# Patient Record
Sex: Female | Born: 1945
Health system: Southern US, Community
[De-identification: ages and names within clinical notes are randomized; demographics above are authoritative.]

## PROBLEM LIST (undated history)

## (undated) DIAGNOSIS — M8580 Other specified disorders of bone density and structure, unspecified site: Secondary | ICD-10-CM

## (undated) DIAGNOSIS — E785 Hyperlipidemia, unspecified: Secondary | ICD-10-CM

## (undated) DIAGNOSIS — I10 Essential (primary) hypertension: Secondary | ICD-10-CM

## (undated) DIAGNOSIS — I639 Cerebral infarction, unspecified: Secondary | ICD-10-CM

## (undated) HISTORY — DX: Hyperlipidemia, unspecified: E78.5

## (undated) HISTORY — DX: Essential (primary) hypertension: I10

## (undated) HISTORY — DX: Other specified disorders of bone density and structure, unspecified site: M85.80

## (undated) HISTORY — DX: Cerebral infarction, unspecified: I63.9

---

## 2000-01-06 ENCOUNTER — Emergency Department (HOSPITAL_COMMUNITY): Admission: EM | Admit: 2000-01-06 | Discharge: 2000-01-06 | Payer: Self-pay | Admitting: Emergency Medicine

## 2000-01-06 ENCOUNTER — Encounter: Payer: Self-pay | Admitting: Emergency Medicine

## 2000-03-13 ENCOUNTER — Ambulatory Visit (HOSPITAL_COMMUNITY): Admission: RE | Admit: 2000-03-13 | Discharge: 2000-03-13 | Payer: Self-pay | Admitting: Orthopedic Surgery

## 2003-09-18 ENCOUNTER — Observation Stay (HOSPITAL_COMMUNITY): Admission: EM | Admit: 2003-09-18 | Discharge: 2003-09-18 | Payer: Self-pay

## 2005-10-09 ENCOUNTER — Emergency Department (HOSPITAL_COMMUNITY): Admission: EM | Admit: 2005-10-09 | Discharge: 2005-10-09 | Payer: Self-pay | Admitting: Family Medicine

## 2007-09-14 DIAGNOSIS — I639 Cerebral infarction, unspecified: Secondary | ICD-10-CM

## 2007-09-14 HISTORY — DX: Cerebral infarction, unspecified: I63.9

## 2007-10-02 ENCOUNTER — Inpatient Hospital Stay (HOSPITAL_COMMUNITY): Admission: EM | Admit: 2007-10-02 | Discharge: 2007-10-07 | Payer: Self-pay | Admitting: Emergency Medicine

## 2007-10-05 ENCOUNTER — Ambulatory Visit: Payer: Self-pay | Admitting: *Deleted

## 2007-10-05 ENCOUNTER — Ambulatory Visit: Payer: Self-pay | Admitting: Physical Medicine & Rehabilitation

## 2007-10-05 ENCOUNTER — Encounter (INDEPENDENT_AMBULATORY_CARE_PROVIDER_SITE_OTHER): Payer: Self-pay | Admitting: Internal Medicine

## 2007-10-07 ENCOUNTER — Inpatient Hospital Stay (HOSPITAL_COMMUNITY)
Admission: RE | Admit: 2007-10-07 | Discharge: 2007-10-20 | Payer: Self-pay | Admitting: Physical Medicine & Rehabilitation

## 2007-10-07 ENCOUNTER — Ambulatory Visit: Payer: Self-pay | Admitting: Physical Medicine & Rehabilitation

## 2007-10-26 ENCOUNTER — Encounter
Admission: RE | Admit: 2007-10-26 | Discharge: 2007-12-24 | Payer: Self-pay | Admitting: Physical Medicine & Rehabilitation

## 2007-11-24 ENCOUNTER — Ambulatory Visit: Payer: Self-pay | Admitting: Family Medicine

## 2007-11-24 DIAGNOSIS — Z8679 Personal history of other diseases of the circulatory system: Secondary | ICD-10-CM | POA: Insufficient documentation

## 2007-11-24 DIAGNOSIS — I10 Essential (primary) hypertension: Secondary | ICD-10-CM | POA: Insufficient documentation

## 2007-11-24 DIAGNOSIS — E785 Hyperlipidemia, unspecified: Secondary | ICD-10-CM | POA: Insufficient documentation

## 2007-11-30 ENCOUNTER — Encounter: Payer: Self-pay | Admitting: Family Medicine

## 2007-11-30 ENCOUNTER — Encounter
Admission: RE | Admit: 2007-11-30 | Discharge: 2007-11-30 | Payer: Self-pay | Admitting: Physical Medicine & Rehabilitation

## 2007-11-30 ENCOUNTER — Ambulatory Visit: Payer: Self-pay | Admitting: Physical Medicine & Rehabilitation

## 2008-01-26 ENCOUNTER — Telehealth: Payer: Self-pay | Admitting: Family Medicine

## 2008-02-01 ENCOUNTER — Ambulatory Visit: Payer: Self-pay | Admitting: Family Medicine

## 2008-02-05 ENCOUNTER — Telehealth: Payer: Self-pay | Admitting: Family Medicine

## 2008-03-22 ENCOUNTER — Ambulatory Visit: Payer: Self-pay | Admitting: Family Medicine

## 2008-03-24 ENCOUNTER — Telehealth: Payer: Self-pay | Admitting: Family Medicine

## 2008-03-24 LAB — CONVERTED CEMR LAB
ALT: 14 units/L (ref 0–35)
BUN: 9 mg/dL (ref 6–23)
Bilirubin, Direct: 0.1 mg/dL (ref 0.0–0.3)
Chloride: 98 meq/L (ref 96–112)
Creatinine, Ser: 0.8 mg/dL (ref 0.4–1.2)
GFR calc Af Amer: 94 mL/min
GFR calc non Af Amer: 78 mL/min
Glucose, Bld: 103 mg/dL — ABNORMAL HIGH (ref 70–99)
Potassium: 4 meq/L (ref 3.5–5.1)
Sodium: 136 meq/L (ref 135–145)
TSH: 3.22 microintl units/mL (ref 0.35–5.50)
Total Protein: 7.2 g/dL (ref 6.0–8.3)

## 2008-04-05 ENCOUNTER — Ambulatory Visit: Payer: Self-pay | Admitting: Family Medicine

## 2008-04-05 LAB — CONVERTED CEMR LAB
Cholesterol: 167 mg/dL (ref 0–200)
HDL: 32.4 mg/dL — ABNORMAL LOW (ref >39.0)

## 2008-04-07 ENCOUNTER — Ambulatory Visit: Payer: Self-pay | Admitting: Family Medicine

## 2008-04-15 ENCOUNTER — Ambulatory Visit: Payer: Self-pay | Admitting: Family Medicine

## 2008-04-20 ENCOUNTER — Encounter: Payer: Self-pay | Admitting: Family Medicine

## 2008-05-12 ENCOUNTER — Ambulatory Visit: Payer: Self-pay | Admitting: Family Medicine

## 2008-05-12 DIAGNOSIS — IMO0001 Reserved for inherently not codable concepts without codable children: Secondary | ICD-10-CM | POA: Insufficient documentation

## 2008-06-16 ENCOUNTER — Ambulatory Visit: Payer: Self-pay | Admitting: Family Medicine

## 2008-06-16 DIAGNOSIS — R5381 Other malaise: Secondary | ICD-10-CM | POA: Insufficient documentation

## 2008-06-16 DIAGNOSIS — R5383 Other fatigue: Secondary | ICD-10-CM | POA: Insufficient documentation

## 2008-06-17 LAB — CONVERTED CEMR LAB
Basophils Absolute: 0 10*3/uL (ref 0.0–0.1)
Basophils Relative: 0.4 % (ref 0.0–3.0)
Eosinophils Absolute: 0.2 10*3/uL (ref 0.0–0.7)
Hemoglobin: 13.3 g/dL (ref 12.0–15.0)
MCHC: 34.9 g/dL (ref 30.0–36.0)
Monocytes Relative: 6 % (ref 3.0–12.0)
Neutro Abs: 6.4 10*3/uL (ref 1.4–7.7)

## 2008-07-28 ENCOUNTER — Ambulatory Visit: Payer: Self-pay | Admitting: Family Medicine

## 2008-07-28 DIAGNOSIS — I491 Atrial premature depolarization: Secondary | ICD-10-CM | POA: Insufficient documentation

## 2008-12-19 ENCOUNTER — Ambulatory Visit: Payer: Self-pay | Admitting: Family Medicine

## 2009-01-11 ENCOUNTER — Telehealth (INDEPENDENT_AMBULATORY_CARE_PROVIDER_SITE_OTHER): Payer: Self-pay | Admitting: *Deleted

## 2009-07-05 ENCOUNTER — Ambulatory Visit: Payer: Self-pay | Admitting: Family Medicine

## 2009-07-10 LAB — CONVERTED CEMR LAB
AST: 22 units/L (ref 0–37)
Basophils Absolute: 0.1 10*3/uL (ref 0.0–0.1)
Basophils Relative: 0.8 % (ref 0.0–3.0)
CO2: 31 meq/L (ref 19–32)
Calcium: 9.3 mg/dL (ref 8.4–10.5)
Chloride: 103 meq/L (ref 96–112)
Eosinophils Relative: 3 % (ref 0.0–5.0)
Glucose, Bld: 94 mg/dL (ref 70–99)
HCT: 38.6 % (ref 36.0–46.0)
HDL: 35.7 mg/dL — ABNORMAL LOW (ref >39.00)
Hemoglobin: 13.3 g/dL (ref 12.0–15.0)
MCHC: 34.4 g/dL (ref 30.0–36.0)
MCV: 89.9 fL (ref 78.0–100.0)
Monocytes Absolute: 0.6 10*3/uL (ref 0.1–1.0)
Monocytes Relative: 6.8 % (ref 3.0–12.0)
Neutro Abs: 5.8 10*3/uL (ref 1.4–7.7)
Platelets: 371 10*3/uL (ref 150.0–400.0)
Potassium: 4 meq/L (ref 3.5–5.1)
RBC: 4.29 M/uL (ref 3.87–5.11)
Triglycerides: 77 mg/dL (ref 0.0–149.0)
VLDL: 15.4 mg/dL (ref 0.0–40.0)

## 2009-07-19 ENCOUNTER — Ambulatory Visit: Payer: Self-pay | Admitting: Internal Medicine

## 2009-07-19 ENCOUNTER — Encounter: Payer: Self-pay | Admitting: Family Medicine

## 2009-08-11 ENCOUNTER — Ambulatory Visit: Payer: Self-pay | Admitting: Family Medicine

## 2009-08-11 DIAGNOSIS — D179 Benign lipomatous neoplasm, unspecified: Secondary | ICD-10-CM | POA: Insufficient documentation

## 2009-08-11 DIAGNOSIS — M949 Disorder of cartilage, unspecified: Secondary | ICD-10-CM

## 2009-08-11 DIAGNOSIS — M899 Disorder of bone, unspecified: Secondary | ICD-10-CM | POA: Insufficient documentation

## 2009-09-18 ENCOUNTER — Telehealth: Payer: Self-pay | Admitting: Family Medicine

## 2009-11-25 ENCOUNTER — Emergency Department (HOSPITAL_COMMUNITY): Admission: EM | Admit: 2009-11-25 | Discharge: 2009-11-25 | Payer: Self-pay | Admitting: Family Medicine

## 2010-02-01 ENCOUNTER — Ambulatory Visit: Payer: Self-pay | Admitting: Internal Medicine

## 2010-02-01 DIAGNOSIS — R42 Dizziness and giddiness: Secondary | ICD-10-CM | POA: Insufficient documentation

## 2010-05-08 ENCOUNTER — Ambulatory Visit: Payer: Self-pay | Admitting: Family Medicine

## 2010-05-08 DIAGNOSIS — J309 Allergic rhinitis, unspecified: Secondary | ICD-10-CM | POA: Insufficient documentation

## 2010-09-13 ENCOUNTER — Telehealth: Payer: Self-pay | Admitting: Family Medicine

## 2010-09-14 ENCOUNTER — Ambulatory Visit: Payer: Self-pay | Admitting: Family Medicine

## 2010-11-13 NOTE — Assessment & Plan Note (Signed)
Summary: congestion//ccm   Vital Signs:  Patient profile:   65 year old female Weight:      172 pounds O2 Sat:      97 % Temp:     98 degrees F Pulse rate:   65 / minute BP sitting:   140 / 82  Vitals Entered By: Pura Spice, RN (September 14, 2010 9:45 AM) CC: cough congeston yellow sputum states notices wheezing at nite   History of Present Illness: Here for one week of PND, chest congestion, and coughing up yellow sputum. No fever.   Allergies: 1)  ! Iodine  Past History:  Past Medical History: Reviewed history from 08/11/2009 and no changes required. Hyperlipidemia Hypertension Cerebrovascular accident, hx of, 12-08 leaving her with some left leg weakness, saw Dr. Vickey Huger fx to left lower leg, no surgery required osteopenia per DEXA 07-19-09  Review of Systems  The patient denies anorexia, fever, weight loss, weight gain, vision loss, decreased hearing, hoarseness, chest pain, syncope, dyspnea on exertion, peripheral edema, headaches, hemoptysis, abdominal pain, melena, hematochezia, severe indigestion/heartburn, hematuria, incontinence, genital sores, muscle weakness, suspicious skin lesions, transient blindness, difficulty walking, depression, unusual weight change, abnormal bleeding, enlarged lymph nodes, angioedema, breast masses, and testicular masses.    Physical Exam  General:  Well-developed,well-nourished,in no acute distress; alert,appropriate and cooperative throughout examination Head:  Normocephalic and atraumatic without obvious abnormalities. No apparent alopecia or balding. Eyes:  No corneal or conjunctival inflammation noted. EOMI. Perrla. Funduscopic exam benign, without hemorrhages, exudates or papilledema. Vision grossly normal. Ears:  External ear exam shows no significant lesions or deformities.  Otoscopic examination reveals clear canals, tympanic membranes are intact bilaterally without bulging, retraction, inflammation or discharge. Hearing is  grossly normal bilaterally. Nose:  External nasal examination shows no deformity or inflammation. Nasal mucosa are pink and moist without lesions or exudates. Mouth:  Oral mucosa and oropharynx without lesions or exudates.  Teeth in good repair. Neck:  No deformities, masses, or tenderness noted. Lungs:  Normal respiratory effort, chest expands symmetrically. Lungs are clear to auscultation, no crackles or wheezes.   Impression & Recommendations:  Problem # 1:  ACUTE BRONCHITIS (ICD-466.0)  Her updated medication list for this problem includes:    Zithromax Z-pak 250 Mg Tabs (Azithromycin) .Marland Kitchen... As directed    Hydromet 5-1.5 Mg/20ml Syrp (Hydrocodone-homatropine) .Marland Kitchen... 1 tsp q 4 hours as needed cough  Complete Medication List: 1)  Norvasc 5 Mg Tabs (Amlodipine besylate) .Marland Kitchen.. 1 by mouth once daily 2)  Aspir-low 81 Mg Tbec (Aspirin) .Marland Kitchen.. 1 once daily after meal 3)  Hydrochlorothiazide 12.5 Mg Tabs (Hydrochlorothiazide) .... Take 1 tab  by mouth every morning 4)  Potassium Chloride 20 Meq Pack (Potassium chloride) .Marland Kitchen.. 1 by mouth once daily 5)  Tenormin 50 Mg Tabs (Atenolol) .... Two times a day 6)  Zithromax Z-pak 250 Mg Tabs (Azithromycin) .... As directed 7)  Hydromet 5-1.5 Mg/25ml Syrp (Hydrocodone-homatropine) .Marland Kitchen.. 1 tsp q 4 hours as needed cough  Patient Instructions: 1)  Please schedule a follow-up appointment as needed .  Prescriptions: HYDROMET 5-1.5 MG/5ML SYRP (HYDROCODONE-HOMATROPINE) 1 tsp q 4 hours as needed cough  #240 x 0   Entered and Authorized by:   Nelwyn Salisbury MD   Signed by:   Nelwyn Salisbury MD on 09/14/2010   Method used:   Print then Give to Patient   RxID:   [id number] ZITHROMAX Z-PAK 250 MG TABS (AZITHROMYCIN) as directed  #1 x 0   Entered and Authorized  by:   Nelwyn Salisbury MD   Signed by:   Nelwyn Salisbury MD on 09/14/2010   Method used:   Print then Give to Patient   RxID:   [id number]    Orders Added: 1)  Est. Patient Level IV [14782]

## 2010-11-13 NOTE — Assessment & Plan Note (Signed)
Summary: dizziness/dm   Vital Signs:  Patient profile:   65 year old female Weight:      169 pounds Temp:     98.3 degrees F oral BP sitting:   140 / 84  (right arm) Cuff size:   regular  Vitals Entered By: Duard Brady LPN (February 01, 2010 2:26 PM) CC: c/o dizziness x1 weeks - no fall no injury noted , c/o nausea but no vomiting Is Patient Diabetic? No   CC:  c/o dizziness x1 weeks - no fall no injury noted  and c/o nausea but no vomiting.  History of Present Illness: a 65 year old patient who presents with a one week history of dizziness.  Dizziness is aggravated by bending over and other movements, such as laying flat in bed.  She briefly has a sense of falling.  She denies any true vertigo.  Associated symptoms include nausea.  Also for the past week.  She has had some sinus congestion and rhinorrhea.  There is been no fever.  She denies any focal neurological symptoms such as dysarthria or diplopia  Preventive Screening-Counseling & Management  Alcohol-Tobacco     Smoking Status: never  Allergies: 1)  ! Iodine  Past History:  Past Medical History: Reviewed history from 08/11/2009 and no changes required. Hyperlipidemia Hypertension Cerebrovascular accident, hx of, 12-08 leaving her with some left leg weakness, saw Dr. Vickey Huger fx to left lower leg, no surgery required osteopenia per DEXA 07-19-09  Review of Systems  The patient denies anorexia, fever, weight loss, weight gain, vision loss, decreased hearing, hoarseness, chest pain, syncope, dyspnea on exertion, peripheral edema, prolonged cough, headaches, hemoptysis, abdominal pain, melena, hematochezia, severe indigestion/heartburn, hematuria, incontinence, genital sores, muscle weakness, suspicious skin lesions, transient blindness, difficulty walking, depression, unusual weight change, abnormal bleeding, enlarged lymph nodes, angioedema, and breast masses.    Physical Exam  General:   Well-developed,well-nourished,in no acute distress; alert,appropriate and cooperative throughout examination Head:  Normocephalic and atraumatic without obvious abnormalities. No apparent alopecia or balding. Eyes:  No corneal or conjunctival inflammation noted. EOMI. Perrla. Funduscopic exam benign, without hemorrhages, exudates or papilledema. Vision grossly normal. Ears:  External ear exam shows no significant lesions or deformities.  Otoscopic examination reveals clear canals, tympanic membranes are intact bilaterally without bulging, retraction, inflammation or discharge. Hearing is grossly normal bilaterally. Mouth:  Oral mucosa and oropharynx without lesions or exudates.  Teeth in good repair. Neck:  No deformities, masses, or tenderness noted. Heart:  Normal rate and regular rhythm. S1 and S2 normal without gallop, murmur, click, rub or other extra sounds. Neurologic:  alert & oriented X3, cranial nerves II-XII intact, gait normal, and finger-to-nose normal.     Impression & Recommendations:  Problem # 1:  DIZZINESS (ICD-780.4) sounds most consistent with some mild vertigo.  Will treat with Clarinex, and clinically observed  Problem # 2:  HYPERTENSION (ICD-401.9)  Her updated medication list for this problem includes:    Norvasc 5 Mg Tabs (Amlodipine besylate) .Marland Kitchen... 1 by mouth once daily    Hydrochlorothiazide 12.5 Mg Tabs (Hydrochlorothiazide) .Marland Kitchen... Take 1 tab  by mouth every morning    Tenormin 50 Mg Tabs (Atenolol) .Marland Kitchen..Marland Kitchen Two times a day  Complete Medication List: 1)  Norvasc 5 Mg Tabs (Amlodipine besylate) .Marland Kitchen.. 1 by mouth once daily 2)  Aspir-low 81 Mg Tbec (Aspirin) .Marland Kitchen.. 1 once daily after meal 3)  Hydrochlorothiazide 12.5 Mg Tabs (Hydrochlorothiazide) .... Take 1 tab  by mouth every morning 4)  Potassium Chloride 20 Meq Pack (  Potassium chloride) .Marland Kitchen.. 1 by mouth once daily 5)  Tenormin 50 Mg Tabs (Atenolol) .... Two times a day 6)  Flexeril 10 Mg Tabs (Cyclobenzaprine hcl)  .... Three times a day as needed spasm 7)  Diclofenac Sodium 50 Mg Tbec (Diclofenac sodium) .... Three times a day as needed pain  Patient Instructions: 1)  Please schedule a follow-up appointment as needed. 2)  Limit your Sodium (Salt) to less than 2 grams a day(slightly less than 1/2 a teaspoon) to prevent fluid retention, swelling, or worsening of symptoms. 3)  It is important that you exercise regularly at least 20 minutes 5 times a week. If you develop chest pain, have severe difficulty breathing, or feel very tired , stop exercising immediately and seek medical attention. 4)  Check your Blood Pressure regularly. If it is above: 150/90 you should make an appointment.

## 2010-11-13 NOTE — Assessment & Plan Note (Signed)
Summary: throat drainage especially at night/loss of appetite/some nau...   Vital Signs:  Patient profile:   65 year old female Weight:      170 pounds BMI:     29.29 Temp:     98.0 degrees F oral BP sitting:   130 / 74  (left arm) Cuff size:   regular  Vitals Entered By: Raechel Ache, RN (May 08, 2010 3:48 PM) CC: C/o post-nasal drip since May.   History of Present Illness: Here for 2 months of intermittent PND and a scratchy sensation in the back of the throat. No fever or HA or cough. This is similar to the symtpoms she saw [place] for in April of this year. At that time she responded well to samples for Clarinex. She took 10 days worth and then stopped.   Allergies: 1)  ! Iodine  Past History:  Past Medical History: Reviewed history from 08/11/2009 and no changes required. Hyperlipidemia Hypertension Cerebrovascular accident, hx of, 12-08 leaving her with some left leg weakness, saw Dr. Vickey Huger fx to left lower leg, no surgery required osteopenia per DEXA 07-19-09  Review of Systems  The patient denies anorexia, fever, weight loss, weight gain, vision loss, decreased hearing, hoarseness, chest pain, syncope, dyspnea on exertion, peripheral edema, prolonged cough, headaches, hemoptysis, abdominal pain, melena, hematochezia, severe indigestion/heartburn, hematuria, incontinence, genital sores, muscle weakness, suspicious skin lesions, transient blindness, difficulty walking, depression, unusual weight change, abnormal bleeding, enlarged lymph nodes, angioedema, breast masses, and testicular masses.    Physical Exam  General:  Well-developed,well-nourished,in no acute distress; alert,appropriate and cooperative throughout examination Head:  Normocephalic and atraumatic without obvious abnormalities. No apparent alopecia or balding. Eyes:  No corneal or conjunctival inflammation noted. EOMI. Perrla. Funduscopic exam benign, without hemorrhages, exudates or papilledema. Vision  grossly normal. Ears:  External ear exam shows no significant lesions or deformities.  Otoscopic examination reveals clear canals, tympanic membranes are intact bilaterally without bulging, retraction, inflammation or discharge. Hearing is grossly normal bilaterally. Nose:  External nasal examination shows no deformity or inflammation. Nasal mucosa are pink and moist without lesions or exudates. Mouth:  Oral mucosa and oropharynx without lesions or exudates.  Teeth in good repair. Neck:  No deformities, masses, or tenderness noted. Lungs:  Normal respiratory effort, chest expands symmetrically. Lungs are clear to auscultation, no crackles or wheezes.   Impression & Recommendations:  Problem # 1:  ALLERGIC RHINITIS (ICD-477.9)  Complete Medication List: 1)  Norvasc 5 Mg Tabs (Amlodipine besylate) .Marland Kitchen.. 1 by mouth once daily 2)  Aspir-low 81 Mg Tbec (Aspirin) .Marland Kitchen.. 1 once daily after meal 3)  Hydrochlorothiazide 12.5 Mg Tabs (Hydrochlorothiazide) .... Take 1 tab  by mouth every morning 4)  Potassium Chloride 20 Meq Pack (Potassium chloride) .Marland Kitchen.. 1 by mouth once daily 5)  Tenormin 50 Mg Tabs (Atenolol) .... Two times a day 6)  Flexeril 10 Mg Tabs (Cyclobenzaprine hcl) .... Three times a day as needed spasm 7)  Diclofenac Sodium 50 Mg Tbec (Diclofenac sodium) .... Three times a day as needed pain  Patient Instructions: 1)  Try Claritin or Zyrtec OTC.  2)  Please schedule a follow-up appointment as needed .

## 2010-11-13 NOTE — Progress Notes (Signed)
  Phone Note Call from Patient   Caller: Patient Call For: Nelwyn Salisbury MD Summary of Call: [phone] [facility] Pt is having URI symptoms and post nasal drainage. No fever or productive cough.  Wants RX. Initial call taken by: Lynann Beaver CMA AAMA,  September 13, 2010 9:23 AM  Follow-up for Phone Call        needs an OV  Follow-up by: Nelwyn Salisbury MD,  September 13, 2010 10:12 AM  Additional Follow-up for Phone Call Additional follow up Details #1::        Left message to make appt. Additional Follow-up by: Lynann Beaver CMA AAMA,  September 13, 2010 10:28 AM    Additional Follow-up for Phone Call Additional follow up Details #2::    Pt made appt tomorrow with Dr. Clent Ridges. Follow-up by: Lynann Beaver CMA AAMA,  September 13, 2010 12:57 PM

## 2010-12-21 ENCOUNTER — Other Ambulatory Visit: Payer: Self-pay | Admitting: Family Medicine

## 2011-01-17 ENCOUNTER — Telehealth: Payer: Self-pay | Admitting: *Deleted

## 2011-01-17 NOTE — Telephone Encounter (Signed)
agreed

## 2011-01-17 NOTE — Telephone Encounter (Signed)
Pt was taking Claritin and it was helping her post nasal drip.  Allergic to vanilla fragrance, and was exposed to vanilla with headache.  Advised to continue Claritin since she has no reason to believe there is any reaction to Claritin.  Will call back with any unusual symptoms.

## 2011-02-26 NOTE — Consult Note (Signed)
NAMEJHADA, RISK              ACCOUNT NO.:  [id number]   MEDICAL RECORD NO.:  [id number]          PATIENT TYPE:  EMS   LOCATION:  [facility]                         FACILITY:  [facility]   PHYSICIAN:  Melvyn Novas, M.D.  DATE OF BIRTH:  [birth year]   DATE OF CONSULTATION:  DATE OF DISCHARGE:                                 CONSULTATION   REASON FOR CONSULTATION:  Dizziness.   HISTORY OF PRESENT ILLNESS:  Ms. Spells is a 65 year old woman who is a  previous patient of Dr. Vickey Huger.  She has received very little primary  care over the past few years.  She currently has poorly-controlled  hypertension.  She presents to the emergency room today with a history  of nausea and vomiting for the past 2 days.  She also has had several  episodes of diarrhea.  She states that this morning after returning from  the toilet that she had an episode of dizziness and felt as if she were  going to fall.  She was staggering.  She did not have any visual  changes.  She did not have any loss of consciousness.  Her daughters are  currently at the bedside.  Of particular note, Ms. Wassel has been seen  on 2 other occasions, each in December over the past 5 years.  Usually  she comes in with vague neurological complaints.  She does not have a  formal diagnosis of depression.   PAST MEDICAL HISTORY:  1. Hypertension.  2. Medical noncompliance.   SOCIAL HISTORY:  Nondrinker, nonsmoker.   FAMILY HISTORY:  Positive for hypertension.   MEDICATIONS:  None.   REVIEW OF SYSTEMS:  Negative except for what is mentioned in the HPI.  Fever, chills for 3 days, diarrhea for 24 hours.  Did not take  antihypertensive and has not been taking them for several months to  years.  She now has systolic blood pressure of 196/110.  This lady is  not neurologically ill.   VITAL SIGNS:  Blood pressure 196/110, heart rate 96, normal  respirations.  O2 saturations 100% on room air.   LABORATORIES:  White blood cell count  elevated at 14.4, hemoglobin 14.4,  platelets 385.  Potassium decreased at 3.3, consistent with her  diarrheal illness.  UA was negative for infection, however did have  significant proteinuria secondary to her hypertension.   PHYSICAL EXAM:  The patient is alert but depressed-appearing.  Her  daughters are at the bedside.  She has no gross neurological  abnormalities.  No motor deficits.  Cranial nerves normal.  No sensory  deficits.  Deep tendon reflexes are hyperreflexic.  Downgoing toes.  Heel-to-shin normal.  Finger-to-nose bilaterally intact.   ASSESSMENT:  No neurological illness.  The patient has orthostatic  changes consistent with her fluid loss secondary to gastroenteritis with  a concurrent hypokalemia.  Her blood pressure is increased even in the  setting of decreased volume status.  She is a severely hypertensive  patient, which needs treatment at this time.  Please consult internal  medicine for further management.      Edsel Petrin, D.O.  Electronically Signed      Melvyn Novas, M.D.  Electronically Signed    ELG/MEDQ  D:  10/02/2007  T:  10/02/2007  Job:  [id number]

## 2011-02-26 NOTE — Discharge Summary (Signed)
NAMEJAMES, LAFALCE NO.:  [id number]   MEDICAL RECORD NO.:  [id number]          PATIENT TYPE:  INP   LOCATION:  3033                         FACILITY:  [facility]   PHYSICIAN:  Isidor Holts, M.D.  DATE OF BIRTH:  26-Jun-1946   DATE OF ADMISSION:  10/02/2007  DATE OF DISCHARGE:  10/07/2007                               DISCHARGE SUMMARY   ADDENDUM:  For Discharge Diagnoses, Procedures, Consultations,  Admission History, and Clinical Course, refer to Interim Summary  dictated October 06, 2007, by Dr. Elliot Cousin.   In addition, on October 07, 2007, there no new issues.  The patient was  comfortable, asymptomatic.  Physical examination showed no new physical  findings.  Blood pressure was well controlled at 107/79, and the patient  continues on a statin  for dyslipidemia.  The left ear inflammation is  improving steadily clinically, and the patient is due to complete  Augmentin on October 11, 2007.  The patient's hypokalemia is likely  secondary to hydrochlorothiazide,  and she continues on potassium  chloride supplements. Serum potassium was 3.6 on October 07, 2007.  The  patient has been seen by the rehab team and confirmed for transfer to  inpatient rehabilitation on October 07, 2007.  She has, therefore, been  discharged accordingly from the medical service.      Isidor Holts, M.D.  Electronically Signed     CO/MEDQ  D:  10/07/2007  T:  10/07/2007  Job:  [id number]

## 2011-02-26 NOTE — Discharge Summary (Signed)
Anne [name], BETSILL NO.:  [id number]   MEDICAL RECORD NO.:  [id number]          PATIENT TYPE:  INP   LOCATION:  3033                         FACILITY:  [facility]   PHYSICIAN:  Elliot Cousin, M.D.    DATE OF BIRTH:  26-Apr-1946   DATE OF ADMISSION:  10/01/2007  DATE OF DISCHARGE:                               DISCHARGE SUMMARY   Anticipated date of discharge October 07, 2007.   DISCHARGE DIAGNOSES:  1. Acute right brain stroke (acute infarct in the right lateral      thalamus and posterior limb of the internal capsule per MRI of the      brain on October 02, 2007).  2. Left-sided hemiparesis secondary to above.  3. Malignant hypertension secondary to noncompliance.  4. Chronic hypokalemia.  5. Transient hyperglycemia.  6. Mild left external ear canal cellulitis.  7. Abnormal EKG changes.  8. Hyperlipidemia.   DISCHARGE MEDICATIONS:  1. Potassium chloride 20 mEq b.i.d.  2. Hydrochlorothiazide 12.5 mg daily.  3. Toprol-XL 50 mg daily.  4. Norvasc 5 mg nightly.  5. Augmentin 500 mg b.i.d. for a total of 7 days.  6. Aspirin 81 mg daily.  7. Zocor 20 mEq nightly.   CONSULTATIONS:  1. Melvyn Novas, MD.  2. Dr. Thersa Salt.  3. Physical and occupational therapists.   PROCEDURES PERFORMED:  1. MRI of the brain on October 02, 2007.  The results are above.  2. MRA of the head, negative.  3. CT scan of the head without contrast on October 02, 2007.  The      results revealed no CT evidence of acute infarction.  Focal lacunar-      type infarctions in the right basal ganglia and white matter are      age-indeterminate, but new compared to September 18, 2003.  4. 2-D echocardiogram  performed on October 05, 2007.  The results      revealed overall left ventricular systolic function was normal.      Ejection fraction estimated to be 60% to 65%.  No evidence of left      ventricular regional wall motion abnormalities.  There was mild      focal basal septal  hypertrophy.  Mild aortic valvular      regurgitation.  No diagnostic echocardiographic evidence for a      cardiac source of embolism.  5. Carotid Doppler study performed on October 05, 2007.  The results      revealed mild noncalcific plaque at the proximal internal carotid      artery, vertebral artery flow antegrade bilaterally.  No      significant right internal carotid artery stenosis.  No significant      left internal carotid artery stenosis.   HISTORY OF PRESENT ILLNESS:  The patient is a 65 year old woman with a  past medical history significant for hypertension, who presented to the  emergency department on October 02, 2007 with a chief complaint of  nausea, dizziness, and weakness on the left side.  She was initially  evaluated in the emergency department by neurologist Dr. Vickey Huger.  Dr.  Dohmeier evaluated the patient and felt that her symptoms were secondary  to a gastroenteritis with concurrent hypokalemia.  She believed that  there was no neurological illness.  A CT scan of the head was performed  in the emergency department, and revealed no evidence for an acute  infarction, however, there was evidence of lacunar-type infarctions in  the right basal ganglia that were age indeterminate, but new compared to  the CT scan performed September 18, 2003.  During the evaluation in the  emergency department, the patient was noted to be very hypertensive with  a blood pressure of 215/127.  The patient was, therefore, admitted for  further evaluation and management.   For additional details, please see the dictated history and physical.   HOSPITAL COURSE:  1. ACUTE RIGHT BRAIN STROKE WITH LEFT-SIDED HEMIPARESIS.  The patient      was started on an aspirin daily.  Measures were taken to control      her blood pressure.  For further evaluation, an MRI of the brain,      MRA of the head, 2-D echocardiogram, and a carotid Doppler study      were ordered.  As indicated above, the  MRI of the brain revealed an      acute infarction in the right lateral thalamus and posterior limb      of the internal capsule.  The echocardiogram revealed an ejection      fraction ranging from 60% to 65%.  The carotid Doppler study      revealed no ICA stenosis.  The patient underwent a bedside swallow      evaluation by the nursing staff, and apparently she passed it.      During my exam, the patient did have an intact gag reflex, and no      obvious cranial nerve deficit.  She was, however, hemiparetic on      the left, per my assessment initially.  Over the course of the      hospitalization, she has become stronger, however, greater of the      left upper extremity more than the left lower extremity.  The      physical and occupational therapists were consulted.  Per their      assessment, the patient would be a good candidate for      rehabilitation at [facility].  Therefore, Dr. Thersa Salt was      consulted.  He agreed that the patient would benefit from short-      term rehabilitation.  Currently, the patient is stable.  Rehab      admission is pending because of the wait for insurance approval.      If the patient's insurance does not pay for the rehab admission at      [facility], the alternative would be for the patient to go      to a short-term nursing facility for rehab and/or for the patient      to be discharged to home with home health therapy and family      assistance.  2. MALIGNANT HYPERTENSION.  The patient was diagnosed with      hypertension several years ago, however, she admittedly has been      noncompliant.  During her stay in the emergency department, her      systolic blood pressure reached a high of 215, and her diastolic      blood pressure reached a high of 127.  The  emergency department      physician ordered 20 mg of labetalol, which did improve her blood      pressure a little.  Her EKG at the time of the initial hospital       assessment revealed a left anterior fascicular block, LVH, right      atrial enlargement, and a prolonged Q-T interval.  The patient was      started on treatment with hydrochlorothiazide, Toprol-XL and      Norvasc.  Clonidine was added p.r.n. as well as intravenous      hydralazine for systolic blood pressures greater than 190, and      diastolic blood pressures greater than 100.  Cardiac enzymes were      ordered and were within normal limits.  Her TSH was assessed and      was also within normal limits at 2.29.  A followup EKG after her      blood pressures became more controlled revealed normal sinus rhythm      with PACs, left axis deviation, and LVH.  For the previous      prolonged Q-T interval, a magnesium level was assessed and found to      be within normal limits at 2.1.  Over the past few days, the      patient's blood pressure has improved significantly.  She will be      maintained on the current regimen with the exception of  decreasing      the hydrochlorothiazide to 12.5 mg daily.  The patient was      encouraged to start becoming compliant with antihypertensive      medications.  3. HYPERLIPIDEMIA.  The patient's fasting lipid panel was assessed,      and the results were as follows:  total cholesterol 226,      triglycerides 132, HDL 38, and LDL 162.  The patient was started on      Zocor 20 mg nightly.  4. TRANSIENT HYPERGLYCEMIA.  At the time of the initial hospital      assessment, the patient's venous glucose was 117.  However, the      followup glucose increased to 157.  It is unclear whether or not      the venous glucose was fasting or after eating.  Nonetheless, a      hemoglobin A1c was ordered and found to be within normal limits at      6.  Over the course of the hospitalization, her venous glucose      normalized.  As of today, it is 113.  5. HYPOKALEMIA.  The patient's serum potassium was 3.3 on admission.      She was repleted with potassium chloride  orally.  Her magnesium      level was assessed and found to be within normal limits.  Over the      course of the hospitalization, the patient's serum potassium      remained at 3.3.  In part, the hypokalemia has been secondary to      hydrochlorothiazide therapy.  For this reason, the patient will be      started on b.i.d. dosing of potassium chloride.  6. MILD LEFT EXTERNAL EAR CANAL CELLULITIS.  The patient's white blood      cell count on admission was 14.4.  Her chest x-ray revealed no      acute disease, and her urinalysis revealed no white blood cells.      She did  complain of a left earache.  On exam, there were no obvious      tympanic membrane abnormalities, however, there were some      cellulitic changes of the external canal.  For this reason,      Augmentin at 500 mg b.i.d. was started yesterday.  As of today, her      white blood cell count has improved to 11.7.  She has been      completely afebrile during the hospital course.   DISCHARGE DISPOSITION:  The patient is currently stable and in improved  condition.  Discharge to the rehab unit at [facility] is  currently pending.  If her insurance does not approve the admission to  the rehab unit at [facility], the patient may require short-  term skilled nursing facility placement.      Elliot Cousin, M.D.  Electronically Signed     DF/MEDQ  D:  10/06/2007  T:  10/06/2007  Job:  [id number]

## 2011-02-26 NOTE — H&P (Signed)
NAMEAUBRIEL, Anne [name]              ACCOUNT NO.:  [id number]   MEDICAL RECORD NO.:  [id number]          PATIENT TYPE:  INP   LOCATION:  1830                         FACILITY:  [facility]   PHYSICIAN:  Elliot Cousin, M.D.    DATE OF BIRTH:  Mar 16, 1946   DATE OF ADMISSION:  10/02/2007  DATE OF DISCHARGE:                              HISTORY & PHYSICAL   PRIMARY CARE PHYSICIAN:  The patient is unassigned.   CHIEF COMPLAINT:  Nausea, dizziness, weakness on the left side.   HISTORY OF PRESENT ILLNESS:  The patient is a 65 year old woman with a  past medical history significant for hypertension, who presents to the  emergency department with a chief complaint of nausea, dizziness, and  weakness on the left side.  She woke up this morning at approximately 6  a.m. nauseated.  When she proceeded to get up to go to the bathroom, she  became very dizzy.  She acknowledges that she almost fell, however, she  did not.  The dizziness lasted for several hours.  She denies outright  vertigo; however, she feels off balance when she stands.  She has also  associated left-sided weakness particularly of her left leg.  She says  that her left leg is weaker than her left arm.  She is right-handed and  denies any right-sided symptoms.  She denies headache, visual changes,  difficulty speaking, difficulty swallowing, difficulty chewing, or  facial droop.  Her sisters are present and acknowledge that they have  not observed slurred speech, facial droop, or mental status changes.  The patient also states that she does have a numbness type feeling over  the left side of her body.  She has also had some transient diarrhea  twice.  She has had nausea but no vomiting.  She denies subjective fever  or chills.  She denies chest pain, shortness of breath or cough.  She  denies painful urination.  She denies pain in her legs or unusual  swelling in her legs.  She acknowledges that she has a history of  hypertension for  many years; however, she has not taken any  antihypertensive medications in at least 4 or 5 years.   During the evaluation in the emergency department, the patient is noted  to be hypertensive with a blood pressure of 215/127.  Neurologist, Dr.  Vickey Huger, evaluated the patient and, per her assessment, there is no  neurological illness.  Dr. Vickey Huger believes that the patient has had  orthostatic changes consistent with fluid loss secondary to a  gastroenteritis with concurrent hypokalemia.  The patient's CT scan of  the head reveals evidence of lacunar type infarctions at the right basal  ganglia, new from 2004; however, there are no acute intracranial  findings.  Her EKG reveals left anterior fascicular block, LVH, right  atrial enlargement, and prolonged QT interval of 408/513.  The patient  will be admitted for further evaluation and management.   PAST MEDICAL HISTORY:  1. Hypertension.  The patient has been noncompliant with medications.      She has not seen a primary care physician in several  years.  2. Status post myomectomy in 1985.  3. REPORTED ALLERGY OF LISINOPRIL PER DATA FROM 2004.  THE PATIENT      DOES NOT RECALL BEING ALLERGIC TO LISINOPRIL.  4. SHELLFISH/IODINE ALLERGY.   MEDICATIONS:  Centrum multivitamin once daily.   ALLERGIES:  THE PATIENT HAS ALLERGIES TO:  1. SHELLFISH.  2. IODINE PRODUCTS.  3. POSSIBLY LISINOPRIL.   SOCIAL HISTORY:  1. The patient is single.  2. [place]  3. She has no children.  4. She is employed as a Human resources officer at [employer].  5. She denies tobacco and illicit drug use.  6. She does rarely drink alcohol socially.   FAMILY HISTORY:  1. Her mother died of congestive heart failure at 64 years of age.  2. Her father died of bone cancer at 53 years of age.  3. She has several sisters, one of which has hypertension.   REVIEW OF SYSTEMS:  As above in History of Present Illness.    EXAM:  Temperature 97, blood pressure 215/127, repeated at 193/105,  pulse 99, respiratory rate 22, oxygen saturation 95% on room air.  GENERAL:  The patient is a pleasant 65 year old African-American woman  who is currently sitting up in bed in no acute distress.  HEENT:  Head is normocephalic, nontraumatic.  Pupils are equal, round,  reactive to light, extraocular movements are intact.  Conjunctivae are  clear, sclerae are white, tympanic membranes are clear bilaterally.  Nasal mucosa is mildly dry, no sinus tenderness.  Oropharynx reveals  fair dentition.  mucous membranes are mildly dry.  No posterior exudates  or erythema.  NECK:  Supple, no adenopathy, no thyromegaly, no bruit, no JVD.  LUNGS:  Clear to auscultation bilaterally.  HEART:  S1 S2 with a soft systolic murmur.  ABDOMEN:  Mildly obese, positive bowel sounds, soft, nontender,  nondistended.  No hepatosplenomegaly, no masses, palpated.  EXTREMITIES:  Pedal pulses palpable bilaterally.  No pretibial edema and  no pedal edema.  NEUROLOGIC:  The patient is alert and oriented x3, cranial nerves II-XII  are intact.  Strength of the right upper and right lower extremities is  5/5.  Strength of the left upper extremity is 5-/5 and strength of the  left lower extremity is 4+/5.  Cerebellar with finger-to-nose testing is  intact.  Plantar reflexes downgoing bilaterally.  Sensation is grossly  intact throughout.   ADMISSION LABORATORIES:  Chest x-ray reveals no acute disease.  EKG and  CT scan results are above.  Urinalysis reveals 15 ketones, nitrite  negative, leukocyte negative, creatinine 0.7.  BUN 9, sodium 136,  potassium 3.3, chloride 102, glucose 117, bicarbonate 28.  WBC 14.4,  hemoglobin 14.4, platelets 385,000.   ASSESSMENT:  1. Nausea, dizziness, and objective left-sided hemiparesis.  There is      evidence of old right basal ganglia infarctions on the CT scan of      the head.  She is certainly at risk for further  strokes given her      malignant hypertension.  The patient will be admitted to rule out      an acute stroke versus transient ischemic attack.  2. Possible transient gastroenteritis.  The patient's nausea,      vomiting, and loose stools have been short term.  She does not have      a fever; however, her white blood cell count is marginally      elevated.  3. Malignant hypertension.  The  patient has been noncompliant with      medical therapy.  4. Leukocytosis.  The patient's white blood cell count is 14.4.  She      has no evidence of pneumonia or an urinary tract infection.  The      leukocytosis may be reactive as a consequence of malignant      hypertension.  5. Hypokalemia.  The patient's serum potassium is 3.3.  6. Abnormal EKG.  The patient's EKG reveals a prolonged QT interval,      left anterior fascicular block, and left ventricular hypertrophy.      These findings are not surprising given the untreated malignant      hypertension.  The patient does not complain of chest pain at this      time.   PLAN:  1. The patient will be admitted for further evaluation and management.  2. For further evaluation, will check an MRI of the brain, MRA of the      head, 2D echocardiogram, carotid Dopplers, cardiac enzymes, and a      TSH.  Will assess the patient's magnesium level.  3. Will start Toprol-XL, Norvasc, and hydrochlorothiazide.  Will also      add hydralazine IV p.r.n.  4. Will replete potassium chloride.  5. Will consult the physical and occupational therapists.  6. Nutrition education on a heart healthy/low sodium diet.      Elliot Cousin, M.D.  Electronically Signed     DF/MEDQ  D:  10/02/2007  T:  10/02/2007  Job:  [id number]

## 2011-02-26 NOTE — Discharge Summary (Signed)
Anne [name], Anne [name]              ACCOUNT NO.:  [id number]   MEDICAL RECORD NO.:  [id number]          PATIENT TYPE:  IPS   LOCATION:  4032                         FACILITY:  [facility]   PHYSICIAN:  Ellwood Dense, M.D.   DATE OF BIRTH:  Mar 17, 1946   DATE OF ADMISSION:  10/07/2007  DATE OF DISCHARGE:  10/20/2007                               DISCHARGE SUMMARY   DISCHARGE DIAGNOSES:  1. Right lateral thalamus and posterior limb internal capsule      cerebrovascular accident.  2. Hypertension.  3. Hyperlipidemia.  4. History of poor medical compliance.  5. Left external ear cellulitis, resolved.   This is a 65 year old African American female with history of  hypertension, poor medical compliance admitted October 02, 2007, with  dizziness, nausea, vomiting.  MRI showed acute infarction right lateral  thalamus and posterior limb internal capsule.  MRA negative for  stenosis.  Echocardiogram with ejection fraction 60-65% without thrombi.  Carotid Dopplers negative for internal carotid artery stenosis.  Neurology services placed on aspirin therapy.  Blood pressure monitored  with Norvasc, hydrochlorothiazide and Toprol.  Placed on Augmentin  October 05, 2007, for mild left external ear canal cellulitis.   PAST MEDICAL HISTORY:  See discharge diagnoses.   ALLERGIES:  SHELL FISH, IODINE and questionable to LISINOPRIL.   No tobacco.  Occasional alcohol.   SOCIAL HISTORY:  Lives with nephew.  She is employed with [employer].  Nephew works. No children.  One level home, six steps  to entry.   MEDICATION PRIOR TO ADMISSION:  Multivitamin.   REHABILITATION HOSPITAL COURSE:  The patient was admitted to inpatient  rehab services with therapies initiated on a 3-hour daily basis  consisting of physical therapy, occupational therapy, speech therapy and  rehabilitation nursing.  The following issues were addressed during the  patient's rehabilitation stay.  Pertaining to Ms.  Lockamy' right lateral  thalamus posterior limb internal capsule cerebrovascular accident,  remained stable.  Maintained on aspirin therapy.  Overall supervision  for bathing, upper and lower body dressing, toileting and toilet  transfers, minimal assist to supervision for mobility, continent of  bowel and bladder.  No unsafe behavior noted.  She was advised no  driving.  Her blood pressure was monitored on multiple hypertensive  medications of Norvasc, hydrochlorothiazide and Toprol.  Diastolic  pressure 75-86.  Case management was to help assist in locating a  primary MD prior to the patient's departure from the hospital.  It was  noted she had a history of poor medical compliance in the past.  Zocor  was added during her hospital regimen for a cholesterol of 226 and again  she was instructed on low-fat diets.  She was discharged to home with  family.   Latest labs showed a hemoglobin 13, hematocrit 32.4, platelet 379,000.  Sodium 136, potassium 3.3, BUN 14, creatinine 0.2.   DISCHARGE MEDICATIONS:  At the time of dictation included  1. Norvasc 5 mg daily.  2. Aspirin 81 mg daily.  3. Hydrochlorothiazide 12.5 mg daily.  4. Toprol XL 50 mg daily.  5. Zocor 20 mg daily.  6. Potassium chloride 20 mEq daily.  7. She was on subcutaneous Lovenox during her rehab course.  This was      discontinued at time of discharge.   DIET:  Regular.   SPECIAL INSTRUCTIONS:  No drinking, no driving, no smoking.   FOLLOW-UP:  Dr. Ellwood Dense at the outpatient rehab center.  Case  management to help locate primary MD to follow the patient on discharge      Anne [name], P.A.    ______________________________  Ellwood Dense, M.D.    DA/MEDQ  D:  10/19/2007  T:  10/19/2007  Job:  [id number]   cc:   Bevelyn Buckles. Nash Shearer, M.D.

## 2011-02-26 NOTE — Assessment & Plan Note (Signed)
Anne [name] returns to clinic today for a followup evaluation.  She is a  65 year old African-American female with a history of hypertension and  poor medical compliance.  She was admitted October 02, 2007 with  dizziness, nausea, and vomiting.  MRI studies showed an acute infarction  in the right lateral thalamus and posterior limb of the internal  capsule.  MRA study was negative for stenosis.  Echocardiogram showed an  ejection fraction of 60% to 65% without thrombi.  Carotid Dopplers were  negative for internal carotid artery stenosis.  The patient was placed  on aspirin by the stroke service.  Her blood pressure is monitored on  Norvasc, hydrochlorothiazide, and Toprol.   The patient moved to the rehabilitation unit October 07, 2007, and  remained there through discharge October 20, 2007.   Since discharge, the patient has been living at her home.  She is  attending outpatient physical and occupational therapy twice a week at  the [facility] address and has a few more appointments.  She has had  a primary care physician previously in [place] by the name of Dr.  Hyacinth Meeker.  She is living in [place] now and was set up with a primary  care physician at the time of the discharge from the hospital.  She did  see that new physician, Dr. Abran Cantor, last week.  She is due to follow up  with him in approximately 3 weeks for monitoring of her cholesterol.  The patient needs to decide between the primary care physicians.  She is  presently living in [place], and it makes sense that she would go  into [facility] for acute care.  If she does not set up a  primary care physician locally, then, really, no one will have medical  records on her.  If she plans to keep Dr. Hyacinth Meeker as her primary care  physician, then it makes more sense for her to be living closer to Dr.  Hyacinth Meeker in [facility].  I have given her these options and she is in the  process of trying to make a decision.   In terms of her bilateral upper extremities, she still has a slight  weakness and dyscoordination of her left hand, but otherwise is  independent with ambulation using a single-point cane.  She is  independent with bathing and dressing.   MEDICATIONS:  1. Norvasc 5 mg daily.  2. Aspirin 81 mg daily.  3. Hydrochlorothiazide 12.5 mg daily.  4. Zocor 20 mg daily.  5. Atenolol 50 mg daily.  6. Potassium chloride 20 mEq daily.   REVIEW OF SYSTEMS:  Noncontributory.   PHYSICAL EXAMINATION:  A well-appearing, middle-aged adult female in no  acute discomfort.  Blood pressure is 135/82, with a pulse of 60, respiratory rate 18, and  O2 saturation 100% on room air.  The patient has 4+/5 strength throughout the bilateral upper and lower  extremities.  Bulk and tone were normal.  She ambulates with a single-  point cane.   IMPRESSION:  1. Status post right thalamic and posterior limb internal capsule      ischemic infarct.  2. Hypertension.  3. Dyslipidemia.  4. History of poor medical compliance.   In the office today, we did refill the patient's Norvasc along with her  Zocor and hydrochlorothiazide.  She has a sufficient supply of atenolol.  We will plan on seeing her in followup on an as-needed basis.  She is  referred back to whichever primary  care physician she wishes to see.  Will plan on seeing her in followup on an as-needed basis.           ______________________________  Ellwood Dense, M.D.     DC/MedQ  D:  11/30/2007 14:24:00  T:  12/01/2007 09:43:33  Job #:  [id number]

## 2011-02-26 NOTE — H&P (Signed)
Anne [name], POLLINO NO.:  [id number]   MEDICAL RECORD NO.:  [id number]          PATIENT TYPE:  IPS   LOCATION:  4032                         FACILITY:  [facility]   PHYSICIAN:  Ellwood Dense, M.D.   DATE OF BIRTH:  December 01, 1945   DATE OF ADMISSION:  10/07/2007  DATE OF DISCHARGE:                              HISTORY & PHYSICAL   PRIMARY CARE PHYSICIAN:  None.   NEUROLOGIST:  Dr. Nash Shearer.   HISTORY OF PRESENT ILLNESS:  Anne [name] is a 65 year old African  American female with history of hypertension and poor medical  compliance.  The patient was admitted 10/02/2007 with dizziness along with nausea and  vomiting.  MRI study showed an acute infarct in the right lateral  thalamus and posterior limb of the internal capsule.  MRA study was  negative for stenosis.  Echocardiogram showed an ejection fraction of  60%-65% without evidence of thrombi.  Carotid Doppler showed no internal  carotid artery stenosis.  Dr. Vickey Huger from neurology recommended  aspirin therapy daily.  Her blood pressures been monitored on Norvasc,  hydrochlorothiazide and Toprol.  Total cholesterol was elevated to 226  and Zocor was added.  She was placed on Augmentin as of 10/05/2007 for  mild left external ear canal cellulitis.   The patient was evaluated by the rehabilitation physicians and felt to  be an approach candidate for inpatient rehabilitation.   REVIEW OF SYSTEMS:  Noncontributory.   PAST MEDICAL HISTORY:  1. History of myomectomy in 1995.  2. Hypertension.   FAMILY HISTORY:  Positive for cancer and coronary artery disease.   SOCIAL HISTORY:  The patient presently has been living on her own with  her nephew only temporarily living with her.  She has no children and no  husband.  She lives in a one-level home with six steps to enter.  She  had been working at [employer] in the [employer] prior to this stroke.  [place] but her mother passed  away 2 years ago.   FUNCTIONAL HISTORY:  Prior Admission:  Independent.   ALLERGIES:  SHELLFISH, IODINE, QUESTIONABLE LISINOPRIL.   MEDICATIONS PRIOR TO ADMISSION:  Multivitamin daily.   LABORATORY:  Recent hemoglobin was 13.5 with hematocrit 38.6, platelet  count of 392,000 and white count of 11.  Recent HbA1c was 6.  Recent  sodium was 134, potassium 3.6, chloride 99, bicarbonate 30, BUN 15 and  creatinine 0.6 next.   PHYSICAL EXAM:  GENERAL: Reasonably well-appearing middle-aged adult  female lying in bed in no acute discomfort.  VITAL SIGNS:  Blood pressure 127/79 with pulse of 70, respiratory rate  18 and temperature 98.0.  HEENT: Normocephalic, nontraumatic.  CARDIOVASCULAR: Regular rate and rhythm, S1-S2 without murmurs.  ABDOMEN:  Soft, nontender with positive bowel sounds.  LUNGS:  Clear to auscultation bilaterally.  NEUROLOGIC: Alert and oriented x3 with minimal cues.  Cranial nerve exam  was unremarkable.  Bilateral upper extremity exam showed 4+/5 strength  in the right upper extremity and 4-/5  strength in the left upper  extremity.  Coordinated movements were slightly slow on the left  compared to the right.  Sensation was slightly decreased on left  compared to the right.  EXTREMITIES:  Lower extremity exam showed hip flexion, knee extension  and ankle dorsiflexion 4+/5 on the right and 4-/5 on the left.  Bulk and  tone were normal throughout the bilateral lower extremities.  Sensation  was intact to light touch with the bilateral lower extremities.   ASSESSMENT:  1. Right lateral thalamus and posterior limb internal capsule infarct,      probably secondary to hypertension.  2. Hypertension with poor medical compliance.  3. Dyslipidemia.  4. Left external ear cellulitis presently on Augmentin   Presently the patient has deficits in ADLs, transfers and ambulation  related to the above-noted  thalamic and posterior limb infarct.   PLAN:  1. Admit to the rehabilitation unit with daily therapies to include      physical therapy for range of motion, strengthening, bed mobility,      transfers, pre-gait training, gait training and equipment.  2. Occupational therapy for range of motion, strengthening, ADLs,      cognitive/perceptual training, splinting and equipment.  3. Rehab nursing for skin care, wound care and bowel and bladder      training as necessary.  4. Case management to assess home environment, assist with discharge      planning and arrange for appropriate follow-up care.  5. Social work to assess family and social support, consultation      regarding disability issues and assist in discharge planning.  6. Regular diet.  7. Check admission labs including CBC and CMET on Friday, October 09, 2007.  8. Case manager to help find a primary care physician locally.  9. Tylenol 325 mg one to two tablets p.o. q.4 h p.r.n.  10.Incentive spirometry q.2 h while awake  11.Thigh-high TEDS removed at bedtime.  12.Norvasc 5 mg p.o. at bedtime  13.Aspirin 81 mg p.o. daily.  14.Hydrochlorothiazide 12.5 mg p.o. daily.  15.Toprol XL 50 mg p.o. daily.  16.Zocor 20 mg p.o. daily.  17.Old EKG to chart.  18.Routine turning to prevent skin breakdown.  19.Dulcolax suppository one per rectum daily p.r.n.  20.Augmentin 500 mg p.o. b.i.d. times 4 days.  21.Trazodone 25-50 mg p.o. at bedtime p.r.n.   PROGNOSIS:  Good.   ESTIMATED LENGTH OF STAY:  Five to 10 days   GOALS:  Modified independent, ADLs, transfers and ambulation.           ______________________________  Ellwood Dense, M.D.     DC/MEDQ  D:  10/07/2007  T:  10/07/2007  Job:  [id number]
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
## 2011-03-05 ENCOUNTER — Other Ambulatory Visit: Payer: Self-pay | Admitting: *Deleted

## 2011-03-05 MED ORDER — AMLODIPINE BESYLATE 5 MG PO TABS: 5.0000 mg | ORAL_TABLET | Freq: Every day | ORAL | Status: DC

## 2011-03-22 ENCOUNTER — Other Ambulatory Visit: Payer: Self-pay | Admitting: *Deleted

## 2011-03-22 MED ORDER — POTASSIUM CHLORIDE CRYS ER 20 MEQ PO TBCR: 20.0000 meq | EXTENDED_RELEASE_TABLET | Freq: Every day | ORAL | Status: DC

## 2011-03-22 MED ORDER — AMLODIPINE BESYLATE 5 MG PO TABS: 5.0000 mg | ORAL_TABLET | Freq: Every day | ORAL | Status: DC

## 2011-03-22 MED ORDER — HYDROCHLOROTHIAZIDE 12.5 MG PO CAPS: 12.5000 mg | ORAL_CAPSULE | ORAL | Status: DC

## 2011-06-12 ENCOUNTER — Ambulatory Visit (INDEPENDENT_AMBULATORY_CARE_PROVIDER_SITE_OTHER): Payer: 59 | Admitting: Family Medicine

## 2011-06-12 ENCOUNTER — Encounter: Payer: Self-pay | Admitting: Family Medicine

## 2011-06-12 VITALS — BP 116/78 | HR 60 | Temp 98.2°F | Wt 163.0 lb

## 2011-06-12 DIAGNOSIS — J069 Acute upper respiratory infection, unspecified: Secondary | ICD-10-CM

## 2011-06-12 NOTE — Progress Notes (Signed)
  Subjective:    Patient ID: Anne [name], female    DOB: 02-20-1946, 65 y.o.   MRN: [id number]  HPI Here for 3 days of head congestion, PND, ST, and sneezing. No fever or cough.   Review of Systems  Constitutional: Negative.   HENT: Positive for congestion, sneezing and postnasal drip. Negative for sinus pressure.   Eyes: Negative.   Respiratory: Negative.        Objective:   Physical Exam  Constitutional: She appears well-developed and well-nourished.  HENT:  Right Ear: External ear normal.  Left Ear: External ear normal.  Nose: Nose normal.  Mouth/Throat: Oropharynx is clear and moist. No oropharyngeal exudate.  Eyes: Conjunctivae are normal. Pupils are equal, round, and reactive to light.  Neck: Neck supple. No thyromegaly present.  Pulmonary/Chest: Effort normal and breath sounds normal.  Lymphadenopathy:    She has no cervical adenopathy.          Assessment & Plan:  Rest, fluids, use Mucinex and Claritin prn

## 2011-07-19 LAB — COMPREHENSIVE METABOLIC PANEL
AST: 21
AST: 23
Albumin: 3.8
CO2: 28
CO2: 28
Calcium: 9
Chloride: 102
Creatinine, Ser: 0.61
Creatinine, Ser: 0.72
GFR calc Af Amer: >60
GFR calc Af Amer: >60
GFR calc non Af Amer: >60
Potassium: 3.2 — ABNORMAL LOW
Sodium: 136
Sodium: 136
Total Bilirubin: 0.9
Total Protein: 6.4

## 2011-07-19 LAB — BASIC METABOLIC PANEL
BUN: 11
BUN: 12
BUN: 15
CO2: 28
CO2: 30
CO2: 30
CO2: 30
Calcium: 8.8
Calcium: 9.1
Chloride: 103
Chloride: 94 — ABNORMAL LOW
Chloride: 97
Chloride: 99
Chloride: 99
Creatinine, Ser: 0.62
Creatinine, Ser: 0.69
Creatinine, Ser: 0.78
Creatinine, Ser: 0.86
Creatinine, Ser: 0.86
GFR calc Af Amer: >60
GFR calc Af Amer: >60
GFR calc Af Amer: >60
GFR calc Af Amer: >60
GFR calc non Af Amer: >60
GFR calc non Af Amer: >60
GFR calc non Af Amer: >60
Glucose, Bld: 108 — ABNORMAL HIGH
Glucose, Bld: 120 — ABNORMAL HIGH
Glucose, Bld: 157 — ABNORMAL HIGH
Glucose, Bld: 93
Potassium: 3.3 — ABNORMAL LOW
Potassium: 3.7
Sodium: 138
Sodium: 138

## 2011-07-19 LAB — LIPID PANEL
HDL: 38 — ABNORMAL LOW
LDL Cholesterol: 162 — ABNORMAL HIGH
Total CHOL/HDL Ratio: 5.9
Triglycerides: 132
VLDL: 26

## 2011-07-19 LAB — DIFFERENTIAL
Basophils Absolute: 0
Basophils Relative: 0
Eosinophils Absolute: 0 — ABNORMAL LOW
Eosinophils Relative: 0
Eosinophils Relative: 3
Lymphocytes Relative: 25
Lymphocytes Relative: 7 — ABNORMAL LOW
Lymphs Abs: 1
Lymphs Abs: 2.9
Monocytes Absolute: 0.5
Monocytes Relative: 7
Neutrophils Relative %: 90 — ABNORMAL HIGH

## 2011-07-19 LAB — URINALYSIS, ROUTINE W REFLEX MICROSCOPIC
Bilirubin Urine: NEGATIVE
Ketones, ur: 15 — AB
Leukocytes, UA: NEGATIVE

## 2011-07-19 LAB — CBC
HCT: 38.6
HCT: 39.3
HCT: 41.9
HCT: 43.2
Hemoglobin: 14.7
MCHC: 34
MCHC: 34.7
MCHC: 35
MCV: 86.6
MCV: 86.6
MCV: 87.9
MCV: 88
MCV: 88.1
Platelets: 379
Platelets: 385
Platelets: 389
Platelets: 401 — ABNORMAL HIGH
Platelets: 429 — ABNORMAL HIGH
RBC: 4.45
RBC: 4.47
RBC: 4.77
RDW: 13
RDW: 13.1
RDW: 13.3
RDW: 13.4
WBC: 11.7 — ABNORMAL HIGH
WBC: 12.6 — ABNORMAL HIGH
WBC: 14.4 — ABNORMAL HIGH
WBC: 14.7 — ABNORMAL HIGH

## 2011-07-19 LAB — HEMOGLOBIN A1C: Mean Plasma Glucose: 136

## 2011-07-19 LAB — PROTIME-INR
INR: 1
Prothrombin Time: 13.1

## 2011-07-19 LAB — I-STAT 8, (EC8 V) (CONVERTED LAB)
Acid-Base Excess: 3 — ABNORMAL HIGH
Operator id: 198171
Potassium: 3.3 — ABNORMAL LOW
Sodium: 136
pH, Ven: 7.455 — ABNORMAL HIGH

## 2011-07-19 LAB — MAGNESIUM: Magnesium: 2.1

## 2011-07-19 LAB — URINE MICROSCOPIC-ADD ON

## 2011-07-19 LAB — B-NATRIURETIC PEPTIDE (CONVERTED LAB): Pro B Natriuretic peptide (BNP): 54

## 2011-07-19 LAB — CK TOTAL AND CKMB (NOT AT ARMC): Relative Index: INVALID

## 2011-07-19 LAB — POCT I-STAT CREATININE: Creatinine, Ser: 0.7

## 2011-09-17 ENCOUNTER — Ambulatory Visit (INDEPENDENT_AMBULATORY_CARE_PROVIDER_SITE_OTHER): Payer: 59 | Admitting: Family Medicine

## 2011-09-17 ENCOUNTER — Encounter: Payer: Self-pay | Admitting: Family Medicine

## 2011-09-17 VITALS — BP 128/76 | HR 86 | Temp 99.3°F | Wt 164.0 lb

## 2011-09-17 DIAGNOSIS — J40 Bronchitis, not specified as acute or chronic: Secondary | ICD-10-CM

## 2011-09-17 MED ORDER — PROMETHAZINE HCL 25 MG PO TABS: 25.0000 mg | ORAL_TABLET | ORAL | Status: DC | PRN

## 2011-09-17 MED ORDER — AZITHROMYCIN 250 MG PO TABS: ORAL_TABLET | ORAL | Status: AC

## 2011-09-17 NOTE — Progress Notes (Signed)
  Subjective:    Patient ID: Raegyn Renda, female    DOB: [birth year], 65 y.o.   MRN: [id number]  HPI Here for 2 days of PND, chest congestion, coughing up yellow sputum, and vomiting. No fever   Review of Systems  Constitutional: Negative.   HENT: Positive for congestion and postnasal drip.   Eyes: Negative.   Respiratory: Positive for cough.   Gastrointestinal: Positive for nausea and vomiting. Negative for diarrhea and constipation.       Objective:   Physical Exam  Constitutional: She appears well-developed and well-nourished.  HENT:  Right Ear: External ear normal.  Left Ear: External ear normal.  Nose: Nose normal.  Mouth/Throat: Oropharynx is clear and moist. No oropharyngeal exudate.  Eyes: Conjunctivae are normal.  Neck: Neck supple. No thyromegaly present.  Pulmonary/Chest: Effort normal. No respiratory distress. She has no wheezes. She has no rales.  Lymphadenopathy:    She has no cervical adenopathy.          Assessment & Plan:  Rest, drink fluids

## 2011-11-29 ENCOUNTER — Other Ambulatory Visit: Payer: Self-pay | Admitting: Family Medicine

## 2011-12-05 ENCOUNTER — Other Ambulatory Visit: Payer: Self-pay | Admitting: Family Medicine

## 2011-12-06 ENCOUNTER — Telehealth: Payer: Self-pay | Admitting: Family Medicine

## 2011-12-06 MED ORDER — POTASSIUM CHLORIDE CRYS ER 20 MEQ PO TBCR: 20.0000 meq | EXTENDED_RELEASE_TABLET | Freq: Every day | ORAL | Status: DC

## 2011-12-06 NOTE — Telephone Encounter (Signed)
Script sent e-scribe 

## 2011-12-11 ENCOUNTER — Other Ambulatory Visit: Payer: Self-pay | Admitting: Family Medicine

## 2012-01-07 ENCOUNTER — Other Ambulatory Visit: Payer: Self-pay | Admitting: Family Medicine

## 2012-01-08 NOTE — Telephone Encounter (Signed)
Refill request for HCTZ. Pt has not had a CPE or med check since 07/05/2009. She was seen for an acute in December. Ok to fill?

## 2012-03-10 ENCOUNTER — Encounter: Payer: Self-pay | Admitting: Internal Medicine

## 2012-03-10 ENCOUNTER — Ambulatory Visit (INDEPENDENT_AMBULATORY_CARE_PROVIDER_SITE_OTHER): Payer: 59 | Admitting: Internal Medicine

## 2012-03-10 VITALS — BP 148/88 | Temp 98.3°F | Wt 165.0 lb

## 2012-03-10 DIAGNOSIS — R42 Dizziness and giddiness: Secondary | ICD-10-CM

## 2012-03-10 DIAGNOSIS — R51 Headache: Secondary | ICD-10-CM

## 2012-03-10 DIAGNOSIS — E785 Hyperlipidemia, unspecified: Secondary | ICD-10-CM

## 2012-03-10 DIAGNOSIS — I10 Essential (primary) hypertension: Secondary | ICD-10-CM

## 2012-03-10 MED ORDER — NEBIVOLOL HCL 10 MG PO TABS: 10.0000 mg | ORAL_TABLET | Freq: Every day | ORAL | Status: DC

## 2012-03-10 NOTE — Patient Instructions (Signed)
Please seek emergency medical care if your headache and dizziness gets worse. We will contact you re: MRI / MRA of Brain results Please complete the following lab tests within 2-3 days BMET, TSH - 401.9 Lipid panel, LFTs, CRP - 272.4

## 2012-03-10 NOTE — Assessment & Plan Note (Signed)
She reports trying Lipitor in the past but had to stop due to symptoms of fatigue and myalgia. Repeat lipid panel, LFTs and CRP.
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
## 2012-03-10 NOTE — Assessment & Plan Note (Signed)
65 year old [place] American female with signs and symptoms of malignant hypertension. She is not sure whether she took her beta blocker yesterday. Rule out cerebellar stroke. Obtain MRI MRA of brain.  DC atenolol.  Switch to bystolic 10 mg once daily.  Continue other meds.  If persistent poor control, consider workup for secondary hypertension.

## 2012-03-18 ENCOUNTER — Ambulatory Visit
Admission: RE | Admit: 2012-03-18 | Discharge: 2012-03-18 | Disposition: A | Discharge status: Home / Self Care | Payer: 59 | Source: Ambulatory Visit | Attending: Internal Medicine | Admitting: Internal Medicine

## 2012-03-18 DIAGNOSIS — R42 Dizziness and giddiness: Secondary | ICD-10-CM

## 2012-03-18 DIAGNOSIS — R51 Headache: Secondary | ICD-10-CM

## 2012-03-24 ENCOUNTER — Telehealth: Payer: Self-pay | Admitting: Family Medicine

## 2012-03-24 NOTE — Telephone Encounter (Signed)
Pt called back and she was returning a call to [place].

## 2012-03-24 NOTE — Telephone Encounter (Signed)
Pt called and said that she was returning call. Pls call back asap. Pt said that she will be at home # until 12pm today, and then she can be reached on cell phone.

## 2012-03-25 ENCOUNTER — Ambulatory Visit (INDEPENDENT_AMBULATORY_CARE_PROVIDER_SITE_OTHER): Payer: 59 | Admitting: Family Medicine

## 2012-03-25 ENCOUNTER — Encounter: Payer: Self-pay | Admitting: Family Medicine

## 2012-03-25 VITALS — BP 142/84 | HR 66 | Temp 98.7°F | Wt 167.0 lb

## 2012-03-25 DIAGNOSIS — F419 Anxiety disorder, unspecified: Secondary | ICD-10-CM

## 2012-03-25 DIAGNOSIS — I10 Essential (primary) hypertension: Secondary | ICD-10-CM

## 2012-03-25 DIAGNOSIS — R51 Headache: Secondary | ICD-10-CM

## 2012-03-25 DIAGNOSIS — F411 Generalized anxiety disorder: Secondary | ICD-10-CM

## 2012-03-25 DIAGNOSIS — R519 Headache, unspecified: Secondary | ICD-10-CM

## 2012-03-25 MED ORDER — AMLODIPINE BESYLATE 10 MG PO TABS: 10.0000 mg | ORAL_TABLET | Freq: Every day | ORAL | Status: DC

## 2012-03-25 MED ORDER — NEBIVOLOL HCL 10 MG PO TABS: 10.0000 mg | ORAL_TABLET | Freq: Every day | ORAL | Status: DC

## 2012-03-25 NOTE — Progress Notes (Signed)
  Subjective:    Patient ID: Anne [name], female    DOB: 1945/11/05, 66 y.o.   MRN: [id number]  HPI Here to recheck very high BP and HAs. She was seen here on 03-10-12 the day after she developed HAs at work and was found to have a systolic BP of over 200. When seen here the BP was 148/88. She was switched from Atenolol to Bystolic. An MRA of the brain was obtained on  03-18-12 which was normal except for some slight narrowing of the right P2 segment with distal branch vessel attenuation. Since then she has felt better with only mild HAs at times. She admits to feeling a lot of stress at work .    Review of Systems  Constitutional: Negative.   Respiratory: Negative.   Cardiovascular: Negative.   Neurological: Negative.        Objective:   Physical Exam  Constitutional: She is oriented to person, place, and time. She appears well-developed and well-nourished.  Neck: No thyromegaly present.  Cardiovascular: Normal rate, regular rhythm, normal heart sounds and intact distal pulses.   Pulmonary/Chest: Effort normal and breath sounds normal.  Lymphadenopathy:    She has no cervical adenopathy.  Neurological: She is alert and oriented to person, place, and time.          Assessment & Plan:  It seems the main reason her BP shoots up is stress. We discussed the possibility of getting on a medication for this, but she declines at this time. I did urge her to get some daily exercise which can lower her stress levels. Stay on Bystolic 10 mg a day. Increase Amlodipine from 5 mg to 10 mg a day. Recheck in one month.

## 2012-04-21 ENCOUNTER — Other Ambulatory Visit: Payer: Self-pay | Admitting: Family Medicine

## 2012-05-12 ENCOUNTER — Telehealth: Payer: Self-pay | Admitting: Family Medicine

## 2012-05-12 NOTE — Telephone Encounter (Signed)
Caller: Anne [name]/Patient is calling with a question about Bystolic. The medication was written by Nelwyn Salisbury Pt wants clarification on BP meds.  Office to please call pt at [phone] for further advice.   Medication Protocol Utilized.

## 2012-05-12 NOTE — Telephone Encounter (Signed)
What is the question? 

## 2012-05-13 NOTE — Telephone Encounter (Signed)
I left a voice message for pt to return my call.  

## 2012-05-13 NOTE — Telephone Encounter (Signed)
Patient called back. Based on her current med list, she is wondering if she is to be taking HCTZ. She was taking it. Dr. Artist Pais put her on Bystolic, and when she went to get refills on everything, the pharmacy did not refill the HCTZ so she doesn't know if that means she is not supposed to take it anymore. Also, she states that her amlodipine last month were large white pills. This time, they are 5mg , and a small white pill. Wondering if she is taking the same/correct dosage? I see it was for 10mg  on med list, but most recently is for 5mg , and she doesn't know which is correct. The 5mg  rx was written for 30, but [facility] gave her 81 & told her that was the only way they would do it. Please call to advise. She is confused as to what to take, so she hasn't taken anything in 1 week.

## 2012-05-14 NOTE — Telephone Encounter (Signed)
She is supposed to be taking Amlodipine 10 mg a day, Bystolic 10 mg a day, and HCTZ 12.5 mg a day. Please clear this up with her pharmacy

## 2012-05-14 NOTE — Telephone Encounter (Signed)
LMOM with contact name & number to inform patient of MD instructions: [facility] has been contacted and informed as well; corrections made in pharmacy system/SLS  HCTZ rf 03.26.13 #30x8 has active refills & is being filled for pt P/U; Bystolic rf 06.12.13 was placed on HOLD for "unknown reason" per pharmacy & is also being filled for pt P/U; Amlodipine rf 06.12.13 was correct, but then incorrectly sent to pharmacy (07.09.13 for 5 mg One tablet qd)--pharmacy has corrected this error in their system and Pt informed to take [2] 5 mg tablets daily until she runs out & then pharmacy will refill 10 mg, as directed.

## 2012-06-24 ENCOUNTER — Encounter: Payer: Self-pay | Admitting: Internal Medicine

## 2012-06-24 ENCOUNTER — Ambulatory Visit (INDEPENDENT_AMBULATORY_CARE_PROVIDER_SITE_OTHER): Payer: 59 | Admitting: Internal Medicine

## 2012-06-24 VITALS — BP 160/90 | Temp 98.3°F | Wt 165.0 lb

## 2012-06-24 DIAGNOSIS — H6090 Unspecified otitis externa, unspecified ear: Secondary | ICD-10-CM

## 2012-06-24 DIAGNOSIS — H9201 Otalgia, right ear: Secondary | ICD-10-CM

## 2012-06-24 DIAGNOSIS — H9209 Otalgia, unspecified ear: Secondary | ICD-10-CM

## 2012-06-24 DIAGNOSIS — H60399 Other infective otitis externa, unspecified ear: Secondary | ICD-10-CM

## 2012-06-24 MED ORDER — TRAMADOL HCL 50 MG PO TABS: 50.0000 mg | ORAL_TABLET | Freq: Three times a day (TID) | ORAL | Status: AC | PRN

## 2012-06-24 NOTE — Patient Instructions (Addendum)
Our office will contact you re: ENT referral 

## 2012-06-24 NOTE — Assessment & Plan Note (Addendum)
66 year old [place] American female with right ear pain secondary to fungal otitis externa.  We attempted to irrigate her ear but she could not tolerate due to discomfort. She has gross hearing loss.  Refer to ENT - Dr. Ezzard Standing or Dr. Annalee Genta for further evaluation and treatment. Use tramadol for pain.

## 2012-06-24 NOTE — Progress Notes (Signed)
  Subjective:    Patient ID: Anne [name], female    DOB: 1946/01/07, 66 y.o.   MRN: [id number]  HPI  66 year old African American female complains of pain in her right ear for 2 days. She has sensation that something is stuck in her ear. She describes a dull aching sensation. She also reports hearing pulsing noise last night.  She has history of cerumen impaction and was using Debrox over-the-counter. Over the last 24 hours for ear pain seems to be progressively worsening.  Review of Systems    She denies fevers Positive for hearing loss in the right side.  Past Medical History  Diagnosis Date  . Hyperlipidemia   . Hypertension   . Stroke 09-2007    has mild left leg weakness, saw Dr. Vickey Huger  . Osteopenia     per DEXA on 07-19-09    History   Social History  . Marital Status: Single    Spouse Name: N/A    Number of Children: N/A  . Years of Education: N/A   Occupational History  . Not on file.   Social History Main Topics  . Smoking status: Never Smoker   . Smokeless tobacco: Never Used  . Alcohol Use: Yes  . Drug Use: No  . Sexually Active: Not on file   Other Topics Concern  . Not on file   Social History Narrative  . No narrative on file    No past surgical history on file.  No family history on file.  Allergies  Allergen Reactions  . Iodine     Current Outpatient Prescriptions on File Prior to Visit  Medication Sig Dispense Refill  . amLODipine (NORVASC) 10 MG tablet Take 1 tablet (10 mg total) by mouth daily.  30 tablet  11  . Calcium-Vitamin D (CALTRATE 600 PLUS-VIT D PO) Take 1 each by mouth daily.        . hydrochlorothiazide (MICROZIDE) 12.5 MG capsule TAKE 1 CAPSULE EVERY DAY BY MOUTH  30 capsule  8  . nebivolol (BYSTOLIC) 10 MG tablet Take 1 tablet (10 mg total) by mouth daily.  30 tablet  11  . potassium chloride SA (KLOR-CON M20) 20 MEQ tablet Take 1 tablet (20 mEq total) by mouth daily.  30 tablet  11    BP 160/90  Temp 98.3 F  (36.8 C) (Oral)  Wt 165 lb (74.844 kg)    Objective:   Physical Exam  Constitutional: She appears well-developed and well-nourished.  HENT:  Head: Normocephalic and atraumatic.       Right cerumen with white filaments and small tiny black dots She is unable to hear finger rub on right side  Cardiovascular: Normal rate, regular rhythm and normal heart sounds.   Pulmonary/Chest: Effort normal and breath sounds normal. She has no wheezes.          Assessment & Plan:

## 2012-06-25 ENCOUNTER — Ambulatory Visit: Payer: 59 | Admitting: Family Medicine

## 2012-08-06 ENCOUNTER — Encounter: Payer: Self-pay | Admitting: Family Medicine

## 2012-08-06 ENCOUNTER — Telehealth: Payer: Self-pay | Admitting: Family Medicine

## 2012-08-06 ENCOUNTER — Ambulatory Visit (INDEPENDENT_AMBULATORY_CARE_PROVIDER_SITE_OTHER): Payer: 59 | Admitting: Family Medicine

## 2012-08-06 VITALS — BP 144/80 | HR 54 | Temp 98.3°F | Wt 163.0 lb

## 2012-08-06 DIAGNOSIS — I10 Essential (primary) hypertension: Secondary | ICD-10-CM

## 2012-08-06 DIAGNOSIS — J069 Acute upper respiratory infection, unspecified: Secondary | ICD-10-CM

## 2012-08-06 MED ORDER — HYDROCHLOROTHIAZIDE 25 MG PO TABS: 25.0000 mg | ORAL_TABLET | Freq: Every day | ORAL | Status: DC

## 2012-08-06 NOTE — Telephone Encounter (Signed)
Caller: Winna/Patient; Patient Name: Anne [name]; PCP: Gershon Crane [facility]); Best Callback Phone Number: [phone].  She states yesterday 08/05/12 she felt nauseated with no other s/sx.  This morning 08/06/12, she feels like she sinus and nasal drainage , clear in color. +runny nose. Afebrile. She complains of everything makes her nauseated. She has not tried any home treatment.  Patient very difficult to assess and triage.  She does complain of ear congestion, nausea and today pressure is slightley elevated at 178/90 but has not taken morning medication.   Emergent s/sx ruled out per Upper respiratory Infection Protocol with Ear Congestion, Pressure , Fullness. Home care advised.  Patient scheduled an appointment today , with Dr. Clent Ridges at 10;30 for evaluation. Patient vague with symptoms/Nurse judgement to be seen today. patient expressed understanding. She will closely mointor and call back for questions, changes or concerns.

## 2012-08-06 NOTE — Progress Notes (Signed)
  Subjective:    Patient ID: Anne [name], female    DOB: Oct 15, 1945, 66 y.o.   MRN: [id number]  HPI Here for 2 days of PND, scratchy throat, and HA. No cough. Her BP has been creeping up again over the past few months.    Review of Systems  Constitutional: Negative.   HENT: Positive for congestion, sore throat, rhinorrhea and postnasal drip.   Eyes: Negative.   Respiratory: Negative.        Objective:   Physical Exam  Constitutional: She appears well-developed and well-nourished.  HENT:  Right Ear: External ear normal.  Left Ear: External ear normal.  Nose: Nose normal.  Mouth/Throat: Oropharynx is clear and moist.  Eyes: Conjunctivae normal are normal.  Neck: No thyromegaly present.  Cardiovascular: Normal rate, regular rhythm, normal heart sounds and intact distal pulses.   Pulmonary/Chest: Effort normal and breath sounds normal.  Lymphadenopathy:    She has no cervical adenopathy.          Assessment & Plan:  Her BP is up some, so we will increase the HCTZ to 25 mg a day. She seems to be at the start of a viral URI. Try Claritin prn

## 2012-10-19 ENCOUNTER — Telehealth: Payer: Self-pay | Admitting: Family Medicine

## 2012-10-19 NOTE — Telephone Encounter (Signed)
Patient Information:  Caller Name: Anne [name]  Phone: [phone]  Patient: Anne [name], Anne [name]  Gender: Female  DOB: 09-12-1946  Age: 67 Years  [facility]P: [name]  Office Follow Up:  Does the office need to follow up with this patient?: No  Instructions For The Office: N/A   Symptoms  Reason For Call & Symptoms: Reports headache x 2-3 weeks. Reports pain comes and goes at times. Reports that she was told that she has a narrowed blood vessel in her head. Also reports yellow, hard, cornmeal consistency material dropping into her mouth/throat.  Reviewed Health History In EMR: Yes  Reviewed Medications In EMR: Yes  Reviewed Allergies In EMR: Yes  Reviewed Surgeries / Procedures: Yes  Date of Onset of Symptoms: 10/05/2012  Treatments Tried: Tylenol  Treatments Tried Worked: Yes  Guideline(s) Used:  Headache  Sinus Pain and Congestion  Disposition Per Guideline:   See Today or Tomorrow in Office  Reason For Disposition Reached:   Unexplained headache that is present > 24 hours  Advice Given:  Call Back If:  You become worse.  Appointment Scheduled:  10/20/2012 11:15:00 Appointment Scheduled Provider:  Gershon [name] [facility])

## 2012-10-20 ENCOUNTER — Ambulatory Visit: Payer: Self-pay | Admitting: Family Medicine

## 2012-11-06 ENCOUNTER — Telehealth: Payer: Self-pay | Admitting: Family Medicine

## 2012-11-06 NOTE — Telephone Encounter (Signed)
Patient Information:  Caller Name: Nadie  Phone: [phone]  Patient: Anne [name], Anne [name]  Gender: Female  DOB: 1945/11/19  Age: 67 Years  PCP: Gershon Crane [facility])  Office Follow Up:  Does the office need to follow up with this patient?: Yes  Instructions For The Office: Office please review for Amilodipine refill  RN Note:  Instructred pt that MD will review and pt to check back at pharmacy a little later today; will comply; [facility] [phone]  Symptoms  Reason For Call & Symptoms: Pt is calling and states that she needs a refill on her Amilodipine; she went to the pharmacy and pharmacy instructed pt that Amilodipine is no longer in the system; pt is calling because she believes that she is on this medication and she needs a refill; RN reviewed in EPIC and Amilodipine 10mg  by mouth daily was noted as a current medication. Pt denies any sx at this time; just needing a refill  Reviewed Health History In EMR: Yes  Reviewed Medications In EMR: Yes  Reviewed Allergies In EMR: Yes  Reviewed Surgeries / Procedures: Yes  Date of Onset of Symptoms: 11/06/2012  Guideline(s) Used:  No Protocol Available - Information Only  Disposition Per Guideline:   Discuss with PCP and Callback by Nurse Today  Reason For Disposition Reached:   Nursing judgment  Advice Given:  Call Back If:  New symptoms develop  You become worse.

## 2012-11-10 MED ORDER — AMLODIPINE BESYLATE 10 MG PO TABS: 10.0000 mg | ORAL_TABLET | Freq: Every day | ORAL | Status: DC

## 2012-11-10 NOTE — Telephone Encounter (Signed)
I sent script e-scribe and left voice message for pt 

## 2012-12-02 ENCOUNTER — Telehealth: Payer: Self-pay | Admitting: Family Medicine

## 2012-12-02 MED ORDER — AMLODIPINE BESYLATE 10 MG PO TABS: 10.0000 mg | ORAL_TABLET | Freq: Every day | ORAL | Status: DC

## 2012-12-02 MED ORDER — HYDROCHLOROTHIAZIDE 25 MG PO TABS: 25.0000 mg | ORAL_TABLET | Freq: Every day | ORAL | Status: DC

## 2012-12-02 NOTE — Telephone Encounter (Signed)
Request for Amlodipine & HCTZ and a 90 day supply, which I did send e-scribe

## 2013-01-23 ENCOUNTER — Encounter: Payer: Self-pay | Admitting: Family Medicine

## 2013-01-23 ENCOUNTER — Ambulatory Visit (INDEPENDENT_AMBULATORY_CARE_PROVIDER_SITE_OTHER): Payer: 59 | Admitting: Family Medicine

## 2013-01-23 VITALS — BP 154/100 | HR 89 | Temp 97.7°F | Ht 62.5 in | Wt 164.5 lb

## 2013-01-23 DIAGNOSIS — I10 Essential (primary) hypertension: Secondary | ICD-10-CM

## 2013-01-23 NOTE — Patient Instructions (Addendum)

## 2013-01-23 NOTE — Progress Notes (Signed)
  Subjective:    Patient here complaining  of elevated blood pressure.  She  is adherent to a low-salt diet.  Blood pressure is not well controlled at home. Cardiac symptoms: headache and nosebleeds. Patient denies: chest pain, chest pressure/discomfort, dyspnea, exertional chest pressure/discomfort, fatigue, irregular heart beat, lower extremity edema and syncope. Cardiovascular risk factors: advanced age (older than 104 for men, 43 for women), diabetes mellitus, dyslipidemia, hypertension and sedentary lifestyle. Use of agents associated with hypertension: amphetamines. History of target organ damage: stroke.  The following portions of the patient's history were reviewed and updated as appropriate: allergies, current medications, past family history, past medical history, past social history, past surgical history and problem list.  Review of Systems Pertinent items are noted in HPI.     Objective:    BP 154/100  Pulse 89  Temp(Src) 97.7 F (36.5 C) (Oral)  Ht 5' 2.5" (1.588 m)  Wt 164 lb 8 oz (74.617 kg)  BMI 29.59 kg/m2  SpO2 96% General appearance: alert, cooperative and no distress Neck: no adenopathy, supple, symmetrical, trachea midline and thyroid not enlarged, symmetric, no tenderness/mass/nodules Lungs: clear to auscultation bilaterally Heart: S1, S2 normal   + murmur   Assessment:    Hypertension, . Evidence of target organ damage: transient ischemic attack. / cva   Plan:    Medication: increase to bystolic 20 mg . Dietary sodium restriction. Regular aerobic exercise. Check blood pressures 1 times daily and record. Follow up: 1 week and as needed.

## 2013-01-25 ENCOUNTER — Ambulatory Visit (INDEPENDENT_AMBULATORY_CARE_PROVIDER_SITE_OTHER): Payer: 59 | Admitting: Family Medicine

## 2013-01-25 ENCOUNTER — Ambulatory Visit: Payer: 59 | Admitting: Internal Medicine

## 2013-01-25 ENCOUNTER — Encounter: Payer: Self-pay | Admitting: Family Medicine

## 2013-01-25 VITALS — BP 130/70 | HR 56 | Temp 97.8°F

## 2013-01-25 DIAGNOSIS — I10 Essential (primary) hypertension: Secondary | ICD-10-CM

## 2013-01-25 MED ORDER — NEBIVOLOL HCL 20 MG PO TABS: 1.0000 | ORAL_TABLET | Freq: Every day | ORAL | Status: DC

## 2013-01-25 NOTE — Progress Notes (Signed)
  Subjective:    Patient ID: Anne [name], female    DOB: [birth year], 67 y.o.   MRN: [id number]  HPI Patient seen for followup regarding hypertension. She was seen in Saturday clinic with nosebleed. Blood pressure then 154/100. Patient obtained recent home blood pressure reading of 189/91 Saturday, Bystolic increased to 20 mg daily No further nosebleeds. Denies any dizziness. No headaches. No chest pains. Patient also remains on amlodipine 10 mg daily and HCTZ 25 mg daily No recent nonsteroidal use. No peripheral edema.  Past Medical History  Diagnosis Date  . Hyperlipidemia   . Hypertension   . Stroke 09-2007    has mild left leg weakness, saw Dr. Vickey Huger  . Osteopenia     per DEXA on 07-19-09   No past surgical history on file.  reports that she has never smoked. She has never used smokeless tobacco. She reports that  drinks alcohol. She reports that she does not use illicit drugs. family history is not on file. Allergies  Allergen Reactions  . Iodine       Review of Systems  Constitutional: Negative for fatigue.  Eyes: Negative for visual disturbance.  Respiratory: Negative for cough, chest tightness, shortness of breath and wheezing.   Cardiovascular: Negative for chest pain, palpitations and leg swelling.  Neurological: Negative for dizziness, seizures, syncope, weakness, light-headedness and headaches.       Objective:   Physical Exam  Constitutional: She appears well-developed and well-nourished.  Neck: Neck supple. No thyromegaly present.  Cardiovascular: Normal rate and regular rhythm.  Exam reveals no gallop.   Pulmonary/Chest: Effort normal and breath sounds normal. No respiratory distress. She has no wheezes. She has no rales.  Musculoskeletal: She exhibits no edema.          Assessment & Plan:  Hypertension. Improved. Refill Bystolic 20 mg daily. Continue with current doses of HCTZ and Amlodipine.  Followup with primary within one month to reassess.   She will try to lose some weight.

## 2013-02-24 ENCOUNTER — Telehealth: Payer: Self-pay | Admitting: Family Medicine

## 2013-02-24 NOTE — Telephone Encounter (Signed)
She needs an OV since she has seen multiple doctors since me

## 2013-02-24 NOTE — Telephone Encounter (Signed)
Patient called stating that she need a refill of her amlodipine 10 mg 1poqd and her potassium 20 meq 1poqd. Please assist.

## 2013-02-24 NOTE — Telephone Encounter (Signed)
I called pt and left voice message with below information.

## 2013-02-24 NOTE — Telephone Encounter (Signed)
Can we refill these? 

## 2013-02-26 ENCOUNTER — Ambulatory Visit (INDEPENDENT_AMBULATORY_CARE_PROVIDER_SITE_OTHER): Payer: 59 | Admitting: Family Medicine

## 2013-02-26 ENCOUNTER — Encounter: Payer: Self-pay | Admitting: Family Medicine

## 2013-02-26 VITALS — BP 160/90 | HR 62 | Temp 97.8°F | Wt 166.0 lb

## 2013-02-26 DIAGNOSIS — I10 Essential (primary) hypertension: Secondary | ICD-10-CM

## 2013-02-26 LAB — BASIC METABOLIC PANEL
BUN: 10 mg/dL (ref 6–23)
Creatinine, Ser: 0.6 mg/dL (ref 0.4–1.2)
GFR: 126.35 mL/min (ref >60.00)
Glucose, Bld: 85 mg/dL (ref 70–99)
Potassium: 3.7 mEq/L (ref 3.5–5.1)

## 2013-02-26 LAB — CBC WITH DIFFERENTIAL/PLATELET
Basophils Absolute: 0.1 10*3/uL (ref 0.0–0.1)
Eosinophils Absolute: 0.2 10*3/uL (ref 0.0–0.7)
Lymphocytes Relative: 23.5 % (ref 12.0–46.0)
MCHC: 34.2 g/dL (ref 30.0–36.0)
Monocytes Relative: 6.3 % (ref 3.0–12.0)
Neutrophils Relative %: 67 % (ref 43.0–77.0)
RDW: 13.1 % (ref 11.5–14.6)

## 2013-02-26 LAB — HEPATIC FUNCTION PANEL
ALT: 17 U/L (ref 0–35)
AST: 20 U/L (ref 0–37)
Albumin: 3.6 g/dL (ref 3.5–5.2)
Total Bilirubin: 0.6 mg/dL (ref 0.3–1.2)

## 2013-02-26 LAB — POCT URINALYSIS DIPSTICK
Bilirubin, UA: NEGATIVE
Leukocytes, UA: NEGATIVE
Nitrite, UA: NEGATIVE
Protein, UA: NEGATIVE
pH, UA: 8.5

## 2013-02-26 LAB — TSH: TSH: 2.89 u[IU]/mL (ref 0.35–5.50)

## 2013-02-26 MED ORDER — POTASSIUM CHLORIDE CRYS ER 20 MEQ PO TBCR: 20.0000 meq | EXTENDED_RELEASE_TABLET | Freq: Every day | ORAL | Status: DC

## 2013-02-26 MED ORDER — AMLODIPINE BESYLATE 10 MG PO TABS: 10.0000 mg | ORAL_TABLET | Freq: Every day | ORAL | Status: DC

## 2013-02-26 NOTE — Progress Notes (Signed)
  Subjective:    Patient ID: Anne [name], female    DOB: 12-15-1945, 67 y.o.   MRN: [id number]  HPI Here to follow up on BP. She was seen 4 weeks ago with an elevated BP, and her Bystolic was increased to 20 mg a day. She then was seen a week later and her BP had normalized. Unfortunately she ran out of Amlodipine 2 weeks ago and now her BP has gone up again. She feels fine.    Review of Systems  Constitutional: Negative.   Respiratory: Negative.   Cardiovascular: Negative.   Neurological: Negative.        Objective:   Physical Exam  Constitutional: She appears well-developed and well-nourished.  Cardiovascular: Normal rate, regular rhythm, normal heart sounds and intact distal pulses.   Pulmonary/Chest: Effort normal and breath sounds normal.          Assessment & Plan:  Refilled her meds. Get labs today

## 2013-03-03 NOTE — Progress Notes (Signed)
Quick Note:  Pt does not have a phone number listed, I put a copy of results in mail to pt. ______

## 2013-03-05 ENCOUNTER — Ambulatory Visit (INDEPENDENT_AMBULATORY_CARE_PROVIDER_SITE_OTHER): Payer: 59 | Admitting: Family Medicine

## 2013-03-05 ENCOUNTER — Telehealth: Payer: Self-pay | Admitting: Family Medicine

## 2013-03-05 ENCOUNTER — Encounter: Payer: Self-pay | Admitting: Family Medicine

## 2013-03-05 VITALS — BP 160/84 | HR 60 | Temp 98.0°F | Wt 166.0 lb

## 2013-03-05 DIAGNOSIS — K0889 Other specified disorders of teeth and supporting structures: Secondary | ICD-10-CM

## 2013-03-05 DIAGNOSIS — K089 Disorder of teeth and supporting structures, unspecified: Secondary | ICD-10-CM

## 2013-03-05 MED ORDER — HYDROCODONE-ACETAMINOPHEN 5-325 MG PO TABS: 1.0000 | ORAL_TABLET | Freq: Four times a day (QID) | ORAL | Status: DC | PRN

## 2013-03-05 NOTE — Telephone Encounter (Signed)
We can see her today.

## 2013-03-05 NOTE — Telephone Encounter (Signed)
See below note.

## 2013-03-05 NOTE — Progress Notes (Signed)
  Subjective:    Patient ID: Anne [name], female    DOB: 1946/07/25, 67 y.o.   MRN: [id number]  HPI Here for pain in the left lower jaw. About 10 days ago part of a filling fell out of a molar there. Over the past few days she has developed a lot of pain here. She has an appt with her dentist next week on 03-10-13.    Review of Systems  Constitutional: Negative.   HENT: Positive for dental problem. Negative for ear pain, congestion, neck pain, neck stiffness, postnasal drip and sinus pressure.   Eyes: Negative.   Neurological: Negative.        Objective:   Physical Exam  Constitutional: She appears well-developed and well-nourished.  HENT:  Right Ear: External ear normal.  Left Ear: External ear normal.  Nose: Nose normal.  There is a single left lower molar with a missing filling, no signs of infeciton  Eyes: Conjunctivae are normal.  Neck: Neck supple. No thyromegaly present.  Lymphadenopathy:    She has no cervical adenopathy.          Assessment & Plan:  Use Vicodin for pain and follow up with the dentist

## 2013-03-05 NOTE — Telephone Encounter (Signed)
Pt called and set up a voice mail call back; triage RN attempted to reach pt regarding toothache sx but unsuccessful and pt's VM was full and not accepting messages.

## 2013-03-05 NOTE — Telephone Encounter (Signed)
Pt is sch for today °

## 2013-03-05 NOTE — Telephone Encounter (Signed)
Patient Information:  Caller Name: Anne [name]  Phone: [phone]  Patient: Anne [name]  Gender: Female  DOB: 1946/03/21  Age: 67 Years  PCP: Anne [name] [facility])  Office Follow Up:  Does the office need to follow up with this patient?: Yes  Instructions For The Office: OFFICE PLEASE REVIEW AND CALL PT REGARDING ANY FURTHER INSTRUCTIONS FOR HER OR IF DR FRY WOULD LIKE TO SEE HER TODAY SICNE SHE IS UNABLE TO REACH A DENTIST  RN Note:  Pt states that she doesn't have a dentist and has called a few different ones and no one will call her back. OFFICE PLEASE REVIEW AND SEE IF DR FRY WOULD LIKE TO SEE HER TODAY.  Symptoms  Reason For Call & Symptoms: Pt is calling and states that she is having a toothache to one of her back tooth on the right side;  filling fell out approx 1 week ago; pain started today 03/05/13; pain is unbearable and she is going to go to ED; not at home to take temp; therefore she is unsure if she has a fever;  Reviewed Health History In EMR: Yes  Reviewed Medications In EMR: Yes  Reviewed Allergies In EMR: Yes  Reviewed Surgeries / Procedures: Yes  Date of Onset of Symptoms: 03/05/2013  Treatments Tried: Aleve and she had left over Amoxicillin and took a pill  Treatments Tried Worked: Yes  Guideline(s) Used:  Toothache  Disposition Per Guideline:   Call Dentist Today  Reason For Disposition Reached:   Lost filling  Advice Given:  Pain Medicines:  For pain relief, you can take either acetaminophen, ibuprofen, or naproxen.  Pain Medicines:  For pain relief, you can take either acetaminophen, ibuprofen, or naproxen.  They are over-the-counter (OTC) pain drugs. You can buy them at the drugstore.  Call Your Dentist If:  The toothache becomes worse  Patient Will Follow Care Advice:  YES

## 2013-05-05 ENCOUNTER — Telehealth: Payer: Self-pay | Admitting: Family Medicine

## 2013-05-05 NOTE — Telephone Encounter (Signed)
Pt would like to speak with someone regarding labs from 02/26/13. Please assist.

## 2013-05-06 ENCOUNTER — Other Ambulatory Visit: Payer: Self-pay | Admitting: Family Medicine

## 2013-05-06 DIAGNOSIS — E785 Hyperlipidemia, unspecified: Secondary | ICD-10-CM

## 2013-05-06 NOTE — Telephone Encounter (Signed)
Per Dr. Clent Ridges, okay to order labs for Lipid panel. I did put order in computer and spoke with pt.

## 2013-06-01 ENCOUNTER — Telehealth: Payer: Self-pay | Admitting: Family Medicine

## 2013-06-01 NOTE — Telephone Encounter (Signed)
Patient Information:  Caller Name: Licia  Phone: [phone]  Patient: Anne [name], Anne [name]  Gender: Female  DOB: [birth year]  Age: 67 Years  PCP: Gershon Crane [facility])  Office Follow Up:  Does the office need to follow up with this patient?: No  Instructions For The Office: N/A  RN Note:  Pt missed her cholesterol lab work at last draw due to eating snack prior to blood work. All emergent symptoms ruled out per elevated BP protocol, advised Pt to monitor BP and symptoms and to call back for appt within 2 weeks if no improvement or symptoms worsen.  Pt transferred to office for lab appt.  Symptoms  Reason For Call & Symptoms: Elevated BP w/ Fatique  Reviewed Health History In EMR: Yes  Reviewed Medications In EMR: Yes  Reviewed Allergies In EMR: Yes  Reviewed Surgeries / Procedures: Yes  Date of Onset of Symptoms: 05/18/2013  Guideline(s) Used:  High Blood Pressure  Disposition Per Guideline:   See Within 2 Weeks in Office  Reason For Disposition Reached:   BP > 140/90 and is taking BP medications  Advice Given:  General:  The goal of blood pressure treatment for most patients with hypertension is to keep the blood pressure under 140/90.  Call Back If:  Headache, blurred vision, difficulty talking, or difficulty walking occurs  Chest pain or difficulty breathing occurs  You become worse.  Patient Will Follow Care Advice:  YES

## 2013-06-02 ENCOUNTER — Other Ambulatory Visit: Payer: 59

## 2013-06-04 ENCOUNTER — Other Ambulatory Visit (INDEPENDENT_AMBULATORY_CARE_PROVIDER_SITE_OTHER): Payer: 59

## 2013-06-04 DIAGNOSIS — E785 Hyperlipidemia, unspecified: Secondary | ICD-10-CM

## 2013-06-04 LAB — LDL CHOLESTEROL, DIRECT: Direct LDL: 158.7 mg/dL

## 2013-06-04 LAB — LIPID PANEL
HDL: 38.5 mg/dL — ABNORMAL LOW (ref >39.00)
Total CHOL/HDL Ratio: 5
Triglycerides: 104 mg/dL (ref 0.0–149.0)

## 2013-06-09 NOTE — Progress Notes (Signed)
Quick Note:  I do not see a phone number listed, I put a copy of lab results in mail. ______

## 2013-06-21 ENCOUNTER — Other Ambulatory Visit: Payer: Self-pay

## 2013-06-21 MED ORDER — NEBIVOLOL HCL 20 MG PO TABS: 1.0000 | ORAL_TABLET | Freq: Every day | ORAL | Status: DC

## 2013-06-30 ENCOUNTER — Telehealth: Payer: Self-pay | Admitting: Family Medicine

## 2013-06-30 NOTE — Telephone Encounter (Signed)
Patient Information:  Caller Name: Ellieanna  Phone: [phone]  Patient: Anne [name], Anne [name]  Gender: Female  DOB: 08/31/1946  Age: 67 Years  PCP: Gershon Crane [facility])  Office Follow Up:  Does the office need to follow up with this patient?: Yes  Instructions For The Office: Please call back regarding work in appointment.  RN Note:  BP 156/80 06/29/13, Denies edema of hands, ankles or feet. Noted "mild soreness" on right side this morning that is now gone. No appointments remain with Dr Clent Ridges for 06/30/13.  Kameria works from 08-1699 but will ask if can leave work for an appointment this afternoon.  Message sent to office staff for call back regarding work in appointment.    Symptoms  Reason For Call & Symptoms: Urinary frequency for past 2 weeks. Asking if could be drinking to much fluid at one time?  Reviewed Health History In EMR: Yes  Reviewed Medications In EMR: Yes  Reviewed Allergies In EMR: No  Reviewed Surgeries / Procedures: Yes  Date of Onset of Symptoms: 06/16/2013  Treatments Tried: decreased amount of fluid intake  Treatments Tried Worked: Yes  Guideline(s) Used:  Urination Pain - Female  Disposition Per Guideline:   See Today in Office  Reason For Disposition Reached:   Age > 50 years  Advice Given:  Fluids:   Drink extra fluids. Drink 8-10 glasses of liquids a day (Reason: to produce a dilute, non-irritating urine).  Call Back If:   You become worse.  Patient Will Follow Care Advice:  YES

## 2013-06-30 NOTE — Telephone Encounter (Signed)
I spoke with pt and per Dr. Clent Ridges, okay to put on schedule for 07/01/13 Thursday at 11:15 am. Can you put pt on schedule?

## 2013-07-01 ENCOUNTER — Ambulatory Visit (INDEPENDENT_AMBULATORY_CARE_PROVIDER_SITE_OTHER): Payer: 59 | Admitting: Family Medicine

## 2013-07-01 ENCOUNTER — Encounter: Payer: Self-pay | Admitting: Family Medicine

## 2013-07-01 VITALS — BP 154/88 | HR 63 | Temp 97.8°F | Wt 167.0 lb

## 2013-07-01 DIAGNOSIS — N390 Urinary tract infection, site not specified: Secondary | ICD-10-CM

## 2013-07-01 DIAGNOSIS — R350 Frequency of micturition: Secondary | ICD-10-CM

## 2013-07-01 LAB — POCT URINALYSIS DIPSTICK
Bilirubin, UA: NEGATIVE
Nitrite, UA: NEGATIVE
Spec Grav, UA: 1.01
pH, UA: 7.5

## 2013-07-01 MED ORDER — CIPROFLOXACIN HCL 500 MG PO TABS: 500.0000 mg | ORAL_TABLET | Freq: Two times a day (BID) | ORAL | Status: DC

## 2013-07-01 NOTE — Progress Notes (Signed)
  Subjective:    Patient ID: Anne [name], female    DOB: [birth year], 67 y.o.   MRN: [id number]  HPI Here for one week of increased urinary frequency, mild left flank pain, nausea and fatigue. No fever.    Review of Systems  Constitutional: Positive for fatigue. Negative for fever.  Gastrointestinal: Negative.   Genitourinary: Positive for urgency, frequency and flank pain. Negative for dysuria.       Objective:   Physical Exam  Constitutional: She appears well-developed and well-nourished. No distress.  Abdominal: Soft. Bowel sounds are normal. She exhibits no distension and no mass. There is no tenderness. There is no rebound and no guarding.          Assessment & Plan:  Drink plenty of water. Culture the sample

## 2013-07-04 LAB — URINE CULTURE

## 2013-07-06 NOTE — Progress Notes (Signed)
Quick Note:  There is no phone number listed in pt's chart, nothing was identified in the urine. ______

## 2013-08-28 ENCOUNTER — Ambulatory Visit (INDEPENDENT_AMBULATORY_CARE_PROVIDER_SITE_OTHER): Payer: 59 | Admitting: Family Medicine

## 2013-08-28 ENCOUNTER — Encounter: Payer: Self-pay | Admitting: Family Medicine

## 2013-08-28 VITALS — BP 122/78 | HR 60 | Temp 97.3°F | Ht 60.25 in | Wt 166.0 lb

## 2013-08-28 DIAGNOSIS — J209 Acute bronchitis, unspecified: Secondary | ICD-10-CM

## 2013-08-28 DIAGNOSIS — I10 Essential (primary) hypertension: Secondary | ICD-10-CM

## 2013-08-28 MED ORDER — CEFDINIR 300 MG PO CAPS: 300.0000 mg | ORAL_CAPSULE | Freq: Two times a day (BID) | ORAL | Status: AC

## 2013-08-28 NOTE — Assessment & Plan Note (Signed)
Started on Antibiotics, probiotics, and mucinex

## 2013-08-28 NOTE — Progress Notes (Signed)
Pre visit review using our clinic review tool, if applicable. No additional management support is needed unless otherwise documented below in the visit note. 

## 2013-08-28 NOTE — Patient Instructions (Signed)
Mucinex 600 mg po twice a day x 10 day Probiotic such as Digestive advantage Increase rest and fluids  Bronchitis Bronchitis is the body's way of reacting to injury and/or infection (inflammation) of the bronchi. Bronchi are the air tubes that extend from the windpipe into the lungs. If the inflammation becomes severe, it may cause shortness of breath. CAUSES  Inflammation may be caused by:  A virus.  Germs (bacteria).  Dust.  Allergens.  Pollutants and many other irritants. The cells lining the bronchial tree are covered with tiny hairs (cilia). These constantly beat upward, away from the lungs, toward the mouth. This keeps the lungs free of pollutants. When these cells become too irritated and are unable to do their job, mucus begins to develop. This causes the characteristic cough of bronchitis. The cough clears the lungs when the cilia are unable to do their job. Without either of these protective mechanisms, the mucus would settle in the lungs. Then you would develop pneumonia. Smoking is a common cause of bronchitis and can contribute to pneumonia. Stopping this habit is the single most important thing you can do to help yourself. TREATMENT   Your caregiver may prescribe an antibiotic if the cough is caused by bacteria. Also, medicines that open up your airways make it easier to breathe. Your caregiver may also recommend or prescribe an expectorant. It will loosen the mucus to be coughed up. Only take over-the-counter or prescription medicines for pain, discomfort, or fever as directed by your caregiver.  Removing whatever causes the problem (smoking, for example) is critical to preventing the problem from getting worse.  Cough suppressants may be prescribed for relief of cough symptoms.  Inhaled medicines may be prescribed to help with symptoms now and to help prevent problems from returning.  For those with recurrent (chronic) bronchitis, there may be a need for steroid  medicines. SEEK IMMEDIATE MEDICAL CARE IF:   During treatment, you develop more pus-like mucus (purulent sputum).  You have a fever.  You become progressively more ill.  You have increased difficulty breathing, wheezing, or shortness of breath. It is necessary to seek immediate medical care if you are elderly or sick from any other disease. MAKE SURE YOU:   Understand these instructions.  Will watch your condition.  Will get help right away if you are not doing well or get worse. Document Released: 09/30/2005 Document Revised: 06/02/2013 Document Reviewed: 05/25/2013 [facility] Patient Information 2014 [place], [place]. Viral and Bacterial Pharyngitis Pharyngitis is soreness (inflammation) or infection of the pharynx. It is also called a sore throat. CAUSES  Most sore throats are caused by viruses and are part of a cold. However, some sore throats are caused by strep and other bacteria. Sore throats can also be caused by post nasal drip from draining sinuses, allergies and sometimes from sleeping with an open mouth. Infectious sore throats can be spread from person to person by coughing, sneezing and sharing cups or eating utensils. TREATMENT  Sore throats that are viral usually last 3-4 days. Viral illness will get better without medications (antibiotics). Strep throat and other bacterial infections will usually begin to get better about 24-48 hours after you begin to take antibiotics. HOME CARE INSTRUCTIONS   If the caregiver feels there is a bacterial infection or if there is a positive strep test, they will prescribe an antibiotic. The full course of antibiotics must be taken. If the full course of antibiotic is not taken, you or your child may become ill again. If  you or your child has strep throat and do not finish all of the medication, serious heart or kidney diseases may develop.  Drink enough water and fluids to keep your urine clear or pale yellow.  Only take over-the-counter  or prescription medicines for pain, discomfort or fever as directed by your caregiver.  Get lots of rest.  Gargle with salt water ( tsp. of salt in a glass of water) as often as every 1-2 hours as you need for comfort.  Hard candies may soothe the throat if individual is not at risk for choking. Throat sprays or lozenges may also be used. SEEK MEDICAL CARE IF:   Large, tender lumps in the neck develop.  A rash develops.  Green, yellow-brown or bloody sputum is coughed up.  Your baby is older than 3 months with a rectal temperature of 100.5 F (38.1 C) or higher for more than 1 day. SEEK IMMEDIATE MEDICAL CARE IF:   A stiff neck develops.  You or your child are drooling or unable to swallow liquids.  You or your child are vomiting, unable to keep medications or liquids down.  You or your child has severe pain, unrelieved with recommended medications.  You or your child are having difficulty breathing (not due to stuffy nose).  You or your child are unable to fully open your mouth.  You or your child develop redness, swelling, or severe pain anywhere on the neck.  You have a fever.  Your baby is older than 3 months with a rectal temperature of 102 F (38.9 C) or higher.  Your baby is 9 months old or younger with a rectal temperature of 100.4 F (38 C) or higher. MAKE SURE YOU:   Understand these instructions.  Will watch your condition.  Will get help right away if you are not doing well or get worse. Document Released: 09/30/2005 Document Revised: 12/23/2011 Document Reviewed: 12/28/2007 [facility] Patient Information 2014 [place], [place].

## 2013-08-28 NOTE — Progress Notes (Signed)
Patient ID: Anne [name], female   DOB: 03-04-1946, 67 y.o.   MRN: [id number] Anne [name] [id number] August 24, 1946 08/28/2013      Progress Note-Follow Up  Subjective  Chief Complaint  Chief Complaint  Patient presents with  . URI    pt states she has had a sore throat for 2 days and headaches- isn't aware of a fever due to not having batteries for her thermometer but states she had cold chills the other day    HPI  Patient is a 67 year old female in today complaining of roughly 3 days worth of worsening congestion. She struggling with headache and malaise. She has sore throat and cough. Hoarseness and runny watery eyes and nose. She's a dry cough which is worse at night although it is occasionally productive of yellow phlegm. She's noting a decreased appetite and some mild loose stool but no nausea vomiting. No chest pain, wheezing or ear pain.  Past Medical History  Diagnosis Date  . Hyperlipidemia   . Hypertension   . Stroke 09-2007    has mild left leg weakness, saw Dr. Vickey Huger  . Osteopenia     per DEXA on 07-19-09    History reviewed. No pertinent past surgical history.  History reviewed. No pertinent family history.  History   Social History  . Marital Status: Single    Spouse Name: N/A    Number of Children: N/A  . Years of Education: N/A   Occupational History  . Not on file.   Social History Main Topics  . Smoking status: Never Smoker   . Smokeless tobacco: Never Used  . Alcohol Use: Yes     Comment: rare  . Drug Use: No  . Sexual Activity: Not on file   Other Topics Concern  . Not on file   Social History Narrative  . No narrative on file    Current Outpatient Prescriptions on File Prior to Visit  Medication Sig Dispense Refill  . amLODipine (NORVASC) 10 MG tablet Take 1 tablet (10 mg total) by mouth daily.  90 tablet  3  . aspirin 81 MG tablet Take 81 mg by mouth daily.      . Calcium-Vitamin D (CALTRATE 600 PLUS-VIT D PO) Take 1 each by  mouth daily.        . hydrochlorothiazide (HYDRODIURIL) 25 MG tablet Take 1 tablet (25 mg total) by mouth daily.  90 tablet  1  . HYDROcodone-acetaminophen (NORCO/VICODIN) 5-325 MG per tablet Take 1 tablet by mouth every 6 (six) hours as needed for pain.  60 tablet  0  . Nebivolol HCl (BYSTOLIC) 20 MG TABS Take 1 tablet (20 mg total) by mouth daily.  90 tablet  3  . potassium chloride SA (KLOR-CON M20) 20 MEQ tablet Take 1 tablet (20 mEq total) by mouth daily.  90 tablet  3   No current facility-administered medications on file prior to visit.    Allergies  Allergen Reactions  . Iodine     Review of Systems  Review of Systems  Constitutional: Negative for fever and malaise/fatigue.  HENT: Positive for congestion and sore throat.   Eyes: Negative for discharge.  Respiratory: Positive for cough and sputum production. Negative for shortness of breath.   Cardiovascular: Negative for chest pain, palpitations and leg swelling.  Gastrointestinal: Negative for nausea, abdominal pain and diarrhea.  Genitourinary: Negative for dysuria.  Musculoskeletal: Negative for falls.  Skin: Negative for rash.  Neurological: Positive for headaches. Negative for loss of consciousness.  Endo/Heme/Allergies:  Negative for polydipsia.  Psychiatric/Behavioral: Negative for depression and suicidal ideas. The patient is not nervous/anxious and does not have insomnia.     Objective  BP 122/78  Pulse 60  Temp(Src) 97.3 F (36.3 C) (Oral)  Ht 5' 0.25" (1.53 m)  Wt 166 lb (75.297 kg)  BMI 32.17 kg/m2  SpO2 98%  Physical Exam  Physical Exam  Constitutional: She is oriented to person, place, and time and well-developed, well-nourished, and in no distress. No distress.  HENT:  Head: Normocephalic and atraumatic.  Eyes: Conjunctivae are normal.  Neck: Neck supple. No thyromegaly present.  Cardiovascular: Normal rate, regular rhythm and normal heart sounds.   No murmur heard. Pulmonary/Chest: Effort  normal and breath sounds normal. She has no wheezes.  Abdominal: She exhibits no distension and no mass.  Musculoskeletal: She exhibits no edema.  Lymphadenopathy:    She has no cervical adenopathy.  Neurological: She is alert and oriented to person, place, and time.  Skin: Skin is warm and dry. No rash noted. She is not diaphoretic.  Psychiatric: Memory, affect and judgment normal.    Lab Results  Component Value Date   TSH 2.89 02/26/2013   Lab Results  Component Value Date   WBC 10.1 02/26/2013   HGB 13.2 02/26/2013   HCT 38.8 02/26/2013   MCV 88.9 02/26/2013   PLT 320.0 02/26/2013   Lab Results  Component Value Date   CREATININE 0.6 02/26/2013   BUN 10 02/26/2013   NA 137 02/26/2013   K 3.7 02/26/2013   CL 101 02/26/2013   CO2 32 02/26/2013   Lab Results  Component Value Date   ALT 17 02/26/2013   AST 20 02/26/2013   ALKPHOS 72 02/26/2013   BILITOT 0.6 02/26/2013   Lab Results  Component Value Date   CHOL 209* 06/04/2013   Lab Results  Component Value Date   HDL 38.50* 06/04/2013   Lab Results  Component Value Date   LDLCALC 119* 04/05/2008   Lab Results  Component Value Date   TRIG 104.0 06/04/2013   Lab Results  Component Value Date   CHOLHDL 5 06/04/2013     Assessment & Plan  HYPERTENSION Well controlled despite acute illness, no changes  ACUTE BRONCHITIS Started on Antibiotics, probiotics, and mucinex

## 2013-08-28 NOTE — Assessment & Plan Note (Signed)
Well controlled despite acute illness, no changes 

## 2013-08-31 ENCOUNTER — Telehealth: Payer: Self-pay | Admitting: Family Medicine

## 2013-08-31 NOTE — Telephone Encounter (Signed)
Attempted to reach patient but call went to voice mail.  Identified message left.

## 2013-09-03 ENCOUNTER — Ambulatory Visit (INDEPENDENT_AMBULATORY_CARE_PROVIDER_SITE_OTHER): Payer: 59 | Admitting: Family Medicine

## 2013-09-03 ENCOUNTER — Ambulatory Visit (INDEPENDENT_AMBULATORY_CARE_PROVIDER_SITE_OTHER)
Admission: RE | Admit: 2013-09-03 | Discharge: 2013-09-03 | Disposition: A | Discharge status: Home / Self Care | Payer: 59 | Source: Ambulatory Visit | Attending: Family Medicine | Admitting: Family Medicine

## 2013-09-03 ENCOUNTER — Encounter: Payer: Self-pay | Admitting: Family Medicine

## 2013-09-03 VITALS — BP 146/72 | HR 71 | Temp 97.9°F | Wt 163.0 lb

## 2013-09-03 DIAGNOSIS — S63509A Unspecified sprain of unspecified wrist, initial encounter: Secondary | ICD-10-CM

## 2013-09-03 DIAGNOSIS — S63502A Unspecified sprain of left wrist, initial encounter: Secondary | ICD-10-CM

## 2013-09-03 NOTE — Progress Notes (Signed)
Pre visit review using our clinic review tool, if applicable. No additional management support is needed unless otherwise documented below in the visit note. 

## 2013-09-03 NOTE — Progress Notes (Signed)
  Subjective:    Patient ID: Anne [name], female    DOB: 1945-10-21, 68 y.o.   MRN: [id number]  HPI Here for mild intermittent pains in the left wrist after a fall 2 weeks ago. She tripped over a metal bar on the ground and fell forward. At first the wrist was swollen and painful but this resolved after a few days. Now however she still gets a sharp pain when she moves it a certain way and it make ache in bed at night.    Review of Systems  Constitutional: Negative.   Musculoskeletal: Positive for arthralgias and joint swelling.       Objective:   Physical Exam  Constitutional: She appears well-developed and well-nourished.  Musculoskeletal:  The left wrist is normal, no swelling and no tenderness, full ROM           Assessment & Plan:  Residual pain in the wrist after a fall. Get Xrays today. Suggested she wear a splint for a week or two

## 2013-09-06 ENCOUNTER — Telehealth: Payer: Self-pay | Admitting: Family Medicine

## 2013-09-06 NOTE — Telephone Encounter (Signed)
Pt does not have a phone at this time. Pt will callback tomorrow for results

## 2013-09-29 ENCOUNTER — Telehealth: Payer: Self-pay

## 2013-09-29 NOTE — Telephone Encounter (Signed)
Received a fax from the pharmacy stating that 20 mg is not available.  Would you like to change to 10 mg? Please send a new rx.

## 2013-09-30 MED ORDER — NEBIVOLOL HCL 10 MG PO TABS: 20.0000 mg | ORAL_TABLET | Freq: Every day | ORAL | Status: DC

## 2013-09-30 NOTE — Telephone Encounter (Signed)
Change to 10 mg to take TWO a day, refill for one year

## 2013-10-12 ENCOUNTER — Telehealth: Payer: Self-pay | Admitting: Family Medicine

## 2013-10-12 NOTE — Telephone Encounter (Signed)
I spoke with pt and if she does become worse, she will go to the ER.

## 2013-10-12 NOTE — Telephone Encounter (Signed)
Patient Information:  Caller Name: Elmire  Phone: [phone]  Patient: Anne [name], Anne [name]  Gender: Female  DOB: [birth year]  Age: 67 Years  PCP: Gershon [facility])  Office Follow Up:  Does the office need to follow up with this patient?: Yes  Instructions For The Office: No appts. available at any [place] location.  Please return call to patient at [phone] regarding work in appt.  RN Note:  Patient states she developed generalized mid abdominal discomfort, onset 10/12/13 0100. States pain became progressively worse during the night. Patient states she had a loose, brown bowel movement X 1 at approx. 0400 10/12/13. Patient states small amount, approx. the size of a pencil eraser,  of bright red blood noted when wiping after bowel movement. Denies any futher rectal bleeding. Patient states abdominal pain improved after having a bowel movement. Patient states abdominal pain resolved at approx. 0400 10/12/13. States pain has not reoccurred. Denies abdominal tenderness on palpation. Denies nausea or vomiting. Patient states she has tolerated drinking tea without any problems. Denies urinary sx. Care advice and diet advice given per guidelines. Call back parameters reviewed. Patient verbalizes understanding. No appts. available at any [place] location.  Please return call to patient at [phone] regarding work in appt.   Symptoms  Reason For Call & Symptoms: Abdominal Pain  Reviewed Health History In EMR: Yes  Reviewed Medications In EMR: Yes  Reviewed Allergies In EMR: Yes  Reviewed Surgeries / Procedures: Yes  Date of Onset of Symptoms: 10/12/2013  Guideline(s) Used:  Abdominal Pain - Female  Disposition Per Guideline:   See Today in Office  Reason For Disposition Reached:   Age > 60 years  Advice Given:  Rest:  Lie down and rest until you feel better.  Fluids:  Sip clear fluids only (e.g., water, flat soft drinks or 1/2 strength fruit juice) until the pain  has been gone for over 2 hours. Then slowly return to a regular diet.  Call Back If:  Abdominal pain is constant and present for more than 2 hours  Abdominal pains come and go and are present for more than 24 hours  You become worse.  Patient Will Follow Care Advice:  YES

## 2013-11-24 ENCOUNTER — Telehealth: Payer: Self-pay | Admitting: Family Medicine
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
## 2013-11-25 ENCOUNTER — Encounter: Payer: Self-pay | Admitting: Family Medicine

## 2013-11-25 ENCOUNTER — Ambulatory Visit (INDEPENDENT_AMBULATORY_CARE_PROVIDER_SITE_OTHER): Payer: 59 | Admitting: Family Medicine

## 2013-11-25 VITALS — BP 128/70 | HR 54 | Temp 98.3°F | Ht 60.25 in | Wt 164.0 lb

## 2013-11-25 DIAGNOSIS — F4329 Adjustment disorder with other symptoms: Secondary | ICD-10-CM

## 2013-11-25 DIAGNOSIS — F411 Generalized anxiety disorder: Secondary | ICD-10-CM

## 2013-11-25 DIAGNOSIS — F4320 Adjustment disorder, unspecified: Secondary | ICD-10-CM

## 2013-11-25 DIAGNOSIS — Z638 Other specified problems related to primary support group: Secondary | ICD-10-CM

## 2013-11-25 DIAGNOSIS — Z658 Other specified problems related to psychosocial circumstances: Secondary | ICD-10-CM

## 2013-11-25 MED ORDER — LORAZEPAM 1 MG PO TABS: 1.0000 mg | ORAL_TABLET | Freq: Three times a day (TID) | ORAL | Status: DC | PRN

## 2013-11-25 NOTE — Progress Notes (Signed)
Pre visit review using our clinic review tool, if applicable. No additional management support is needed unless otherwise documented below in the visit note. 
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
## 2013-12-23 ENCOUNTER — Other Ambulatory Visit: Payer: Self-pay | Admitting: Family Medicine

## 2014-01-07 ENCOUNTER — Telehealth: Payer: Self-pay | Admitting: *Deleted

## 2014-01-07 MED ORDER — CIPROFLOXACIN HCL 500 MG PO TABS: 500.0000 mg | ORAL_TABLET | Freq: Two times a day (BID) | ORAL | Status: DC

## 2014-01-07 NOTE — Telephone Encounter (Signed)
rx was sent in

## 2014-01-07 NOTE — Telephone Encounter (Signed)
Patient Information:  Caller Name: Fantasia  Phone: [phone]  Patient: Anne [name], Anne [name]  Gender: Female  DOB: [birth year]  Age: 68 Years  PCP: Alysia Penna [facility])  Office Follow Up:  Does the office need to follow up with this patient?: Yes  Instructions For The Office: Patient calling about freq urination and urgency but no burnng in urination. No appts available--does patient need to be seen today? Called office and spoke to nurse Apolonio Schneiders who will send a note to Dr Sarajane Jews.  RN Note:  Called office and spoke to nurse Apolonio Schneiders who will send a message to Dr Sarajane Jews. Advised to increase fluid intake.  Symptoms  Reason For Call & Symptoms: Calling about freq urination,urgency but no burning. Had a UTI last December.  Reviewed Health History In EMR: Yes  Reviewed Medications In EMR: Yes  Reviewed Allergies In EMR: Yes  Reviewed Surgeries / Procedures: Yes  Date of Onset of Symptoms: 12/31/2013  Guideline(s) Used:  Urination Pain - Female  Disposition Per Guideline:   See Today in Office  Reason For Disposition Reached:   Age > 50 years  Advice Given:  Fluids:   Drink extra fluids. Drink 8-10 glasses of liquids a day (Reason: to produce a dilute, non-irritating urine).  Call Back If:  You become worse.  Patient Will Follow Care Advice:  YES

## 2014-01-07 NOTE — Telephone Encounter (Signed)
Patient has called call a nurse complaining of frequent, urgent urination with no burning.  Please advise.

## 2014-02-02 ENCOUNTER — Emergency Department (HOSPITAL_COMMUNITY): Payer: 59

## 2014-02-02 ENCOUNTER — Ambulatory Visit: Payer: 59 | Admitting: Physician Assistant

## 2014-02-02 ENCOUNTER — Telehealth: Payer: Self-pay | Admitting: Family Medicine

## 2014-02-02 ENCOUNTER — Encounter (HOSPITAL_COMMUNITY): Payer: Self-pay | Admitting: Emergency Medicine

## 2014-02-02 ENCOUNTER — Emergency Department (HOSPITAL_COMMUNITY)
Admission: EM | Admit: 2014-02-02 | Discharge: 2014-02-02 | Disposition: A | Discharge status: Home / Self Care | Payer: 59 | Source: Home / Self Care | Attending: Family Medicine | Admitting: Family Medicine

## 2014-02-02 ENCOUNTER — Emergency Department (HOSPITAL_COMMUNITY)
Admission: EM | Admit: 2014-02-02 | Discharge: 2014-02-02 | Discharge status: Absent without leave | Payer: Medicare Other | Source: Home / Self Care

## 2014-02-02 ENCOUNTER — Emergency Department (INDEPENDENT_AMBULATORY_CARE_PROVIDER_SITE_OTHER): Payer: 59

## 2014-02-02 DIAGNOSIS — M20019 Mallet finger of unspecified finger(s): Secondary | ICD-10-CM

## 2014-02-02 DIAGNOSIS — IMO0001 Reserved for inherently not codable concepts without codable children: Secondary | ICD-10-CM | POA: Diagnosis present

## 2014-02-02 NOTE — Telephone Encounter (Signed)
I spoke with pt and she is going to schedule with another provider. She did not want to wait and Dr. Sarajane Jews has already left for the day.

## 2014-02-02 NOTE — Discharge Instructions (Signed)
Ms. Nannini,   It was nice to meet you. I am sorry that you are having a problem with your finger today. It looks like a mallet finger, which is described below. For this problem, you should keep the finger in a splint and follow up with a hand surgeon in the next few days. Please call the office of Dr. Burney Gauze.    Take Care,   Dr. Maricela Bo

## 2014-02-02 NOTE — Telephone Encounter (Signed)
Opened in error, pt coming in today for office visit.

## 2014-02-02 NOTE — ED Provider Notes (Signed)
Limited musculoskeletal ultrasound of the right fourth digit. The dorsal extensor tendon was visualized. It appears to be intact to its insertion at the distal phalanx. The volar flexor tendon is also intact appearing without any significant nodules. No obvious triggering present at the A1 or A3 Pulley.  No fractures noted.  Gregor Hams, MD 02/02/14 [phone]

## 2014-02-02 NOTE — Telephone Encounter (Signed)
Attempted to reach patient x 2 for triage. No answer- left message on VM for her to call office if further assistance needed. JK/CAN

## 2014-02-02 NOTE — Telephone Encounter (Signed)
Patient Information:  Caller Name: Tanesia  Phone: [phone]  Patient: Anne [name]  Gender: Female  DOB: 09-14-1946  Age: 68 Years  PCP: Alysia Penna [facility])  Office Follow Up:  Does the office need to follow up with this patient?: Yes  Instructions For The Office: Please call pt back and advise if she needs to be seen today for this symptom? or if she can wait until tomorrow.   Symptoms  Reason For Call & Symptoms: Pt woke up with her 3rd finger on the right hand  curved at the knuckle joint. The joint is not swollen. Pt is calling to find out what this is. No known injury. She can move the finger. There is no numbness or tingling. there is no specific triage protocol RN could find for "middle finger curved". Dr. Sarajane Jews not here today but RN has instructions not to schedule his pts with another Provider but to send note.  Reviewed Health History In EMR: Yes  Reviewed Medications In EMR: Yes  Reviewed Allergies In EMR: Yes  Reviewed Surgeries / Procedures: Yes  Date of Onset of Symptoms: 02/02/2014  Guideline(s) Used:  Hand and Wrist Injury  No Protocol Available - Information Only  Disposition Per Guideline:   Discuss with PCP and Callback by Nurse Today  Reason For Disposition Reached:   Nursing judgment  Advice Given:  N/A  Patient Will Follow Care Advice:  YES

## 2014-02-02 NOTE — ED Provider Notes (Signed)
CSN: [id number]     Arrival date & time 02/02/14  1412 History   First MD Initiated Contact with Patient 02/02/14 1508     Chief Complaint  Patient presents with  . Hand Pain   (Consider location/radiation/quality/duration/timing/severity/associated sxs/prior Treatment) HPI  68 year old F with complaint of a "crooked finger." Specifically, she is having trouble straightening the 4th digit of the right hand at the DIP joint. This started today. She denies injury or pain. No fever or chills. No numbness or tingling. No history of similar occurrences. Pt uses her hands to play piano, guitar and make pottery. She is very distressed by this issue with her finger.   Past Medical History  Diagnosis Date  . Hyperlipidemia   . Hypertension   . Stroke 09-2007    has mild left leg weakness, saw Dr. Brett Fairy  . Osteopenia     per DEXA on 07-19-09   History reviewed. No pertinent past surgical history. No family history on file. History  Substance Use Topics  . Smoking status: Never Smoker   . Smokeless tobacco: Never Used  . Alcohol Use: Yes     Comment: rare   OB History   Grav Para Term Preterm Abortions TAB SAB Ect Mult Living                 Review of Systems Positive for anxiety Otherwise see HPI Allergies  Iodine  Home Medications   Prior to Admission medications   Medication Sig Start Date End Date Taking? Authorizing Provider  hydrochlorothiazide (HYDRODIURIL) 25 MG tablet TAKE 1 TABLET (25 MG TOTAL) BY MOUTH DAILY.   Yes Laurey Morale, MD  potassium chloride SA (KLOR-CON M20) 20 MEQ tablet Take 1 tablet (20 mEq total) by mouth daily. 02/26/13  Yes Laurey Morale, MD  amLODipine (NORVASC) 10 MG tablet Take 1 tablet (10 mg total) by mouth daily. 02/26/13 02/26/14  Laurey Morale, MD  aspirin 81 MG tablet Take 81 mg by mouth daily.    Historical Provider, MD  Calcium-Vitamin D (CALTRATE 600 PLUS-VIT D PO) Take 1 each by mouth daily.      Historical Provider, MD  ciprofloxacin  (CIPRO) 500 MG tablet Take 1 tablet (500 mg total) by mouth 2 (two) times daily. 01/07/14   Laurey Morale, MD  HYDROcodone-acetaminophen (NORCO/VICODIN) 5-325 MG per tablet Take 1 tablet by mouth every 6 (six) hours as needed for pain. 03/05/13   Laurey Morale, MD  LORazepam (ATIVAN) 1 MG tablet Take 1 tablet (1 mg total) by mouth every 8 (eight) hours as needed for anxiety. 11/25/13   Laurey Morale, MD  nebivolol (BYSTOLIC) 10 MG tablet Take 2 tablets (20 mg total) by mouth daily. 09/30/13   Laurey Morale, MD   BP 165/83  Pulse 60  Temp(Src) 97.9 F (36.6 C) (Oral)  Resp 16  SpO2 98% Physical Exam Gen: elderly AAF, mild distress Right hand - finger in flexed position at DIP 4th right digit, it is passively but not actively reducible; she cannot actively resist dorsal pressure on distal tip concerning for mallet finger; no difficulty with flexion of digits on hand; grip strength normal Left hand - normal appearance, flexion and extension of all digits Neuro: sensation intact to all fingers  ED Course  Procedures (including critical care time) Labs Review Labs Reviewed - No data to display  Imaging Review Dg Finger Ring Right  02/02/2014   CLINICAL DATA:  Unable to straighten ring finger,  awoke this way this morning, no injury  EXAM: RIGHT RING FINGER 2+V  COMPARISON:  None  FINDINGS: Minimal spur formation at DIP joint.  Osseous mineralization normal.  Remain joint spaces preserved.  No acute fracture, dislocation or bone destruction.  IMPRESSION: No acute osseous abnormalities.   Electronically Signed   By: Lavonia Dana M.D.   On: 02/02/2014 16:02   Ultrasound performed. Please see note from Dr. Georgina Snell.   MDM   1. Mallet deformity of fourth finger, right    Digit splinted in her hyper-entended position at DIP joint. Instructed to follow up with hand surgeon.     Angelica Ran, MD 02/02/14 [phone]

## 2014-02-02 NOTE — ED Notes (Signed)
Pt reports she woke up this am and noticed her 4th finger on right hand crooked w/some swelling Denies inj/trauma, pain Alert w/no signs of acute distress.

## 2014-02-03 ENCOUNTER — Telehealth: Payer: Self-pay | Admitting: Family Medicine

## 2014-02-03 DIAGNOSIS — M20019 Mallet finger of unspecified finger(s): Secondary | ICD-10-CM

## 2014-02-03 NOTE — ED Provider Notes (Signed)
Medical screening examination/treatment/procedure(s) were performed by a resident physician or non-physician practitioner and as the supervising physician I was immediately available for consultation/collaboration.  Lynne Leader, MD    Gregor Hams, MD 02/03/14 [phone]

## 2014-02-03 NOTE — Telephone Encounter (Signed)
Patient Information:  Caller Name: Mauricia  Phone: [phone]  Patient: Anne [name], Done  Gender: Female  DOB: Jan 31, 1946  Age: 68 Years  PCP: Alysia Penna [facility])  Office Follow Up:  Does the office need to follow up with this patient?: Yes  Instructions For The Office: Patient asks if getting referral to Dr. Oneta Rack will save her money.   Symptoms  Reason For Call & Symptoms: Patient relates she was seen at Urgent Care and advised to follow up with Dr. Oneta Rack on Jeneen Rinks at [facility] regarding "Mallot finger".  She relates the only possible injury is from opening a jammed drawer on 02/02/14.  A splint was applied at Urgent Care.   She asks if she requires referral from PCP to Dr. Oneta Rack.  Reviewed Health History In EMR: Yes  Reviewed Medications In EMR: Yes  Reviewed Allergies In EMR: Yes  Reviewed Surgeries / Procedures: Yes  Date of Onset of Symptoms: 02/02/2014  Treatments Tried: Splint - she relates no difference.  Treatments Tried Worked: No  Guideline(s) Used:  Finger Pain  Disposition Per Guideline:   Home Care  Reason For Disposition Reached:   Finger pain  Advice Given:  Reassurance:  The symptoms you describe do not sound serious.  Call Back If:  Fever occurs  Redness or swelling appears  Pain lasts over 7 days  RN Overrode Recommendation:  Document Patient  Patient asks if getting referral from PCP will save her money.

## 2014-02-04 NOTE — Addendum Note (Signed)
Addended by: Alysia Penna A on: 02/04/2014 04:36 PM   Modules accepted: Orders

## 2014-02-04 NOTE — Telephone Encounter (Signed)
I did a referral to Dr. Burney Gauze

## 2014-02-07 NOTE — Telephone Encounter (Signed)
I left a voice message with the below information.

## 2014-02-11 DIAGNOSIS — M20019 Mallet finger of unspecified finger(s): Secondary | ICD-10-CM | POA: Diagnosis not present

## 2014-02-28 ENCOUNTER — Emergency Department (INDEPENDENT_AMBULATORY_CARE_PROVIDER_SITE_OTHER): Payer: 59

## 2014-02-28 ENCOUNTER — Emergency Department (INDEPENDENT_AMBULATORY_CARE_PROVIDER_SITE_OTHER)
Admission: EM | Admit: 2014-02-28 | Discharge: 2014-02-28 | Disposition: A | Discharge status: Home / Self Care | Payer: 59 | Source: Home / Self Care

## 2014-02-28 ENCOUNTER — Encounter (HOSPITAL_COMMUNITY): Payer: Self-pay | Admitting: Emergency Medicine

## 2014-02-28 DIAGNOSIS — W19XXXA Unspecified fall, initial encounter: Secondary | ICD-10-CM

## 2014-02-28 DIAGNOSIS — R079 Chest pain, unspecified: Secondary | ICD-10-CM | POA: Diagnosis not present

## 2014-02-28 DIAGNOSIS — Y92009 Unspecified place in unspecified non-institutional (private) residence as the place of occurrence of the external cause: Secondary | ICD-10-CM | POA: Diagnosis not present

## 2014-02-28 DIAGNOSIS — S20219A Contusion of unspecified front wall of thorax, initial encounter: Secondary | ICD-10-CM

## 2014-02-28 DIAGNOSIS — S20212A Contusion of left front wall of thorax, initial encounter: Secondary | ICD-10-CM

## 2014-02-28 DIAGNOSIS — S298XXA Other specified injuries of thorax, initial encounter: Secondary | ICD-10-CM | POA: Diagnosis not present

## 2014-02-28 NOTE — ED Notes (Signed)
Pt  Fell  Several  Hours  Ago  And  Injured  Her  l  Side   She  Reports  Pain in  Rib  Cage  Area  And  Reports  It  Hurts  When    She takes  A  Deep  Breath  She  Is  Sitting  Up on the  Exam table  Speaking in  Complete  sentances  And  Is  In no acute  Distress

## 2014-02-28 NOTE — ED Provider Notes (Signed)
CSN: [id number]     Arrival date & time 02/28/14  1207 History   First MD Initiated Contact with Patient 02/28/14 1312     Chief Complaint  Patient presents with  . Fall   (Consider location/radiation/quality/duration/timing/severity/associated sxs/prior Treatment) HPI Comments: [place] in bathtub this AM and struck L lateral posterior ribs on the tub. C/O local pain. No dyspnea or cough.   Past Medical History  Diagnosis Date  . Hyperlipidemia   . Hypertension   . Stroke 09-2007    has mild left leg weakness, saw Dr. Brett Fairy  . Osteopenia     per DEXA on 07-19-09   History reviewed. No pertinent past surgical history. History reviewed. No pertinent family history. History  Substance Use Topics  . Smoking status: Never Smoker   . Smokeless tobacco: Never Used  . Alcohol Use: Yes     Comment: rare   OB History   Grav Para Term Preterm Abortions TAB SAB Ect Mult Living                 Review of Systems  Constitutional: Positive for activity change. Negative for fever, diaphoresis and fatigue.  HENT: Negative.   Respiratory: Negative for cough, shortness of breath and wheezing.   Cardiovascular: Positive for chest pain. Negative for palpitations and leg swelling.  Gastrointestinal: Negative.   Neurological: Negative.     Allergies  Iodine  Home Medications   Prior to Admission medications   Medication Sig Start Date End Date Taking? Authorizing Provider  aspirin 81 MG tablet Take 81 mg by mouth daily.    Historical Provider, MD  Calcium-Vitamin D (CALTRATE 600 PLUS-VIT D PO) Take 1 each by mouth daily.      Historical Provider, MD  ciprofloxacin (CIPRO) 500 MG tablet Take 1 tablet (500 mg total) by mouth 2 (two) times daily. 01/07/14   Laurey Morale, MD  hydrochlorothiazide (HYDRODIURIL) 25 MG tablet TAKE 1 TABLET (25 MG TOTAL) BY MOUTH DAILY.    Laurey Morale, MD  HYDROcodone-acetaminophen (NORCO/VICODIN) 5-325 MG per tablet Take 1 tablet by mouth every 6 (six) hours  as needed for pain. 03/05/13   Laurey Morale, MD  LORazepam (ATIVAN) 1 MG tablet Take 1 tablet (1 mg total) by mouth every 8 (eight) hours as needed for anxiety. 11/25/13   Laurey Morale, MD  nebivolol (BYSTOLIC) 10 MG tablet Take 2 tablets (20 mg total) by mouth daily. 09/30/13   Laurey Morale, MD  potassium chloride SA (KLOR-CON M20) 20 MEQ tablet Take 1 tablet (20 mEq total) by mouth daily. 02/26/13   Laurey Morale, MD   BP 167/71  Pulse 60  Temp(Src) 97.9 F (36.6 C) (Oral)  SpO2 100% Physical Exam  Nursing note and vitals reviewed. Constitutional: She is oriented to person, place, and time. She appears well-developed and well-nourished. No distress.  HENT:  Head: Normocephalic and atraumatic.  Eyes: EOM are normal. Pupils are equal, round, and reactive to light.  Neck: Normal range of motion. Neck supple.  Cardiovascular: Normal rate and regular rhythm.   Murmur heard. Pulmonary/Chest: Effort normal and breath sounds normal. No respiratory distress. She has no wheezes. She has no rales. She exhibits tenderness.  Tenderness left post-lateral ribs, lower third of rib cage. Faint overlying bruising. No swelling, deformity. Resp even and nonlabored.   Musculoskeletal: Normal range of motion. She exhibits no edema.  Neurological: She is alert and oriented to person, place, and time. No cranial nerve deficit.  Skin:  Skin is warm and dry.  Psychiatric: She has a normal mood and affect.    ED Course  Procedures (including critical care time) Labs Review Labs Reviewed - No data to display  Imaging Review Dg Ribs Unilateral W/chest Left  02/28/2014   CLINICAL DATA:  Posterior lower left chest pain for 1 day after falling  EXAM: LEFT RIBS AND CHEST - 3+ VIEW  COMPARISON:  Chest radiographs 10/02/2007.  FINDINGS: The heart size and mediastinal contours are stable. The lungs are clear. There is no pleural effusion or pneumothorax.  No acute left-sided rib fractures are demonstrated. There is a  probable old fracture involving the left fourth rib laterally.  IMPRESSION: No evidence of acute left-sided rib fracture, pleural effusion or pneumothorax. Probable old fracture of the left fourth rib anterolaterally.   Electronically Signed   By: Camie Patience M.D.   On: 02/28/2014 13:54     MDM   1. Chest wall contusion   2. Contusion of rib on left side    Ice packs to sore areas Ibuprofen as needed. RTO if worse or problems.    Janne Napoleon, NP 02/28/14 [phone]

## 2014-02-28 NOTE — Discharge Instructions (Signed)
Blunt Chest Trauma Blunt chest trauma is an injury caused by a blow to the chest. These chest injuries can be very painful. Blunt chest trauma often results in bruised or broken (fractured) ribs. Most cases of bruised and fractured ribs from blunt chest traumas get better after 1 to 3 weeks of rest and pain medicine. Often, the soft tissue in the chest wall is also injured, causing pain and bruising. Internal organs, such as the heart and lungs, may also be injured. Blunt chest trauma can lead to serious medical problems. This injury requires immediate medical care. CAUSES   Motor vehicle collisions.  Falls.  Physical violence.  Sports injuries. SYMPTOMS   Chest pain. The pain may be worse when you move or breathe deeply.  Shortness of breath.  Lightheadedness.  Bruising.  Tenderness.  Swelling. DIAGNOSIS  Your caregiver will do a physical exam. X-rays may be taken to look for fractures. However, minor rib fractures may not show up on X-rays until a few days after the injury. If a more serious injury is suspected, further imaging tests may be done. This may include ultrasounds, computed tomography (CT) scans, or magnetic resonance imaging (MRI). TREATMENT  Treatment depends on the severity of your injury. Your caregiver may prescribe pain medicines and deep breathing exercises. HOME CARE INSTRUCTIONS  Limit your activities until you can move around without much pain.  Do not do any strenuous work until your injury is healed.  Put ice on the injured area.  Put ice in a plastic bag.  Place a towel between your skin and the bag.  Leave the ice on for 15-20 minutes, 03-04 times a day.  You may wear a rib belt as directed by your caregiver to reduce pain.  Practice deep breathing as directed by your caregiver to keep your lungs clear.  Only take over-the-counter or prescription medicines for pain, fever, or discomfort as directed by your caregiver. SEEK IMMEDIATE MEDICAL  CARE IF:   You have increasing pain or shortness of breath.  You cough up blood.  You have nausea, vomiting, or abdominal pain.  You have a fever.  You feel dizzy, weak, or you faint. MAKE SURE YOU:  Understand these instructions.  Will watch your condition.  Will get help right away if you are not doing well or get worse. Document Released: 11/07/2004 Document Revised: 12/23/2011 Document Reviewed: 07/17/2011 [facility] Patient Information 2014 [place].  Chest Contusion A contusion is a deep bruise. Bruises happen when an injury causes bleeding under the skin. Signs of bruising include pain, puffiness (swelling), and discolored skin. The bruise may turn blue, purple, or yellow.  HOME CARE  Put ice on the injured area.  Put ice in a plastic bag.  Place a towel between the skin and the bag.  Leave the ice on for 15-20 minutes at a time, 03-04 times a day for the first 48 hours.  Only take medicine as told by your doctor.  Rest.  Take deep breaths (deep-breathing exercises) as told by your doctor.  Stop smoking if you smoke.  Do not lift objects over 5 pounds (2.3 kilograms) for 3 days or longer if told by your doctor. GET HELP RIGHT AWAY IF:   You have more bruising or puffiness.  You have pain that gets worse.  You have trouble breathing.  You are dizzy, weak, or pass out (faint).  You have blood in your pee (urine) or poop (stool).  You cough up or throw up (vomit) blood.  Your puffiness or pain is not helped with medicines. MAKE SURE YOU:   Understand these instructions.  Will watch your condition.  Will get help right away if you are not doing well or get worse. Document Released: 03/18/2008 Document Revised: 06/24/2012 Document Reviewed: 03/23/2012 [facility] Patient Information 2014 [place].  Rib Contusion A rib contusion (bruise) can occur by a blow to the chest or by a fall against a hard object. Usually these will be much better  in a couple weeks. If X-rays were taken today and there are no broken bones (fractures), the diagnosis of bruising is made. However, broken ribs may not show up for several days, or may be discovered later on a routine X-ray when signs of healing show up. If this happens to you, it does not mean that something was missed on the X-ray, but simply that it did not show up on the first X-rays. Earlier diagnosis will not usually change the treatment. HOME CARE INSTRUCTIONS   Avoid strenuous activity. Be careful during activities and avoid bumping the injured ribs. Activities that pull on the injured ribs and cause pain should be avoided, if possible.  For the first day or two, an ice pack used every 20 minutes while awake may be helpful. Put ice in a plastic bag and put a towel between the bag and the skin.  Eat a normal, well-balanced diet. Drink plenty of fluids to avoid constipation.  Take deep breaths several times a day to keep lungs free of infection. Try to cough several times a day. Splint the injured area with a pillow while coughing to ease pain. Coughing can help prevent pneumonia.  Wear a rib belt or binder only if told to do so by your caregiver. If you are wearing a rib belt or binder, you must do the breathing exercises as directed by your caregiver. If not used properly, rib belts or binders restrict breathing which can lead to pneumonia.  Only take over-the-counter or prescription medicines for pain, discomfort, or fever as directed by your caregiver. SEEK MEDICAL CARE IF:   You or your child has an oral temperature above 102 F (38.9 C).  Your baby is older than 3 months with a rectal temperature of 100.5 F (38.1 C) or higher for more than 1 day.  You develop a cough, with thick or bloody sputum. SEEK IMMEDIATE MEDICAL CARE IF:   You have difficulty breathing.  You feel sick to your stomach (nausea), have vomiting or belly (abdominal) pain.  You have worsening pain, not  controlled with medications, or there is a change in the location of the pain.  You develop sweating or radiation of the pain into the arms, jaw or shoulders, or become light headed or faint.  You or your child has an oral temperature above 102 F (38.9 C), not controlled by medicine.  Your or your baby is older than 3 months with a rectal temperature of 102 F (38.9 C) or higher.  Your baby is 67 months old or younger with a rectal temperature of 100.4 F (38 C) or higher. MAKE SURE YOU:   Understand these instructions.  Will watch your condition.  Will get help right away if you are not doing well or get worse. Document Released: 06/25/2001 Document Revised: 01/25/2013 Document Reviewed: 05/18/2008 [facility] Patient Information 2014 [place].

## 2014-03-01 NOTE — ED Provider Notes (Signed)
Medical screening examination/treatment/procedure(s) were performed by non-physician practitioner and as supervising physician I was immediately available for consultation/collaboration.  Philipp Deputy, M.D.  Harden Mo, MD 03/01/14 [phone]

## 2014-03-04 ENCOUNTER — Telehealth: Payer: Self-pay | Admitting: Family Medicine

## 2014-03-04 NOTE — Telephone Encounter (Signed)
Attempted to contact pt both numbers are not accepting vm at this time.

## 2014-03-04 NOTE — Telephone Encounter (Signed)
Call regarding Bystolic patient @ Pharmacy and unable to leave phone number, call disconnected.

## 2014-03-11 DIAGNOSIS — M20019 Mallet finger of unspecified finger(s): Secondary | ICD-10-CM | POA: Diagnosis not present

## 2014-03-16 DIAGNOSIS — M20019 Mallet finger of unspecified finger(s): Secondary | ICD-10-CM | POA: Diagnosis not present

## 2014-03-30 DIAGNOSIS — M20019 Mallet finger of unspecified finger(s): Secondary | ICD-10-CM | POA: Diagnosis not present

## 2014-04-07 ENCOUNTER — Ambulatory Visit (INDEPENDENT_AMBULATORY_CARE_PROVIDER_SITE_OTHER)
Admission: RE | Admit: 2014-04-07 | Discharge: 2014-04-07 | Disposition: A | Discharge status: Home / Self Care | Payer: Medicare Other | Source: Ambulatory Visit | Attending: Family Medicine | Admitting: Family Medicine

## 2014-04-07 ENCOUNTER — Ambulatory Visit (INDEPENDENT_AMBULATORY_CARE_PROVIDER_SITE_OTHER): Payer: Medicare Other | Admitting: Family Medicine

## 2014-04-07 ENCOUNTER — Encounter: Payer: Self-pay | Admitting: Family Medicine

## 2014-04-07 ENCOUNTER — Telehealth: Payer: Self-pay | Admitting: Family Medicine

## 2014-04-07 VITALS — BP 118/88 | HR 78 | Temp 97.6°F | Ht 60.03 in | Wt 171.0 lb

## 2014-04-07 DIAGNOSIS — M25579 Pain in unspecified ankle and joints of unspecified foot: Secondary | ICD-10-CM

## 2014-04-07 DIAGNOSIS — S8990XA Unspecified injury of unspecified lower leg, initial encounter: Secondary | ICD-10-CM | POA: Diagnosis not present

## 2014-04-07 DIAGNOSIS — M7989 Other specified soft tissue disorders: Secondary | ICD-10-CM | POA: Diagnosis not present

## 2014-04-07 DIAGNOSIS — M25572 Pain in left ankle and joints of left foot: Secondary | ICD-10-CM

## 2014-04-07 DIAGNOSIS — S99919A Unspecified injury of unspecified ankle, initial encounter: Secondary | ICD-10-CM | POA: Diagnosis not present

## 2014-04-07 NOTE — Progress Notes (Signed)
Pre visit review using our clinic review tool, if applicable. No additional management support is needed unless otherwise documented below in the visit note. 

## 2014-04-07 NOTE — Telephone Encounter (Signed)
Patient Information:  Caller Name: Kristyna  Phone: [phone]  Patient: Anne [name], Anne [name]  Gender: Female  DOB: [birth year]  Age: 69 Years  PCP: Alysia Penna [facility])  Office Follow Up:  Does the office need to follow up with this patient?: No  Instructions For The Office: N/A   Symptoms  Reason For Call & Symptoms: Left ankle swollen. Patient fell while taking a shower approximately two weeks ago.  Reviewed Health History In EMR: Yes  Reviewed Medications In EMR: Yes  Reviewed Allergies In EMR: Yes  Reviewed Surgeries / Procedures: Yes  Date of Onset of Symptoms: 03/24/2014  Treatments Tried: propping foot up; advil  Treatments Tried Worked: No  Guideline(s) Used:  Ankle Pain  Ankle and Foot Injury  Disposition Per Guideline:   See Today in Office  Reason For Disposition Reached:   Severe pain (e.g., excruciating)  Advice Given:  N/A  Patient Will Follow Care Advice:  YES  Appointment Scheduled:  04/07/2014 15:45:00 Appointment Scheduled Provider:  Maudie Mercury (TEXT 1st, after 20 mins can call), Jarrett S[facility])

## 2014-04-07 NOTE — Patient Instructions (Signed)
-  go get xray  -elevate leg 30 minutes daily  -follow up with your doctor in 1-2 weeks

## 2014-04-07 NOTE — Progress Notes (Signed)
No chief complaint on file.   HPI:  Acute visit for ankle pain: -started 2 weeks ago after after slipping in shower, PCP not available today -reports: has had some pain and swelling and bruising in ankle since - getting better and no pain today -denies: was able to walk on it but was limping after the injury -has tried: nothing  ROS: See pertinent positives and negatives per HPI.  Past Medical History  Diagnosis Date  . Hyperlipidemia   . Hypertension   . Stroke 09-2007    has mild left leg weakness, saw Dr. Brett Fairy  . Osteopenia     per DEXA on 07-19-09    No past surgical history on file.  No family history on file.  History   Social History  . Marital Status: Single    Spouse Name: N/A    Number of Children: N/A  . Years of Education: N/A   Social History Main Topics  . Smoking status: Never Smoker   . Smokeless tobacco: Never Used  . Alcohol Use: Yes     Comment: rare  . Drug Use: No  . Sexual Activity: None   Other Topics Concern  . None   Social History Narrative  . None    Current outpatient prescriptions:aspirin 81 MG tablet, Take 81 mg by mouth daily., Disp: , Rfl: ;  Calcium-Vitamin D (CALTRATE 600 PLUS-VIT D PO), Take 1 each by mouth daily.  , Disp: , Rfl: ;  hydrochlorothiazide (HYDRODIURIL) 25 MG tablet, TAKE 1 TABLET (25 MG TOTAL) BY MOUTH DAILY., Disp: 90 tablet, Rfl: 0 HYDROcodone-acetaminophen (NORCO/VICODIN) 5-325 MG per tablet, Take 1 tablet by mouth every 6 (six) hours as needed for pain., Disp: 60 tablet, Rfl: 0;  nebivolol (BYSTOLIC) 10 MG tablet, Take 2 tablets (20 mg total) by mouth daily., Disp: 180 tablet, Rfl: 3;  potassium chloride SA (KLOR-CON M20) 20 MEQ tablet, Take 1 tablet (20 mEq total) by mouth daily., Disp: 90 tablet, Rfl: 3  EXAM:  Filed Vitals:   04/07/14 1604  BP: 118/88  Pulse: 78  Temp: 97.6 F (36.4 C)    Body mass index is 33.35 kg/(m^2).  GENERAL: vitals reviewed and listed above, alert, oriented, appears  well hydrated and in no acute distress  HEENT: atraumatic, conjunttiva clear, no obvious abnormalities on inspection of external nose and ears  NECK: no obvious masses on inspection  MS: moves all extremities without noticeable abnormality -TTP L malleolar TTP, difuse swelling and soft tissue mild TTP in L ankle, NV intact distal, neg talar tilt and ant/post drawer  PSYCH: pleasant and cooperative, no obvious depression or anxiety  ASSESSMENT AND PLAN:  Discussed the following assessment and plan:  Pain in joint, ankle and foot, left - Plan: DG Ankle Complete Left, DG Foot Complete Left  -Patient advised to return or notify a doctor immediately if symptoms worsen or persist or new concerns arise.  Patient Instructions  -go get xray  -elevate leg 30 minutes daily  -follow up with your doctor in 1-2 weeks     KIM, HANNAH R.

## 2014-04-07 NOTE — Telephone Encounter (Signed)
Noted  

## 2014-05-04 ENCOUNTER — Other Ambulatory Visit: Payer: Self-pay | Admitting: Family Medicine

## 2014-05-06 DIAGNOSIS — M20019 Mallet finger of unspecified finger(s): Secondary | ICD-10-CM | POA: Diagnosis not present

## 2014-05-31 ENCOUNTER — Other Ambulatory Visit: Payer: Self-pay | Admitting: Family Medicine

## 2014-05-31 NOTE — Telephone Encounter (Signed)
Can we refill this? 

## 2014-06-08 DIAGNOSIS — M20019 Mallet finger of unspecified finger(s): Secondary | ICD-10-CM | POA: Diagnosis not present

## 2014-06-17 ENCOUNTER — Ambulatory Visit (INDEPENDENT_AMBULATORY_CARE_PROVIDER_SITE_OTHER): Payer: Medicare Other | Admitting: Family Medicine

## 2014-06-17 ENCOUNTER — Ambulatory Visit: Payer: Medicare Other | Admitting: Family Medicine

## 2014-06-17 ENCOUNTER — Encounter: Payer: Self-pay | Admitting: Family Medicine

## 2014-06-17 ENCOUNTER — Ambulatory Visit: Payer: Medicare Other | Admitting: Internal Medicine

## 2014-06-17 VITALS — BP 159/81 | HR 62 | Temp 97.9°F | Ht 60.0 in | Wt 169.0 lb

## 2014-06-17 DIAGNOSIS — H6500 Acute serous otitis media, unspecified ear: Secondary | ICD-10-CM

## 2014-06-17 DIAGNOSIS — H6122 Impacted cerumen, left ear: Secondary | ICD-10-CM

## 2014-06-17 DIAGNOSIS — H6120 Impacted cerumen, unspecified ear: Secondary | ICD-10-CM | POA: Diagnosis not present

## 2014-06-17 DIAGNOSIS — H6502 Acute serous otitis media, left ear: Secondary | ICD-10-CM

## 2014-06-17 MED ORDER — AMOXICILLIN 875 MG PO TABS: 875.0000 mg | ORAL_TABLET | Freq: Two times a day (BID) | ORAL | Status: DC

## 2014-06-17 MED ORDER — NEOMYCIN-POLYMYXIN-HC 3.5-10000-1 OT SOLN: 4.0000 [drp] | Freq: Four times a day (QID) | OTIC | Status: DC

## 2014-06-17 NOTE — Progress Notes (Signed)
   Subjective:    Patient ID: Anne [name], female    DOB: 07-31-1946, 68 y.o.   MRN: [id number]  HPI Here for 2 days of pain in the left ear and a stuffy head. No ST or fever or cough. She put Debrox drops in the ear with no results.    Review of Systems  Constitutional: Negative.   HENT: Positive for congestion and sinus pressure. Negative for postnasal drip.   Eyes: Negative.   Respiratory: Negative.        Objective:   Physical Exam  Constitutional: She appears well-developed and well-nourished.  HENT:  Right Ear: External ear normal.  Nose: Nose normal.  Mouth/Throat: Oropharynx is clear and moist.  Left ear canal is full of cerumen   Eyes: Conjunctivae are normal.  Neck: Neck supple. No thyromegaly present.  Pulmonary/Chest: Effort normal and breath sounds normal.  Lymphadenopathy:    She has no cervical adenopathy.          Assessment & Plan:  The cerumen was removed manually with a speculum. Upon re-exam the TM is red and bulging. We will treat with Cortisporin Otic drops and Amoxicillin.

## 2014-06-17 NOTE — Progress Notes (Signed)
Pre visit review using our clinic review tool, if applicable. No additional management support is needed unless otherwise documented below in the visit note. 

## 2014-06-18 ENCOUNTER — Other Ambulatory Visit: Payer: Self-pay | Admitting: Family Medicine

## 2014-06-21 NOTE — Telephone Encounter (Signed)
Refill request from pharmacy /

## 2014-06-22 ENCOUNTER — Telehealth: Payer: Self-pay | Admitting: Family Medicine

## 2014-06-22 ENCOUNTER — Other Ambulatory Visit: Payer: Self-pay | Admitting: Family Medicine

## 2014-06-22 NOTE — Telephone Encounter (Signed)
I did not see Amlodipine on current medication list, can I send this in?

## 2014-06-22 NOTE — Telephone Encounter (Signed)
She is supposed to be taking both Bystolic 20 mg daily and Amlodipine 10 mg daily. If she needs refills of either, please refill for one year

## 2014-06-22 NOTE — Telephone Encounter (Signed)
Caller: Trystyn/Patient; Phone: [phone]; Reason for Call: Patient is calling.  Patient states she is confused about what medications she is supposed to be taking.  Patient states she has been taking Amlodipine for HTN.  Patient states she is at the Pharmacy and the Pharmacist told her she was no longer prescribed Amlodipine.  Patient states she is confused about her medications.  RN reviewed [facility].  RN reviewed that current daily medication orders include Aspirin 81mg .  Daily.  ; Hydrochlorothiazide 25mg .  ; 1 tablet by mouth daily; Calcium with Vitamin D one daily; Klor Con 76meq.  Daily; Bystolic 10mg .  ; 2 tablets daily.   Above medications reviewed with patient.   Patient is requesting verification, per Dr.  Sarajane Jews, if she is to continue Amlodipine.  Patient states she has been taking both Amlodipine and Bystolic for an extended period of time.   Patient is requesting a return call to verify her medications.  Patient can be reached at [phone] (cell) or [phone] (home).

## 2014-06-23 ENCOUNTER — Telehealth: Payer: Self-pay | Admitting: Family Medicine

## 2014-06-23 NOTE — Telephone Encounter (Signed)
I left a voice message for pt to let her know that the script for Amlodipine was sent e-scribe to pharmacy.

## 2014-06-23 NOTE — Telephone Encounter (Signed)
Caller: Kaylina/Patient; Phone: [phone]; Reason for Call: Patient is confused about her blood pressure medication.  She received a call from the office and did not understand the message.  Which blood pressure medication should she be taking?  Reviewed EPIC and advised caller that she should be taking her Amlodipine and Bystolic.  She wants to know should she take the HCTZ as well?  She is not clear about the HCTZ.  Please review and call patient back.

## 2014-06-24 NOTE — Telephone Encounter (Signed)
I spoke with pt and went over this information.

## 2014-06-24 NOTE — Telephone Encounter (Signed)
Yes she should take the HCTZ as well (all three meds)

## 2014-06-29 DIAGNOSIS — H60399 Other infective otitis externa, unspecified ear: Secondary | ICD-10-CM | POA: Diagnosis not present

## 2014-07-01 DIAGNOSIS — M20019 Mallet finger of unspecified finger(s): Secondary | ICD-10-CM | POA: Diagnosis not present

## 2014-07-11 ENCOUNTER — Encounter (HOSPITAL_COMMUNITY): Payer: Self-pay | Admitting: Emergency Medicine

## 2014-07-11 ENCOUNTER — Emergency Department (HOSPITAL_COMMUNITY)
Admission: EM | Admit: 2014-07-11 | Discharge: 2014-07-11 | Disposition: A | Discharge status: Home / Self Care | Payer: Medicare Other | Attending: Emergency Medicine | Admitting: Emergency Medicine

## 2014-07-11 DIAGNOSIS — Z8673 Personal history of transient ischemic attack (TIA), and cerebral infarction without residual deficits: Secondary | ICD-10-CM | POA: Insufficient documentation

## 2014-07-11 DIAGNOSIS — Z8639 Personal history of other endocrine, nutritional and metabolic disease: Secondary | ICD-10-CM | POA: Insufficient documentation

## 2014-07-11 DIAGNOSIS — R5381 Other malaise: Secondary | ICD-10-CM | POA: Insufficient documentation

## 2014-07-11 DIAGNOSIS — R509 Fever, unspecified: Secondary | ICD-10-CM | POA: Diagnosis not present

## 2014-07-11 DIAGNOSIS — M899 Disorder of bone, unspecified: Secondary | ICD-10-CM | POA: Insufficient documentation

## 2014-07-11 DIAGNOSIS — T43605A Adverse effect of unspecified psychostimulants, initial encounter: Secondary | ICD-10-CM | POA: Diagnosis not present

## 2014-07-11 DIAGNOSIS — R42 Dizziness and giddiness: Secondary | ICD-10-CM | POA: Diagnosis not present

## 2014-07-11 DIAGNOSIS — Z79899 Other long term (current) drug therapy: Secondary | ICD-10-CM | POA: Diagnosis not present

## 2014-07-11 DIAGNOSIS — T40601A Poisoning by unspecified narcotics, accidental (unintentional), initial encounter: Secondary | ICD-10-CM | POA: Diagnosis not present

## 2014-07-11 DIAGNOSIS — T40605A Adverse effect of unspecified narcotics, initial encounter: Secondary | ICD-10-CM

## 2014-07-11 DIAGNOSIS — M949 Disorder of cartilage, unspecified: Secondary | ICD-10-CM

## 2014-07-11 DIAGNOSIS — H9209 Otalgia, unspecified ear: Secondary | ICD-10-CM | POA: Diagnosis not present

## 2014-07-11 DIAGNOSIS — J069 Acute upper respiratory infection, unspecified: Secondary | ICD-10-CM | POA: Insufficient documentation

## 2014-07-11 DIAGNOSIS — Z862 Personal history of diseases of the blood and blood-forming organs and certain disorders involving the immune mechanism: Secondary | ICD-10-CM | POA: Insufficient documentation

## 2014-07-11 DIAGNOSIS — Z7982 Long term (current) use of aspirin: Secondary | ICD-10-CM | POA: Insufficient documentation

## 2014-07-11 DIAGNOSIS — R5383 Other fatigue: Secondary | ICD-10-CM | POA: Insufficient documentation

## 2014-07-11 DIAGNOSIS — I10 Essential (primary) hypertension: Secondary | ICD-10-CM | POA: Diagnosis not present

## 2014-07-11 DIAGNOSIS — R110 Nausea: Secondary | ICD-10-CM | POA: Insufficient documentation

## 2014-07-11 LAB — URINALYSIS, ROUTINE W REFLEX MICROSCOPIC
Bilirubin Urine: NEGATIVE
Glucose, UA: NEGATIVE mg/dL
HGB URINE DIPSTICK: NEGATIVE
Ketones, ur: NEGATIVE mg/dL
Leukocytes, UA: NEGATIVE
Nitrite: NEGATIVE
PH: 6.5 (ref 5.0–8.0)
Protein, ur: NEGATIVE mg/dL
Specific Gravity, Urine: 1.023 (ref 1.005–1.030)
UROBILINOGEN UA: 0.2 mg/dL (ref 0.0–1.0)

## 2014-07-11 LAB — I-STAT CHEM 8, ED
BUN: 16 mg/dL (ref 6–23)
Calcium, Ion: 1.13 mmol/L (ref 1.13–1.30)
Chloride: 100 mEq/L (ref 96–112)
Creatinine, Ser: 0.7 mg/dL (ref 0.50–1.10)
Glucose, Bld: 129 mg/dL — ABNORMAL HIGH (ref 70–99)
HCT: 47 % — ABNORMAL HIGH (ref 36.0–46.0)
HEMOGLOBIN: 16 g/dL — AB (ref 12.0–15.0)
POTASSIUM: 3.9 meq/L (ref 3.7–5.3)
Sodium: 138 mEq/L (ref 137–147)
TCO2: 28 mmol/L (ref 0–100)

## 2014-07-11 LAB — RAPID STREP SCREEN (MED CTR MEBANE ONLY): Streptococcus, Group A Screen (Direct): NEGATIVE

## 2014-07-11 NOTE — ED Provider Notes (Signed)
68 year old female who took a 58-1/2-year-old prescription of cough medication containing opiates and subsequently developed nausea and dizziness. This has eased off and this morning her symptoms are much improved compared to what they were last night. She denies chest pain shortness of breath fevers chills diarrhea swelling or any other complaints.  On exam the patient has clear heart and lung sounds, moist mucous membranes, no obvious erythema or exudate or asymmetry of the posterior pharynx, soft nontender abdomen. Labs are unremarkable, EKG unremarkable and unchanged, likely related to medications, cautioned against using expired medications and opiate medications may be the source of her symptoms. Otherwise the patient has a benign appearance.  Medical screening examination/treatment/procedure(s) were conducted as a shared visit with non-physician practitioner(s) and myself.  I personally evaluated the patient during the encounter.   EKG Interpretation  Date/Time:  Monday July 11 2014 12:44:08 EDT Ventricular Rate:  66 PR Interval:  202 QRS Duration: 105 QT Interval:  431 QTC Calculation: 452 R Axis:   -51 Text Interpretation:  Sinus rhythm Left anterior fascicular block Abnormal R-wave progression, late transition Left ventricular hypertrophy Baseline wander in lead(s) V2 V6 since last tracing no significant change Confirmed by Sabra Heck  MD, Rasheka Denard ([id number]) on 07/11/2014 12:50:59 PM      Clinical Impression:   Final diagnoses:  Adverse effect of narcotic, initial encounter         Johnna Acosta, MD 07/11/14 [phone]

## 2014-07-11 NOTE — ED Notes (Signed)
Per pt sts she woke up this am ans had a scratchy throat. sts she took some cough medication and since has been nauseous but no vomited. sts now she feels weak. Denies any pain

## 2014-07-11 NOTE — ED Provider Notes (Signed)
CSN: [id number]     Arrival date & time 07/11/14  1003 History   First MD Initiated Contact with Patient 07/11/14 1135     Chief Complaint  Patient presents with  . Nausea  . Weakness     (Consider location/radiation/quality/duration/timing/severity/associated sxs/prior Treatment) HPI Comments: Patient is a 68 year old female past no history of hypertension, CVA, presents emergency room chief complaint of generalized weakness and vomiting since this morning. The patient reports sore throat for 3 days, cough and congestion for 2 days. The patient denies fever. The patient reports taking narcotic cough medication this morning, prescribed 2-3 years ago, on an empty stomach. The patient reports after she felt lightheaded, generalized fatigue, stating "just wants to go lay down and sleep".  Pt denies focal weakness. The patient reports associated nausea with dry heaving, and vomiting up water. The patient denies chest pain, shortness breath, headache. Pt reports ongoing left ear discomfort, currently under care of ENT. Reports subjective fever last night.  Patient is a 68 y.o. female presenting with weakness. The history is provided by the patient. No language interpreter was used.  Weakness Associated symptoms include fatigue, a fever and weakness. Pertinent negatives include no abdominal pain, chest pain or chills.    Past Medical History  Diagnosis Date  . Hyperlipidemia   . Hypertension   . Stroke 09-2007    has mild left leg weakness, saw Dr. Brett Fairy  . Osteopenia     per DEXA on 07-19-09   History reviewed. No pertinent past surgical history. History reviewed. No pertinent family history. History  Substance Use Topics  . Smoking status: Never Smoker   . Smokeless tobacco: Never Used  . Alcohol Use: Yes     Comment: rare   OB History   Grav Para Term Preterm Abortions TAB SAB Ect Mult Living                 Review of Systems  Constitutional: Positive for fever and fatigue.  Negative for chills.  HENT: Positive for ear pain.   Respiratory: Negative for shortness of breath.   Cardiovascular: Negative for chest pain.  Gastrointestinal: Negative for abdominal pain.  Neurological: Positive for weakness and light-headedness.      Allergies  Iodine  Home Medications   Prior to Admission medications   Medication Sig Start Date End Date Taking? Authorizing Provider  amLODipine (NORVASC) 10 MG tablet Take 10 mg by mouth daily.   Yes Historical Provider, MD  aspirin 81 MG tablet Take 81 mg by mouth daily.   Yes Historical Provider, MD  Calcium-Vitamin D (CALTRATE 600 PLUS-VIT D PO) Take 1 each by mouth daily.     Yes Historical Provider, MD  hydrochlorothiazide (HYDRODIURIL) 25 MG tablet Take 25 mg by mouth daily.   Yes Historical Provider, MD  nebivolol (BYSTOLIC) 10 MG tablet Take 2 tablets (20 mg total) by mouth daily. 09/30/13  Yes Laurey Morale, MD  neomycin-polymyxin-hydrocortisone (CORTISPORIN) otic solution Place 4 drops into the left ear 4 (four) times daily. 06/17/14  Yes Laurey Morale, MD  potassium chloride SA (K-DUR,KLOR-CON) 20 MEQ tablet Take 20 mEq by mouth daily.   Yes Historical Provider, MD   BP 129/68  Pulse 55  Temp(Src) 97.8 F (36.6 C)  Resp 18  SpO2 98% Physical Exam  Nursing note and vitals reviewed. Constitutional: She is oriented to person, place, and time. She appears well-developed and well-nourished. No distress.  HENT:  Head: Normocephalic and atraumatic.  Right Ear: External ear  normal. Tympanic membrane is scarred.  Left Ear: External ear normal. Tympanic membrane is scarred.  Nose: Rhinorrhea present. Right sinus exhibits no maxillary sinus tenderness and no frontal sinus tenderness. Left sinus exhibits no maxillary sinus tenderness and no frontal sinus tenderness.  Mouth/Throat: Uvula is midline and mucous membranes are normal. No trismus in the jaw. No oropharyngeal exudate, posterior oropharyngeal edema or posterior  oropharyngeal erythema.  Eyes: EOM are normal. Pupils are equal, round, and reactive to light.  Neck: Neck supple.  Cardiovascular: Normal rate and regular rhythm.   Not bradycardic on exam.  Pulmonary/Chest: Effort normal and breath sounds normal. She has no wheezes. She has no rales.  Abdominal: Bowel sounds are normal.  Musculoskeletal: Normal range of motion.  Neurological: She is alert and oriented to person, place, and time. She is not disoriented. No cranial nerve deficit or sensory deficit. She exhibits normal muscle tone. GCS eye subscore is 4. GCS verbal subscore is 5. GCS motor subscore is 6.  Speech is clear and goal oriented, follows commands II-Visual fields were normal.   III/IV/VI-Pupils were equal and reacted. Extraocular movements were full and conjugate.  V/VII-no facial droop.   VIII-normal.   Motor: Strength 5/5 to upper and lower extremities bilaterally. Moves all 4 extremities equally. Sensory: normal sensation to upper and lower extremities.  Cerebellar: Normal finger to nose bilaterally No pronator drift.  Skin: Skin is warm and dry.  Psychiatric: She has a normal mood and affect. Her behavior is normal.    ED Course  Procedures (including critical care time) Labs Review Labs Reviewed  I-STAT CHEM 8, ED - Abnormal; Notable for the following:    Glucose, Bld 129 (*)    Hemoglobin 16.0 (*)    HCT 47.0 (*)    All other components within normal limits  RAPID STREP SCREEN  CULTURE, GROUP A STREP  URINALYSIS, ROUTINE W REFLEX MICROSCOPIC    Imaging Review No results found.   EKG Interpretation   Date/Time:  Monday July 11 2014 12:44:08 EDT Ventricular Rate:  66 PR Interval:  202 QRS Duration: 105 QT Interval:  431 QTC Calculation: 452 R Axis:   -51 Text Interpretation:  Sinus rhythm Left anterior fascicular block Abnormal  R-wave progression, late transition Left ventricular hypertrophy Baseline  wander in lead(s) V2 V6 since last tracing no  significant change Confirmed  by Sabra Heck  MD, BRIAN ([id number]) on 07/11/2014 12:50:59 PM      MDM   Final diagnoses:  Adverse effect of narcotic, initial encounter  Upper respiratory infection   Patient presents with nausea, generalized weakness, tiredness likely secondary to narcotic cough medication. No neurologic deficits on exam. Afebrile, upper respiratory symptoms. Dr. Sabra Heck also evaluated patient in this encounter, advises EKG, urine, potassium given history of potassium supplement. Degrees likely secondary to narcotic medication. UA unremarkable, and a normal potassium, electrolytes. Negative rapid strep. Discharge home with precautions of narcotic medication, or salt water gargles. Reevaluation patient resting complain room, the patient is able to tolerate fluids at this time. Discussed lab results, and treatment plan with the patient. Return precautions given. Reports understanding and no other concerns at this time.  Patient is stable for discharge at this time.    Harvie Heck, PA-C 07/11/14 [phone]

## 2014-07-11 NOTE — ED Provider Notes (Signed)
Medical screening examination/treatment/procedure(s) were conducted as a shared visit with non-physician practitioner(s) and myself.  I personally evaluated the patient during the encounter  Please see my separate respective documentation pertaining to this patient encounter   Johnna Acosta, MD 07/11/14 [phone]

## 2014-07-11 NOTE — Discharge Instructions (Signed)
Call for a follow up appointment with a Family or Primary Care Provider.  Avoid taking narcotic pain medication or cough medication in the future. Return if Symptoms worsen.   Take medication as prescribed.

## 2014-07-13 ENCOUNTER — Encounter: Payer: Self-pay | Admitting: Family Medicine

## 2014-07-13 ENCOUNTER — Ambulatory Visit (INDEPENDENT_AMBULATORY_CARE_PROVIDER_SITE_OTHER): Payer: Medicare Other | Admitting: Family Medicine

## 2014-07-13 ENCOUNTER — Telehealth: Payer: Self-pay | Admitting: Family Medicine

## 2014-07-13 VITALS — BP 143/87 | HR 67 | Temp 99.4°F | Ht 60.0 in | Wt 168.0 lb

## 2014-07-13 DIAGNOSIS — J209 Acute bronchitis, unspecified: Secondary | ICD-10-CM

## 2014-07-13 LAB — CULTURE, GROUP A STREP

## 2014-07-13 MED ORDER — AZITHROMYCIN 250 MG PO TABS
ORAL_TABLET | ORAL | Status: DC
Start: 2014-07-13 — End: 2015-11-17

## 2014-07-13 MED ORDER — HYDROCODONE-HOMATROPINE 5-1.5 MG/5ML PO SYRP: 5.0000 mL | ORAL_SOLUTION | ORAL | Status: DC | PRN

## 2014-07-13 NOTE — Progress Notes (Signed)
Pre visit review using our clinic review tool, if applicable. No additional management support is needed unless otherwise documented below in the visit note. 

## 2014-07-13 NOTE — Telephone Encounter (Signed)
Noted  

## 2014-07-13 NOTE — Telephone Encounter (Signed)
Patient Information:  Caller Name: Khylah  Phone: [phone]  Patient: Anne [name]  Gender: Female  DOB: 27-Jan-1946  Age: 68 Years  PCP: Alysia Penna [facility])  Office Follow Up:  Does the office need to follow up with this patient?: No  Instructions For The Office: N/A  RN Note:  Patient calling regarding ear pain, congestion, and sneezing.  Recently finished a round of antibiotics for ear infection, and states minimal relief.  Unable to sleep do to cough can sneezing.  Symptoms  Reason For Call & Symptoms: cold, ear pain  Reviewed Health History In EMR: Yes  Reviewed Medications In EMR: Yes  Reviewed Allergies In EMR: Yes  Reviewed Surgeries / Procedures: Yes  Date of Onset of Symptoms: 06/29/2014  Guideline(s) Used:  Cough  Disposition Per Guideline:   See Today or Tomorrow in Office  Reason For Disposition Reached:   Continuous (nonstop) coughing interferes with work or school and no improvement using cough treatment per Care Advice  Advice Given:  Coughing Spasms:  Drink warm fluids. Inhale warm mist (Reason: both relax the airway and loosen up the phlegm).  Suck on cough drops or hard candy to coat the irritated throat.  Prevent Dehydration:  Drink adequate liquids.  This will help soothe an irritated or dry throat and loosen up the phlegm.  Call Back If:  You become worse.  Patient Will Follow Care Advice:  YES  Appointment Scheduled:  07/13/2014 16:15:00 Appointment Scheduled Provider:  Alysia Penna [facility])

## 2014-07-13 NOTE — Progress Notes (Signed)
   Subjective:    Patient ID: Anne [name], female    DOB: Oct 20, 1945, 68 y.o.   MRN: [id number]  HPI Here for 4 days of PND, chest tightness and coughing up green sputum. No fever. She was seen for an otitis media on 06-17-14 and was given Amoxicillin, and this seemed to work well.   Review of Systems  Constitutional: Negative.   HENT: Negative.   Eyes: Negative.   Respiratory: Positive for cough, chest tightness and shortness of breath.   Cardiovascular: Negative.        Objective:   Physical Exam  Constitutional: She appears well-developed and well-nourished.  HENT:  Right Ear: External ear normal.  Left Ear: External ear normal.  Nose: Nose normal.  Mouth/Throat: Oropharynx is clear and moist.  Eyes: Conjunctivae are normal.  Pulmonary/Chest: Effort normal and breath sounds normal.  Lymphadenopathy:    She has no cervical adenopathy.          Assessment & Plan:  Add Mucinex prn

## 2014-07-22 DIAGNOSIS — S66394D Other injury of extensor muscle, fascia and tendon of right ring finger at wrist and hand level, subsequent encounter: Secondary | ICD-10-CM | POA: Diagnosis not present

## 2014-10-12 ENCOUNTER — Telehealth: Payer: Self-pay | Admitting: Family Medicine

## 2014-10-12 ENCOUNTER — Other Ambulatory Visit: Payer: Self-pay | Admitting: Family Medicine

## 2014-10-12 NOTE — Telephone Encounter (Signed)
[id number] [place], [place] - [place] [phone] is requesting 90 day re-fill on nebivolol (BYSTOLIC) 10 MG tablet

## 2014-10-13 MED ORDER — NEBIVOLOL HCL 10 MG PO TABS: 20.0000 mg | ORAL_TABLET | Freq: Every day | ORAL | Status: DC

## 2014-10-13 NOTE — Telephone Encounter (Signed)
done

## 2014-10-13 NOTE — Telephone Encounter (Signed)
Can we refill this & is this the correct dosage?

## 2014-11-08 ENCOUNTER — Telehealth: Payer: Self-pay | Admitting: Family Medicine

## 2014-11-08 NOTE — Telephone Encounter (Signed)
Patient Name: Anne [name] DOB: Jul 11, 1946 Initial Comment Caller States she took some ibuprofen for a tooth ache and it raised her blood pressure 174/84 was her last reading. no symptoms Nurse Assessment Nurse: Vallery Sa, RN, [place] Date/Time (Eastern Time): 11/08/2014 1:53:53 PM Confirm and document reason for call. If symptomatic, describe symptoms. ---Caller states her blood pressure was 174/84 today. She thinks it is elevated because she took Motrin. Has the patient traveled out of the country within the last 30 days? ---No Does the patient require triage? ---Yes Related visit to physician within the last 2 weeks? ---No Does the PT have any chronic conditions? (i.e. diabetes, asthma, etc.) ---Yes List chronic conditions. ---High Blood Pressure Guidelines Guideline Title Affirmed Question Affirmed Notes High Blood Pressure BP # 180/110 Final Disposition User See Physician within [place], Therapist, sports, [employer]

## 2014-11-08 NOTE — Telephone Encounter (Signed)
Pt is on schedule for 11/09/14 to see Dr. Sarajane Jews.

## 2014-11-09 ENCOUNTER — Encounter: Payer: Self-pay | Admitting: Family Medicine

## 2014-11-09 ENCOUNTER — Ambulatory Visit (INDEPENDENT_AMBULATORY_CARE_PROVIDER_SITE_OTHER): Payer: Medicare Other | Admitting: Family Medicine

## 2014-11-09 VITALS — BP 152/84 | HR 61 | Temp 97.9°F | Ht 60.0 in | Wt 164.0 lb

## 2014-11-09 DIAGNOSIS — K219 Gastro-esophageal reflux disease without esophagitis: Secondary | ICD-10-CM

## 2014-11-09 DIAGNOSIS — I10 Essential (primary) hypertension: Secondary | ICD-10-CM

## 2014-11-09 NOTE — Progress Notes (Signed)
   Subjective:    Patient ID: Anne [name], female    DOB: 1946-05-21, 69 y.o.   MRN: [id number]  HPI Here with concerns about her BP. Her systolic readings have been as high as 180 in the past few days. No HA or chest pain or SOB. She has had some heartburn lately however which is very unusual for her. She has had some dental pain and has been taking Ibuprofen for the past week or so. She also mentions feeling like food or liquids have a hard time going down when she swallows them.    Review of Systems  Constitutional: Negative.   Respiratory: Negative.   Cardiovascular: Negative.   Gastrointestinal: Negative.   Neurological: Negative.        Objective:   Physical Exam  Constitutional: She appears well-developed and well-nourished.  Neck: Neck supple. No thyromegaly present.  Cardiovascular: Normal rate, regular rhythm, normal heart sounds and intact distal pulses.   Pulmonary/Chest: Effort normal and breath sounds normal.  Lymphadenopathy:    She has no cervical adenopathy.          Assessment & Plan:  It is likely that her BP has been high recently as an effect of her GERD, and this has most likely flared up because of the Ibuprofen she been taking. I suggested she use Tylenol instead for pain. She will see her dentist later today. Hopefully her BP will settle down soon, but if not she will return

## 2014-11-09 NOTE — Progress Notes (Signed)
Pre visit review using our clinic review tool, if applicable. No additional management support is needed unless otherwise documented below in the visit note. 

## 2014-11-10 ENCOUNTER — Telehealth: Payer: Self-pay | Admitting: Family Medicine

## 2014-11-10 NOTE — Telephone Encounter (Signed)
emmi mailed  °

## 2014-11-11 ENCOUNTER — Emergency Department (HOSPITAL_COMMUNITY)
Admission: EM | Admit: 2014-11-11 | Discharge: 2014-11-11 | Disposition: A | Discharge status: Home / Self Care | Payer: Medicare Other | Attending: Emergency Medicine | Admitting: Emergency Medicine

## 2014-11-11 ENCOUNTER — Encounter (HOSPITAL_COMMUNITY): Payer: Self-pay | Admitting: Emergency Medicine

## 2014-11-11 ENCOUNTER — Emergency Department (HOSPITAL_COMMUNITY): Payer: Medicare Other

## 2014-11-11 DIAGNOSIS — Z8739 Personal history of other diseases of the musculoskeletal system and connective tissue: Secondary | ICD-10-CM | POA: Diagnosis not present

## 2014-11-11 DIAGNOSIS — Z8673 Personal history of transient ischemic attack (TIA), and cerebral infarction without residual deficits: Secondary | ICD-10-CM | POA: Insufficient documentation

## 2014-11-11 DIAGNOSIS — I10 Essential (primary) hypertension: Secondary | ICD-10-CM | POA: Insufficient documentation

## 2014-11-11 DIAGNOSIS — R112 Nausea with vomiting, unspecified: Secondary | ICD-10-CM | POA: Insufficient documentation

## 2014-11-11 DIAGNOSIS — Z7982 Long term (current) use of aspirin: Secondary | ICD-10-CM | POA: Diagnosis not present

## 2014-11-11 DIAGNOSIS — Z79899 Other long term (current) drug therapy: Secondary | ICD-10-CM | POA: Insufficient documentation

## 2014-11-11 DIAGNOSIS — Z8639 Personal history of other endocrine, nutritional and metabolic disease: Secondary | ICD-10-CM | POA: Insufficient documentation

## 2014-11-11 DIAGNOSIS — R42 Dizziness and giddiness: Secondary | ICD-10-CM

## 2014-11-11 DIAGNOSIS — R197 Diarrhea, unspecified: Secondary | ICD-10-CM | POA: Diagnosis not present

## 2014-11-11 DIAGNOSIS — R5383 Other fatigue: Secondary | ICD-10-CM | POA: Diagnosis not present

## 2014-11-11 LAB — I-STAT TROPONIN, ED: Troponin i, poc: 0 ng/mL (ref 0.00–0.08)

## 2014-11-11 LAB — ETHANOL: Alcohol, Ethyl (B): <5 mg/dL (ref 0–9)

## 2014-11-11 LAB — RAPID URINE DRUG SCREEN, HOSP PERFORMED
Amphetamines: NOT DETECTED
BENZODIAZEPINES: NOT DETECTED
Barbiturates: NOT DETECTED
Cocaine: NOT DETECTED
Opiates: POSITIVE — AB
Tetrahydrocannabinol: NOT DETECTED

## 2014-11-11 LAB — URINALYSIS, ROUTINE W REFLEX MICROSCOPIC
Bilirubin Urine: NEGATIVE
GLUCOSE, UA: NEGATIVE mg/dL
Hgb urine dipstick: NEGATIVE
KETONES UR: 15 mg/dL — AB
Leukocytes, UA: NEGATIVE
NITRITE: NEGATIVE
Protein, ur: NEGATIVE mg/dL
Specific Gravity, Urine: 1.014 (ref 1.005–1.030)
Urobilinogen, UA: 1 mg/dL (ref 0.0–1.0)
pH: 8 (ref 5.0–8.0)

## 2014-11-11 LAB — CBC
HEMATOCRIT: 40.8 % (ref 36.0–46.0)
Hemoglobin: 14 g/dL (ref 12.0–15.0)
MCH: 30.1 pg (ref 26.0–34.0)
MCHC: 34.3 g/dL (ref 30.0–36.0)
MCV: 87.7 fL (ref 78.0–100.0)
Platelets: 319 10*3/uL (ref 150–400)
RBC: 4.65 MIL/uL (ref 3.87–5.11)
RDW: 12.7 % (ref 11.5–15.5)
WBC: 11.5 10*3/uL — AB (ref 4.0–10.5)

## 2014-11-11 LAB — COMPREHENSIVE METABOLIC PANEL
ALK PHOS: 77 U/L (ref 39–117)
ALT: 13 U/L (ref 0–35)
AST: 25 U/L (ref 0–37)
Albumin: 4.2 g/dL (ref 3.5–5.2)
Anion gap: 5 (ref 5–15)
BUN: 13 mg/dL (ref 6–23)
CHLORIDE: 100 mmol/L (ref 96–112)
CO2: 31 mmol/L (ref 19–32)
CREATININE: 0.68 mg/dL (ref 0.50–1.10)
Calcium: 9.3 mg/dL (ref 8.4–10.5)
GFR calc non Af Amer: 89 mL/min — ABNORMAL LOW (ref >90)
Glucose, Bld: 149 mg/dL — ABNORMAL HIGH (ref 70–99)
Potassium: 3.1 mmol/L — ABNORMAL LOW (ref 3.5–5.1)
Sodium: 136 mmol/L (ref 135–145)
TOTAL PROTEIN: 7.4 g/dL (ref 6.0–8.3)
Total Bilirubin: 0.7 mg/dL (ref 0.3–1.2)

## 2014-11-11 LAB — DIFFERENTIAL
BASOS ABS: 0 10*3/uL (ref 0.0–0.1)
BASOS PCT: 0 % (ref 0–1)
EOS PCT: 0 % (ref 0–5)
Eosinophils Absolute: 0 10*3/uL (ref 0.0–0.7)
Lymphocytes Relative: 10 % — ABNORMAL LOW (ref 12–46)
Lymphs Abs: 1.2 10*3/uL (ref 0.7–4.0)
Monocytes Absolute: 0.4 10*3/uL (ref 0.1–1.0)
Monocytes Relative: 4 % (ref 3–12)
Neutro Abs: 9.9 10*3/uL — ABNORMAL HIGH (ref 1.7–7.7)
Neutrophils Relative %: 86 % — ABNORMAL HIGH (ref 43–77)

## 2014-11-11 LAB — I-STAT CHEM 8, ED
BUN: 15 mg/dL (ref 6–23)
Calcium, Ion: 1.1 mmol/L — ABNORMAL LOW (ref 1.13–1.30)
Chloride: 98 mmol/L (ref 96–112)
Creatinine, Ser: 0.6 mg/dL (ref 0.50–1.10)
Glucose, Bld: 147 mg/dL — ABNORMAL HIGH (ref 70–99)
HCT: 46 % (ref 36.0–46.0)
Hemoglobin: 15.6 g/dL — ABNORMAL HIGH (ref 12.0–15.0)
Potassium: 3.1 mmol/L — ABNORMAL LOW (ref 3.5–5.1)
Sodium: 139 mmol/L (ref 135–145)
TCO2: 25 mmol/L (ref 0–100)

## 2014-11-11 LAB — PROTIME-INR
INR: 1.09 (ref 0.00–1.49)
PROTHROMBIN TIME: 14.3 s (ref 11.6–15.2)

## 2014-11-11 LAB — APTT: aPTT: 32 seconds (ref 24–37)

## 2014-11-11 MED ORDER — ONDANSETRON HCL 4 MG/2ML IJ SOLN
4.0000 mg | Freq: Once | INTRAMUSCULAR | Status: AC
  Administered 2014-11-11: 4 mg via INTRAVENOUS

## 2014-11-11 MED ORDER — MECLIZINE HCL 50 MG PO TABS: 50.0000 mg | ORAL_TABLET | Freq: Three times a day (TID) | ORAL | Status: DC | PRN

## 2014-11-11 MED ORDER — ONDANSETRON HCL 4 MG/2ML IJ SOLN
INTRAMUSCULAR | Status: AC
  Administered 2014-11-11: 4 mg
  Filled 2014-11-11: qty 2

## 2014-11-11 NOTE — Consult Note (Signed)
Consult Reason for Consult:dizziness Referring Physician: Dr Claudine Mouton [facility] ED  CC: dizziness and emesis  HPI: Anne [name] is an 69 y.o. female pmhx of question of CVA, HTN, HLD presents with 3 days of emesis with onset of dizziness starting last night. Dizziness described as a light headed sensation, worse with standing and improved with laying flat. No vertigo. No focal motor or sensory changes. No speech or visual changes. She notes nausea and emesis started after taking amoxicillin for a tooth infection. She has been unable to take her BP medication due to emesis. At baseline she reports it is normally around 150/70. She is currently asymptomatic.   Was evaluated for a stroke back in 2008 after having dizziness and left leg weakness, MRI brain negative at that time. MRA head in 2013 shows segmental narrowing of right P2. She takes a daily ASA 81mg .   Past Medical History  Diagnosis Date  . Hyperlipidemia   . Hypertension   . Stroke 09-2007    has mild left leg weakness, saw Dr. Brett Fairy  . Osteopenia     per DEXA on 07-19-09    History reviewed. No pertinent past surgical history.  No family history on file.  Social History:  reports that she has never smoked. She has never used smokeless tobacco. She reports that she drinks alcohol. She reports that she does not use illicit drugs.  Allergies  Allergen Reactions  . Iodine Other (See Comments)    unknown    Medications: I have reviewed the patient's current medications.  Head CT reviewed and overall unremarkable.   ROS: Out of a complete 14 system review, the patient complains of only the following symptoms, and all other reviewed systems are negative. +nausea Checked orthostatic vitals: Supine 158/71 P 70 Standing 159/84 P 73  Physical Examination: Filed Vitals:   11/11/14 0247  BP:   Pulse:   Temp: 97.8 F (36.6 C)  Resp:    Physical Exam  Constitutional: He appears well-developed and well-nourished.  Psych:  Affect appropriate to situation Eyes: No scleral injection HENT: No OP obstrucion Head: Normocephalic.  Cardiovascular: Normal rate and regular rhythm.  Respiratory: Effort normal and breath sounds normal.  GI: Soft. Bowel sounds are normal. No distension. There is no tenderness.  Skin: WDI  Neurologic Examination Mental Status: Alert, oriented, thought content appropriate.  Speech fluent without evidence of aphasia.  Able to follow 3 step commands without difficulty. Cranial Nerves: II: funduscopic exam wnl bilaterally, visual fields grossly normal, pupils equal, round, reactive to light and accommodation III,IV, VI: ptosis not present, extra-ocular motions intact bilaterally V,VII: smile symmetric, facial light touch sensation normal bilaterally VIII: hearing normal bilaterally IX,X: gag reflex present XI: trapezius strength/neck flexion strength normal bilaterally XII: tongue strength normal  Motor: Right : Upper extremity    Left:     Upper extremity 5/5 deltoid       5/5 deltoid 5/5 biceps      5/5 biceps  5/5 triceps      5/5 triceps 5/5 hand grip      5/5 hand grip  Lower extremity     Lower extremity 5/5 hip flexor      5/5 hip flexor 5/5 quadricep      5/5 quadriceps  5/5 hamstrings     5/5 hamstrings 5/5 plantar flexion       5/5 plantar flexion 5/5 plantar extension     5/5 plantar extension Tone and bulk:normal tone throughout; no atrophy noted Sensory: Pinprick and  light touch intact throughout, bilaterally Deep Tendon Reflexes: 2+ and symmetric throughout Plantars: Right: downgoing   Left: downgoing Cerebellar: normal finger-to-nose,  and normal heel-to-shin test Gait: able to stand without assistance, narrow based, no drift, takes few steps without difficulty  Laboratory Studies:   Basic Metabolic Panel:  Recent Labs Lab 11/11/14 0209 11/11/14 0219  NA 136 139  K 3.1* 3.1*  CL 100 98  CO2 31  --   GLUCOSE 149* 147*  BUN 13 15  CREATININE 0.68  0.60  CALCIUM 9.3  --     Liver Function Tests:  Recent Labs Lab 11/11/14 0209  AST 25  ALT 13  ALKPHOS 77  BILITOT 0.7  PROT 7.4  ALBUMIN 4.2   No results for input(s): LIPASE, AMYLASE in the last 168 hours. No results for input(s): AMMONIA in the last 168 hours.  CBC:  Recent Labs Lab 11/11/14 0209 11/11/14 0219  WBC 11.5*  --   NEUTROABS 9.9*  --   HGB 14.0 15.6*  HCT 40.8 46.0  MCV 87.7  --   PLT 319  --     Cardiac Enzymes: No results for input(s): CKTOTAL, CKMB, CKMBINDEX, TROPONINI in the last 168 hours.  BNP: Invalid input(s): POCBNP  CBG: No results for input(s): GLUCAP in the last 168 hours.  Microbiology: Results for orders placed or performed during the hospital encounter of 07/11/14  Rapid strep screen     Status: None   Collection Time: 07/11/14  1:38 PM  Result Value Ref Range Status   Streptococcus, Group A Screen (Direct) NEGATIVE NEGATIVE Final    Comment: (NOTE) A Rapid Antigen test may result negative if the antigen level in the sample is below the detection level of this test. The FDA has not cleared this test as a stand-alone test therefore the rapid antigen negative result has reflexed to a Group A Strep culture.  Culture, Group A Strep     Status: None   Collection Time: 07/11/14  1:38 PM  Result Value Ref Range Status   Specimen Description THROAT  Final   Special Requests NONE  Final   Culture   Final    No Beta Hemolytic Streptococci Isolated Performed at [facility]   Report Status 07/13/2014 FINAL  Final    Coagulation Studies:  Recent Labs  11/11/14 0209  LABPROT 14.3  INR 1.09    Urinalysis:  Recent Labs Lab 11/11/14 0230  COLORURINE YELLOW  LABSPEC 1.014  PHURINE 8.0  GLUCOSEU NEGATIVE  HGBUR NEGATIVE  BILIRUBINUR NEGATIVE  KETONESUR 15*  PROTEINUR NEGATIVE  UROBILINOGEN 1.0  NITRITE NEGATIVE  LEUKOCYTESUR NEGATIVE    Lipid Panel:     Component Value Date/Time   CHOL 209* 06/04/2013  1024   TRIG 104.0 06/04/2013 1024   HDL 38.50* 06/04/2013 1024   CHOLHDL 5 06/04/2013 1024   VLDL 20.8 06/04/2013 1024   LDLCALC 119* 04/05/2008 0948    HgbA1C:  Lab Results  Component Value Date   HGBA1C  10/03/2007    6.0 (NOTE)   The ADA recommends the following therapeutic goals for glycemic   control related to Hgb A1C measurement:   Goal of Therapy:   < 7.0% Hgb A1C   Action Suggested:  > 8.0% Hgb A1C   Ref:  Diabetes Care, 22, Suppl. 1, 1999    Urine Drug Screen:  No results found for: LABOPIA, COCAINSCRNUR, [place], [place], [facility], [place]  Alcohol Level:  Recent Labs Lab 11/11/14 [place] <5  normal EKG, normal sinus rhythm Other results: EKG: .  Imaging: Ct Head Wo Contrast  11/11/2014   CLINICAL DATA:  Nausea and vomiting today.  Dizziness.  EXAM: CT HEAD WITHOUT CONTRAST  TECHNIQUE: Contiguous axial images were obtained from the base of the skull through the vertex without intravenous contrast.  COMPARISON:  CT head 10/02/2007. MR angiography head 03/18/2012. MRI brain 10/02/2007.  FINDINGS: Diffuse cerebral atrophy. Ventricular dilatation consistent with central atrophy. Low-attenuation changes in the deep white matter consistent with small vessel ischemia. No mass effect or midline shift. No abnormal extra-axial fluid collections. Gray-white matter junctions are distinct. Basal cisterns are not effaced. No evidence of acute intracranial hemorrhage. No depressed skull fractures. Visualized paranasal sinuses and mastoid air cells are not opacified.  IMPRESSION: No acute intracranial abnormalities. Chronic atrophy and small vessel ischemic changes.   Electronically Signed   By: Lucienne Capers M.D.   On: 11/11/2014 02:57     Assessment/Plan:  69y/o woman with history of HLD, HTN presents for 3 days of nausea/emesis and recent onset of dizziness, described as a light headed sensation. Exam is non-focal and patient currently asymptomatic. Suspect dizziness is likely  related to ongoing nausea with frequent emesis. Low suspicion symptoms represent a central process. No further neurological workup indicated at this time. Continue ASA 81mg  daily. Will need PCP follow up for improved BP control. Return to ED if symptoms worsen.   Jim Like, DO [facility] [phone]  If 7pm- 7am, please page neurology on call as listed in [place]. 11/11/2014, 3:19 AM

## 2014-11-11 NOTE — Discharge Instructions (Signed)
Dizziness Anne [name], you were seen today for dizziness and vomiting. You may have vertigo, take medication as prescribed. Follow-up with your regular physician within 3 days for continued management. If your symptoms worsen or he develop weakness or numbness come back to emergency department immediately.Thank you.  Dizziness means you feel unsteady or lightheaded. You might feel like you are going to pass out (faint). HOME CARE   Drink enough fluids to keep your pee (urine) clear or pale yellow.  Take your medicines exactly as told by your doctor. If you take blood pressure medicine, always stand up slowly from the lying or sitting position. Hold on to something to steady yourself.  If you need to stand in one place for a long time, move your legs often. Tighten and relax your leg muscles.  Have someone stay with you until you feel okay.  Do not drive or use heavy machinery if you feel dizzy.  Do not drink alcohol. GET HELP RIGHT AWAY IF:   You feel dizzy or lightheaded and it gets worse.  You feel sick to your stomach (nauseous), or you throw up (vomit).  You have trouble talking or walking.  You feel weak or have trouble using your arms, hands, or legs.  You cannot think clearly or have trouble forming sentences.  You have chest pain, belly (abdominal) pain, sweating, or you are short of breath.  Your vision changes.  You are bleeding.  You have problems from your medicine that seem to be getting worse. MAKE SURE YOU:   Understand these instructions.  Will watch your condition.  Will get help right away if you are not doing well or get worse. Document Released: 09/19/2011 Document Revised: 12/23/2011 Document Reviewed: 09/19/2011 [facility] Patient Information 2015 [place], [place]. This information is not intended to replace advice given to you by your health care provider. Make sure you discuss any questions you have with your health care provider.

## 2014-11-11 NOTE — ED Notes (Signed)
Pt reports nausea and vomiting that started yesterday after visiting dentist and being prescribed amoxicillin for tooth. Pt states she has been unable to hold anything down including medications. Pt was unable to take blood pressure medicines due to nausea. Pt states she has some dizziness as well.

## 2014-11-11 NOTE — ED Provider Notes (Signed)
CSN: [id number]     Arrival date & time 11/11/14  0111 History  This chart was scribed for Everlene Balls, MD by Chester Holstein, ED Scribe. This patient was seen in room D34C/D34C and the patient's care was started at 1:50 AM.     Chief Complaint  Patient presents with  . Emesis    Patient is a 69 y.o. female presenting with vomiting. The history is provided by the patient. No language interpreter was used.  Emesis   HPI Comments: Anne [name] is a 69 y.o. female with PMHx of CVA, HTN, HLD, and osteopenia who presents to the Emergency Department complaining of emesis with onset 3 days ago.  Pt states she has been vomiting about 4 times a day. Pt notes associated fatigue and dizziness with worsening this evening. Pt notes turning her head worsens the dizziness. Pt has been taking Motrin, Amoxicillin, Tylenol, and ibuprofen for a toothache for 3 days. She states she began vomiting after she first took the medication 3 days ago. She notes her BP has been high but she has been compliant with her medication but states she threw it up yesterday. Pt states BP is around 150/70 at baseline. Pt denies weakness, difficulty speaking, difficulty eating, hematemesis, urinary changes, abdominal pain, diarrhea. She denies any neurological symptoms associated with the dizziness.  Past Medical History  Diagnosis Date  . Hyperlipidemia   . Hypertension   . Stroke 09-2007    has mild left leg weakness, saw Dr. Brett Fairy  . Osteopenia     per DEXA on 07-19-09   History reviewed. No pertinent past surgical history. No family history on file. History  Substance Use Topics  . Smoking status: Never Smoker   . Smokeless tobacco: Never Used  . Alcohol Use: 0.0 oz/week    0 Not specified per week     Comment: rare   OB History    No data available     Review of Systems  Gastrointestinal: Positive for vomiting.  A complete 10 system review of systems was obtained and all systems are negative except as noted  in the HPI and PMH.      Allergies  Iodine  Home Medications   Prior to Admission medications   Medication Sig Start Date End Date Taking? Authorizing Provider  amLODipine (NORVASC) 10 MG tablet Take 10 mg by mouth daily.    Historical Provider, MD  aspirin 81 MG tablet Take 81 mg by mouth daily.    Historical Provider, MD  azithromycin (ZITHROMAX) 250 MG tablet As directed Patient not taking: Reported on 11/09/2014 07/13/14   Laurey Morale, MD  Calcium-Vitamin D (CALTRATE 600 PLUS-VIT D PO) Take 1 each by mouth daily.      Historical Provider, MD  hydrochlorothiazide (HYDRODIURIL) 25 MG tablet Take 25 mg by mouth daily.    Historical Provider, MD  hydrochlorothiazide (HYDRODIURIL) 25 MG tablet TAKE 1 TABLET (25 MG TOTAL) BY MOUTH DAILY. Patient not taking: Reported on 11/09/2014 10/12/14   Laurey Morale, MD  HYDROcodone-homatropine (HYDROMET) 5-1.5 MG/5ML syrup Take 5 mLs by mouth every 4 (four) hours as needed. Patient not taking: Reported on 11/09/2014 07/13/14   Laurey Morale, MD  nebivolol (BYSTOLIC) 10 MG tablet Take 2 tablets (20 mg total) by mouth daily. 10/13/14   Laurey Morale, MD  neomycin-polymyxin-hydrocortisone (CORTISPORIN) otic solution Place 4 drops into the left ear 4 (four) times daily. Patient not taking: Reported on 11/09/2014 06/17/14   Laurey Morale, MD  potassium chloride SA (K-DUR,KLOR-CON) 20 MEQ tablet Take 20 mEq by mouth daily.    Historical Provider, MD   BP 177/81 mmHg  Pulse 74  Temp(Src) 97.5 F (36.4 C) (Oral)  Resp 16  SpO2 95% Physical Exam  Constitutional: She is oriented to person, place, and time. She appears well-developed and well-nourished. No distress.  HENT:  Head: Normocephalic and atraumatic.  Nose: Nose normal.  Mouth/Throat: Oropharynx is clear and moist. No oropharyngeal exudate.  Eyes: Conjunctivae and EOM are normal. Pupils are equal, round, and reactive to light. No scleral icterus.  Neck: Normal range of motion. Neck supple. No JVD  present. No tracheal deviation present. No thyromegaly present.  Cardiovascular: Normal rate, regular rhythm and normal heart sounds.  Exam reveals no gallop and no friction rub.   No murmur heard. Pulmonary/Chest: Effort normal and breath sounds normal. No respiratory distress. She has no wheezes. She exhibits no tenderness.  Abdominal: Soft. Bowel sounds are normal. She exhibits no distension and no mass. There is no tenderness. There is no rebound and no guarding.  Musculoskeletal: Normal range of motion. She exhibits no edema or tenderness.  Lymphadenopathy:    She has no cervical adenopathy.  Neurological: She is alert and oriented to person, place, and time. No cranial nerve deficit. She exhibits normal muscle tone.  Skin: Skin is warm and dry. No rash noted. No erythema. No pallor.  Nursing note and vitals reviewed.   ED Course  Procedures (including critical care time) DIAGNOSTIC STUDIES: Oxygen Saturation is 95% on room air, normal by my interpretation.    COORDINATION OF CARE: 2:00 AM Discussed treatment plan with patient at beside, the patient agrees with the plan and has no further questions at this time.   Labs Review Labs Reviewed  CBC - Abnormal; Notable for the following:    WBC 11.5 (*)    All other components within normal limits  DIFFERENTIAL - Abnormal; Notable for the following:    Neutrophils Relative % 86 (*)    Neutro Abs 9.9 (*)    Lymphocytes Relative 10 (*)    All other components within normal limits  COMPREHENSIVE METABOLIC PANEL - Abnormal; Notable for the following:    Potassium 3.1 (*)    Glucose, Bld 149 (*)    GFR calc non Af Amer 89 (*)    All other components within normal limits  URINALYSIS, ROUTINE W REFLEX MICROSCOPIC - Abnormal; Notable for the following:    APPearance CLOUDY (*)    Ketones, ur 15 (*)    All other components within normal limits  I-STAT CHEM 8, ED - Abnormal; Notable for the following:    Potassium 3.1 (*)    Glucose,  Bld 147 (*)    Calcium, Ion 1.10 (*)    Hemoglobin 15.6 (*)    All other components within normal limits  ETHANOL  PROTIME-INR  APTT  URINE RAPID DRUG SCREEN (HOSP PERFORMED)  I-STAT TROPOININ, ED  I-STAT TROPOININ, ED    Imaging Review Ct Head Wo Contrast  11/11/2014   CLINICAL DATA:  Nausea and vomiting today.  Dizziness.  EXAM: CT HEAD WITHOUT CONTRAST  TECHNIQUE: Contiguous axial images were obtained from the base of the skull through the vertex without intravenous contrast.  COMPARISON:  CT head 10/02/2007. MR angiography head 03/18/2012. MRI brain 10/02/2007.  FINDINGS: Diffuse cerebral atrophy. Ventricular dilatation consistent with central atrophy. Low-attenuation changes in the deep white matter consistent with small vessel ischemia. No mass effect or midline shift.  No abnormal extra-axial fluid collections. Gray-white matter junctions are distinct. Basal cisterns are not effaced. No evidence of acute intracranial hemorrhage. No depressed skull fractures. Visualized paranasal sinuses and mastoid air cells are not opacified.  IMPRESSION: No acute intracranial abnormalities. Chronic atrophy and small vessel ischemic changes.   Electronically Signed   By: Lucienne Capers M.D.   On: 11/11/2014 02:57     EKG Interpretation   Date/Time:  Friday November 11 2014 02:44:19 EST Ventricular Rate:  67 PR Interval:  185 QRS Duration: 107 QT Interval:  430 QTC Calculation: 758 R Axis:   -40 Text Interpretation:  Sinus rhythm Atrial premature complex LVH with  secondary repolarization abnormality No significant change since last  tracing Confirmed by Glynn Octave [phone]) on 11/11/2014 2:59:48 AM      MDM   Final diagnoses:  None   Patient since emergency department for emesis and dizziness. Patient's dizziness is made worse with turning her head, she denies any other neurological symptoms. I have a low concern for possible posterior circulation insufficiency.  Neurology was  consult is for evaluation, they agree with the plan that this is likely vertigo  And not CVA. Patient will be given prescription for meclizine to take at home as needed and urge to have close primary care follow-up. Return precautions given. Her vital signs remain within her normal limits and she is safe for discharge.  I personally performed the services described in this documentation, which was scribed in my presence. The recorded information has been reviewed and is accurate.     Everlene Balls, MD 11/11/14 [phone]

## 2014-11-11 NOTE — ED Notes (Signed)
Pt. reports persistent nausea and vomitting onset yesterday , denies diarrhea , no fever or chills.

## 2015-02-07 ENCOUNTER — Emergency Department (HOSPITAL_COMMUNITY): Payer: Medicare Other

## 2015-02-07 ENCOUNTER — Emergency Department (HOSPITAL_COMMUNITY)
Admission: EM | Admit: 2015-02-07 | Discharge: 2015-02-07 | Disposition: A | Discharge status: Home / Self Care | Payer: Medicare Other | Attending: Emergency Medicine | Admitting: Emergency Medicine

## 2015-02-07 ENCOUNTER — Encounter (HOSPITAL_COMMUNITY): Payer: Self-pay | Admitting: *Deleted

## 2015-02-07 DIAGNOSIS — Y92410 Unspecified street and highway as the place of occurrence of the external cause: Secondary | ICD-10-CM | POA: Insufficient documentation

## 2015-02-07 DIAGNOSIS — Z8639 Personal history of other endocrine, nutritional and metabolic disease: Secondary | ICD-10-CM | POA: Diagnosis not present

## 2015-02-07 DIAGNOSIS — Y9389 Activity, other specified: Secondary | ICD-10-CM | POA: Diagnosis not present

## 2015-02-07 DIAGNOSIS — Z7982 Long term (current) use of aspirin: Secondary | ICD-10-CM | POA: Diagnosis not present

## 2015-02-07 DIAGNOSIS — Z8739 Personal history of other diseases of the musculoskeletal system and connective tissue: Secondary | ICD-10-CM | POA: Insufficient documentation

## 2015-02-07 DIAGNOSIS — Z79899 Other long term (current) drug therapy: Secondary | ICD-10-CM | POA: Diagnosis not present

## 2015-02-07 DIAGNOSIS — S199XXA Unspecified injury of neck, initial encounter: Secondary | ICD-10-CM | POA: Insufficient documentation

## 2015-02-07 DIAGNOSIS — Z8673 Personal history of transient ischemic attack (TIA), and cerebral infarction without residual deficits: Secondary | ICD-10-CM | POA: Insufficient documentation

## 2015-02-07 DIAGNOSIS — S40212A Abrasion of left shoulder, initial encounter: Secondary | ICD-10-CM | POA: Diagnosis not present

## 2015-02-07 DIAGNOSIS — Y998 Other external cause status: Secondary | ICD-10-CM | POA: Insufficient documentation

## 2015-02-07 DIAGNOSIS — I10 Essential (primary) hypertension: Secondary | ICD-10-CM | POA: Diagnosis not present

## 2015-02-07 DIAGNOSIS — M542 Cervicalgia: Secondary | ICD-10-CM | POA: Diagnosis not present

## 2015-02-07 MED ORDER — NAPROXEN 375 MG PO TABS: 375.0000 mg | ORAL_TABLET | Freq: Two times a day (BID) | ORAL | Status: DC

## 2015-02-07 MED ORDER — HYDROCODONE-ACETAMINOPHEN 5-325 MG PO TABS: 1.0000 | ORAL_TABLET | ORAL | Status: DC | PRN

## 2015-02-07 MED ORDER — HYDROCODONE-ACETAMINOPHEN 5-325 MG PO TABS
1.0000 | ORAL_TABLET | Freq: Once | ORAL | Status: AC
  Administered 2015-02-07: 1 via ORAL
  Filled 2015-02-07: qty 1

## 2015-02-07 NOTE — ED Notes (Signed)
Pt placed in c-collar at triage

## 2015-02-07 NOTE — ED Notes (Signed)
Patient transported to X-ray 
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
## 2015-02-07 NOTE — Discharge Instructions (Signed)
Please follow the directions provided.  Be sure to follow-up with your primary care provider to ensure you are getting better.  Use ice pack or heating pad to help with discomfort.  Don't hesitate to return for any new, worsening or concerning symptoms.    SEEK IMMEDIATE MEDICAL CARE IF:  You have numbness, tingling, or weakness in the arms or legs.  You develop severe headaches not relieved with medicine.  You have severe neck pain, especially tenderness in the middle of the back of your neck.  You have changes in bowel or bladder control.  There is increasing pain in any area of the body.  You have shortness of breath, light-headedness, dizziness, or fainting.  You have chest pain.  You feel sick to your stomach (nauseous), throw up (vomit), or sweat.  You have increasing abdominal discomfort.  There is blood in your urine, stool, or vomit.  You have pain in your shoulder (shoulder strap areas).  You feel your symptoms are getting worse.

## 2015-02-12 ENCOUNTER — Other Ambulatory Visit: Payer: Self-pay | Admitting: Family Medicine

## 2015-02-16 ENCOUNTER — Telehealth: Payer: Self-pay | Admitting: Family Medicine
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
## 2015-02-16 NOTE — Telephone Encounter (Signed)
Pt unable to come in Friday afternoon. Same day slot were taken this afternoon. Pt scheduled Monday am. Will call if cancellation,

## 2015-02-17 NOTE — Telephone Encounter (Signed)
Per Dr. Sarajane Jews, he would like to see pt today.

## 2015-02-20 ENCOUNTER — Ambulatory Visit: Payer: Medicare Other | Admitting: Family Medicine

## 2015-02-20 DIAGNOSIS — Z0289 Encounter for other administrative examinations: Secondary | ICD-10-CM

## 2015-02-20 NOTE — Telephone Encounter (Signed)
Pt was on schedule for 02/20/15, I would have called pt however there was no phone number listed on snap shot. Pt should reschedule appointment.

## 2015-02-22 ENCOUNTER — Telehealth: Payer: Self-pay | Admitting: Family Medicine

## 2015-02-22 ENCOUNTER — Ambulatory Visit: Payer: Medicare Other | Admitting: Family Medicine

## 2015-02-22 NOTE — Telephone Encounter (Signed)
Error

## 2015-02-23 NOTE — Telephone Encounter (Signed)
Pt has resc for Friday, 5/13 at 9 am.

## 2015-02-24 ENCOUNTER — Encounter: Payer: Self-pay | Admitting: Family Medicine

## 2015-02-24 ENCOUNTER — Ambulatory Visit (INDEPENDENT_AMBULATORY_CARE_PROVIDER_SITE_OTHER): Payer: Medicare Other | Admitting: Family Medicine

## 2015-02-24 VITALS — BP 144/84 | HR 55 | Temp 98.6°F | Ht 60.0 in | Wt 167.0 lb

## 2015-02-24 DIAGNOSIS — S161XXD Strain of muscle, fascia and tendon at neck level, subsequent encounter: Secondary | ICD-10-CM | POA: Diagnosis not present

## 2015-02-24 DIAGNOSIS — R739 Hyperglycemia, unspecified: Secondary | ICD-10-CM

## 2015-02-24 DIAGNOSIS — R350 Frequency of micturition: Secondary | ICD-10-CM

## 2015-02-24 LAB — POCT URINALYSIS DIPSTICK
Bilirubin, UA: NEGATIVE
Blood, UA: NEGATIVE
Glucose, UA: NEGATIVE
KETONES UA: NEGATIVE
Nitrite, UA: NEGATIVE
PH UA: 7
SPEC GRAV UA: 1.01
Urobilinogen, UA: 0.2

## 2015-02-24 LAB — HEMOGLOBIN A1C: Hgb A1c MFr Bld: 6.1 % (ref 4.6–6.5)
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
## 2015-02-24 NOTE — Progress Notes (Signed)
Pre visit review using our clinic review tool, if applicable. No additional management support is needed unless otherwise documented below in the visit note. 

## 2015-02-27 ENCOUNTER — Ambulatory Visit: Payer: Medicare Other | Admitting: Family Medicine

## 2015-03-20 ENCOUNTER — Telehealth: Payer: Self-pay

## 2015-03-20 DIAGNOSIS — Z1231 Encounter for screening mammogram for malignant neoplasm of breast: Secondary | ICD-10-CM

## 2015-03-20 NOTE — Telephone Encounter (Signed)
Pt called back, spoke with her concerning mammogram, ordered.

## 2015-03-20 NOTE — Telephone Encounter (Signed)
Left mess. On home voice mail for her to call us back (concerning her overdue mammogram)

## 2015-03-22 ENCOUNTER — Ambulatory Visit: Payer: Medicare Other | Attending: Family Medicine | Admitting: Physical Therapy

## 2015-03-22 ENCOUNTER — Telehealth: Payer: Self-pay | Admitting: Physical Therapy

## 2015-03-22 DIAGNOSIS — R29898 Other symptoms and signs involving the musculoskeletal system: Secondary | ICD-10-CM | POA: Insufficient documentation

## 2015-03-22 DIAGNOSIS — M436 Torticollis: Secondary | ICD-10-CM | POA: Diagnosis not present

## 2015-03-22 DIAGNOSIS — M542 Cervicalgia: Secondary | ICD-10-CM | POA: Diagnosis not present

## 2015-03-22 DIAGNOSIS — R208 Other disturbances of skin sensation: Secondary | ICD-10-CM | POA: Diagnosis not present

## 2015-03-22 DIAGNOSIS — R209 Unspecified disturbances of skin sensation: Secondary | ICD-10-CM
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
## 2015-03-22 NOTE — Patient Instructions (Signed)
  Levator Scapula Stretch, Sitting   Sit, one hand tucked under hip on side to be stretched, other hand over top of head. Turn head toward other side and look down. Use hand on head to gently stretch neck in that position. Hold _30__ seconds. Repeat ___ times per session. Do ___ sessions per day.  Copyright  VHI. All rights reserved.   Flexibility: Upper Trapezius Stretch   Gently grasp right side of head while reaching behind back with other hand. Tilt head away until a gentle stretch is felt. Hold _30___ seconds. Repeat _2-3___ times per set. Do ___1_ sets per session. Do _2-3___ sessions per day.  http://orth.exer.us/341   Copyright  VHI. All rights reserved.

## 2015-04-10 ENCOUNTER — Ambulatory Visit: Payer: Medicare Other | Admitting: Physical Therapy

## 2015-04-10 DIAGNOSIS — M542 Cervicalgia: Secondary | ICD-10-CM | POA: Diagnosis not present

## 2015-04-10 DIAGNOSIS — M436 Torticollis: Secondary | ICD-10-CM

## 2015-04-10 DIAGNOSIS — R29898 Other symptoms and signs involving the musculoskeletal system: Secondary | ICD-10-CM

## 2015-04-10 DIAGNOSIS — R208 Other disturbances of skin sensation: Secondary | ICD-10-CM | POA: Diagnosis not present

## 2015-04-10 DIAGNOSIS — R209 Unspecified disturbances of skin sensation: Secondary | ICD-10-CM

## 2015-04-10 NOTE — Therapy (Signed)
[place], [place], [address] Phone: [phone]   Fax:  [phone]  Physical Therapy Treatment  Patient Details  Name: Anne [name] MRN: [id number] Date of Birth: [birth year] Referring Provider:  Laurey Morale, MD  Encounter Date: 04/10/2015      PT End of Session - 04/10/15 0815    Visit Number 2   Number of Visits 16   Date for PT Re-Evaluation 05/17/15   PT Start Time 0810  pt on time but did not check in until 0809   PT Stop Time 0905   PT Time Calculation (min) 55 min      Past Medical History  Diagnosis Date  . Hyperlipidemia   . Hypertension   . Stroke 09-2007    has mild left leg weakness, saw Dr. Brett Fairy  . Osteopenia     per DEXA on 07-19-09    No past surgical history on file.  There were no vitals filed for this visit.  Visit Diagnosis:  Stiffness of cervical spine  Sensory disturbance  Tight unbalanced muscles  Pain in neck      Subjective Assessment - 04/10/15 0811    Subjective Reports stiffness, diff turning head to L, hand hurts/tingling at night.    Currently in Pain? Yes   Pain Score 7    Pain Location Neck   Pain Orientation Left   Pain Descriptors / Indicators Tightness   Pain Type Chronic pain   Pain Radiating Towards LUE   Aggravating Factors  turning head   Pain Relieving Factors rest   Multiple Pain Sites No           [facility] Adult PT Treatment/Exercise - 04/10/15 0814    Neck Exercises: Theraband   Scapula Retraction 20 reps;Red   Shoulder Extension 20 reps;Red   Horizontal ABduction 20 reps;Red   Moist Heat Therapy   Number Minutes Moist Heat 10 Minutes   Moist Heat Location Cervical   Manual Therapy   Manual Therapy Soft tissue mobilization;Myofascial release;Passive ROM   Soft tissue mobilization bilateral cervicals, upper trap   Myofascial Release neck and upper back   Passive ROM rotation R/L and lateral flexion Rt/Lt   Neck  Exercises: Stretches   Upper Trapezius Stretch 2 reps;30 seconds   Upper Trapezius Stretch Limitations each side   Levator Stretch 2 reps;30 seconds   Levator Stretch Limitations each side   Corner Stretch 3 reps;30 seconds           PT Education - 04/10/15 1111    Education Details HEP, trigger points, posture   Person(s) Educated Patient   Methods Explanation;Demonstration;Handout;Verbal cues   Comprehension Verbalized understanding;Returned demonstration          PT Short Term Goals - 04/10/15 1156    PT SHORT TERM GOAL #1   Title Pt will be I with HEP for C ROM and posture   Status On-going   PT SHORT TERM GOAL #2   Title Pt will report less than 2 headaches per week   Status On-going   PT SHORT TERM GOAL #3   Title Pt will report 25% less plan and symptoms in Lt UE   Status On-going           PT Long Term Goals - 04/10/15 1156    PT LONG TERM GOAL #1   Title Pt will be able to sleep with min disturbance from pain and sensory sx   Status On-going   PT  LONG TERM GOAL #2   Title Pt will demo normal strength in UEs (4+/5 or more) without neck pain   Status On-going   PT LONG TERM GOAL #3   Title Pt will be I with advanced HEP for posture and C-ROM, strength   Status On-going   PT LONG TERM GOAL #4   Title Pt will be able to report normal grip and fine motor skills to return to sewing, painting   Status On-going               Plan - 04/10/15 1158    Clinical Impression Statement Patient without relief of sx, still has pain with L rotation, trigger points and headaches.  She has sesnory sx in L hand which wakes her from sleep. She has been doing stretches incorrectly.  Was given alternative handout today which does not involve use of her other hand, as well as other postural ex.    PT Next Visit Plan check HEP   PT Home Exercise Plan given upper trap and levator stretch, chin tuck and rotation added corner stretch scap row and shldr ext.    Consulted  and Agree with Plan of Care Patient        Problem List Patient Active Problem List   Diagnosis Date Noted  . Mallet deformity of fourth finger, right 02/02/2014  . Otitis externa 06/24/2012  . Malignant hypertension 03/10/2012  . ACUTE BRONCHITIS 09/14/2010  . ALLERGIC RHINITIS 05/08/2010  . DIZZINESS 02/01/2010  . LIPOMA 08/11/2009  . OSTEOPENIA 08/11/2009  . SUPRAVENTRICULAR PREMATURE BEATS 07/28/2008  . FATIGUE 06/16/2008  . MYOSITIS 05/12/2008  . CERVICAL STRAIN 05/12/2008  . OTITIS EXTERNA 04/15/2008  . CERUMEN IMPACTION 04/07/2008  . HYPERLIPIDEMIA 11/24/2007  . HYPERTENSION 11/24/2007  . CEREBROVASCULAR ACCIDENT, HX OF 11/24/2007    Kambri Dismore 04/10/2015, 12:01 PM  [facility] [address] [place], [place], [address] Phone: [phone]   Fax:  [phone]  Raeford Razor, PT 04/10/2015 12:01 PM Phone: [phone] Fax: [phone]

## 2015-04-12 ENCOUNTER — Ambulatory Visit: Payer: Medicare Other | Admitting: Physical Therapy

## 2015-04-12 DIAGNOSIS — M542 Cervicalgia: Secondary | ICD-10-CM

## 2015-04-12 DIAGNOSIS — R29898 Other symptoms and signs involving the musculoskeletal system: Secondary | ICD-10-CM

## 2015-04-12 DIAGNOSIS — R208 Other disturbances of skin sensation: Secondary | ICD-10-CM | POA: Diagnosis not present

## 2015-04-12 DIAGNOSIS — R209 Unspecified disturbances of skin sensation: Secondary | ICD-10-CM

## 2015-04-12 DIAGNOSIS — M436 Torticollis: Secondary | ICD-10-CM | POA: Diagnosis not present

## 2015-04-12 NOTE — Therapy (Signed)
Anne [name], [place], [address] Phone: [phone]   Fax:  [phone]  Physical Therapy Treatment  Patient Details  Name: Anne [name] MRN: [id number] Date of Birth: 30-Jan-1946 Referring Provider:  Laurey Morale, MD  Encounter Date: 04/12/2015      PT End of Session - 04/12/15 0825    Visit Number 3   Number of Visits 16   Date for PT Re-Evaluation 05/17/15   PT Start Time 0805   Activity Tolerance Patient tolerated treatment well      Past Medical History  Diagnosis Date  . Hyperlipidemia   . Hypertension   . Stroke 09-2007    has mild left leg weakness, saw Dr. Brett Fairy  . Osteopenia     per DEXA on 07-19-09    No past surgical history on file.  There were no vitals filed for this visit.  Visit Diagnosis:  Stiffness of cervical spine  Sensory disturbance  Tight unbalanced muscles  Pain in neck      Subjective Assessment - 04/12/15 0809    Subjective I was better after last treatment, less pain and tingling at night. Still pain with L rotation   Currently in Pain? No/denies            [facility] Adult PT Treatment/Exercise - 04/12/15 0810    Neck Exercises: Machines for Strengthening   UBE (Upper Arm Bike) level 1 for 5 min corrected posture   Neck Exercises: Theraband   Shoulder Extension 20 reps;Red   Rows 20 reps;Red   Neck Exercises: Supine   Neck Retraction 10 reps   Neck Retraction Limitations 3 sets, sl rot R/L    Cervical Rotation Both;10 reps   Cervical Rotation Limitations AROM   Upper Extremity D1 Flexion;10 reps  bilat   Other Supine Exercise horiz abd red band x 10    Moist Heat Therapy   Number Minutes Moist Heat 15 Minutes   Moist Heat Location Cervical   Manual Therapy   Manual Therapy Soft tissue mobilization;Myofascial release;Passive ROM   Soft tissue mobilization bilateral cervicals, upper trap   Myofascial Release neck and upper back   Passive ROM  rotation R/L and lateral flexion Rt/Lt                PT Education - 04/12/15 0823    Education provided Yes   Education Details posture and stabilization   Person(s) Educated Patient   Methods Explanation   Comprehension Verbalized understanding;Verbal cues required          PT Short Term Goals - 04/10/15 1156    PT SHORT TERM GOAL #1   Title Pt will be I with HEP for C ROM and posture   Status On-going   PT SHORT TERM GOAL #2   Title Pt will report less than 2 headaches per week   Status On-going   PT SHORT TERM GOAL #3   Title Pt will report 25% less plan and symptoms in Lt UE   Status On-going           PT Long Term Goals - 04/10/15 1156    PT LONG TERM GOAL #1   Title Pt will be able to sleep with min disturbance from pain and sensory sx   Status On-going   PT LONG TERM GOAL #2   Title Pt will demo normal strength in UEs (4+/5 or more) without neck pain   Status On-going   PT LONG TERM GOAL #  3   Title Pt will be I with advanced HEP for posture and C-ROM, strength   Status On-going   PT LONG TERM GOAL #4   Title Pt will be able to report normal grip and fine motor skills to return to sewing, painting   Status On-going               Plan - 04/12/15 0825    Clinical Impression Statement Good response from treatment, able to report no pain at rest.  Continues with upper body weakness.   PT Next Visit Plan progress cervical stab, posture and repeat manual, heat., measure ROM and check goals    PT Home Exercise Plan given upper trap and levator stretch, chin tuck and rotation added corner stretch scap row and shldr ext.    Consulted and Agree with Plan of Care Patient        Problem List Patient Active Problem List   Diagnosis Date Noted  . Mallet deformity of fourth finger, right 02/02/2014  . Otitis externa 06/24/2012  . Malignant hypertension 03/10/2012  . ACUTE BRONCHITIS 09/14/2010  . ALLERGIC RHINITIS 05/08/2010  . DIZZINESS  02/01/2010  . LIPOMA 08/11/2009  . OSTEOPENIA 08/11/2009  . SUPRAVENTRICULAR PREMATURE BEATS 07/28/2008  . FATIGUE 06/16/2008  . MYOSITIS 05/12/2008  . CERVICAL STRAIN 05/12/2008  . OTITIS EXTERNA 04/15/2008  . CERUMEN IMPACTION 04/07/2008  . HYPERLIPIDEMIA 11/24/2007  . HYPERTENSION 11/24/2007  . CEREBROVASCULAR ACCIDENT, HX OF 11/24/2007    Anne [name] 04/12/2015, 8:46 AM  [facility] [address] [place], [place], [address] Phone: [phone]   Fax:  [phone]   Raeford Razor, PT 04/12/2015 8:46 AM Phone: [phone] Fax: [phone]

## 2015-04-20 ENCOUNTER — Ambulatory Visit: Payer: Medicare Other | Admitting: Physical Therapy

## 2015-04-24 ENCOUNTER — Ambulatory Visit: Payer: Medicare Other | Admitting: Physical Therapy

## 2015-04-26 ENCOUNTER — Ambulatory Visit: Payer: Medicare Other | Admitting: Physical Therapy

## 2015-05-01 ENCOUNTER — Ambulatory Visit: Payer: Medicare Other | Attending: Family Medicine | Admitting: Physical Therapy

## 2015-05-01 DIAGNOSIS — R208 Other disturbances of skin sensation: Secondary | ICD-10-CM | POA: Insufficient documentation

## 2015-05-01 DIAGNOSIS — M436 Torticollis: Secondary | ICD-10-CM

## 2015-05-01 DIAGNOSIS — M542 Cervicalgia: Secondary | ICD-10-CM

## 2015-05-01 DIAGNOSIS — R29898 Other symptoms and signs involving the musculoskeletal system: Secondary | ICD-10-CM | POA: Diagnosis not present

## 2015-05-01 DIAGNOSIS — R209 Unspecified disturbances of skin sensation: Secondary | ICD-10-CM

## 2015-05-01 NOTE — Therapy (Signed)
[facility] [address] , [place], [address] Phone: [phone]   Fax:  [phone]  Physical Therapy Treatment  Patient Details  Name: Anne [name] MRN: [id number] Date of Birth: 07/20/1946 Referring Provider:  Fry, Stephen A, MD  Encounter Date: 05/01/2015      PT End of Session - 05/01/15 0825    Visit Number 4   Number of Visits 16   Date for PT Re-Evaluation 05/17/15   PT Start Time 0806   Activity Tolerance Patient tolerated treatment well      Past Medical History  Diagnosis Date  . Hyperlipidemia   . Hypertension   . Stroke 09-2007    has mild left leg weakness, saw Dr. Dohmeier  . Osteopenia     per DEXA on 07-19-09    No past surgical history on file.  There were no vitals filed for this visit.  Visit Diagnosis:  Stiffness of cervical spine  Sensory disturbance  Tight unbalanced muscles  Pain in neck      Subjective Assessment - 05/01/15 0804    Subjective Was sick last week.  Feel good after treatment, but stiffness and R pain in hand return.     Currently in Pain? Yes   Pain Score 5    Pain Location Neck   Pain Orientation Left;Right;Posterior   Pain Type Chronic pain   Pain Radiating Towards R UE   Pain Onset More than a month ago   Pain Frequency Intermittent   Aggravating Factors  turning head   Pain Relieving Factors rest, heat            [facility] PT Assessment - 05/01/15 0807    AROM   Cervical Flexion 60   Cervical Extension 30   Cervical - Right Side Bend 30   Cervical - Left Side Bend 37   Cervical - Right Rotation 53   Cervical - Left Rotation 48            [facility] Adult PT Treatment/Exercise - 05/01/15 0811    Neck Exercises: Supine   Neck Retraction 10 reps;5 secs   Cervical Rotation Both   Cervical Rotation Limitations AROM 30 sec x 2    Lateral Flexion Both   Lateral Flexion Limitations AROM 30 sec x 2   Shoulder Flexion Both;10 reps   Shoulder  Flexion Weights (lbs) used red band    Other Supine Exercise horiz abd red band x 10    Shoulder Exercises: ROM/Strengthening   UBE (Upper Arm Bike) 5 min level 1 with corrected posture   Moist Heat Therapy   Number Minutes Moist Heat 15 Minutes   Moist Heat Location Cervical   Electrical Stimulation   Electrical Stimulation Location post cervicals/upper back   Electrical Stimulation Action IFC   Electrical Stimulation Parameters 8   Electrical Stimulation Goals Pain                PT Education - 05/01/15 0825    Education provided Yes   Education Details reinforce HEP   Person(s) Educated Patient   Methods Explanation   Comprehension Verbalized understanding          PT Short Term Goals - 05/01/15 0826    PT SHORT TERM GOAL #1   Title Pt will be I with HEP for C ROM and posture   Status Partially Met   PT SHORT TERM GOAL #2   Title Pt will report less than 2 headaches per week     Baseline 7/18 approx 4 per week   Status On-going   PT SHORT TERM GOAL #3   Title Pt will report 25% less pain and symptoms in both UE   Status Achieved           PT Long Term Goals - 05/01/15 6546    PT LONG TERM GOAL #1   Title Pt will be able to sleep with min disturbance from pain and sensory sx   Status On-going   PT LONG TERM GOAL #2   Title Pt will demo normal strength in UEs (4+/5 or more) without neck pain   Status On-going   PT LONG TERM GOAL #3   Title Pt will be I with advanced HEP for posture and C-ROM, strength   Status On-going   PT LONG TERM GOAL #4   Title Pt will be able to report normal grip and fine motor skills to return to sewing, painting   Status On-going               Plan - 05/01/15 0826    Clinical Impression Statement Patient presented before treatment with increased neck stiffness, was able to demo increased rotation after exercises.  She reports doing HEP occasionally while sick.  Encouraged to return and pay attn to posture. Headaches  are better overall.    PT Next Visit Plan progress cervical stab, posture and repeat manual, heat., measure ROM and check goals    PT Home Exercise Plan given upper trap and levator stretch, chin tuck and rotation added corner stretch scap row and shldr ext.    Consulted and Agree with Plan of Care Patient        Problem List Patient Active Problem List   Diagnosis Date Noted  . Mallet deformity of fourth finger, right 02/02/2014  . Otitis externa 06/24/2012  . Malignant hypertension 03/10/2012  . ACUTE BRONCHITIS 09/14/2010  . ALLERGIC RHINITIS 05/08/2010  . DIZZINESS 02/01/2010  . LIPOMA 08/11/2009  . OSTEOPENIA 08/11/2009  . SUPRAVENTRICULAR PREMATURE BEATS 07/28/2008  . FATIGUE 06/16/2008  . MYOSITIS 05/12/2008  . CERVICAL STRAIN 05/12/2008  . OTITIS EXTERNA 04/15/2008  . CERUMEN IMPACTION 04/07/2008  . HYPERLIPIDEMIA 11/24/2007  . HYPERTENSION 11/24/2007  . CEREBROVASCULAR ACCIDENT, HX OF 11/24/2007    Trenita Hulme 05/01/2015, 8:42 AM  [facility] [address] [place], [place], [address] Phone: [phone]   Fax:  [phone]    Raeford Razor, PT 05/01/2015 8:42 AM Phone: [phone] Fax: [phone]

## 2015-05-03 ENCOUNTER — Ambulatory Visit: Payer: Medicare Other | Admitting: Physical Therapy

## 2015-05-03 DIAGNOSIS — R29898 Other symptoms and signs involving the musculoskeletal system: Secondary | ICD-10-CM

## 2015-05-03 DIAGNOSIS — M436 Torticollis: Secondary | ICD-10-CM | POA: Diagnosis not present

## 2015-05-03 DIAGNOSIS — M542 Cervicalgia: Secondary | ICD-10-CM

## 2015-05-03 DIAGNOSIS — R208 Other disturbances of skin sensation: Secondary | ICD-10-CM | POA: Diagnosis not present

## 2015-05-03 DIAGNOSIS — R209 Unspecified disturbances of skin sensation: Secondary | ICD-10-CM

## 2015-05-03 NOTE — Therapy (Addendum)
[place], [place], [address] Phone: [phone]   Fax:  [phone]  Physical Therapy Treatment  Patient Details  Name: Anne [name] MRN: [id number] Date of Birth: 1946/02/11 Referring Provider:  Laurey Morale, MD  Encounter Date: 05/03/2015      PT End of Session - 05/03/15 0824    Visit Number 5   Number of Visits 16   Date for PT Re-Evaluation 05/17/15   PT Start Time 0804   PT Stop Time 1027   PT Time Calculation (min) 51 min   Activity Tolerance Patient tolerated treatment well      Past Medical History  Diagnosis Date  . Hyperlipidemia   . Hypertension   . Stroke 09-2007    has mild left leg weakness, saw Dr. Brett Fairy  . Osteopenia     per DEXA on 07-19-09    No past surgical history on file.  There were no vitals filed for this visit.  Visit Diagnosis:  Stiffness of cervical spine  Sensory disturbance  Tight unbalanced muscles  Pain in neck      Subjective Assessment - 05/03/15 0810    Subjective Patient reports muscle twitching on L side of neck with IFC last time.  Encouraged her to ring the bell if she is uncomfortable.  No pain today, only stiffness in neck with rotation and also some L hand weakness.     Currently in Pain? No/denies            [facility] PT Assessment - 05/03/15 0826    Strength   Right Shoulder Flexion 4/5   Right Shoulder ABduction 5/5   Left Shoulder Flexion 4/5   Left Shoulder ABduction 5/5   Right Elbow Flexion 4+/5   Right Elbow Extension 5/5   Left Elbow Flexion 4/5   Left Elbow Extension 4/5                     [facility] Adult PT Treatment/Exercise - 05/03/15 0812    Neck Exercises: Machines for Strengthening   UBE (Upper Arm Bike) level 1, 3 min FW and 3 min back with corrected posture   Neck Exercises: Theraband   Shoulder Extension 10 reps;Other (comment)   Shoulder Extension Limitations yellow ext 2 x10  pt needs  reminders after each set for head/neck align.    Rows 10 reps;Other (comment)   Rows Limitations yellow 2 x10   Neck Exercises: Supine   Cervical Rotation Both;5 reps   Cervical Rotation Limitations AROM   Lateral Flexion Both;5 reps   Lateral Flexion Limitations AROM   Shoulder Exercises: ROM/Strengthening   Wall Pushups 10 reps   Pushups Limitations 2 sets  1 wide, 1 narrow   Moist Heat Therapy   Number Minutes Moist Heat 15 Minutes   Moist Heat Location Cervical   Manual Therapy   Passive ROM rotation, sidebending x3 each, no pain in supine      Cervical retraction with ball against wall x 15 reps.         PT Education - 05/03/15 0825    Education provided Yes   Education Details C-AROM and stiffness vs pain    Person(s) Educated Patient   Methods Explanation;Verbal cues   Comprehension Verbalized understanding;Need further instruction          PT Short Term Goals - 05/01/15 0826    PT SHORT TERM GOAL #1   Title Pt will be I with HEP for C  ROM and posture   Status Partially Met   PT SHORT TERM GOAL #2   Title Pt will report less than 2 headaches per week   Baseline 7/18 approx 4 per week   Status On-going   PT SHORT TERM GOAL #3   Title Pt will report 25% less pain and symptoms in both UE   Status Achieved           PT Long Term Goals - 05/01/15 3762    PT LONG TERM GOAL #1   Title Pt will be able to sleep with min disturbance from pain and sensory sx   Status On-going   PT LONG TERM GOAL #2   Title Pt will demo normal strength in UEs (4+/5 or more) without neck pain   Status On-going   PT LONG TERM GOAL #3   Title Pt will be I with advanced HEP for posture and C-ROM, strength   Status On-going   PT LONG TERM GOAL #4   Title Pt will be able to report normal grip and fine motor skills to return to sewing, painting   Status On-going               Plan - 05/03/15 0825    Clinical Impression Statement Patient continues to need cueing for  C-AROM, reports intermittent pain and sensory sx which interfere wth use of hand for carrying items and sewing and sleep/comfort at night.    PT Next Visit Plan cont strengthening, posture and AROM    PT Home Exercise Plan given upper trap and levator stretch, chin tuck and rotation added corner stretch scap row and shldr ext.    Consulted and Agree with Plan of Care Patient        Problem List Patient Active Problem List   Diagnosis Date Noted  . Mallet deformity of fourth finger, right 02/02/2014  . Otitis externa 06/24/2012  . Malignant hypertension 03/10/2012  . ACUTE BRONCHITIS 09/14/2010  . ALLERGIC RHINITIS 05/08/2010  . DIZZINESS 02/01/2010  . LIPOMA 08/11/2009  . OSTEOPENIA 08/11/2009  . SUPRAVENTRICULAR PREMATURE BEATS 07/28/2008  . FATIGUE 06/16/2008  . MYOSITIS 05/12/2008  . CERVICAL STRAIN 05/12/2008  . OTITIS EXTERNA 04/15/2008  . CERUMEN IMPACTION 04/07/2008  . HYPERLIPIDEMIA 11/24/2007  . HYPERTENSION 11/24/2007  . CEREBROVASCULAR ACCIDENT, HX OF 11/24/2007    Tacia Hindley 05/03/2015, 8:51 AM  [facility] [address] [place], [place], [address] Phone: [phone]   Fax:  [phone]   Raeford Razor, PT 05/03/2015 8:52 AM Phone: [phone] Fax: [phone]   PHYSICAL THERAPY DISCHARGE SUMMARY  Visits from Start of Care: 5  Current functional level related to goals / functional outcomes: See above, has not returned   Remaining deficits: See above   Education / Equipment: HEP, posture Plan: Patient agrees to discharge.  Patient goals were not met. Patient is being discharged due to not returning since the last visit.  ?????    Raeford Razor, PT 01/04/2016 2:54 PM Phone: [phone] Fax: [phone]

## 2015-06-05 ENCOUNTER — Other Ambulatory Visit: Payer: Self-pay | Admitting: Family Medicine

## 2015-06-05 NOTE — Telephone Encounter (Signed)
Can we refill this? 

## 2015-08-03 ENCOUNTER — Other Ambulatory Visit: Payer: Self-pay | Admitting: Family Medicine

## 2015-09-04 ENCOUNTER — Ambulatory Visit: Payer: Medicare Other | Admitting: Internal Medicine

## 2015-09-04 ENCOUNTER — Ambulatory Visit (INDEPENDENT_AMBULATORY_CARE_PROVIDER_SITE_OTHER): Payer: Medicare Other | Admitting: Family Medicine

## 2015-09-04 ENCOUNTER — Telehealth: Payer: Self-pay | Admitting: Family Medicine

## 2015-09-04 ENCOUNTER — Encounter: Payer: Self-pay | Admitting: Family Medicine

## 2015-09-04 VITALS — BP 118/76 | HR 56 | Temp 97.7°F | Ht 60.0 in | Wt 170.0 lb

## 2015-09-04 DIAGNOSIS — M79606 Pain in leg, unspecified: Secondary | ICD-10-CM

## 2015-09-04 NOTE — Progress Notes (Signed)
HPI:  Acute visit for:  Leg pain: -twisted R leg when stepped on curb wrong about 2 weeks ago -occ pain in post/medial and post lat lower thigh with certain movements -no weakness, numbness, radiation, severe pain at onset or now, persistent pain, inability to bear weight, fevers or malaise, swelling, redness  ROS: See pertinent positives and negatives per HPI.  Past Medical History  Diagnosis Date  . Hyperlipidemia   . Hypertension   . Stroke 09-2007    has mild left leg weakness, saw Dr. Brett Fairy  . Osteopenia     per DEXA on 07-19-09    No past surgical history on file.  No family history on file.  Social History   Social History  . Marital Status: Single    Spouse Name: N/A  . Number of Children: N/A  . Years of Education: N/A   Social History Main Topics  . Smoking status: Never Smoker   . Smokeless tobacco: Never Used  . Alcohol Use: 0.0 oz/week    0 Standard drinks or equivalent per week     Comment: rare  . Drug Use: No  . Sexual Activity: Not on file   Other Topics Concern  . Not on file   Social History Narrative     Current outpatient prescriptions:  .  acetaminophen (TYLENOL) 325 MG tablet, Take 650 mg by mouth every 6 (six) hours as needed for mild pain., Disp: , Rfl:  .  amLODipine (NORVASC) 10 MG tablet, TAKE 1 TABLET (10 MG TOTAL) BY MOUTH DAILY., Disp: 90 tablet, Rfl: 0 .  aspirin 81 MG tablet, Take 81 mg by mouth daily., Disp: , Rfl:  .  azithromycin (ZITHROMAX) 250 MG tablet, As directed, Disp: 6 tablet, Rfl: 0 .  Calcium-Vitamin D (CALTRATE 600 PLUS-VIT D PO), Take 1 each by mouth daily.  , Disp: , Rfl:  .  hydrochlorothiazide (HYDRODIURIL) 25 MG tablet, TAKE 1 TABLET (25 MG TOTAL) BY MOUTH DAILY., Disp: 90 tablet, Rfl: 0 .  HYDROcodone-acetaminophen (NORCO/VICODIN) 5-325 MG per tablet, Take 1 tablet by mouth every 4 (four) hours as needed. (Patient not taking: Reported on 03/22/2015), Disp: 6 tablet, Rfl: 0 .  HYDROcodone-homatropine  (HYDROMET) 5-1.5 MG/5ML syrup, Take 5 mLs by mouth every 4 (four) hours as needed., Disp: 240 mL, Rfl: 0 .  meclizine (ANTIVERT) 50 MG tablet, Take 1 tablet (50 mg total) by mouth 3 (three) times daily as needed for dizziness. (Patient not taking: Reported on 03/22/2015), Disp: 10 tablet, Rfl: 0 .  naproxen (NAPROSYN) 375 MG tablet, Take 1 tablet (375 mg total) by mouth 2 (two) times daily., Disp: 20 tablet, Rfl: 0 .  nebivolol (BYSTOLIC) 10 MG tablet, Take 2 tablets (20 mg total) by mouth daily., Disp: 180 tablet, Rfl: 3 .  neomycin-polymyxin-hydrocortisone (CORTISPORIN) otic solution, Place 4 drops into the left ear 4 (four) times daily. (Patient not taking: Reported on 03/22/2015), Disp: 10 mL, Rfl: 0 .  potassium chloride SA (K-DUR,KLOR-CON) 20 MEQ tablet, TAKE 1 TABLET (20 MEQ TOTAL) BY MOUTH DAILY., Disp: 90 tablet, Rfl: 3  EXAM:  There were no vitals filed for this visit.  There is no weight on file to calculate BMI.  GENERAL: vitals reviewed and listed above, alert, oriented, appears well hydrated and in no acute distress  HEENT: atraumatic, conjunttiva clear, no obvious abnormalities on inspection of external nose and ears  NECK: no obvious masses on inspection  MS: moves all extremities without noticeable abnormality, no findings on exam of the  knees or legs today, no effusion, no swelling, no redness, no sig difference in appearance L to R, no weakness or loss of sensation in LE bilat, neg lachman, neg drawer test knee, neg pat crep, neg mcmurry - points to lower medial hamstring as area of pain when experiences  PSYCH: pleasant and cooperative, no obvious depression or anxiety  ASSESSMENT AND PLAN:  Discussed the following assessment and plan:  Pain of lower extremity, unspecified laterality  -we discussed possible serious and likely etiologies, workup and treatment, treatment risks and return precautions - possible mild hamstring strain vs other, soubt meniscal injury but  discussed this as well -after this discussion, Alleen opted for HEP, analgesics if needed -follow up advised in 4 weeks if persists -of course, we advised Araya  to return or notify a doctor immediately if symptoms worsen or persist or new concerns arise.  -Patient advised to return or notify a doctor immediately if symptoms worsen or persist or new concerns arise.  Patient Instructions  Do the exercises at least 4 days per week  Use heat for 15 minutes twice daily if hurting - use towel to ensure no burning of skin  Use tyleno 500-1000mg  up to 3 times per day or ibuprofen 200mg  up to 2 times per day if needed for pain  Follow up with your doctor in 1 month if symptoms persist     KIM, HANNAH R.

## 2015-09-04 NOTE — Telephone Encounter (Signed)
Looks like a FYI 

## 2015-09-04 NOTE — Patient Instructions (Signed)
Do the exercises at least 4 days per week  Use heat for 15 minutes twice daily if hurting - use towel to ensure no burning of skin  Use tyleno 500-1000mg  up to 3 times per day or ibuprofen 200mg  up to 2 times per day if needed for pain  Follow up with your doctor in 1 month if symptoms persist

## 2015-09-04 NOTE — Telephone Encounter (Signed)
[facility] - Client TELEPHONE [place] Patient Name: NORIKO PROSCH DOB: [birth year] Initial Comment Caller states, injured her leg 2 weeks ago, it gets better then gets worse again. Seems to get worse with movement. Nurse Assessment Nurse: Ronnald Ramp, RN, Miranda Date/Time (Eastern Time): 09/04/2015 10:05:47 AM Confirm and document reason for call. If symptomatic, describe symptoms. ---Caller states 2 weeks ago, she slipped on a raised piece of concrete. She slid but did not fall. She is having pain in the back of left knee. Has the patient traveled out of the country within the last 30 days? ---Not Applicable Does the patient have any new or worsening symptoms? ---Yes Will a triage be completed? ---Yes Related visit to physician within the last 2 weeks? ---No Does the PT have any chronic conditions? (i.e. diabetes, asthma, etc.) ---Yes List chronic conditions. ---HTN Is this a behavioral health call? ---No Guidelines Guideline Title Affirmed Question Affirmed Notes Leg Injury [1] High-risk adult (e.g., age > 29, osteoporosis, chronic steroid use) AND [2] limping Final Disposition User See Physician within 24 Hours Jones, RN, Miranda Comments Pt was needing an appt earlier that what is available. She is going to check with her work and will call back to schedule something. Referrals GO TO FACILITY UNDECIDED Disagree/Comply: Comply

## 2015-09-16 ENCOUNTER — Other Ambulatory Visit: Payer: Self-pay | Admitting: Family Medicine

## 2015-10-02 ENCOUNTER — Other Ambulatory Visit: Payer: Self-pay | Admitting: Family Medicine

## 2015-10-18 ENCOUNTER — Other Ambulatory Visit: Payer: Self-pay

## 2015-10-18 MED ORDER — NEBIVOLOL HCL 10 MG PO TABS: 20.0000 mg | ORAL_TABLET | Freq: Every day | ORAL | Status: DC

## 2015-10-18 NOTE — Telephone Encounter (Signed)
Rx request for bystolic 10 mg tablet- Take 2 tablets by mouth daily #180.   Ph[facility] is [facility]   Rx sent to pharmacy.

## 2015-11-17 ENCOUNTER — Ambulatory Visit (INDEPENDENT_AMBULATORY_CARE_PROVIDER_SITE_OTHER): Payer: Medicare Other | Admitting: Family Medicine

## 2015-11-17 ENCOUNTER — Encounter: Payer: Self-pay | Admitting: Family Medicine

## 2015-11-17 ENCOUNTER — Telehealth: Payer: Self-pay | Admitting: Family Medicine

## 2015-11-17 VITALS — BP 130/73 | HR 51 | Temp 97.7°F | Ht 60.0 in | Wt 164.0 lb

## 2015-11-17 DIAGNOSIS — M25561 Pain in right knee: Secondary | ICD-10-CM | POA: Diagnosis not present

## 2015-11-17 DIAGNOSIS — Z23 Encounter for immunization: Secondary | ICD-10-CM

## 2015-11-17 NOTE — Telephone Encounter (Signed)
Pt is in today scheduled

## 2015-11-17 NOTE — Telephone Encounter (Signed)
Patient Name: Anne [name]  DOB: 1946/06/26    Initial Comment caller states she injured her right ankle and her left foot is tingling   Nurse Assessment  Nurse: Mallie Mussel, RN, Alveta Heimlich Date/Time Eilene Ghazi Time): 11/17/2015 1:57:41 PM  Confirm and document reason for call. If symptomatic, describe symptoms. You must click the next button to save text entered. ---Caller states that she injured her left leg in December. She has had problems since then. It will get better then it will get worse again. She states that she hurt her knee more than her ankle. She has some slight pain with movement. She states that she has had some tingling in her right foot which began about 2-3 days ago. She denies injury to her foot. She denies numbness in her toes. She is able to stand, bear weight and walk on the leg. She states that her continuing pain in her left leg is her concern today. She saw Dr. Maudie Mercury in December for her leg. She is wanting to make an appointment to see Dr. Sarajane Jews. She is also concerned about the tingling in her foot.  Has the patient traveled out of the country within the last 30 days? ---No  Does the patient have any new or worsening symptoms? ---Yes  Will a triage be completed? ---Yes  Related visit to physician within the last 2 weeks? ---No  Does the PT have any chronic conditions? (i.e. diabetes, asthma, etc.) ---Yes  List chronic conditions. ---HTN  Is this a behavioral health or substance abuse call? ---No     Guidelines    Guideline Title Affirmed Question Affirmed Notes  Neurologic Deficit [1] Tingling (e.g., pins and needles) of the face, arm / hand, or leg / foot on one side of the body AND [2] present now    Final Disposition User   See Physician within 4 Hours (or PCP triage) Mallie Mussel, RN, Alveta Heimlich    Comments  Appointment scheduled for today at 3:15pm with Dr. Alysia Penna.   Referrals  REFERRED TO PCP OFFICE   Disagree/Comply: Comply

## 2015-11-17 NOTE — Progress Notes (Signed)
Pre visit review using our clinic review tool, if applicable. No additional management support is needed unless otherwise documented below in the visit note. 

## 2015-11-20 ENCOUNTER — Encounter: Payer: Self-pay | Admitting: Family Medicine

## 2015-11-20 ENCOUNTER — Other Ambulatory Visit: Payer: Self-pay | Admitting: Family Medicine

## 2015-11-20 DIAGNOSIS — Z1231 Encounter for screening mammogram for malignant neoplasm of breast: Secondary | ICD-10-CM

## 2015-11-20 NOTE — Progress Notes (Signed)
   Subjective:    Patient ID: Anne [name], female    DOB: 28-Oct-1945, 70 y.o.   MRN: [id number]  HPI Here to check her right knee that was injured in early November when she stepped off a curb and twisted the leg. She has had pain and stiffness in the knee ever since. No swelling or locking or giving way. She has used Tylenol or naproxen off and on.    Review of Systems  Constitutional: Negative.   Musculoskeletal: Positive for arthralgias and gait problem. Negative for back pain and joint swelling.       Objective:   Physical Exam  Constitutional: She appears well-developed and well-nourished.  Musculoskeletal:  The right knee shows no swelling or warmth or erythema. It is tender along the medial joint space. No crepitus. Full ROM. Negative anterior drawer          Assessment & Plan:  Knee pain, possibly due to a torn meniscus. Wear an elastic support sleeve. Refer to Orthopedics.

## 2015-11-23 DIAGNOSIS — M1711 Unilateral primary osteoarthritis, right knee: Secondary | ICD-10-CM | POA: Diagnosis not present

## 2015-12-26 ENCOUNTER — Telehealth: Payer: Self-pay | Admitting: Family Medicine

## 2015-12-26 ENCOUNTER — Encounter (HOSPITAL_COMMUNITY): Payer: Self-pay | Admitting: *Deleted

## 2015-12-26 ENCOUNTER — Emergency Department (HOSPITAL_COMMUNITY)
Admission: EM | Admit: 2015-12-26 | Discharge: 2015-12-26 | Disposition: A | Discharge status: Home / Self Care | Payer: Medicare Other | Attending: Emergency Medicine | Admitting: Emergency Medicine

## 2015-12-26 DIAGNOSIS — Z7982 Long term (current) use of aspirin: Secondary | ICD-10-CM | POA: Diagnosis not present

## 2015-12-26 DIAGNOSIS — Z8673 Personal history of transient ischemic attack (TIA), and cerebral infarction without residual deficits: Secondary | ICD-10-CM | POA: Insufficient documentation

## 2015-12-26 DIAGNOSIS — Z79899 Other long term (current) drug therapy: Secondary | ICD-10-CM | POA: Diagnosis not present

## 2015-12-26 DIAGNOSIS — R51 Headache: Secondary | ICD-10-CM | POA: Diagnosis not present

## 2015-12-26 DIAGNOSIS — Z8639 Personal history of other endocrine, nutritional and metabolic disease: Secondary | ICD-10-CM | POA: Insufficient documentation

## 2015-12-26 DIAGNOSIS — R05 Cough: Secondary | ICD-10-CM | POA: Diagnosis not present

## 2015-12-26 DIAGNOSIS — Z8739 Personal history of other diseases of the musculoskeletal system and connective tissue: Secondary | ICD-10-CM | POA: Insufficient documentation

## 2015-12-26 DIAGNOSIS — R067 Sneezing: Secondary | ICD-10-CM | POA: Insufficient documentation

## 2015-12-26 DIAGNOSIS — Z791 Long term (current) use of non-steroidal anti-inflammatories (NSAID): Secondary | ICD-10-CM | POA: Insufficient documentation

## 2015-12-26 DIAGNOSIS — Z7951 Long term (current) use of inhaled steroids: Secondary | ICD-10-CM | POA: Diagnosis not present

## 2015-12-26 DIAGNOSIS — I10 Essential (primary) hypertension: Secondary | ICD-10-CM | POA: Insufficient documentation

## 2015-12-26 DIAGNOSIS — R062 Wheezing: Secondary | ICD-10-CM | POA: Insufficient documentation

## 2015-12-26 DIAGNOSIS — R059 Cough, unspecified: Secondary | ICD-10-CM

## 2015-12-26 DIAGNOSIS — R0981 Nasal congestion: Secondary | ICD-10-CM | POA: Diagnosis not present

## 2015-12-26 MED ORDER — FLUTICASONE PROPIONATE 50 MCG/ACT NA SUSP: 1.0000 | Freq: Every day | NASAL | Status: DC

## 2015-12-26 NOTE — Discharge Instructions (Signed)
Your symptoms are likely due to a virus and possibly allergies. Please take your Flonase as prescribed. Follow-up with your doctor for reevaluation next week. Return to ED for any new or worsening symptoms as we discussed  Allergies An allergy is an abnormal reaction to a substance by the body's defense system (immune system). Allergies can develop at any age. WHAT CAUSES ALLERGIES? An allergic reaction happens when the immune system mistakenly reacts to a normally harmless substance, called an allergen, as if it were harmful. The immune system releases antibodies to fight the substance. Antibodies eventually release a chemical called histamine into the bloodstream. The release of histamine is meant to protect the body from infection, but it also causes discomfort. An allergic reaction can be triggered by:  Eating an allergen.  Inhaling an allergen.  Touching an allergen. WHAT TYPES OF ALLERGIES ARE THERE? There are many types of allergies. Common types include:  Seasonal allergies. People with this type of allergy are usually allergic to substances that are only present during certain seasons, such as molds and pollens.  Food allergies.  Drug allergies.  Insect allergies.  Animal dander allergies. WHAT ARE SYMPTOMS OF ALLERGIES? Possible allergy symptoms include:  Swelling of the lips, face, tongue, mouth, or throat.  Sneezing, coughing, or wheezing.  Nasal congestion.  Tingling in the mouth.  Rash.  Itching.  Itchy, red, swollen areas of skin (hives).  Watery eyes.  Vomiting.  Diarrhea.  Dizziness.  Lightheadedness.  Fainting.  Trouble breathing or swallowing.  Chest tightness.  Rapid heartbeat. HOW ARE ALLERGIES DIAGNOSED? Allergies are diagnosed with a medical and family history and one or more of the following:  Skin tests.  Blood tests.  A food diary. A food diary is a record of all the foods and drinks you have in a day and of all the symptoms  you experience.  The results of an elimination diet. An elimination diet involves eliminating foods from your diet and then adding them back in one by one to find out if a certain food causes an allergic reaction. HOW ARE ALLERGIES TREATED? There is no cure for allergies, but allergic reactions can be treated with medicine. Severe reactions usually need to be treated at a hospital. HOW CAN REACTIONS BE PREVENTED? The best way to prevent an allergic reaction is by avoiding the substance you are allergic to. Allergy shots and medicines can also help prevent reactions in some cases. People with severe allergic reactions may be able to prevent a life-threatening reaction called anaphylaxis with a medicine given right after exposure to the allergen.   This information is not intended to replace advice given to you by your health care provider. Make sure you discuss any questions you have with your health care provider.   Document Released: 12/24/2002 Document Revised: 10/21/2014 Document Reviewed: 07/12/2014 Elsevier Interactive Patient Education [employer].

## 2015-12-26 NOTE — ED Notes (Signed)
Patient here with cough, congestion and headache x 2-3 datys. Reports that the symptoms started after the flu shot

## 2015-12-26 NOTE — ED Provider Notes (Signed)
History  By signing my name below, I, Anne [name], attest that this documentation has been prepared under the direction and in the presence of [employer], PA-C. Electronically Signed: Marlowe [name], ED Scribe. 12/26/2015. 1:01 PM.  Chief Complaint  Patient presents with  . Cough  . Nasal Congestion   The history is provided by the patient and medical records. No language interpreter was used.    HPI Comments:  Anne [name] is a 70 y.o. female who presents to the Emergency Department complaining of a cough that started dry but has now progressed into a productive cough of phlegm that began about 3 weeks ago. She reports associated sneezing, congestion and HA. She reports the symptoms started with a sore throat that has since resolved. She reports some intermittent wheezing. Pt reports contacting her PCP and was advised to come here since they had no available appointments. She has taken Coricidin and Claritin with some relief of the symptoms. She is not taking any medications at this time. She denies modifying factors. She denies fever, chills, nausea, vomiting. Pt states she received the flu vaccination about 3 weeks ago.  Past Medical History  Diagnosis Date  . Hyperlipidemia   . Hypertension   . Stroke [facility]) 09-2007    has mild left leg weakness, saw Dr. Brett Fairy  . Osteopenia     per DEXA on 07-19-09   History reviewed. No pertinent past surgical history. No family history on file. Social History  Substance Use Topics  . Smoking status: Never Smoker   . Smokeless tobacco: Never Used  . Alcohol Use: 0.0 oz/week    0 Standard drinks or equivalent per week     Comment: rare   OB History    No data available     Review of Systems A complete 10 system review of systems was obtained and all systems are negative except as noted in the HPI and PMH.   Allergies  Iodine  Home Medications   Prior to Admission medications   Medication Sig Start Date End Date Taking?  Authorizing Provider  acetaminophen (TYLENOL) 325 MG tablet Take 650 mg by mouth every 6 (six) hours as needed for mild pain.    Historical Provider, MD  amLODipine (NORVASC) 10 MG tablet TAKE 1 TABLET (10 MG TOTAL) BY MOUTH DAILY. 08/03/15   Laurey Morale, MD  aspirin 81 MG tablet Take 81 mg by mouth daily.    Historical Provider, MD  Calcium-Vitamin D (CALTRATE 600 PLUS-VIT D PO) Take 1 each by mouth daily.      Historical Provider, MD  fluticasone (FLONASE) 50 MCG/ACT nasal spray Place 1 spray into both nostrils daily. 12/26/15   Comer Locket, PA-C  hydrochlorothiazide (HYDRODIURIL) 25 MG tablet TAKE 1 TABLET (25 MG TOTAL) BY MOUTH DAILY. 10/02/15   Laurey Morale, MD  naproxen (NAPROSYN) 375 MG tablet Take 1 tablet (375 mg total) by mouth 2 (two) times daily. 02/07/15   Britt Bottom, NP  nebivolol (BYSTOLIC) 10 MG tablet Take 2 tablets (20 mg total) by mouth daily. 10/18/15   Laurey Morale, MD  potassium chloride SA (K-DUR,KLOR-CON) 20 MEQ tablet TAKE 1 TABLET (20 MEQ TOTAL) BY MOUTH DAILY. 06/06/15   Laurey Morale, MD   Triage Vitals: BP 135/75 mmHg  Pulse 97  Temp(Src) 97.8 F (36.6 C) (Oral)  Resp 18  SpO2 99% Physical Exam  Constitutional: She is oriented to person, place, and time. She appears well-developed and well-nourished.  HENT:  Head:  Normocephalic and atraumatic.  Mouth/Throat: Uvula is midline, oropharynx is clear and moist and mucous membranes are normal. No oropharyngeal exudate.  Eyes: Conjunctivae and EOM are normal. Right eye exhibits no discharge. Left eye exhibits no discharge.  Neck: Normal range of motion. Neck supple.  Cardiovascular: Normal rate, regular rhythm and normal heart sounds.  Exam reveals no gallop and no friction rub.   No murmur heard. Pulmonary/Chest: Effort normal and breath sounds normal. No respiratory distress. She has no wheezes. She has no rales.  Abdominal: Soft. She exhibits no distension. There is no tenderness.  Musculoskeletal:  Normal range of motion. She exhibits no edema or tenderness.  Lymphadenopathy:    She has no cervical adenopathy.  Neurological: She is alert and oriented to person, place, and time.  Skin: Skin is warm and dry.  Psychiatric: She has a normal mood and affect. Her behavior is normal.  Nursing note and vitals reviewed.   ED Course  Procedures (including critical care time) DIAGNOSTIC STUDIES: Oxygen Saturation is 99% on RA, normal by my interpretation.   COORDINATION OF CARE: 12:58 PM- Will prescribe Flonase and advised pt to follow up with PCP. Pt verbalizes understanding and agrees to plan.  Medications - No data to display  Meds given in ED:  Medications - No data to display  Discharge Medication List as of 12/26/2015  1:15 PM     Filed Vitals:   12/26/15 1021 12/26/15 1320  BP: 135/75 109/63  Pulse: 97 57  Temp: 97.8 F (36.6 C)   TempSrc: Oral   Resp: 18 18  SpO2: 99% 100%    MDM  Patient presents for evaluation of URI symptoms that originally were better with Claritin and returned after cessation of Claritin medication. Patients symptoms are consistent with likely allergic etiology, possible viral etiology. Physical exam is unremarkable, vital signs stable and she is afebrile. Discussed that antibiotics are not indicated for viral/allergic infections. Pt will be discharged with symptomatic treatment.  DC with prescription for Flonase. Verbalizes understanding and is agreeable with plan. Pt is hemodynamically stable & in NAD prior to dc.  Final diagnoses:  Mild nasal congestion  Cough   I personally performed the services described in this documentation, which was scribed in my presence. The recorded information has been reviewed and is accurate.     Comer Locket, PA-C 12/26/15 1404  Gareth Morgan, MD 12/27/15 1144

## 2015-12-26 NOTE — Telephone Encounter (Signed)
Patient Name: Anne [name] DOB: [birth year] Initial Comment Caller States feels dizzy, headache, stuffy nose, congestion, runny nose, cough. got flu shot 2 wks ago Nurse Assessment Nurse: Vallery Sa, RN, [place] Date/Time (Eastern Time): 12/26/2015 9:01:32 AM Confirm and document reason for call. If symptomatic, describe symptoms. You must click the next button to save text entered. ---Caller states she developed cough/cold symptoms about 2 weeks ago, a headache about 2-3 days ago and dizziness this morning. No severe breathing difficulty. No injury in the past 3 days. No known fever. Has the patient traveled out of the country within the last 30 days? ---No Does the patient have any new or worsening symptoms? ---Yes Will a triage be completed? ---Yes Related visit to physician within the last 2 weeks? ---No Does the PT have any chronic conditions? (i.e. diabetes, asthma, etc.) ---Yes List chronic conditions. ---High Blood Pressure Is this a behavioral health or substance abuse call? ---No Guidelines Guideline Title Affirmed Question Affirmed Notes Dizziness - Lightheadedness Extra heart beats OR irregular heart beating (i.e., "palpitations") Final Disposition User Go to ED Now (or PCP triage) Vallery Sa, RN, [facility] - ED Disagree/Comply: Comply

## 2015-12-26 NOTE — Telephone Encounter (Signed)
Pt went to ED

## 2015-12-26 NOTE — ED Notes (Signed)
Called x 2. No answer 

## 2016-01-21 ENCOUNTER — Other Ambulatory Visit: Payer: Self-pay | Admitting: Family Medicine

## 2016-02-14 ENCOUNTER — Emergency Department (HOSPITAL_COMMUNITY)
Admission: EM | Admit: 2016-02-14 | Discharge: 2016-02-14 | Disposition: A | Discharge status: Home / Self Care | Payer: Medicare Other | Attending: Emergency Medicine | Admitting: Emergency Medicine

## 2016-02-14 ENCOUNTER — Encounter (HOSPITAL_COMMUNITY): Payer: Self-pay | Admitting: Family Medicine

## 2016-02-14 DIAGNOSIS — M8580 Other specified disorders of bone density and structure, unspecified site: Secondary | ICD-10-CM | POA: Insufficient documentation

## 2016-02-14 DIAGNOSIS — Z7951 Long term (current) use of inhaled steroids: Secondary | ICD-10-CM | POA: Insufficient documentation

## 2016-02-14 DIAGNOSIS — Z8673 Personal history of transient ischemic attack (TIA), and cerebral infarction without residual deficits: Secondary | ICD-10-CM | POA: Insufficient documentation

## 2016-02-14 DIAGNOSIS — Z7982 Long term (current) use of aspirin: Secondary | ICD-10-CM | POA: Insufficient documentation

## 2016-02-14 DIAGNOSIS — Z77098 Contact with and (suspected) exposure to other hazardous, chiefly nonmedicinal, chemicals: Secondary | ICD-10-CM

## 2016-02-14 DIAGNOSIS — I10 Essential (primary) hypertension: Secondary | ICD-10-CM | POA: Diagnosis not present

## 2016-02-14 DIAGNOSIS — H5711 Ocular pain, right eye: Secondary | ICD-10-CM | POA: Diagnosis present

## 2016-02-14 DIAGNOSIS — Z79899 Other long term (current) drug therapy: Secondary | ICD-10-CM | POA: Diagnosis not present

## 2016-02-14 DIAGNOSIS — E785 Hyperlipidemia, unspecified: Secondary | ICD-10-CM | POA: Diagnosis not present

## 2016-02-14 MED ORDER — FLUORESCEIN SODIUM 1 MG OP STRP
1.0000 | ORAL_STRIP | Freq: Once | OPHTHALMIC | Status: AC
  Administered 2016-02-14: 1 via OPHTHALMIC
  Filled 2016-02-14 (×2): qty 1

## 2016-02-14 MED ORDER — ERYTHROMYCIN 5 MG/GM OP OINT
1.0000 "application " | TOPICAL_OINTMENT | Freq: Once | OPHTHALMIC | Status: AC
  Administered 2016-02-14: 1 via OPHTHALMIC
  Filled 2016-02-14: qty 3.5

## 2016-02-14 MED ORDER — TETRACAINE HCL 0.5 % OP SOLN
2.0000 [drp] | Freq: Once | OPHTHALMIC | Status: AC
  Administered 2016-02-14: 2 [drp] via OPHTHALMIC
  Filled 2016-02-14: qty 2

## 2016-02-14 NOTE — ED Notes (Signed)
Pt here for right eye pain. stst that she had something in her eye and went to get dome eye drops. She  though it was eye drops and grabbed the wrong bottle. It is clotrimazole which is not for eye use and the med is 69 years old. Denies vision issues. sts some pain.

## 2016-02-14 NOTE — Discharge Instructions (Signed)
Please read and follow all provided instructions.  Your diagnoses today include:  1. Chemical exposure of eye    Tests performed today include:  Visual acuity testing to check your vision  Fluorescein dye examination to look for scratches on your eye  Vital signs. See below for your results today.   Medications prescribed:   Erythromycin  - antibiotic eye ointment  Use this medication as follows:  Apply 1/4" of the antibiotic ointment to affected eye up to 6 times a day while awake for 7 days  Take any prescribed medications only as directed.  Home care instructions:  Follow any educational materials contained in this packet. If you wear contact lenses, do not use them until your eye caregiver approves. Follow-up care is necessary to be sure the infection is healing if not completely resolved in 2-3 days. See your caregiver or eye specialist as suggested for followup.   If you have an eye infection, wash your hands often as this is very contagious and is easily spread from person to person.   Follow-up instructions: Please follow-up with your primary care doctor or the opthalmologist listed in the next 1-2 days for further evaluation of your symptoms.  Return instructions:   Please return to the Emergency Department if you experience worsening symptoms.   Please return immediately if you develop severe pain, pus drainage, new change in vision, or fever.  Please return if you have any other emergent concerns.  Additional Information:  Your vital signs today were: BP 167/70 mmHg   Pulse 56   Temp(Src) 97.9 F (36.6 C) (Oral)   Resp 15   SpO2 99% If your blood pressure (BP) was elevated above 135/85 this visit, please have this repeated by your doctor within one month. ---------------

## 2016-02-14 NOTE — ED Notes (Signed)
Checked patient eyes she was 20/30 both eyes 20/50 left eye, 20/50 right eye

## 2016-02-14 NOTE — ED Provider Notes (Signed)
CSN: [id number]     Arrival date & time 02/14/16  [id number] History  By signing my name below, I, Anne [name], attest that this documentation has been prepared under the direction and in the presence of [employer]. Electronically Signed: Sonum [name], Education administrator. 02/14/2016. 10:23 AM.    Chief Complaint  Patient presents with  . Eye Problem   The history is provided by the patient. No language interpreter was used.     HPI Comments: Anne [name] is a 70 y.o. female who presents to the Emergency Department complaining of constant right eye pain. Patient states she felt a foreign body in her eye yesterday and accidentally applied clotrimazole drops instead of eye drops this morning about 8:45AM. She reports subsequent burning sensation to the right eye. She states she continues to feel the particle in the right eye. She denies blurry/double vision. No fever, drainage.   Past Medical History  Diagnosis Date  . Hyperlipidemia   . Hypertension   . Stroke [facility]) 09-2007    has mild left leg weakness, saw Dr. Brett Fairy  . Osteopenia     per DEXA on 07-19-09   History reviewed. No pertinent past surgical history. History reviewed. No pertinent family history. Social History  Substance Use Topics  . Smoking status: Never Smoker   . Smokeless tobacco: Never Used  . Alcohol Use: 0.0 oz/week    0 Standard drinks or equivalent per week     Comment: rare   OB History    No data available     Review of Systems  Constitutional: Negative for fever, chills and fatigue.  HENT: Negative for congestion, ear pain, rhinorrhea, sinus pressure and sore throat.   Eyes: Positive for pain and redness. Negative for photophobia, discharge, itching and visual disturbance.  Respiratory: Negative for cough and wheezing.   Gastrointestinal: Negative for nausea, vomiting, abdominal pain and diarrhea.  Genitourinary: Negative for dysuria.  Musculoskeletal: Negative for myalgias and neck stiffness.  Skin:  Negative for rash.  Neurological: Negative for headaches.  Hematological: Negative for adenopathy.    Allergies  Iodine  Home Medications   Prior to Admission medications   Medication Sig Start Date End Date Taking? Authorizing Provider  acetaminophen (TYLENOL) 325 MG tablet Take 650 mg by mouth every 6 (six) hours as needed for mild pain.    Historical Provider, MD  amLODipine (NORVASC) 10 MG tablet TAKE 1 TABLET (10 MG TOTAL) BY MOUTH DAILY. 08/03/15   Laurey Morale, MD  aspirin 81 MG tablet Take 81 mg by mouth daily.    Historical Provider, MD  Calcium-Vitamin D (CALTRATE 600 PLUS-VIT D PO) Take 1 each by mouth daily.      Historical Provider, MD  fluticasone (FLONASE) 50 MCG/ACT nasal spray Place 1 spray into both nostrils daily. 12/26/15   Comer Locket, PA-C  hydrochlorothiazide (HYDRODIURIL) 25 MG tablet TAKE 1 TABLET (25 MG TOTAL) BY MOUTH DAILY. 01/22/16   Laurey Morale, MD  naproxen (NAPROSYN) 375 MG tablet Take 1 tablet (375 mg total) by mouth 2 (two) times daily. 02/07/15   Britt Bottom, NP  nebivolol (BYSTOLIC) 10 MG tablet Take 2 tablets (20 mg total) by mouth daily. 10/18/15   Laurey Morale, MD  potassium chloride SA (K-DUR,KLOR-CON) 20 MEQ tablet TAKE 1 TABLET (20 MEQ TOTAL) BY MOUTH DAILY. 06/06/15   Laurey Morale, MD   BP 167/70 mmHg  Pulse 56  Temp(Src) 97.9 F (36.6 C) (Oral)  Resp 15  SpO2 99%  Physical Exam  Constitutional: She is oriented to person, place, and time. She appears well-developed and well-nourished.  HENT:  Head: Normocephalic and atraumatic.  Eyes: Lids are normal. Pupils are equal, round, and reactive to light. Right eye exhibits no chemosis and no discharge. Left eye exhibits no chemosis and no discharge. Right conjunctiva is injected (mild). Right conjunctiva has no hemorrhage. Left conjunctiva is not injected. Left conjunctiva has no hemorrhage.  Slit lamp exam:      The right eye shows no corneal abrasion, no corneal ulcer, no foreign  body, no fluorescein uptake and no anterior chamber bulge.  Neck: Normal range of motion. Neck supple.  Cardiovascular: Normal rate.   Pulmonary/Chest: Effort normal. No respiratory distress.  Neurological: She is alert and oriented to person, place, and time.  Skin: Skin is warm and dry.  Psychiatric: She has a normal mood and affect.  Nursing note and vitals reviewed.   ED Course  Procedures (including critical care time)  DIAGNOSTIC STUDIES: Oxygen Saturation is 99% on RA, normal by my interpretation.    COORDINATION OF CARE: 10:29 AM Discussed treatment plan with pt at bedside and pt agreed to plan.   Vital signs reviewed and are as follows: Filed Vitals:   02/14/16 1010  BP: 167/70  Pulse: 56  Temp: 97.9 F (36.6 C)  Resp: 15   PH of clotrimazole solution is 5.   Eye was irrigated by nursing. Right eye 20/50, left eye 20/50, both 20/30.   11:27 AM Two drops of tetracaine instilled into affected eye.   Fluorescein strip applied to affected eye. Wood's lamp used to assess for corneal abrasion. No corneal abrasion identified. No foreign bodies noted. No visible hyphema.   Patient tolerated procedure well without immediate complication.    Will d/c with erythromycin drops. Encouraged Ophthalmology follow-up tomorrow if symptoms are not improved today. She may use saline drops as needed as well.   Encourage return to the emergency department with worsening uncontrolled pain, purulent drainage from the eye, vision loss, or other concerns. Patient verbalizes understanding and agrees with plan.   MDM   Final diagnoses:  Chemical exposure of eye   Patient with chemical exposure to the right eye. Visual acuity preserved. No signs of foreign body or corneal abrasion. No surrounding erythema, swelling, vision changes/loss suspicious for orbital or periorbital cellulitis. No signs of iritis. No signs of glaucoma. No symptoms of retinal detachment. No ophthalmologic emergency  suspected. Outpatient referral given in case of no improvement.   I personally performed the services described in this documentation, which was scribed in my presence. The recorded information has been reviewed and is accurate.    Carlisle Cater, PA-C 02/14/16 [place], MD 02/14/16 [id number]

## 2016-03-11 ENCOUNTER — Other Ambulatory Visit: Payer: Self-pay | Admitting: Family Medicine

## 2016-03-12 ENCOUNTER — Other Ambulatory Visit: Payer: Self-pay | Admitting: General Practice

## 2016-03-12 MED ORDER — NEBIVOLOL HCL 10 MG PO TABS: 20.0000 mg | ORAL_TABLET | Freq: Every day | ORAL | Status: DC

## 2016-03-13 ENCOUNTER — Other Ambulatory Visit: Payer: Self-pay | Admitting: Family Medicine

## 2016-04-26 ENCOUNTER — Other Ambulatory Visit: Payer: Self-pay | Admitting: Family Medicine

## 2016-05-04 ENCOUNTER — Other Ambulatory Visit: Payer: Self-pay | Admitting: Family Medicine

## 2016-05-26 ENCOUNTER — Other Ambulatory Visit: Payer: Self-pay | Admitting: Family Medicine

## 2016-06-29 ENCOUNTER — Other Ambulatory Visit: Payer: Self-pay | Admitting: Family Medicine

## 2016-07-04 ENCOUNTER — Other Ambulatory Visit: Payer: Self-pay | Admitting: Family Medicine

## 2016-07-12 ENCOUNTER — Encounter: Payer: Self-pay | Admitting: Family Medicine

## 2016-07-12 ENCOUNTER — Ambulatory Visit (INDEPENDENT_AMBULATORY_CARE_PROVIDER_SITE_OTHER): Payer: Medicare Other | Admitting: Family Medicine

## 2016-07-12 VITALS — BP 176/92 | HR 59 | Temp 98.1°F | Ht 60.0 in | Wt 166.0 lb

## 2016-07-12 DIAGNOSIS — L989 Disorder of the skin and subcutaneous tissue, unspecified: Secondary | ICD-10-CM

## 2016-07-12 DIAGNOSIS — J209 Acute bronchitis, unspecified: Secondary | ICD-10-CM

## 2016-07-12 MED ORDER — AZITHROMYCIN 250 MG PO TABS: ORAL_TABLET | ORAL | 0 refills | Status: DC

## 2016-07-12 NOTE — Progress Notes (Signed)
Pre visit review using our clinic review tool, if applicable. No additional management support is needed unless otherwise documented below in the visit note. 

## 2016-07-12 NOTE — Progress Notes (Signed)
   Subjective:    Patient ID: Anne [name], female    DOB: 01/15/1946, 70 y.o.   MRN: [id number]  HPI Here for 2 things. First for the past 2 months she has had an irritated lesion on the left upper arm at the site of a previous flu shot. The site gets red and itchy. Second she has been coughing for the past 5 days. The cough is dry. No ST but she does get PND.    Review of Systems  Constitutional: Negative.   HENT: Positive for postnasal drip. Negative for sinus pressure and sore throat.   Eyes: Negative.   Respiratory: Positive for cough.   Cardiovascular: Negative.   Skin: Positive for wound.       Objective:   Physical Exam  Constitutional: She is oriented to person, place, and time. She appears well-developed and well-nourished.  Neck: No thyromegaly present.  Cardiovascular: Normal rate, regular rhythm, normal heart sounds and intact distal pulses.   Pulmonary/Chest: Effort normal and breath sounds normal. No respiratory distress. She has no wheezes. She has no rales.  Lymphadenopathy:    She has no cervical adenopathy.  Neurological: She is alert and oriented to person, place, and time.  Skin:  left upper arm has a macular lesion surrounded by erythema          Assessment & Plan:  She has an inflamed skin lesion which appears to be a dermatofibroma. Refer to Dermatology. She also has bronchitis, treat with a Zpack.  Laurey Morale, MD

## 2016-10-02 DIAGNOSIS — D485 Neoplasm of uncertain behavior of skin: Secondary | ICD-10-CM | POA: Diagnosis not present

## 2016-10-02 DIAGNOSIS — D2362 Other benign neoplasm of skin of left upper limb, including shoulder: Secondary | ICD-10-CM | POA: Diagnosis not present

## 2016-10-13 ENCOUNTER — Other Ambulatory Visit: Payer: Self-pay | Admitting: Family Medicine

## 2016-10-18 ENCOUNTER — Ambulatory Visit (INDEPENDENT_AMBULATORY_CARE_PROVIDER_SITE_OTHER): Payer: Medicare Other

## 2016-10-18 DIAGNOSIS — Z23 Encounter for immunization: Secondary | ICD-10-CM

## 2016-10-18 NOTE — Progress Notes (Signed)
Patient had a high dose of flu injection 10/18/2016 at 12.32pm. Given by Rosealee Albee CMA.

## 2016-11-14 ENCOUNTER — Other Ambulatory Visit: Payer: Self-pay | Admitting: Internal Medicine

## 2016-11-14 NOTE — Telephone Encounter (Signed)
Pt would like a refill. She has not seen dr fry since 2016. Please advise.

## 2016-11-22 ENCOUNTER — Other Ambulatory Visit: Payer: Self-pay | Admitting: Family Medicine

## 2016-11-22 NOTE — Telephone Encounter (Signed)
Can we refill this? 

## 2016-11-30 ENCOUNTER — Other Ambulatory Visit: Payer: Self-pay | Admitting: Family Medicine

## 2017-01-09 ENCOUNTER — Telehealth: Payer: Self-pay

## 2017-01-09 NOTE — Telephone Encounter (Signed)
Patient called to c/o new onset of rash with itching/burning on her face. She reports rash on both cheeks, chin, forehead and nose that has been there for several days. She did try some new facial lotion and had also applied some old make-up. She is not sure which one may have caused the rash. She has tried several lotions with no improvement. She has not tried any OTC itch medications such as hydrocortisone. Advised pt to apply hydrocortisone 2-3 times daily along with a fragrance/dye free emollient such as Aquaphor or Crisco. Pt will try these and call office if not improving. Nothing further needed at this time.

## 2017-02-26 ENCOUNTER — Ambulatory Visit: Payer: Medicare Other | Admitting: Family Medicine

## 2017-02-26 ENCOUNTER — Encounter: Payer: Self-pay | Admitting: Family Medicine

## 2017-02-26 ENCOUNTER — Ambulatory Visit (INDEPENDENT_AMBULATORY_CARE_PROVIDER_SITE_OTHER): Payer: Medicare Other | Admitting: Family Medicine

## 2017-02-26 VITALS — BP 156/85 | HR 50 | Temp 98.5°F | Ht 60.0 in | Wt 164.0 lb

## 2017-02-26 DIAGNOSIS — J209 Acute bronchitis, unspecified: Secondary | ICD-10-CM

## 2017-02-26 MED ORDER — AZITHROMYCIN 250 MG PO TABS: ORAL_TABLET | ORAL | 0 refills | Status: DC

## 2017-02-26 NOTE — Patient Instructions (Signed)
WE NOW OFFER   [facility] FAST TRACK!!!  SAME DAY Appointments for ACUTE CARE  Such as: Sprains, Injuries, cuts, abrasions, rashes, muscle pain, joint pain, back pain Colds, flu, sore throats, headache, allergies, cough, fever  Ear pain, sinus and eye infections Abdominal pain, nausea, vomiting, diarrhea, upset stomach Animal/insect bites  3 Easy Ways to Schedule: Walk-In Scheduling Call in scheduling Mychart Sign-up: [email or web address]         

## 2017-02-26 NOTE — Progress Notes (Signed)
   Subjective:    Patient ID: Anne [name], female    DOB: [birth year], 71 y.o.   MRN: [id number]  HPI Here for 4 days of chest congestion and a dry cough. No fever.    Review of Systems  Constitutional: Negative.   HENT: Positive for congestion. Negative for sinus pain, sinus pressure and sore throat.   Eyes: Negative.   Respiratory: Positive for cough.        Objective:   Physical Exam  Constitutional: She appears well-developed and well-nourished.  HENT:  Right Ear: External ear normal.  Left Ear: External ear normal.  Nose: Nose normal.  Mouth/Throat: Oropharynx is clear and moist.  Eyes: Conjunctivae are normal.  Neck: No thyromegaly present.  Pulmonary/Chest: Effort normal and breath sounds normal. No respiratory distress. She has no wheezes. She has no rales.  Lymphadenopathy:    She has no cervical adenopathy.          Assessment & Plan:  Bronchitis, treat with a Zpack. Add Delsym prn.  Alysia Penna, MD

## 2017-03-24 ENCOUNTER — Other Ambulatory Visit: Payer: Self-pay | Admitting: Family Medicine

## 2017-04-22 ENCOUNTER — Encounter: Payer: Self-pay | Admitting: Family Medicine

## 2017-04-22 ENCOUNTER — Ambulatory Visit (INDEPENDENT_AMBULATORY_CARE_PROVIDER_SITE_OTHER): Payer: Medicare Other | Admitting: Family Medicine

## 2017-04-22 ENCOUNTER — Telehealth: Payer: Self-pay | Admitting: Family Medicine

## 2017-04-22 VITALS — BP 136/70 | Temp 98.2°F | Ht 60.0 in | Wt 165.0 lb

## 2017-04-22 DIAGNOSIS — N3000 Acute cystitis without hematuria: Secondary | ICD-10-CM

## 2017-04-22 LAB — POC URINALSYSI DIPSTICK (AUTOMATED)
BILIRUBIN UA: NEGATIVE
Glucose, UA: NEGATIVE
KETONES UA: NEGATIVE
LEUKOCYTES UA: NEGATIVE
Nitrite, UA: NEGATIVE
PH UA: 7 (ref 5.0–8.0)
Protein, UA: NEGATIVE
RBC UA: NEGATIVE
SPEC GRAV UA: 1.01 (ref 1.010–1.025)
Urobilinogen, UA: 0.2 E.U./dL

## 2017-04-22 MED ORDER — CIPROFLOXACIN HCL 500 MG PO TABS: 500.0000 mg | ORAL_TABLET | Freq: Two times a day (BID) | ORAL | 0 refills | Status: DC

## 2017-04-22 NOTE — Telephone Encounter (Signed)
Pt scheduled to see Dr. Sarajane Jews today at 2pm. Nothing further needed.

## 2017-04-22 NOTE — Addendum Note (Signed)
Addended by: Aggie Hacker A on: 04/22/2017 03:06 PM   Modules accepted: Orders

## 2017-04-22 NOTE — Patient Instructions (Signed)
WE NOW OFFER   [facility] FAST TRACK!!!  SAME DAY Appointments for ACUTE CARE  Such as: Sprains, Injuries, cuts, abrasions, rashes, muscle pain, joint pain, back pain Colds, flu, sore throats, headache, allergies, cough, fever  Ear pain, sinus and eye infections Abdominal pain, nausea, vomiting, diarrhea, upset stomach Animal/insect bites  3 Easy Ways to Schedule: Walk-In Scheduling Call in scheduling Mychart Sign-up: [email or web address]         

## 2017-04-22 NOTE — Progress Notes (Signed)
   Subjective:    Patient ID: CHARLI HALLE, female    DOB: 1946/05/07, 71 y.o.   MRN: [id number]  HPI Here for 3 weeks of increased urgency and frequency of urination. No fever. BMs are normal.    Review of Systems  Constitutional: Negative.   Respiratory: Negative.   Cardiovascular: Negative.   Gastrointestinal: Positive for abdominal pain. Negative for abdominal distention, anal bleeding, blood in stool, constipation, diarrhea, nausea, rectal pain and vomiting.  Genitourinary: Positive for frequency and urgency. Negative for dysuria and flank pain.       Objective:   Physical Exam  Constitutional: She appears well-developed and well-nourished. No distress.  Cardiovascular: Normal rate, regular rhythm, normal heart sounds and intact distal pulses.   Pulmonary/Chest: Effort normal and breath sounds normal. No respiratory distress. She has no wheezes. She has no rales.  Abdominal: Soft. Bowel sounds are normal. She exhibits no distension and no mass. There is no tenderness. There is no rebound and no guarding.          Assessment & Plan:  Probable UTI. Treat with Cipro. Drink plenty of water. Culture the sample.  Alysia Penna, MD

## 2017-04-22 NOTE — Telephone Encounter (Signed)
Patient Name: Anne [name]  DOB: [birth year]    Initial Comment Caller states thinks she has a UTI, has the urg to urinate often   Nurse Assessment  Nurse: Leilani Merl, RN, Heather Date/Time (Eastern Time): 04/22/2017 10:03:49 AM  Confirm and document reason for call. If symptomatic, describe symptoms. ---Caller states thinks she has a UTI, has the urg to urinate often that started about 2 weeks ago  Does the patient have any new or worsening symptoms? ---Yes  Will a triage be completed? ---Yes  Related visit to physician within the last 2 weeks? ---No  Does the PT have any chronic conditions? (i.e. diabetes, asthma, etc.) ---Yes  List chronic conditions. ---See MR  Is this a behavioral health or substance abuse call? ---No     Guidelines    Guideline Title Affirmed Question Affirmed Notes  Urinary Symptoms Urinating more frequently than usual (i.e., frequency)    Final Disposition User   See Physician within 24 Hours Standifer, RN, Water quality scientist    Referrals  REFERRED TO PCP OFFICE   Disagree/Comply: Comply

## 2017-04-23 LAB — URINE CULTURE: ORGANISM ID, BACTERIA: NO GROWTH

## 2017-06-02 ENCOUNTER — Other Ambulatory Visit: Payer: Self-pay | Admitting: Family Medicine

## 2017-06-15 ENCOUNTER — Emergency Department (HOSPITAL_COMMUNITY): Payer: Medicare Other

## 2017-06-15 ENCOUNTER — Emergency Department (HOSPITAL_COMMUNITY)
Admission: EM | Admit: 2017-06-15 | Discharge: 2017-06-15 | Disposition: A | Discharge status: Home / Self Care | Payer: Medicare Other | Attending: Emergency Medicine | Admitting: Emergency Medicine

## 2017-06-15 ENCOUNTER — Encounter (HOSPITAL_COMMUNITY): Payer: Self-pay

## 2017-06-15 DIAGNOSIS — I10 Essential (primary) hypertension: Secondary | ICD-10-CM | POA: Diagnosis not present

## 2017-06-15 DIAGNOSIS — Z8673 Personal history of transient ischemic attack (TIA), and cerebral infarction without residual deficits: Secondary | ICD-10-CM | POA: Insufficient documentation

## 2017-06-15 DIAGNOSIS — Z79899 Other long term (current) drug therapy: Secondary | ICD-10-CM | POA: Diagnosis not present

## 2017-06-15 DIAGNOSIS — Z7982 Long term (current) use of aspirin: Secondary | ICD-10-CM | POA: Diagnosis not present

## 2017-06-15 DIAGNOSIS — R42 Dizziness and giddiness: Secondary | ICD-10-CM | POA: Diagnosis not present

## 2017-06-15 LAB — CBC
HCT: 41.2 % (ref 36.0–46.0)
Hemoglobin: 13.9 g/dL (ref 12.0–15.0)
MCH: 29.9 pg (ref 26.0–34.0)
MCHC: 33.7 g/dL (ref 30.0–36.0)
MCV: 88.6 fL (ref 78.0–100.0)
PLATELETS: 308 10*3/uL (ref 150–400)
RBC: 4.65 MIL/uL (ref 3.87–5.11)
RDW: 13 % (ref 11.5–15.5)
WBC: 12 10*3/uL — AB (ref 4.0–10.5)

## 2017-06-15 LAB — BASIC METABOLIC PANEL
Anion gap: 11 (ref 5–15)
BUN: 9 mg/dL (ref 6–20)
CHLORIDE: 98 mmol/L — AB (ref 101–111)
CO2: 26 mmol/L (ref 22–32)
CREATININE: 0.71 mg/dL (ref 0.44–1.00)
Calcium: 9.1 mg/dL (ref 8.9–10.3)
GFR calc Af Amer: >60 mL/min (ref >60)
GFR calc non Af Amer: >60 mL/min (ref >60)
GLUCOSE: 123 mg/dL — AB (ref 65–99)
Potassium: 3.4 mmol/L — ABNORMAL LOW (ref 3.5–5.1)
SODIUM: 135 mmol/L (ref 135–145)

## 2017-06-15 MED ORDER — MECLIZINE HCL 25 MG PO TABS
25.0000 mg | ORAL_TABLET | Freq: Once | ORAL | Status: AC
  Administered 2017-06-15: 25 mg via ORAL
  Filled 2017-06-15: qty 1

## 2017-06-15 MED ORDER — ONDANSETRON 4 MG PO TBDP
8.0000 mg | ORAL_TABLET | Freq: Once | ORAL | Status: AC
  Administered 2017-06-15: 8 mg via ORAL
  Filled 2017-06-15: qty 2

## 2017-06-15 MED ORDER — MECLIZINE HCL 25 MG PO TABS: 25.0000 mg | ORAL_TABLET | Freq: Three times a day (TID) | ORAL | 0 refills | Status: DC | PRN

## 2017-06-15 NOTE — ED Notes (Signed)
Patient transported to MRI 

## 2017-06-15 NOTE — ED Notes (Signed)
Pt back from MRI 

## 2017-06-15 NOTE — ED Triage Notes (Signed)
Patient complains of dizziness that started when she awoke this am. Reports nausea and 1 epiosde of emesis on way to ED. denies CP, no shortness of breath. Alert and oriented

## 2017-06-15 NOTE — ED Provider Notes (Signed)
[facility] DEPT Provider Note   CSN: [id number] Arrival date & time: 06/15/17  1158     History   Chief Complaint Chief Complaint  Patient presents with  . Dizziness    HPI Anne [name] is a 71 y.o. female.  71 year old female presents with acute onset of dizziness as well as nausea and vomiting. Slight ataxia. Does have a prior history of stroke. No fever or neck pain or photophobia. No recent medication changes. Symptoms started this morning and have been progressive. Denies any associated abdominal discomfort. No recent ear pain. Symptoms better with remaining still and no treatment use prior to arrival.      Past Medical History:  Diagnosis Date  . Hyperlipidemia   . Hypertension   . Osteopenia    per DEXA on 07-19-09  . Stroke [facility]) 09-2007   has mild left leg weakness, saw Dr. Brett Fairy    Patient Active Problem List   Diagnosis Date Noted  . Mallet deformity of fourth finger, right 02/02/2014  . Otitis externa 06/24/2012  . Malignant hypertension 03/10/2012  . ACUTE BRONCHITIS 09/14/2010  . ALLERGIC RHINITIS 05/08/2010  . DIZZINESS 02/01/2010  . LIPOMA 08/11/2009  . OSTEOPENIA 08/11/2009  . SUPRAVENTRICULAR PREMATURE BEATS 07/28/2008  . FATIGUE 06/16/2008  . MYOSITIS 05/12/2008  . CERVICAL STRAIN 05/12/2008  . OTITIS EXTERNA 04/15/2008  . CERUMEN IMPACTION 04/07/2008  . HYPERLIPIDEMIA 11/24/2007  . HYPERTENSION 11/24/2007  . CEREBROVASCULAR ACCIDENT, HX OF 11/24/2007    History reviewed. No pertinent surgical history.  OB History    No data available       Home Medications    Prior to Admission medications   Medication Sig Start Date End Date Taking? Authorizing Provider  acetaminophen (TYLENOL) 325 MG tablet Take 650 mg by mouth every 6 (six) hours as needed for mild pain.    [provider]  amLODipine (NORVASC) 10 MG tablet TAKE 1 TABLET (10 MG TOTAL) BY MOUTH DAILY. 06/03/17   Laurey Morale, MD  aspirin 81 MG tablet Take 81  mg by mouth daily.    [provider]  BYSTOLIC 10 MG tablet TAKE 2 TABLETS (20 MG TOTAL) BY MOUTH DAILY. Patient not taking: Reported on 04/22/2017 03/24/17   Laurey Morale, MD  Calcium-Vitamin D (CALTRATE 600 PLUS-VIT D PO) Take 1 each by mouth daily.      [provider]  ciprofloxacin (CIPRO) 500 MG tablet Take 1 tablet (500 mg total) by mouth 2 (two) times daily. 04/22/17   Laurey Morale, MD  fluticasone (FLONASE) 50 MCG/ACT nasal spray Place 1 spray into both nostrils daily. 12/26/15   Cartner, Marland Kitchen, PA-C  hydrochlorothiazide (HYDRODIURIL) 25 MG tablet TAKE 1 TABLET (25 MG TOTAL) BY MOUTH DAILY. 10/15/16   Laurey Morale, MD  KLOR-CON M20 20 MEQ tablet TAKE 1 TABLET (20 MEQ TOTAL) BY MOUTH DAILY. 11/22/16   Laurey Morale, MD  naproxen (NAPROSYN) 375 MG tablet Take 1 tablet (375 mg total) by mouth 2 (two) times daily. 02/07/15   Britt Bottom, NP    Family History No family history on file.  Social History Social History  Substance Use Topics  . Smoking status: Never Smoker  . Smokeless tobacco: Never Used  . Alcohol use 0.0 oz/week     Comment: rare     Allergies   Iodine   Review of Systems Review of Systems  All other systems reviewed and are negative.    Physical Exam Updated Vital Signs BP (!) 157/90  Pulse (!) 39   Temp 98 F (36.7 C) (Oral)   Resp (!) 21   SpO2 97%   Physical Exam  Constitutional: She is oriented to person, place, and time. She appears well-developed and well-nourished.  Non-toxic appearance. No distress.  HENT:  Head: Normocephalic and atraumatic.  Eyes: Pupils are equal, round, and reactive to light. Conjunctivae, EOM and lids are normal.  Neck: Normal range of motion. Neck supple. No tracheal deviation present. No thyroid mass present.  Cardiovascular: Normal rate, regular rhythm and normal heart sounds.  Exam reveals no gallop.   No murmur heard. Pulmonary/Chest: Effort normal and breath sounds normal. No  stridor. No respiratory distress. She has no decreased breath sounds. She has no wheezes. She has no rhonchi. She has no rales.  Abdominal: Soft. Normal appearance and bowel sounds are normal. She exhibits no distension. There is no tenderness. There is no rebound and no CVA tenderness.  Musculoskeletal: Normal range of motion. She exhibits no edema or tenderness.  Neurological: She is alert and oriented to person, place, and time. She has normal strength. No cranial nerve deficit or sensory deficit. She displays a negative Romberg sign. Gait normal. GCS eye subscore is 4. GCS verbal subscore is 5. GCS motor subscore is 6.  No vertical nystagmus noted. Does have horizontal nystagmus.  Skin: Skin is warm and dry. No abrasion and no rash noted.  Psychiatric: She has a normal mood and affect. Her speech is normal and behavior is normal.  Nursing note and vitals reviewed.    ED Treatments / Results  Labs (all labs ordered are listed, but only abnormal results are displayed) Labs Reviewed  BASIC METABOLIC PANEL - Abnormal; Notable for the following:       Result Value   Potassium 3.4 (*)    Chloride 98 (*)    Glucose, Bld 123 (*)    All other components within normal limits  CBC - Abnormal; Notable for the following:    WBC 12.0 (*)    All other components within normal limits  URINALYSIS, ROUTINE W REFLEX MICROSCOPIC  CBG MONITORING, ED    EKG  EKG Interpretation None       Radiology No results found.  Procedures Procedures (including critical care time)  Medications Ordered in ED Medications - No data to display   Initial Impression / Assessment and Plan / ED Course  I have reviewed the triage vital signs and the nursing notes.  Pertinent labs & imaging results that were available during my care of the patient were reviewed by me and considered in my medical decision making (see chart for details).     Patient with dizziness and slight off balance with walking concern  for central vertigo. MRI of brain without acute findings. We'll treat with Antivert and return precautions given  Final Clinical Impressions(s) / ED Diagnoses   Final diagnoses:  None    New Prescriptions New Prescriptions   No medications on file     Lacretia Leigh, MD 06/15/17 1607

## 2017-06-17 ENCOUNTER — Encounter: Payer: Self-pay | Admitting: Family Medicine

## 2017-06-17 ENCOUNTER — Ambulatory Visit (INDEPENDENT_AMBULATORY_CARE_PROVIDER_SITE_OTHER): Payer: Medicare Other | Admitting: Family Medicine

## 2017-06-17 VITALS — BP 148/70 | Temp 98.0°F | Ht 60.0 in | Wt 163.0 lb

## 2017-06-17 DIAGNOSIS — Z8679 Personal history of other diseases of the circulatory system: Secondary | ICD-10-CM | POA: Diagnosis not present

## 2017-06-17 DIAGNOSIS — I10 Essential (primary) hypertension: Secondary | ICD-10-CM | POA: Diagnosis not present

## 2017-06-17 DIAGNOSIS — R42 Dizziness and giddiness: Secondary | ICD-10-CM

## 2017-06-17 NOTE — Patient Instructions (Signed)
WE NOW OFFER   [name] FAST TRACK!!!  SAME DAY Appointments for ACUTE CARE  Such as: Sprains, Injuries, cuts, abrasions, rashes, muscle pain, joint pain, back pain Colds, flu, sore throats, headache, allergies, cough, fever  Ear pain, sinus and eye infections Abdominal pain, nausea, vomiting, diarrhea, upset stomach Animal/insect bites  3 Easy Ways to Schedule: Walk-In Scheduling Call in scheduling Mychart Sign-up: [email or web address]         

## 2017-06-17 NOTE — Progress Notes (Signed)
   Subjective:    Patient ID: Anne [name], female    DOB: 20-May-1946, 71 y.o.   MRN: [id number]  HPI Here to follow up an ER visit on 06-15-17 for the sudden onset of dizziness, mild headache, and nasuea with vomiting. She woke up that morning and when she sat up on the edge of the bed, the dizziness started immediately. She ways it felt like the room was spinning. She was able to drive herself to the ER. Once there her exam was normal except for some horizontal nystagmus. Labs were unremarkable. A brain MRI showed some old right sided lacunar infarcts in the thalamus and basal ganglia, but no acute findings. She was felt to have vertigo and she was given some fluids. They gave her a rx for Meclizine but she never had this filled. Since then she has felt much better, although she still has mild dizziness for a few seconds if she moves her head quickly. No more N and V, no headache.    Review of Systems  Constitutional: Negative.   HENT: Negative for congestion, sinus pain, sinus pressure and tinnitus.   Eyes: Negative.   Respiratory: Negative.   Cardiovascular: Negative.   Neurological: Positive for dizziness. Negative for speech difficulty, weakness, numbness and headaches.       Objective:   Physical Exam  Constitutional: She is oriented to person, place, and time. She appears well-developed and well-nourished. No distress.  HENT:  Right Ear: External ear normal.  Left Ear: External ear normal.  Nose: Nose normal.  Mouth/Throat: Oropharynx is clear and moist.  Eyes: Pupils are equal, round, and reactive to light. Conjunctivae and EOM are normal.  No nystagmus   Neck: Neck supple. No thyromegaly present.  Cardiovascular: Normal rate, regular rhythm, normal heart sounds and intact distal pulses.   Pulmonary/Chest: Effort normal and breath sounds normal. No respiratory distress. She has no wheezes. She has no rales.  Lymphadenopathy:    She has no cervical adenopathy.  Neurological:  She is alert and oriented to person, place, and time. No cranial nerve deficit. She exhibits normal muscle tone. Coordination normal.          Assessment & Plan:  Vertigo, somewhat improved. Advised her to drink plenty of fluids, stay out of the heat, take a Claritin daily. She has Meclizine to use prn.  Alysia Penna, MD

## 2017-08-08 ENCOUNTER — Other Ambulatory Visit: Payer: Self-pay | Admitting: Family Medicine

## 2017-09-18 ENCOUNTER — Other Ambulatory Visit: Payer: Self-pay | Admitting: Family Medicine

## 2017-10-08 ENCOUNTER — Other Ambulatory Visit: Payer: Self-pay | Admitting: Family Medicine

## 2017-10-09 NOTE — Telephone Encounter (Signed)
Last OV 06/17/2017. Rx was last refilled 10/15/2016 for 90 day supply with 2 refills.

## 2017-10-13 ENCOUNTER — Ambulatory Visit: Payer: Medicare Other

## 2017-10-15 ENCOUNTER — Ambulatory Visit: Payer: Medicare Other

## 2017-10-23 ENCOUNTER — Ambulatory Visit (INDEPENDENT_AMBULATORY_CARE_PROVIDER_SITE_OTHER): Payer: Medicare Other

## 2017-10-23 DIAGNOSIS — Z23 Encounter for immunization: Secondary | ICD-10-CM

## 2017-11-11 ENCOUNTER — Other Ambulatory Visit: Payer: Self-pay | Admitting: Family Medicine

## 2017-12-01 ENCOUNTER — Other Ambulatory Visit: Payer: Self-pay | Admitting: Family Medicine

## 2017-12-23 ENCOUNTER — Ambulatory Visit: Payer: Self-pay | Admitting: *Deleted

## 2017-12-23 ENCOUNTER — Ambulatory Visit (INDEPENDENT_AMBULATORY_CARE_PROVIDER_SITE_OTHER): Payer: Medicare Other | Admitting: Family Medicine

## 2017-12-23 ENCOUNTER — Encounter: Payer: Self-pay | Admitting: Family Medicine

## 2017-12-23 VITALS — BP 110/58 | HR 50 | Temp 98.1°F | Ht 60.0 in | Wt 169.5 lb

## 2017-12-23 DIAGNOSIS — R001 Bradycardia, unspecified: Secondary | ICD-10-CM | POA: Diagnosis not present

## 2017-12-23 DIAGNOSIS — I10 Essential (primary) hypertension: Secondary | ICD-10-CM | POA: Diagnosis not present

## 2017-12-23 NOTE — Telephone Encounter (Signed)
Pt called because her pulse is 50 today at 0830; her blood pressure was 172/80ish; she states that yesterday her pule was 48; bot readings were taken with automatic BP cuff; nurse triage initiated to and recommendations made per protocol; pt requests appointment with Dr Sarajane Jews; spoke with Sunday Spillers and OK given to make appointment for office visit; Dr Sarajane Jews has no appointments available; pt offered and accepted appointment with with Dr Maudie Mercury, [facility]ver, 12/23/17 at 1530; pt verbalizes understanding; will route to office for notification of this encounter and upcoming appointment.; if needed the pt can be contacted at [phone].  Reason for Disposition . Age > 60 years (Exception: brief heart beat symptoms that went away and now feels well)  Answer Assessment - Initial Assessment Questions 1. DESCRIPTION: "Please describe your heart rate or heart beat that you are having" (e.g., fast/slow, regular/irregular, skipped or extra beats, "palpitations")     Slow HR 50 this morning; she reports it was 48 yesterday 2. ONSET: "When did it start?" (Minutes, hours or days)      This morning 3. DURATION: "How long does it last" (e.g., seconds, minutes, hours)     Unsure; has not checked 4. PATTERN "Does it come and go, or has it been constant since it started?"  "Does it get worse with exertion?"   "Are you feeling it now?"     Feels fine 5. TAP: "Using your hand, can you tap out what you are feeling on a chair or table in front of you, so that I can hear?" (Note: not all patients can do this)       Pt can not complete this task 6. HEART RATE: "Can you tell me your heart rate?" "How many beats in 15 seconds?"  (Note: not all patients can do this)       Pt can not complete this tast 7. RECURRENT SYMPTOM: "Have you ever had this before?" If so, ask: "When was the last time?" and "What happened that time?"      Unsure  8. CAUSE: "What do you think is causing the palpitations?"     Unsure ? BP medciation 9. CARDIAC  HISTORY: "Do you have any history of heart disease?" (e.g., heart attack, angina, bypass surgery, angioplasty, arrhythmia)      High blood pressure, TIA 10. OTHER SYMPTOMS: "Do you have any other symptoms?" (e.g., dizziness, chest pain, sweating, difficulty breathing)      Light headed at times; slight headche at times; hard time remembering things 11. PREGNANCY: "Is there any chance you are pregnant?" "When was your last menstrual period?"       no  Protocols used: HEART RATE AND HEARTBEAT QUESTIONS-A-AH

## 2017-12-23 NOTE — Patient Instructions (Signed)
BEFORE YOU LEAVE: -follow up: 2 weeks  Please decrease the bystolic to 15 mg (1.5 tablets daily) instead of 2 tablets.  I hope you are feeling better soon! Seek care sooner if new concerns arise.

## 2017-12-23 NOTE — Telephone Encounter (Signed)
Dr Fry - FYI. Thanks! 

## 2017-12-23 NOTE — Progress Notes (Signed)
HPI:  Acute visit for low heart rate.  She has started checking her heart rate and it was 50 yesterday and 48 today.  2 years ago when she did a cardiology evaluation for free screening it was 62.  She has not had dizziness recently, chest pain, palpitations, shortness of breath.  She does feel "sluggish" at times chronically, but she feels this is may be due to poor sleep.  She is concerned about the Bystolic as a nurse told her this could be causing the low heart rate.  Reports she was placed on this medicine a long time ago.  She cannot remember why.  ROS: See pertinent positives and negatives per HPI.  Past Medical History:  Diagnosis Date  . Hyperlipidemia   . Hypertension   . Osteopenia    per DEXA on 07-19-09  . Stroke [facility]) 09-2007   has mild left leg weakness, saw Dr. Brett Fairy    History reviewed. No pertinent surgical history.  History reviewed. No pertinent family history.  Social History   Socioeconomic History  . Marital status: Single    Spouse name: None  . Number of children: None  . Years of education: None  . Highest education level: None  Social Needs  . Financial resource strain: None  . Food insecurity - worry: None  . Food insecurity - inability: None  . Transportation needs - medical: None  . Transportation needs - non-medical: None  Occupational History  . None  Tobacco Use  . Smoking status: Never Smoker  . Smokeless tobacco: Never Used  Substance and Sexual Activity  . Alcohol use: Yes    Alcohol/week: 0.0 oz    Comment: rare  . Drug use: No  . Sexual activity: None  Other Topics Concern  . None  Social History Narrative  . None     Current Outpatient Medications:  .  acetaminophen (TYLENOL) 325 MG tablet, Take 650 mg by mouth every 6 (six) hours as needed for mild pain., Disp: , Rfl:  .  amLODipine (NORVASC) 10 MG tablet, TAKE 1 TABLET (10 MG TOTAL) BY MOUTH DAILY., Disp: 90 tablet, Rfl: 2 .  aspirin 81 MG tablet, Take 81 mg by mouth  daily., Disp: , Rfl:  .  BYSTOLIC 10 MG tablet, TAKE 2 TABLETS (20 MG TOTAL) BY MOUTH DAILY., Disp: 60 tablet, Rfl: 2 .  Calcium-Vitamin D (CALTRATE 600 PLUS-VIT D PO), Take 1 tablet by mouth daily. , Disp: , Rfl:  .  hydrochlorothiazide (HYDRODIURIL) 25 MG tablet, TAKE 1 TABLET (25 MG TOTAL) BY MOUTH DAILY., Disp: 90 tablet, Rfl: 2 .  KLOR-CON M20 20 MEQ tablet, TAKE 1 TABLET (20 MEQ TOTAL) BY MOUTH DAILY., Disp: 90 tablet, Rfl: 2 .  meclizine (ANTIVERT) 25 MG tablet, Take 1 tablet (25 mg total) by mouth 3 (three) times daily as needed for dizziness., Disp: 30 tablet, Rfl: 0 .  naproxen (NAPROSYN) 375 MG tablet, Take 1 tablet (375 mg total) by mouth 2 (two) times daily., Disp: 20 tablet, Rfl: 0  EXAM:  Vitals:   12/23/17 1638  BP: (!) 110/58  Pulse: (!) 50  Temp: 98.1 F (36.7 C)  SpO2: 98%    Body mass index is 33.1 kg/m.  GENERAL: vitals reviewed and listed above, alert, oriented, appears well hydrated and in no acute distress  HEENT: atraumatic, conjunttiva clear, no obvious abnormalities on inspection of external nose and ears  NECK: no obvious masses on inspection  LUNGS: clear to auscultation bilaterally, no wheezes, rales  or rhonchi, good air movement  CV: Bradycardia, no peripheral edema  MS: moves all extremities without noticeable abnormality  PSYCH: pleasant and cooperative, no obvious depression or anxiety  ASSESSMENT AND PLAN:  Discussed the following assessment and plan:  Bradycardia  Essential hypertension  -It looks like her lower heart rate is not a new issue and has been seen on a number of office visits, emergency room visits and on EKGs in the past on brief review of the chart, it seems she is asymptomatic -The bystolic certainly could be contributing and we talked about tapering off of this since her blood pressure is on the low end -She is worried about this, as she wants to make sure that her blood pressure remains under good control -she reports  a history of stroke, there is a listed history of supraventricular premature beats -Opted to taper the systolic very gradually under close supervision with her PCP -she reports taking 2 tablets, 20 mg daily currently.  Will decrease to 15 mg.  Advised follow-up with PCP in 2 weeks. -Of course, we recommended that she follow-up sooner if any symptoms or new concerns  Patient Instructions  BEFORE YOU LEAVE: -follow up: 2 weeks  Please decrease the bystolic to 15 mg (1.5 tablets daily) instead of 2 tablets.  I hope you are feeling better soon! Seek care sooner if new concerns arise.      Anne Kern, DO

## 2017-12-31 ENCOUNTER — Ambulatory Visit: Payer: Self-pay | Admitting: *Deleted

## 2017-12-31 NOTE — Telephone Encounter (Signed)
Pt calling stating she checked her BP which was 150/66 and P 45. Pt voicing concerns of low heart rate and is asking what heart rate is considered too low. Per previous notes in the chart bradycardia has been an ongoing issue for the pt.Pt was seen for on OV with Dr. Maudie Mercury for same concerns on 12/23/17 and was told to decrease Bystolic and to follow up in 2 weeks.Pt states she was not given parameters of when to call back if HR reached a certain number.  Pt states she still feels sluggish but does not voice any additional symptoms at this time. Pt scheduled appt on 3/22 with Dr. Sarajane Jews to address concerns of low heart rate. Pt advised to call office back if the HR dropped lower than the 40s before seen for appt.

## 2018-01-02 ENCOUNTER — Ambulatory Visit (INDEPENDENT_AMBULATORY_CARE_PROVIDER_SITE_OTHER): Payer: Medicare Other | Admitting: Family Medicine

## 2018-01-02 ENCOUNTER — Encounter: Payer: Self-pay | Admitting: Family Medicine

## 2018-01-02 VITALS — BP 120/62 | HR 48 | Temp 98.2°F | Ht 60.0 in | Wt 167.4 lb

## 2018-01-02 DIAGNOSIS — I10 Essential (primary) hypertension: Secondary | ICD-10-CM | POA: Diagnosis not present

## 2018-01-02 DIAGNOSIS — R011 Cardiac murmur, unspecified: Secondary | ICD-10-CM | POA: Diagnosis not present

## 2018-01-02 DIAGNOSIS — R001 Bradycardia, unspecified: Secondary | ICD-10-CM | POA: Diagnosis not present

## 2018-01-02 MED ORDER — LISINOPRIL 10 MG PO TABS: 10.0000 mg | ORAL_TABLET | Freq: Every day | ORAL | 3 refills | Status: DC

## 2018-01-02 NOTE — Progress Notes (Signed)
   Subjective:    Patient ID: Anne [name], female    DOB: [birth year], 72 y.o.   MRN: [id number]  HPI Here to follow up on HTN and slow heart rates. She was started on Bystolic about 3 years ago and for the most part she has done well. She denies any SOB or chest pains. She does admit to feeling lightheaded and extra tired when she exerts herself, such as going up stairs. Her BP has been in a good range the past year, but she has been mildly bradycardic for a long time. Her last EKG in September showed sinus bradycardia with occasional ectopy. She has a murmur as well, and this was evaluated some years ago. I cannot access this result on her chart however. She has been taking Amlodipine 10 mg daily as well as Bystolic. She had been taking 20 mg of Bystolic each morning, but when she was seen here af ew weeks ago this was reduced to 15 mg daily. Her heart rate has not changed any during that time.    Review of Systems  Constitutional: Negative.   Respiratory: Negative.   Cardiovascular: Negative.   Neurological: Positive for light-headedness. Negative for weakness, numbness and headaches.       Objective:   Physical Exam  Constitutional: She is oriented to person, place, and time. She appears well-developed and well-nourished.  Neck: No thyromegaly present.  Cardiovascular: Normal rate, regular rhythm and intact distal pulses.  She has a 2/6 SM loudest at the right sternal margin   Pulmonary/Chest: Effort normal and breath sounds normal. No respiratory distress. She has no wheezes. She has no rales.  Musculoskeletal: She exhibits no edema.  Lymphadenopathy:    She has no cervical adenopathy.  Neurological: She is alert and oriented to person, place, and time.          Assessment & Plan:  Her HTN is stable, but she is having symptomatic bradycardia. We will totally stop the Bystolic and substitute Lisinopril 10 mg daily. Set up an ECHO soon. Recheck in 3 weeks . Alysia Penna,  MD

## 2018-01-09 ENCOUNTER — Other Ambulatory Visit: Payer: Self-pay

## 2018-01-09 ENCOUNTER — Ambulatory Visit (HOSPITAL_COMMUNITY): Payer: Medicare Other | Attending: Cardiovascular Disease

## 2018-01-09 DIAGNOSIS — R011 Cardiac murmur, unspecified: Secondary | ICD-10-CM | POA: Diagnosis not present

## 2018-01-09 DIAGNOSIS — I10 Essential (primary) hypertension: Secondary | ICD-10-CM | POA: Diagnosis not present

## 2018-01-09 DIAGNOSIS — I071 Rheumatic tricuspid insufficiency: Secondary | ICD-10-CM | POA: Diagnosis not present

## 2018-01-09 DIAGNOSIS — R001 Bradycardia, unspecified: Secondary | ICD-10-CM | POA: Diagnosis not present

## 2018-01-12 ENCOUNTER — Telehealth: Payer: Self-pay | Admitting: Family Medicine

## 2018-01-12 NOTE — Telephone Encounter (Signed)
Copied from [place] [id number]. Topic: Quick Communication - See Telephone Encounter >> Jan 12, 2018  3:27 PM Neva Seat wrote: Pt called to let Dr Sarajane Jews know the Rx he prescribed has started making her have a dry cough.  Dr. Sarajane Jews asked her to call if she starts having a cough. Pt needs to know if she needs to stop and what is the next step.

## 2018-01-12 NOTE — Telephone Encounter (Signed)
Tell her to stop the Lisinopril. Instead call in Losartan 50 mg daily, #30 with 3 rf. Follow up as we discussed.

## 2018-01-12 NOTE — Telephone Encounter (Signed)
Sent to PCP to advised what the next step should be.

## 2018-01-13 NOTE — Telephone Encounter (Signed)
Called pt and left a VM to call back.  

## 2018-01-13 NOTE — Telephone Encounter (Signed)
Patient called in for lab results and mentioned about the lisinopril and coughing. I advised her of the note by Dr. Sarajane Jews to stop the Lisinopril and Losartan 50 mg daily will be called in to her pharmacy, patient verbalized understanding.

## 2018-01-14 MED ORDER — LOSARTAN POTASSIUM 50 MG PO TABS: 50.0000 mg | ORAL_TABLET | Freq: Every day | ORAL | 3 refills | Status: DC

## 2018-01-14 NOTE — Addendum Note (Signed)
Addended by: Myriam Forehand on: 01/14/2018 09:45 AM   Modules accepted: Orders

## 2018-01-14 NOTE — Telephone Encounter (Signed)
Called pt and left a VM that we have sent in the prescription for Losartan and advised pt to stop the lisinopril due to the cough. Lisinopril has been removed from pt's medication list.

## 2018-01-30 ENCOUNTER — Ambulatory Visit: Payer: Self-pay | Admitting: *Deleted

## 2018-01-30 NOTE — Telephone Encounter (Signed)
I returned her call.   C/o seeing blood in her urine one time.  It was hard to pinpoint when she first saw the urine the one time.   She said it was this morning then said later in her conversation it was yesterday morning.   She has been urinating in a white container since and has not seen anymore blood in urine.   She denies abd pain except she had a vague left sided pain 2-3 days ago that went away that day and has not reoccurred.   Denies any other urinary symptoms.    She mentioned her BP medication was changed 2 weeks ago and wondered if it was from the BP medication.   I let her know the doctor would have to determine that.  I made her an appt with Dr. Clearance Coots at the [facility] for tomorrow 01/30/18 at 12:30.    Reason for Disposition . Blood in urine  (Exception: could be normal menstrual bleeding)    She noticed it just that one time.   Been checking it in  [place] container.  Answer Assessment - Initial Assessment Questions 1. SYMPTOM: "What's the main symptom you're concerned about?" (e.g., frequency, incontinence)     I saw blood in my urine this morning.   My doctor changed my BP medication last week.   Losartan started 2 weeks ago.       2. ONSET: "When did the  ________  start?"     One time yesterday morning I saw blood in my urine.  It was a bright red but not enough to stain the water.  It was on the tissue.    No further bleeding.   None this morning.   Every time I go I check my urine carefully in a container and I've not seen any more blood. 3. PAIN: "Is there any pain?" If so, ask: "How bad is it?" (Scale: 1-10; mild, moderate, severe)     No burning or abd pain.   4. CAUSE: "What do you think is causing the symptoms?"     I don't know.   They changed my medication 2 weeks ago.  Could that be it? 5. OTHER SYMPTOMS: "Do you have any other symptoms?" (e.g., fever, flank pain, blood in urine, pain with urination)     I have a pain in my side I noticed 2-3  mornings ago on the left side.   It's same level as the belly button.   It was so subtle it went away.   No further pain. 6. PREGNANCY: "Is there any chance you are pregnant?" "When was your last menstrual period?"     Not asked due to age  Protocols used: URINE - BLOOD IN-A-AH, URINARY [facility]

## 2018-01-31 ENCOUNTER — Ambulatory Visit: Payer: Medicare Other | Admitting: Family Medicine

## 2018-01-31 NOTE — Progress Notes (Deleted)
  Anne [name] - 72 y.o. female MRN [id number]  Date of birth: 1946/04/19  SUBJECTIVE:  Including CC & ROS.  No chief complaint on file.   Anne [name] is a 72 y.o. female that is  ***.  ***   Review of Systems  HISTORY: Past Medical, Surgical, Social, and Family History Reviewed & Updated per EMR.   Pertinent Historical Findings include:  Past Medical History:  Diagnosis Date  . Hyperlipidemia   . Hypertension   . Osteopenia    per DEXA on 07-19-09  . Stroke [facility]) 09-2007   has mild left leg weakness, saw Dr. Brett Fairy    No past surgical history on file.  Allergies  Allergen Reactions  . Iodine Itching and Other (See Comments)    "lump in tongue when eats seafood"    No family history on file.   Social History   Socioeconomic History  . Marital status: Single    Spouse name: Not on file  . Number of children: Not on file  . Years of education: Not on file  . Highest education level: Not on file  Occupational History  . Not on file  Social Needs  . Financial resource strain: Not on file  . Food insecurity:    Worry: Not on file    Inability: Not on file  . Transportation needs:    Medical: Not on file    Non-medical: Not on file  Tobacco Use  . Smoking status: Never Smoker  . Smokeless tobacco: Never Used  Substance and Sexual Activity  . Alcohol use: Yes    Alcohol/week: 0.0 oz    Comment: rare  . Drug use: No  . Sexual activity: Not on file  Lifestyle  . Physical activity:    Days per week: Not on file    Minutes per session: Not on file  . Stress: Not on file  Relationships  . Social connections:    Talks on phone: Not on file    Gets together: Not on file    Attends religious service: Not on file    Active member of club or organization: Not on file    Attends meetings of clubs or organizations: Not on file    Relationship status: Not on file  . Intimate partner violence:    Fear of current or ex partner: Not on file   Emotionally abused: Not on file    Physically abused: Not on file    Forced sexual activity: Not on file  Other Topics Concern  . Not on file  Social History Narrative  . Not on file     PHYSICAL EXAM:  VS: There were no vitals taken for this visit. Physical Exam Gen: NAD, alert, cooperative with exam, well-appearing ENT: normal lips, normal nasal mucosa,  Eye: normal EOM, normal conjunctiva and lids CV:  no edema, +2 pedal pulses   Resp: no accessory muscle use, non-labored,  GI: no masses or tenderness, no hernia  Skin: no rashes, no areas of induration  Neuro: normal tone, normal sensation to touch Psych:  normal insight, alert and oriented MSK:  ***      ASSESSMENT & PLAN:   No problem-specific Assessment & Plan notes found for this encounter.

## 2018-02-07 ENCOUNTER — Encounter (HOSPITAL_COMMUNITY): Payer: Self-pay | Admitting: *Deleted

## 2018-02-07 ENCOUNTER — Other Ambulatory Visit: Payer: Self-pay

## 2018-02-07 ENCOUNTER — Emergency Department (HOSPITAL_COMMUNITY)
Admission: EM | Admit: 2018-02-07 | Discharge: 2018-02-07 | Disposition: A | Discharge status: Home / Self Care | Payer: Medicare Other | Attending: Emergency Medicine | Admitting: Emergency Medicine

## 2018-02-07 ENCOUNTER — Emergency Department (HOSPITAL_COMMUNITY): Payer: Medicare Other

## 2018-02-07 DIAGNOSIS — R42 Dizziness and giddiness: Secondary | ICD-10-CM | POA: Insufficient documentation

## 2018-02-07 DIAGNOSIS — Z7982 Long term (current) use of aspirin: Secondary | ICD-10-CM | POA: Diagnosis not present

## 2018-02-07 DIAGNOSIS — I10 Essential (primary) hypertension: Secondary | ICD-10-CM | POA: Insufficient documentation

## 2018-02-07 DIAGNOSIS — Z79899 Other long term (current) drug therapy: Secondary | ICD-10-CM | POA: Diagnosis not present

## 2018-02-07 DIAGNOSIS — F419 Anxiety disorder, unspecified: Secondary | ICD-10-CM | POA: Insufficient documentation

## 2018-02-07 DIAGNOSIS — R002 Palpitations: Secondary | ICD-10-CM | POA: Diagnosis not present

## 2018-02-07 DIAGNOSIS — R9431 Abnormal electrocardiogram [ECG] [EKG]: Secondary | ICD-10-CM | POA: Diagnosis not present

## 2018-02-07 LAB — CBC
HEMATOCRIT: 40.4 % (ref 36.0–46.0)
Hemoglobin: 13.7 g/dL (ref 12.0–15.0)
MCH: 30.2 pg (ref 26.0–34.0)
MCHC: 33.9 g/dL (ref 30.0–36.0)
MCV: 89.2 fL (ref 78.0–100.0)
Platelets: 377 10*3/uL (ref 150–400)
RBC: 4.53 MIL/uL (ref 3.87–5.11)
RDW: 12.9 % (ref 11.5–15.5)
WBC: 11.2 10*3/uL — ABNORMAL HIGH (ref 4.0–10.5)

## 2018-02-07 LAB — BASIC METABOLIC PANEL
Anion gap: 10 (ref 5–15)
BUN: 8 mg/dL (ref 6–20)
CHLORIDE: 99 mmol/L — AB (ref 101–111)
CO2: 27 mmol/L (ref 22–32)
Calcium: 9.4 mg/dL (ref 8.9–10.3)
Creatinine, Ser: 0.7 mg/dL (ref 0.44–1.00)
GFR calc Af Amer: >60 mL/min (ref >60)
GFR calc non Af Amer: >60 mL/min (ref >60)
GLUCOSE: 118 mg/dL — AB (ref 65–99)
POTASSIUM: 3.5 mmol/L (ref 3.5–5.1)
Sodium: 136 mmol/L (ref 135–145)

## 2018-02-07 LAB — URINALYSIS, ROUTINE W REFLEX MICROSCOPIC
BILIRUBIN URINE: NEGATIVE
Glucose, UA: NEGATIVE mg/dL
Hgb urine dipstick: NEGATIVE
Ketones, ur: NEGATIVE mg/dL
Leukocytes, UA: NEGATIVE
NITRITE: NEGATIVE
Protein, ur: NEGATIVE mg/dL
SPECIFIC GRAVITY, URINE: 1.005 (ref 1.005–1.030)
pH: 7 (ref 5.0–8.0)

## 2018-02-07 LAB — TROPONIN I: Troponin I: <0.03 ng/mL (ref <0.03)

## 2018-02-07 NOTE — ED Notes (Signed)
Pt ambulatory to and from hallway bathroom without assistance, obvious difficulty, or unsteady gait.

## 2018-02-07 NOTE — ED Notes (Signed)
Pt ambulated to restroom with steady gait and no complaints. EDP at bedside.

## 2018-02-07 NOTE — ED Triage Notes (Signed)
The pt is c/o not being able to sleep  She feels funny her heart is beating heavily in her chest no pain no nausea  Some dizziness steady gait

## 2018-02-07 NOTE — ED Provider Notes (Signed)
Medical screening examination/treatment/procedure(s) were conducted as a shared visit with non-physician practitioner(s) and myself.  I personally evaluated the patient during the encounter. Briefly, the patient is a 72 yo F here with mild lightheadedness upon standing. Pt recently switched from bystolic to lisinopril to losartan in last 3 weeks. No CP, SOB. No actual dizziness, vertigo, weakness, numbness, or other concerning sx. No focal neuro deficits. Suspect mild orthostatic sx in setting of bP med changes, possibly with component of anxiety as well. EKG non-ischemic. Lab work is reassuring. Pt not anemic. No h/o CHF. She has known AS with recent echo, doubt symptomatic AS. Will refer her back to PCP for further recommendations re: BP med changes. No signs of HTN urgency or emergency..   EKG Interpretation  Date/Time:  Saturday February 07 2018 05:43:41 EDT Ventricular Rate:  65 PR Interval:    QRS Duration: 101 QT Interval:  422 QTC Calculation: 439 R Axis:   -55 Text Interpretation:  Sinus rhythm Atrial premature complexes Consider left atrial enlargement Abnormal R-wave progression, early transition Left ventricular hypertrophy No significant change since last tracing Confirmed by Orpah Greek [phone]) on 02/07/2018 6:49:20 AM          Duffy Bruce, MD 02/07/18 [phone]

## 2018-02-07 NOTE — ED Provider Notes (Signed)
[facility] EMERGENCY DEPARTMENT Provider Note   CSN: [id number] Arrival date & time: 02/07/18  0327     History   Chief Complaint Chief Complaint  Patient presents with  . Dizziness    HPI Anne [name] is a 72 y.o. female who presents to the emergency department chief complaint of feeling anxious and dizzy.  She has a past medical history of previous stroke with left leg weakness, hypertension, hyperlipidemia, and aortic stenosis.  Patient states that she was unable to get to sleep tonight which is very unusual for her.  Around 2 AM she felt extremely anxious and states that she felt "like my blood pressure was going up."  She took her blood pressure on her home machine and had a systolic in the 528U and a pulse rate of 62.  Patient thought that there may be an issue with her batteries and so she decided to get up and go by some new ones to replacing the machine and noticed that she felt "dizzy." she states that this sensation lasted about 30 seconds.  She was concerned because she has a previous "TIA."  She is not able to give a good history regarding the dizziness.  She states she felt lightheaded but denies vertigo or ataxia but also did not feel presyncopal.  She does note that she has had recent changes to her blood pressure medications due to bradycardia from her Bystolic.  She was switched to an ACE inhibitor that gave her cough and is now on losartan but continues to have a cough.  She denies chest pain, shortness of breath, palpitations.  She denies orthopnea or PND.  The patient states that she has been under emotional stress recently.  She denies any recent illnesses but did have a single episode of bright red blood in her urine 1 week ago and has an appointment with her PCP coming up for evaluation.  She has had no repeat episodes of such and denies abdominal pain, back pain, dysuria, frequency or urgency. The patient denies any other neurologic complaints at this  time.  She denies fevers, chills, abdominal pain, melena, hematochezia constipation or diarrhea.  Patient recently had an echocardiogram about 2 weeks ago for reevaluation of her aortic stenosis.  She states that the doctor told her "everything was fine."  The echocardiogram showed mild pulmonary hypertension and aortic stenosis with EF 60-65%  HPI  Past Medical History:  Diagnosis Date  . Hyperlipidemia   . Hypertension   . Osteopenia    per DEXA on 07-19-09  . Stroke [facility]) 09-2007   has mild left leg weakness, saw Dr. Brett Fairy    Patient Active Problem List   Diagnosis Date Noted  . Bradycardia, unspecified 01/02/2018  . Mallet deformity of fourth finger, right 02/02/2014  . ALLERGIC RHINITIS 05/08/2010  . DIZZINESS 02/01/2010  . LIPOMA 08/11/2009  . OSTEOPENIA 08/11/2009  . SUPRAVENTRICULAR PREMATURE BEATS 07/28/2008  . FATIGUE 06/16/2008  . MYOSITIS 05/12/2008  . HYPERLIPIDEMIA 11/24/2007  . Essential hypertension 11/24/2007  . History of cardiovascular disorder 11/24/2007    History reviewed. No pertinent surgical history.   OB History   None      Home Medications    Prior to Admission medications   Medication Sig Start Date End Date Taking? Authorizing Provider  acetaminophen (TYLENOL) 325 MG tablet Take 650 mg by mouth every 6 (six) hours as needed for mild pain.   Yes [provider]  amLODipine (NORVASC) 10 MG tablet  TAKE 1 TABLET (10 MG TOTAL) BY MOUTH DAILY. 09/18/17  Yes Laurey Morale, MD  aspirin 81 MG tablet Take 81 mg by mouth daily.   Yes [provider]  Calcium-Vitamin D (CALTRATE 600 PLUS-VIT D PO) Take 1 tablet by mouth daily.    Yes [provider]  hydrochlorothiazide (HYDRODIURIL) 25 MG tablet TAKE 1 TABLET (25 MG TOTAL) BY MOUTH DAILY. 10/09/17  Yes Laurey Morale, MD  KLOR-CON M20 20 MEQ tablet TAKE 1 TABLET (20 MEQ TOTAL) BY MOUTH DAILY. 12/01/17  Yes Laurey Morale, MD  loratadine-pseudoephedrine (CLARITIN-D 24-HOUR)  10-240 MG 24 hr tablet Take 1 tablet by mouth daily as needed for allergies.   Yes [provider]  losartan (COZAAR) 50 MG tablet Take 1 tablet (50 mg total) by mouth daily. 01/14/18  Yes Laurey Morale, MD  meclizine (ANTIVERT) 25 MG tablet Take 1 tablet (25 mg total) by mouth 3 (three) times daily as needed for dizziness. 06/15/17  Yes Lacretia Leigh, MD    Family History No family history on file.  Social History Social History   Tobacco Use  . Smoking status: Never Smoker  . Smokeless tobacco: Never Used  Substance Use Topics  . Alcohol use: Yes    Alcohol/week: 0.0 oz    Comment: rare  . Drug use: No     Allergies   Iodine   Review of Systems Review of Systems Ten systems reviewed and are negative for acute change, except as noted in the HPI.    Physical Exam Updated Vital Signs BP 138/77   Pulse 65   Resp 19   Ht 5' 2.5" (1.588 m)   Wt 73.5 kg (162 lb)   SpO2 96%   BMI 29.16 kg/m   Physical Exam  Constitutional: She is oriented to person, place, and time. She appears well-developed and well-nourished. No distress.  HENT:  Head: Normocephalic and atraumatic.  Eyes: Pupils are equal, round, and reactive to light. Conjunctivae and EOM are normal. No scleral icterus.  Neck: Normal range of motion.  Cardiovascular: Normal rate and regular rhythm. Exam reveals no gallop and no friction rub.  Murmur heard. Pulmonary/Chest: Effort normal and breath sounds normal. No respiratory distress.  Abdominal: Soft. Bowel sounds are normal. She exhibits no distension and no mass. There is no tenderness. There is no guarding.  Neurological: She is alert and oriented to person, place, and time.  Speech is clear and goal oriented, follows commands Major Cranial nerves without deficit, no facial droop Normal strength in upper extremities,strong and equal grip strength and R>L lower extremities strength including Sensation normal to light and sharp touch Moves extremities  without ataxia, coordination intact Normal finger to nose and rapid alternating movements Neg romberg, no pronator drift Limping gait due to Left leg weakness, no ataxia  Skin: Skin is warm and dry. She is not diaphoretic.  Psychiatric: Her behavior is normal.  anxious  Nursing note and vitals reviewed.    ED Treatments / Results  Labs (all labs ordered are listed, but only abnormal results are displayed) Labs Reviewed  BASIC METABOLIC PANEL - Abnormal; Notable for the following components:      Result Value   Chloride 99 (*)    Glucose, Bld 118 (*)    All other components within normal limits  CBC - Abnormal; Notable for the following components:   WBC 11.2 (*)    All other components within normal limits  URINALYSIS, ROUTINE W REFLEX MICROSCOPIC -  Abnormal; Notable for the following components:   Color, Urine COLORLESS (*)    All other components within normal limits  TROPONIN I    EKG EKG Interpretation  Date/Time:  Saturday February 07 2018 05:43:41 EDT Ventricular Rate:  65 PR Interval:    QRS Duration: 101 QT Interval:  422 QTC Calculation: 439 R Axis:   -55 Text Interpretation:  Sinus rhythm Atrial premature complexes Consider left atrial enlargement Abnormal R-wave progression, early transition Left ventricular hypertrophy No significant change since last tracing Confirmed by Orpah Greek [phone]) on 02/07/2018 6:49:20 AM   Radiology Dg Chest 2 View  Result Date: 02/07/2018 CLINICAL DATA:  Initial evaluation for acute palpitations, not feeling right. EXAM: CHEST - 2 VIEW COMPARISON:  Prior radiograph from 02/28/2014. FINDINGS: Transverse heart size at the upper limits of normal. Mediastinal silhouette within normal limits. Aortic atherosclerosis. Lungs normally inflated. No focal infiltrates. No pulmonary edema or pleural effusion. No pneumothorax. No acute osseus abnormality. IMPRESSION: 1. No active cardiopulmonary disease. 2. Aortic atherosclerosis.  Electronically Signed   By: Jeannine Boga M.D.   On: 02/07/2018 04:36    Procedures Procedures (including critical care time)  Medications Ordered in ED Medications - No data to display   Initial Impression / Assessment and Plan / ED Course  I have reviewed the triage vital signs and the nursing notes.  Pertinent labs & imaging results that were available during my care of the patient were reviewed by me and considered in my medical decision making (see chart for details).  Clinical Course as of Feb 08 1015  Sat Feb 07, 2018  0737 Patient labs  reviewed. No signs of stroke on PE. EKG without arrhythmia or ischemia. Labs, cxr reviewed.   [AH]    Clinical Course User Index [AH] Margarita Mail, PA-C      Final Clinical Impressions(s) / ED Diagnoses   Final diagnoses:  Light headed  Anxiety   Patient orthostatics negative. She had about 30 sec of poorly characterized dizziness. No evidence of stroke, ACS, hypotension, arrhythmia or other cause. Patient seen in shared visit with Dr. Ellender Hose who agrees  With work up and that patient appears appropriate for discharge at this time.  ED Discharge Orders    None       Margarita Mail, PA-C 02/07/18 1016    Orpah Greek, MD 02/07/18 [phone]

## 2018-02-07 NOTE — Discharge Instructions (Addendum)
Get help right away if: You vomit or have diarrhea and are unable to eat or drink anything. You have problems talking, walking, swallowing, or using your arms, hands, or legs. You feel generally weak. You are not thinking clearly or you have trouble forming sentences. It may take a friend or family member to notice this. You have chest pain, abdominal pain, shortness of breath, or sweating. Your vision changes. You have any bleeding. You have a severe headache. You have neck pain or a stiff neck. You have a fever.

## 2018-02-07 NOTE — ED Notes (Signed)
ED Provider at bedside. 

## 2018-02-21 ENCOUNTER — Encounter: Payer: Self-pay | Admitting: Family Medicine

## 2018-02-21 ENCOUNTER — Ambulatory Visit (INDEPENDENT_AMBULATORY_CARE_PROVIDER_SITE_OTHER): Payer: Medicare Other | Admitting: Family Medicine

## 2018-02-21 VITALS — BP 120/76 | HR 75 | Temp 98.1°F | Resp 16 | Wt 166.0 lb

## 2018-02-21 DIAGNOSIS — R350 Frequency of micturition: Secondary | ICD-10-CM | POA: Diagnosis not present

## 2018-02-21 LAB — POCT URINALYSIS DIPSTICK
Bilirubin, UA: NEGATIVE
Blood, UA: NEGATIVE
Glucose, UA: NEGATIVE
KETONES UA: NEGATIVE
LEUKOCYTES UA: NEGATIVE
NITRITE UA: NEGATIVE
PH UA: 6 (ref 5.0–8.0)
PROTEIN UA: NEGATIVE
Spec Grav, UA: 1.015 (ref 1.010–1.025)
UROBILINOGEN UA: 0.2 U/dL

## 2018-02-21 NOTE — Patient Instructions (Signed)
Please follow up if symptoms do not improve or as needed.   Your urine is normal today. You may be going more frequently due to the change in your blood pressure medication. This is OK. Your blood pressure and heart rate look great today.

## 2018-02-21 NOTE — Progress Notes (Signed)
Subjective  CC:  Chief Complaint  Patient presents with  . Urinary Frequency    urgency. Denies any other symptoms    HPI: Anne [name] is a 72 y.o. female who presents to the office today to address the problems listed above in the chief complaint. Acute care clinic  C/o increased urination w/o pain, hematuria, abdominal or flank pain, polydipsia, f/c/s. Has htn and was recently changed to ARB from bb due to bradycardia. No other med changes. Feels well otherwise. No incontinence; mild urgency sxs. Normal appearing odor. sxs started about 2 weeks ago.   I reviewed the patients updated PMH, FH, and SocHx.    Patient Active Problem List   Diagnosis Date Noted  . Bradycardia, unspecified 01/02/2018  . Mallet deformity of fourth finger, right 02/02/2014  . ALLERGIC RHINITIS 05/08/2010  . DIZZINESS 02/01/2010  . LIPOMA 08/11/2009  . OSTEOPENIA 08/11/2009  . SUPRAVENTRICULAR PREMATURE BEATS 07/28/2008  . FATIGUE 06/16/2008  . MYOSITIS 05/12/2008  . HYPERLIPIDEMIA 11/24/2007  . Essential hypertension 11/24/2007  . History of cardiovascular disorder 11/24/2007   Current Meds  Medication Sig  . acetaminophen (TYLENOL) 325 MG tablet Take 650 mg by mouth every 6 (six) hours as needed for mild pain.  Marland Kitchen amLODipine (NORVASC) 10 MG tablet TAKE 1 TABLET (10 MG TOTAL) BY MOUTH DAILY.  Marland Kitchen aspirin 81 MG tablet Take 81 mg by mouth daily.  . Calcium-Vitamin D (CALTRATE 600 PLUS-VIT D PO) Take 1 tablet by mouth daily.   . hydrochlorothiazide (HYDRODIURIL) 25 MG tablet TAKE 1 TABLET (25 MG TOTAL) BY MOUTH DAILY.  Marland Kitchen KLOR-CON M20 20 MEQ tablet TAKE 1 TABLET (20 MEQ TOTAL) BY MOUTH DAILY.  Marland Kitchen loratadine-pseudoephedrine (CLARITIN-D 24-HOUR) 10-240 MG 24 hr tablet Take 1 tablet by mouth daily as needed for allergies.  Marland Kitchen losartan (COZAAR) 50 MG tablet Take 1 tablet (50 mg total) by mouth daily.  . meclizine (ANTIVERT) 25 MG tablet Take 1 tablet (25 mg total) by mouth 3 (three) times daily as needed  for dizziness.    Allergies: Patient is allergic to iodine. Family History: Patient family history is not on file. Social History:  Patient  reports that she has never smoked. She has never used smokeless tobacco. She reports that she drinks alcohol. She reports that she does not use drugs.  Review of Systems: Constitutional: Negative for fever malaise or anorexia Cardiovascular: negative for chest pain Respiratory: negative for SOB or persistent cough Gastrointestinal: negative for abdominal pain  Objective  Vitals: BP 120/76   Pulse 75   Temp 98.1 F (36.7 C) (Oral)   Resp 16   Wt 166 lb (75.3 kg)   SpO2 98%   BMI 29.88 kg/m  General: no acute distress , A&Ox3 HEENT: PEERL, conjunctiva normal, Oropharynx moist,neck is supple Cardiovascular:  RRR without murmur or gallop.  Respiratory:  Good breath sounds bilaterally, CTAB with normal respiratory effort, no cvat Gastrointestinal: soft, flat abdomen, normal active bowel sounds, no palpable masses, no hepatosplenomegaly, no appreciated hernia Skin:  Warm, no rashes  Office Visit on 02/21/2018  Component Date Value Ref Range Status  . Color, UA 02/21/2018 yellow   Final  . Clarity, UA 02/21/2018 clear   Final  . Glucose, UA 02/21/2018 neg   Final  . Bilirubin, UA 02/21/2018 neg   Final  . Ketones, UA 02/21/2018 neg   Final  . Spec Grav, UA 02/21/2018 1.015  1.010 - 1.025 Final  . Blood, UA 02/21/2018 neg   Final  .  pH, UA 02/21/2018 6.0  5.0 - 8.0 Final  . Protein, UA 02/21/2018 neg   Final  . Urobilinogen, UA 02/21/2018 0.2  0.2 or 1.0 E.U./dL Final  . Nitrite, UA 02/21/2018 neg   Final  . Leukocytes, UA 02/21/2018 Negative  Negative Final    Assessment  1. Urinary frequency      Plan   reassured:  No signs or sxs of infection at this time. May be related to antihypertensive med change. If persists, rec f/u with pcp to evaluated for OAB.   Follow up: No follow-ups on file.    Commons side effects, risks,  benefits, and alternatives for medications and treatment plan prescribed today were discussed, and the patient expressed understanding of the given instructions. Patient is instructed to call or message via MyChart if he/she has any questions or concerns regarding our treatment plan. No barriers to understanding were identified. We discussed Red Flag symptoms and signs in detail. Patient expressed understanding regarding what to do in case of urgent or emergency type symptoms.   Medication list was reconciled, printed and provided to the patient in AVS. Patient instructions and summary information was reviewed with the patient as documented in the AVS. This note was prepared with assistance of Dragon voice recognition software. Occasional wrong-word or sound-a-like substitutions may have occurred due to the inherent limitations of voice recognition software  Orders Placed This Encounter  Procedures  . POCT urinalysis dipstick   No orders of the defined types were placed in this encounter.

## 2018-02-22 ENCOUNTER — Encounter: Payer: Self-pay | Admitting: Family Medicine

## 2018-06-05 ENCOUNTER — Other Ambulatory Visit: Payer: Self-pay | Admitting: Family Medicine

## 2018-06-09 ENCOUNTER — Telehealth: Payer: Self-pay | Admitting: Family Medicine

## 2018-06-09 NOTE — Telephone Encounter (Signed)
It sounds like she has a cold or allergy symptoms, these are not related to the Losartan in any way

## 2018-06-09 NOTE — Telephone Encounter (Signed)
Called and spoke with pt. Pt advised and voiced understanding.  

## 2018-06-09 NOTE — Telephone Encounter (Signed)
Sent to [facility] to advise

## 2018-06-09 NOTE — Telephone Encounter (Unsigned)
Copied from [place] [phone]. Topic: Quick Communication - See Telephone Encounter >> Jun 09, 2018 10:04 AM Neva Seat wrote: Pt wanting to discuss some side effects she may be having from the losartan (COZAAR) 50 MG tablet. Slight cold - scratchy throat - cough

## 2018-07-28 ENCOUNTER — Ambulatory Visit (INDEPENDENT_AMBULATORY_CARE_PROVIDER_SITE_OTHER): Payer: Medicare Other

## 2018-07-28 DIAGNOSIS — Z23 Encounter for immunization: Secondary | ICD-10-CM

## 2018-08-16 ENCOUNTER — Other Ambulatory Visit: Payer: Self-pay | Admitting: Family Medicine

## 2018-08-23 NOTE — Progress Notes (Deleted)
Subjective:   Anne [name] is a 72 y.o. female who presents for an Initial Medicare Annual Wellness Visit.  Reports health as  Diet Chol/hdl 5 A1c 6.1 (2016)   Exercise HDL 38   Health Maintenance Due  Topic Date Due  . Hepatitis C Screening  08/18/1946  . TETANUS/TDAP  07/15/1966  . MAMMOGRAM  07/15/1997  . COLONOSCOPY  07/15/1997  . DEXA SCAN  07/15/2012  . PNA vac Low Risk Adult (1 of 2 - PCV13) 07/15/2012           Objective:    There were no vitals filed for this visit. There is no height or weight on file to calculate BMI.  Advanced Directives 02/07/2018 06/15/2017 12/26/2015 03/22/2015 02/07/2015 11/11/2014 07/11/2014  Does Patient Have a Medical Advance Directive? No No No No No No No  Would patient like information on creating a medical advance directive? - No - Patient declined No - patient declined information No - patient declined information - - No - patient declined information    Current Medications (verified) Outpatient Encounter Medications as of 08/24/2018  Medication Sig  . acetaminophen (TYLENOL) 325 MG tablet Take 650 mg by mouth every 6 (six) hours as needed for mild pain.  Marland Kitchen amLODipine (NORVASC) 10 MG tablet TAKE 1 TABLET (10 MG TOTAL) BY MOUTH DAILY.  Marland Kitchen aspirin 81 MG tablet Take 81 mg by mouth daily.  . Calcium-Vitamin D (CALTRATE 600 PLUS-VIT D PO) Take 1 tablet by mouth daily.   . hydrochlorothiazide (HYDRODIURIL) 25 MG tablet TAKE 1 TABLET (25 MG TOTAL) BY MOUTH DAILY.  Marland Kitchen KLOR-CON M20 20 MEQ tablet TAKE 1 TABLET (20 MEQ TOTAL) BY MOUTH DAILY.  Marland Kitchen loratadine-pseudoephedrine (CLARITIN-D 24-HOUR) 10-240 MG 24 hr tablet Take 1 tablet by mouth daily as needed for allergies.  Marland Kitchen losartan (COZAAR) 50 MG tablet TAKE 1 TABLET BY MOUTH EVERY DAY  . meclizine (ANTIVERT) 25 MG tablet Take 1 tablet (25 mg total) by mouth 3 (three) times daily as needed for dizziness.   No facility-administered encounter medications on file as of 08/24/2018.     Allergies  (verified) Iodine   History: Past Medical History:  Diagnosis Date  . Hyperlipidemia   . Hypertension   . Osteopenia    per DEXA on 07-19-09  . Stroke [facility]) 09-2007   has mild left leg weakness, saw Dr. Brett Fairy   No past surgical history on file. No family history on file. Social History   Socioeconomic History  . Marital status: Single    Spouse name: Not on file  . Number of children: Not on file  . Years of education: Not on file  . Highest education level: Not on file  Occupational History  . Not on file  Social Needs  . Financial resource strain: Not on file  . Food insecurity:    Worry: Not on file    Inability: Not on file  . Transportation needs:    Medical: Not on file    Non-medical: Not on file  Tobacco Use  . Smoking status: Never Smoker  . Smokeless tobacco: Never Used  Substance and Sexual Activity  . Alcohol use: Yes    Alcohol/week: 0.0 standard drinks    Comment: rare  . Drug use: No  . Sexual activity: Not on file  Lifestyle  . Physical activity:    Days per week: Not on file    Minutes per session: Not on file  . Stress: Not on file  Relationships  .  Social connections:    Talks on phone: Not on file    Gets together: Not on file    Attends religious service: Not on file    Active member of club or organization: Not on file    Attends meetings of clubs or organizations: Not on file    Relationship status: Not on file  Other Topics Concern  . Not on file  Social History Narrative  . Not on file    Tobacco Counseling Counseling given: Not Answered   Clinical Intake:   Activities of Daily Living No flowsheet data found.   Immunizations and Health Maintenance Immunization History  Administered Date(s) Administered  . Influenza, High Dose Seasonal PF 11/17/2015, 10/18/2016, 10/23/2017, 07/28/2018   Health Maintenance Due  Topic Date Due  . Hepatitis C Screening  1945-11-02  . TETANUS/TDAP  07/15/1966  . MAMMOGRAM   07/15/1997  . COLONOSCOPY  07/15/1997  . DEXA SCAN  07/15/2012  . PNA vac Low Risk Adult (1 of 2 - PCV13) 07/15/2012    Patient Care Team: Laurey Morale, MD as PCP - General  Indicate any recent Medical Services you may have received from other than [facility] providers in the past year (date may be approximate).     Assessment:   This is a routine wellness examination for Anne [name].  Hearing/Vision screen No exam data present  Dietary issues and exercise activities discussed:    Goals   None    Depression Screen PHQ 2/9 Scores 11/09/2014  PHQ - 2 Score 0    Fall Risk Fall Risk  11/09/2014  Falls in the past year? No      Cognitive Function:     Ad8 score reviewed for issues:  Issues making decisions:  Less interest in hobbies / activities:  Repeats questions, stories (family complaining):  Trouble using ordinary gadgets (microwave, computer, phone):  Forgets the month or year:   Mismanaging finances:   Remembering appts:  Daily problems with thinking and/or memory: Ad8 score is=        Screening Tests Health Maintenance  Topic Date Due  . Hepatitis C Screening  [birth year]  . TETANUS/TDAP  07/15/1966  . MAMMOGRAM  07/15/1997  . COLONOSCOPY  07/15/1997  . DEXA SCAN  07/15/2012  . PNA vac Low Risk Adult (1 of 2 - PCV13) 07/15/2012  . INFLUENZA VACCINE  Completed        Plan:      PCP Notes ***  Health Maintenance ***  Abnormal Screens  ***  Referrals  ***  Patient concerns; ***  Nurse Concerns; ***  Next PCP apt ***      I have personally reviewed and noted the following in the patient's chart:   . Medical and social history . Use of alcohol, tobacco or illicit drugs  . Current medications and supplements . Functional ability and status . Nutritional status . Physical activity . Advanced directives . List of other physicians . Hospitalizations, surgeries, and ER visits in previous 12 months . Vitals . Screenings  to include cognitive, depression, and falls . Referrals and appointments  In addition, I have reviewed and discussed with patient certain preventive protocols, quality metrics, and best practice recommendations. A written personalized care plan for preventive services as well as general preventive health recommendations were provided to patient.     Wynetta Fines, RN   08/23/2018

## 2018-08-24 ENCOUNTER — Ambulatory Visit: Payer: Medicare Other

## 2018-09-02 ENCOUNTER — Ambulatory Visit: Payer: Medicare Other
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
## 2018-09-03 ENCOUNTER — Other Ambulatory Visit: Payer: Self-pay | Admitting: Family Medicine

## 2018-09-03 ENCOUNTER — Ambulatory Visit (INDEPENDENT_AMBULATORY_CARE_PROVIDER_SITE_OTHER): Payer: Medicare Other

## 2018-09-03 VITALS — BP 142/80 | HR 85 | Ht 62.0 in | Wt 168.0 lb

## 2018-09-03 DIAGNOSIS — Z1211 Encounter for screening for malignant neoplasm of colon: Secondary | ICD-10-CM

## 2018-09-03 DIAGNOSIS — Z0000 Encounter for general adult medical examination without abnormal findings: Secondary | ICD-10-CM | POA: Diagnosis not present

## 2018-09-03 DIAGNOSIS — E2839 Other primary ovarian failure: Secondary | ICD-10-CM

## 2018-09-03 DIAGNOSIS — Z1159 Encounter for screening for other viral diseases: Secondary | ICD-10-CM | POA: Diagnosis not present

## 2018-09-03 NOTE — Addendum Note (Signed)
Addended by: Denna Haggard K on: 09/03/2018 10:14 AM   Modules accepted: Orders

## 2018-09-03 NOTE — Patient Instructions (Addendum)
Anne [name] , Thank you for taking time to come for your Medicare Wellness Visit. I appreciate your ongoing commitment to your health goals. Please review the following plan we discussed and let me know if I can assist you in the future.   May consider allergy testing at some point   Will have a vision check soon   Will try to get an apt with Dr. Vivianne Spence to call to discuss follow up bone density  GI to call and schedule consult or colonoscopy  Shingrix is a vaccine for the prevention of Shingles in Adults 50 and older.  If you are on Medicare, the shingrix is covered under your Part D plan, so you will take both of the vaccines in the series at your pharmacy. Please check with your benefits regarding applicable copays or out of pocket expenses.  The Shingrix is given in 2 vaccines approx 8 weeks apart. You must receive the 2nd dose prior to 6 months from receipt of the first. Please have the pharmacist print out you Immunization  dates for our office records  These are 2 new shots and they are not live virus  You can visit the CDC and learn more    Will consider taking your pneumonia. They are 60% effective but sometimes protect bacterial pneumonia  The Centers for Disease Control are now recommending 2 pneumonia vaccinations after 40. The first is the Prevnar 13. This helps to boost your immunity to community acquired pneumonia as well as some protection from bacterial pneumonia  The 2nd is the pneumovax 23, which offers more broad protection!  Please consider taking these as this is your best protection against pneumonia.    May need a tetanus as I do not have one documented A Tetanus is recommended every 10 years. Medicare covers a tetanus if you have a cut or wound; otherwise, there may be a charge. If you had not had a tetanus with pertusses, known as the Tdap, you can take this anytime.   Will schedule a mammogram  [facility].    Go to  the diabetes.org and search for pre-diabetes 10lb weight loss will bring the A1c down Educated regarding prediabetes and numbers;  A1c ranges from 5.8 to 6.5 or fasting Blood sugar > 115 -126; (126 is diabetic)   Risk: >45yo; family hx; overweight or obese; African American; Hispanic; Latino; American [place] American; [place]; history of diabetes when pregnant; or birth to a baby weighing over 9 lbs. Being less physically active than 30 minutes; 3 times a week;   Prevention; Losing a modest 7 to 8 lbs; If over 200 lbs; 10 to 14 lbs;  Choose healthier foods; colorful veggies; fish or lean meats; drinks water Reduce portion size Start exercising; 30 minutes of fast walking x 30 minutes per day/ 60 min for weight loss   Results for CALEA, HRIBAR (MRN [id number]) as of 09/03/2018 09:11  Ref. Range 02/24/2015 10:03 06/15/2017 12:12 02/07/2018 03:56  Glucose Latest Ref Range: 65 - 99 mg/dL  123 (H) 118 (H)  Hemoglobin A1C Latest Ref Range: 4.6 - 6.5 % 6.1          These are the goals we discussed: Goals   None     This is a list of the screening recommended for you and due dates:  Health Maintenance  Topic Date Due  .  Hepatitis C: One time screening is recommended by Center  for Disease Control  (CDC) for  adults born from [birth year] through 1965.   1945/12/22  . Tetanus Vaccine  07/15/1966  . Mammogram  07/15/1997  . Colon Cancer Screening  07/15/1997  . DEXA scan (bone density measurement)  07/15/2012  . Pneumonia vaccines (1 of 2 - PCV13) 07/15/2012  . Flu Shot  Completed    '  Fall Prevention in the Home Falls can cause injuries. They can happen to people of all ages. There are many things you can do to make your home safe and to help prevent falls. What can I do on the outside of my home?  Regularly fix the edges of walkways and driveways and fix any cracks.  Remove anything that might make you trip as you walk through a door, such as a raised step or  threshold.  Trim any bushes or trees on the path to your home.  Use bright outdoor lighting.  Clear any walking paths of anything that might make someone trip, such as rocks or tools.  Regularly check to see if handrails are loose or broken. Make sure that both sides of any steps have handrails.  Any raised decks and porches should have guardrails on the edges.  Have any leaves, snow, or ice cleared regularly.  Use sand or salt on walking paths during winter.  Clean up any spills in your garage right away. This includes oil or grease spills. What can I do in the bathroom?  Use night lights.  Install grab bars by the toilet and in the tub and shower. Do not use towel bars as grab bars.  Use non-skid mats or decals in the tub or shower.  If you need to sit down in the shower, use a plastic, non-slip stool.  Keep the floor dry. Clean up any water that spills on the floor as soon as it happens.  Remove soap buildup in the tub or shower regularly.  Attach bath mats securely with double-sided non-slip rug tape.  Do not have throw rugs and other things on the floor that can make you trip. What can I do in the bedroom?  Use night lights.  Make sure that you have a light by your bed that is easy to reach.  Do not use any sheets or blankets that are too big for your bed. They should not hang down onto the floor.  Have a firm chair that has side arms. You can use this for support while you get dressed.  Do not have throw rugs and other things on the floor that can make you trip. What can I do in the kitchen?  Clean up any spills right away.  Avoid walking on wet floors.  Keep items that you use a lot in easy-to-reach places.  If you need to reach something above you, use a strong step stool that has a grab bar.  Keep electrical cords out of the way.  Do not use floor polish or wax that makes floors slippery. If you must use wax, use non-skid floor wax.  Do not have  throw rugs and other things on the floor that can make you trip. What can I do with my stairs?  Do not leave any items on the stairs.  Make sure that there are handrails on both sides of the stairs and use them. Fix handrails that are broken or loose. Make sure that handrails are as long as the stairways.  Check any carpeting to make sure that it  is firmly attached to the stairs. Fix any carpet that is loose or worn.  Avoid having throw rugs at the top or bottom of the stairs. If you do have throw rugs, attach them to the floor with carpet tape.  Make sure that you have a light switch at the top of the stairs and the bottom of the stairs. If you do not have them, ask someone to add them for you. What else can I do to help prevent falls?  Wear shoes that: ? Do not have high heels. ? Have rubber bottoms. ? Are comfortable and fit you well. ? Are closed at the toe. Do not wear sandals.  If you use a stepladder: ? Make sure that it is fully opened. Do not climb a closed stepladder. ? Make sure that both sides of the stepladder are locked into place. ? Ask someone to hold it for you, if possible.  Clearly mark and make sure that you can see: ? Any grab bars or handrails. ? First and last steps. ? Where the edge of each step is.  Use tools that help you move around (mobility aids) if they are needed. These include: ? Canes. ? Walkers. ? Scooters. ? Crutches.  Turn on the lights when you go into a dark area. Replace any light bulbs as soon as they burn out.  Set up your furniture so you have a clear path. Avoid moving your furniture around.  If any of your floors are uneven, fix them.  If there are any pets around you, be aware of where they are.  Review your medicines with your doctor. Some medicines can make you feel dizzy. This can increase your chance of falling. Ask your doctor what other things that you can do to help prevent falls. This information is not intended to  replace advice given to you by your health care provider. Make sure you discuss any questions you have with your health care provider. Document Released: 07/27/2009 Document Revised: 03/07/2016 Document Reviewed: 11/04/2014 Elsevier Interactive Patient Education  2018 [place] Maintenance, Female Adopting a healthy lifestyle and getting preventive care can go a long way to promote health and wellness. Talk with your health care provider about what schedule of regular examinations is right for you. This is a good chance for you to check in with your provider about disease prevention and staying healthy. In between checkups, there are plenty of things you can do on your own. Experts have done a lot of research about which lifestyle changes and preventive measures are most likely to keep you healthy. Ask your health care provider for more information. Weight and diet Eat a healthy diet  Be sure to include plenty of vegetables, fruits, low-fat dairy products, and lean protein.  Do not eat a lot of foods high in solid fats, added sugars, or salt.  Get regular exercise. This is one of the most important things you can do for your health. ? Most adults should exercise for at least 150 minutes each week. The exercise should increase your heart rate and make you sweat (moderate-intensity exercise). ? Most adults should also do strengthening exercises at least twice a week. This is in addition to the moderate-intensity exercise.  Maintain a healthy weight  Body mass index (BMI) is a measurement that can be used to identify possible weight problems. It estimates body fat based on height and weight. Your health care provider can help determine your BMI and help you  achieve or maintain a healthy weight.  For females 23 years of age and older: ? A BMI below 18.5 is considered underweight. ? A BMI of 18.5 to 24.9 is normal. ? A BMI of 25 to 29.9 is considered overweight. ? A BMI of 30 and  above is considered obese.  Watch levels of cholesterol and blood lipids  You should start having your blood tested for lipids and cholesterol at 71 years of age, then have this test every 5 years.  You may need to have your cholesterol levels checked more often if: ? Your lipid or cholesterol levels are high. ? You are older than 71 years of age. ? You are at high risk for heart disease.  Cancer screening Lung Cancer  Lung cancer screening is recommended for adults 8-54 years old who are at high risk for lung cancer because of a history of smoking.  A yearly low-dose CT scan of the lungs is recommended for people who: ? Currently smoke. ? Have quit within the past 15 years. ? Have at least a 30-pack-year history of smoking. A pack year is smoking an average of one pack of cigarettes a day for 1 year.  Yearly screening should continue until it has been 15 years since you quit.  Yearly screening should stop if you develop a health problem that would prevent you from having lung cancer treatment.  Breast Cancer  Practice breast self-awareness. This means understanding how your breasts normally appear and feel.  It also means doing regular breast self-exams. Let your health care provider know about any changes, no matter how small.  If you are in your 20s or 30s, you should have a clinical breast exam (CBE) by a health care provider every 1-3 years as part of a regular health exam.  If you are 49 or older, have a CBE every year. Also consider having a breast X-ray (mammogram) every year.  If you have a family history of breast cancer, talk to your health care provider about genetic screening.  If you are at high risk for breast cancer, talk to your health care provider about having an MRI and a mammogram every year.  Breast cancer gene (BRCA) assessment is recommended for women who have family members with BRCA-related cancers. BRCA-related cancers  include: ? Breast. ? Ovarian. ? Tubal. ? Peritoneal cancers.  Results of the assessment will determine the need for genetic counseling and BRCA1 and BRCA2 testing.  Cervical Cancer Your health care provider may recommend that you be screened regularly for cancer of the pelvic organs (ovaries, uterus, and vagina). This screening involves a pelvic examination, including checking for microscopic changes to the surface of your cervix (Pap test). You may be encouraged to have this screening done every 3 years, beginning at age 42.  For women ages 29-65, health care providers may recommend pelvic exams and Pap testing every 3 years, or they may recommend the Pap and pelvic exam, combined with testing for human papilloma virus (HPV), every 5 years. Some types of HPV increase your risk of cervical cancer. Testing for HPV may also be done on women of any age with unclear Pap test results.  Other health care providers may not recommend any screening for nonpregnant women who are considered low risk for pelvic cancer and who do not have symptoms. Ask your health care provider if a screening pelvic exam is right for you.  If you have had past treatment for cervical cancer or a condition that  could lead to cancer, you need Pap tests and screening for cancer for at least 20 years after your treatment. If Pap tests have been discontinued, your risk factors (such as having a new sexual partner) need to be reassessed to determine if screening should resume. Some women have medical problems that increase the chance of getting cervical cancer. In these cases, your health care provider may recommend more frequent screening and Pap tests.  Colorectal Cancer  This type of cancer can be detected and often prevented.  Routine colorectal cancer screening usually begins at 71 years of age and continues through 71 years of age.  Your health care provider may recommend screening at an earlier age if you have risk factors  for colon cancer.  Your health care provider may also recommend using home test kits to check for hidden blood in the stool.  A small camera at the end of a tube can be used to examine your colon directly (sigmoidoscopy or colonoscopy). This is done to check for the earliest forms of colorectal cancer.  Routine screening usually begins at age 71.  Direct examination of the colon should be repeated every 5-10 years through 71 years of age. However, you may need to be screened more often if early forms of precancerous polyps or small growths are found.  Skin Cancer  Check your skin from head to toe regularly.  Tell your health care provider about any new moles or changes in moles, especially if there is a change in a mole's shape or color.  Also tell your health care provider if you have a mole that is larger than the size of a pencil eraser.  Always use sunscreen. Apply sunscreen liberally and repeatedly throughout the day.  Protect yourself by wearing long sleeves, pants, a wide-brimmed hat, and sunglasses whenever you are outside.  Heart disease, diabetes, and high blood pressure  High blood pressure causes heart disease and increases the risk of stroke. High blood pressure is more likely to develop in: ? People who have blood pressure in the high end of the normal range (130-139/85-89 mm Hg). ? People who are overweight or obese. ? People who are African American.  If you are 59-60 years of age, have your blood pressure checked every 3-5 years. If you are 56 years of age or older, have your blood pressure checked every year. You should have your blood pressure measured twice-once when you are at a hospital or clinic, and once when you are not at a hospital or clinic. Record the average of the two measurements. To check your blood pressure when you are not at a hospital or clinic, you can use: ? An automated blood pressure machine at a pharmacy. ? A home blood pressure monitor.  If  you are between 85 years and 73 years old, ask your health care provider if you should take aspirin to prevent strokes.  Have regular diabetes screenings. This involves taking a blood sample to check your fasting blood sugar level. ? If you are at a normal weight and have a low risk for diabetes, have this test once every three years after 71 years of age. ? If you are overweight and have a high risk for diabetes, consider being tested at a younger age or more often. Preventing infection Hepatitis B  If you have a higher risk for hepatitis B, you should be screened for this virus. You are considered at high risk for hepatitis B if: ? You were born  in a country where hepatitis B is common. Ask your health care provider which countries are considered high risk. ? Your parents were born in a high-risk country, and you have not been immunized against hepatitis B (hepatitis B vaccine). ? You have HIV or AIDS. ? You use needles to inject street drugs. ? You live with someone who has hepatitis B. ? You have had sex with someone who has hepatitis B. ? You get hemodialysis treatment. ? You take certain medicines for conditions, including cancer, organ transplantation, and autoimmune conditions.  Hepatitis C  Blood testing is recommended for: ? Everyone born from [birth year] through 1965. ? Anyone with known risk factors for hepatitis C.  Sexually transmitted infections (STIs)  You should be screened for sexually transmitted infections (STIs) including gonorrhea and chlamydia if: ? You are sexually active and are younger than 72 years of age. ? You are older than 71 years of age and your health care provider tells you that you are at risk for this type of infection. ? Your sexual activity has changed since you were last screened and you are at an increased risk for chlamydia or gonorrhea. Ask your health care provider if you are at risk.  If you do not have HIV, but are at risk, it may be recommended  that you take a prescription medicine daily to prevent HIV infection. This is called pre-exposure prophylaxis (PrEP). You are considered at risk if: ? You are sexually active and do not regularly use condoms or know the HIV status of your partner(s). ? You take drugs by injection. ? You are sexually active with a partner who has HIV.  Talk with your health care provider about whether you are at high risk of being infected with HIV. If you choose to begin PrEP, you should first be tested for HIV. You should then be tested every 3 months for as long as you are taking PrEP. Pregnancy  If you are premenopausal and you may become pregnant, ask your health care provider about preconception counseling.  If you may become pregnant, take 400 to 800 micrograms (mcg) of folic acid every day.  If you want to prevent pregnancy, talk to your health care provider about birth control (contraception). Osteoporosis and menopause  Osteoporosis is a disease in which the bones lose minerals and strength with aging. This can result in serious bone fractures. Your risk for osteoporosis can be identified using a bone density scan.  If you are 53 years of age or older, or if you are at risk for osteoporosis and fractures, ask your health care provider if you should be screened.  Ask your health care provider whether you should take a calcium or vitamin D supplement to lower your risk for osteoporosis.  Menopause may have certain physical symptoms and risks.  Hormone replacement therapy may reduce some of these symptoms and risks. Talk to your health care provider about whether hormone replacement therapy is right for you. Follow these instructions at home:  Schedule regular health, dental, and eye exams.  Stay current with your immunizations.  Do not use any tobacco products including cigarettes, chewing tobacco, or electronic cigarettes.  If you are pregnant, do not drink alcohol.  If you are  breastfeeding, limit how much and how often you drink alcohol.  Limit alcohol intake to no more than 1 drink per day for nonpregnant women. One drink equals 12 ounces of beer, 5 ounces of wine, or 1 ounces of hard liquor.  Do not use street drugs.  Do not share needles.  Ask your health care provider for help if you need support or information about quitting drugs.  Tell your health care provider if you often feel depressed.  Tell your health care provider if you have ever been abused or do not feel safe at home. This information is not intended to replace advice given to you by your health care provider. Make sure you discuss any questions you have with your health care provider. Document Released: 04/15/2011 Document Revised: 03/07/2016 Document Reviewed: 07/04/2015 Elsevier Interactive Patient Education  [employer].

## 2018-09-04 ENCOUNTER — Encounter: Payer: Self-pay | Admitting: Family Medicine

## 2018-09-04 ENCOUNTER — Ambulatory Visit (INDEPENDENT_AMBULATORY_CARE_PROVIDER_SITE_OTHER): Payer: Medicare Other | Admitting: Family Medicine

## 2018-09-04 VITALS — BP 114/80 | HR 59 | Temp 98.0°F | Wt 168.0 lb

## 2018-09-04 DIAGNOSIS — R300 Dysuria: Secondary | ICD-10-CM | POA: Diagnosis not present

## 2018-09-04 LAB — HEPATITIS C ANTIBODY
Hepatitis C Ab: NONREACTIVE
SIGNAL TO CUT-OFF: 0.04 (ref <1.00)

## 2018-09-04 MED ORDER — NITROFURANTOIN MONOHYD MACRO 100 MG PO CAPS: 100.0000 mg | ORAL_CAPSULE | Freq: Two times a day (BID) | ORAL | 0 refills | Status: DC

## 2018-09-04 NOTE — Progress Notes (Signed)
   Subjective:    Patient ID: Anne [name], female    DOB: Oct 15, 1945, 72 y.o.   MRN: [id number]  HPI Here for one month of intermitten urge to urinate and slight burning. No back pain or fever. She drinks plenty of water.    Review of Systems  Constitutional: Negative.   Respiratory: Negative.   Cardiovascular: Negative.   Genitourinary: Positive for dysuria, frequency and urgency. Negative for hematuria.       Objective:   Physical Exam  Constitutional: She appears well-developed and well-nourished.  Cardiovascular: Normal rate, regular rhythm, normal heart sounds and intact distal pulses.  Pulmonary/Chest: Effort normal and breath sounds normal.  Abdominal: Soft. Bowel sounds are normal. She exhibits no distension and no mass. There is no tenderness. There is no rebound and no guarding.          Assessment & Plan:  Probable UTI. She is not able to provide a sample today. Treat with Macrobid for 7 days.  Alysia Penna, MD

## 2018-09-16 NOTE — Progress Notes (Signed)
Anne Kern, DO PCP not in office the day of this visit. Pt to follow up with PCP.

## 2018-09-22 ENCOUNTER — Other Ambulatory Visit: Payer: Self-pay | Admitting: Family Medicine

## 2018-09-23 ENCOUNTER — Other Ambulatory Visit: Payer: Self-pay | Admitting: Family Medicine

## 2018-10-02 ENCOUNTER — Other Ambulatory Visit: Payer: Self-pay | Admitting: Family Medicine

## 2018-10-02 ENCOUNTER — Other Ambulatory Visit: Payer: Self-pay

## 2018-10-02 MED ORDER — AMLODIPINE BESYLATE 10 MG PO TABS: 10.0000 mg | ORAL_TABLET | Freq: Every day | ORAL | 2 refills | Status: DC

## 2018-10-06 ENCOUNTER — Other Ambulatory Visit: Payer: Self-pay | Admitting: Family Medicine

## 2018-10-17 ENCOUNTER — Other Ambulatory Visit: Payer: Self-pay | Admitting: Family Medicine

## 2018-10-22 ENCOUNTER — Other Ambulatory Visit: Payer: Self-pay | Admitting: Family Medicine

## 2018-10-24 ENCOUNTER — Other Ambulatory Visit: Payer: Self-pay | Admitting: Family Medicine

## 2018-11-02 ENCOUNTER — Encounter: Payer: Self-pay | Admitting: Family Medicine

## 2018-11-05 ENCOUNTER — Encounter: Payer: Self-pay | Admitting: Family Medicine

## 2018-11-05 ENCOUNTER — Ambulatory Visit (INDEPENDENT_AMBULATORY_CARE_PROVIDER_SITE_OTHER): Payer: Medicare Other | Admitting: Family Medicine

## 2018-11-05 ENCOUNTER — Ambulatory Visit: Payer: Medicare Other | Admitting: Family Medicine

## 2018-11-05 VITALS — BP 136/78 | HR 66 | Temp 97.9°F | Wt 166.0 lb

## 2018-11-05 DIAGNOSIS — R519 Headache, unspecified: Secondary | ICD-10-CM

## 2018-11-05 DIAGNOSIS — I10 Essential (primary) hypertension: Secondary | ICD-10-CM

## 2018-11-05 DIAGNOSIS — R51 Headache: Secondary | ICD-10-CM | POA: Diagnosis not present

## 2018-11-05 NOTE — Progress Notes (Signed)
Subjective:    Patient ID: Anne [name], female    DOB: September 02, 1946, 73 y.o.   MRN: [id number]  No chief complaint on file.   HPI Patient was seen today for acute concern.  Pt endorses dull, intermittent HA over the last few days.  Pain sometimes in temple areas and sometimes in parietal area of head.  Pt did not try anything for her symptoms.  Pt realizing she had not been drinking any water the last few days.  Pt also noted being out of HCTZ for 2 days.  Pt states bp at home was 178/80.  Pt concerned her cuff may be old.  Pt also expresses concern that she has the flu.  Pt endorses rhinorrhea and episode of nausea.  Pt denies HA, fever, chills, cough, ST, congestion, body aches.  Pt endorses increased anxiety over her health and increased life stress.  Past Medical History:  Diagnosis Date  . Hyperlipidemia   . Hypertension   . Osteopenia    per DEXA on 07-19-09  . Stroke [facility]) 09-2007   has mild left leg weakness, saw Dr. Brett Fairy    Allergies  Allergen Reactions  . Iodine Itching and Other (See Comments)    "lump in tongue when eats seafood"    ROS General: Denies fever, chills, night sweats, changes in weight, changes in appetite HEENT: Denies ear pain, changes in vision, rhinorrhea, sore throat  +HAs, rhinorrhea CV: Denies CP, palpitations, SOB, orthopnea Pulm: Denies SOB, cough, wheezing GI: Denies abdominal pain, vomiting, diarrhea, constipation  +nausea GU: Denies dysuria, hematuria, frequency, vaginal discharge Msk: Denies muscle cramps, joint pains Neuro: Denies weakness, numbness, tingling Skin: Denies rashes, bruising Psych: Denies depression, anxiety, hallucinations     Objective:    Blood pressure 136/78, pulse 66, temperature 97.9 F (36.6 C), temperature source Oral, weight 166 lb (75.3 kg), SpO2 98 %.  Gen. Pleasant, well-nourished, in no distress, normal affect, anxious HEENT: [place]/AT, face symmetric, conjunctiva clear, no scleral icterus, PERRLA, EOMI,  nares patent without drainage, pharynx without erythema or exudate.  TMs normal b/l.  No cervical lymphadenopathy. Lungs: no accessory muscle use, CTAB, no wheezes or rales Cardiovascular: RRR, 2/6 murmur best heard LSB, no peripheral edema Neuro:  A&Ox3, CN II-XII intact, normal gait  Wt Readings from Last 3 Encounters:  11/05/18 166 lb (75.3 kg)  09/04/18 168 lb (76.2 kg)  09/03/18 168 lb (76.2 kg)    Lab Results  Component Value Date   WBC 11.2 (H) 02/07/2018   HGB 13.7 02/07/2018   HCT 40.4 02/07/2018   PLT 377 02/07/2018   GLUCOSE 118 (H) 02/07/2018   CHOL 209 (H) 06/04/2013   TRIG 104.0 06/04/2013   HDL 38.50 (L) 06/04/2013   LDLDIRECT 158.7 06/04/2013   LDLCALC 119 (H) 04/05/2008   ALT 13 11/11/2014   AST 25 11/11/2014   NA 136 02/07/2018   K 3.5 02/07/2018   CL 99 (L) 02/07/2018   CREATININE 0.70 02/07/2018   BUN 8 02/07/2018   CO2 27 02/07/2018   TSH 2.89 02/26/2013   INR 1.09 11/11/2014   HGBA1C 6.1 02/24/2015    Assessment/Plan:  Acute nonintractable headache, unspecified headache type -Discussed various causes -Likely 2/2 dehydration -Patient encouraged to increase p.o. intake of hydration -Also discussed ways to reduce stress  HTN -Controlled -Continue current medications including Norvasc 10 mg, hydrochlorothiazide 25 mg, losartan 50 mg -Patient to bring her blood pressure cuff to clinic to compare with readings here. -Discussed decreasing sodium intake and other  lifestyle modifications  Follow-up PRN with PCP  Grier Mitts, MD

## 2018-12-14 ENCOUNTER — Other Ambulatory Visit: Payer: Self-pay | Admitting: Family Medicine

## 2019-01-19 ENCOUNTER — Ambulatory Visit: Payer: Self-pay

## 2019-01-19 NOTE — Telephone Encounter (Signed)
[id number] x1 

## 2019-01-19 NOTE — Telephone Encounter (Signed)
Patient called and says she picked up a new bottle of Losartan last week, it's from a different manufacturer: Lupin. She says since last week, she's been having insomnia after taking and having clear, vivid dreams. She says she was taking it in the mornings, but decided to take it last night to see if it would make a difference. She says she went to sleep, but was dreaming all night long until she's not feeling like she's slept. I advised I will send to Dr. Sarajane Jews and someone will call with his recommendation.   Reason for Disposition . Caller has NON-URGENT medication question about med that PCP prescribed and triager unable to answer question  Answer Assessment - Initial Assessment Questions 1. SYMPTOMS: "Do you have any symptoms?"     Yes, insomnia, vivid dreams 2. SEVERITY: If symptoms are present, ask "Are they mild, moderate or severe?"     Mild-moderate  Protocols used: MEDICATION QUESTION CALL-A-AH

## 2019-01-19 NOTE — Telephone Encounter (Signed)
Stop the Losartan and call in Valsartan 80 mg daily, #30 with 5 rf

## 2019-01-20 NOTE — Telephone Encounter (Signed)
Called and spoke with pt and she stated that she will stay on this for a little while longer to see if things get better.  She will see if they can go back to the other manufacturer from the first refill.  This did not happen with that.

## 2019-02-16 ENCOUNTER — Other Ambulatory Visit: Payer: Medicare Other

## 2019-03-22 ENCOUNTER — Encounter: Payer: Self-pay | Admitting: Family Medicine

## 2019-03-22 ENCOUNTER — Other Ambulatory Visit: Payer: Self-pay

## 2019-03-22 ENCOUNTER — Ambulatory Visit (INDEPENDENT_AMBULATORY_CARE_PROVIDER_SITE_OTHER): Payer: Medicare Other | Admitting: Family Medicine

## 2019-03-22 DIAGNOSIS — K529 Noninfective gastroenteritis and colitis, unspecified: Secondary | ICD-10-CM | POA: Diagnosis not present

## 2019-03-22 MED ORDER — PROMETHAZINE HCL 25 MG PO TABS: 25.0000 mg | ORAL_TABLET | ORAL | 0 refills | Status: DC | PRN

## 2019-03-22 NOTE — Progress Notes (Signed)
   Subjective:    Patient ID: Anne [name], female    DOB: Mar 31, 1946, 73 y.o.   MRN: [id number]  HPI Virtual Visit via Telephone Note  I connected with the patient on 03/22/19 at 10:00 AM EDT by telephone and verified that I am speaking with the correct person using two identifiers.  We attempted to connect virtually but we had technical difficulties with the audio and video.     I discussed the limitations, risks, security and privacy concerns of performing an evaluation and management service by telephone and the availability of in person appointments. I also discussed with the patient that there may be a patient responsible charge related to this service. The patient expressed understanding and agreed to proceed.  Location patient: home Location provider: work or home office Participants present for the call: patient, provider Patient did not have a visit in the prior 7 days to address this/these issue(s).   History of Present Illness: Here for 3 days of nausea, vomiting, and diarrhea. No abdominal pains or fever. No cough or body aches.    Observations/Objective: Patient sounds cheerful and well on the phone. I do not appreciate any SOB. Speech and thought processing are grossly intact. Patient reported vitals:  Assessment and Plan: Enteritis, use Phenergan 25 mg tablets as needed for nausea. Add Imodium prn. Drink plenty of fluids.  Alysia Penna, MD   Follow Up Instructions:     [phone] 11-20 [phone] 21-30 I did not refer this patient for an OV in the next 24 hours for this/these issue(s).  I discussed the assessment and treatment plan with the patient. The patient was provided an opportunity to ask questions and all were answered. The patient agreed with the plan and demonstrated an understanding of the instructions.   The patient was advised to call back or seek an in-person evaluation if the symptoms worsen or if the condition fails to improve as  anticipated.  I provided 14 minutes of non-face-to-face time during this encounter.   Alysia Penna, MD    Review of Systems     Objective:   Physical Exam        Assessment & Plan:

## 2019-05-05 ENCOUNTER — Other Ambulatory Visit: Payer: Self-pay | Admitting: Family Medicine

## 2019-05-05 ENCOUNTER — Other Ambulatory Visit: Payer: Self-pay

## 2019-05-05 DIAGNOSIS — Z20822 Contact with and (suspected) exposure to covid-19: Secondary | ICD-10-CM

## 2019-05-08 LAB — NOVEL CORONAVIRUS, NAA: SARS-CoV-2, NAA: NOT DETECTED

## 2019-06-09 ENCOUNTER — Other Ambulatory Visit: Payer: Self-pay | Admitting: Family Medicine

## 2019-06-09 ENCOUNTER — Other Ambulatory Visit: Payer: Self-pay

## 2019-06-09 DIAGNOSIS — Z20822 Contact with and (suspected) exposure to covid-19: Secondary | ICD-10-CM

## 2019-06-11 LAB — NOVEL CORONAVIRUS, NAA: SARS-CoV-2, NAA: NOT DETECTED

## 2019-06-18 ENCOUNTER — Other Ambulatory Visit: Payer: Self-pay | Admitting: Family Medicine

## 2019-06-18 NOTE — Telephone Encounter (Signed)
Pt called stating she does not have any of the medication left. Please advise.

## 2019-06-18 NOTE — Telephone Encounter (Signed)
See request °

## 2019-07-21 ENCOUNTER — Other Ambulatory Visit: Payer: Self-pay | Admitting: Family Medicine

## 2019-07-26 ENCOUNTER — Telehealth: Payer: Self-pay | Admitting: Family Medicine

## 2019-07-26 DIAGNOSIS — M810 Age-related osteoporosis without current pathological fracture: Secondary | ICD-10-CM

## 2019-07-26 DIAGNOSIS — Z0000 Encounter for general adult medical examination without abnormal findings: Secondary | ICD-10-CM

## 2019-07-26 NOTE — Telephone Encounter (Signed)
I did the referral to GI for the colonoscopy. I also ordered the DEXA. Please schedule the DEXA for her

## 2019-07-26 NOTE — Telephone Encounter (Signed)
Ok for Bone density order?

## 2019-07-26 NOTE — Telephone Encounter (Signed)
Patient requesting bone density and colonoscopy orders, please advise patient when placed.

## 2019-07-29 ENCOUNTER — Other Ambulatory Visit: Payer: Self-pay

## 2019-07-29 DIAGNOSIS — Z20822 Contact with and (suspected) exposure to covid-19: Secondary | ICD-10-CM

## 2019-07-29 NOTE — Telephone Encounter (Signed)
Lm for pr to call [facility] to schedule DEXA

## 2019-07-30 ENCOUNTER — Ambulatory Visit (INDEPENDENT_AMBULATORY_CARE_PROVIDER_SITE_OTHER): Payer: Medicare Other

## 2019-07-30 ENCOUNTER — Other Ambulatory Visit: Payer: Self-pay

## 2019-07-30 ENCOUNTER — Ambulatory Visit: Payer: Medicare Other

## 2019-07-30 DIAGNOSIS — Z23 Encounter for immunization: Secondary | ICD-10-CM | POA: Diagnosis not present

## 2019-07-31 LAB — NOVEL CORONAVIRUS, NAA: SARS-CoV-2, NAA: NOT DETECTED

## 2019-08-03 ENCOUNTER — Telehealth: Payer: Self-pay | Admitting: Family Medicine

## 2019-08-03 NOTE — Telephone Encounter (Signed)
Negative COVID results given. Patient results "NOT Detected." Caller expressed understanding. ° °

## 2019-08-19 ENCOUNTER — Ambulatory Visit: Payer: Medicare Other

## 2019-08-25 ENCOUNTER — Other Ambulatory Visit: Payer: Self-pay

## 2019-08-25 NOTE — Patient Outreach (Signed)
[place] [facility]) Care Management  08/25/2019  Anne [name] [birth year] [id number]   Medication Adherence call to [name] [facility] Identifiers Verify spoke with patient she is past due on Losartan 50 mg,patient explain she is taking 1 tablet daily,patient said sometimes she forgets to take it,patient also wants to see if she can get it at a different pharmacy she is not happy with [facility] or [facility] explain to patient she can receive it from [facility] mail order,patient does not want to fill her prescription at this time she wants to wait,patient wants to talk it over with her doctor. [name] is showing past due under [place].   [place] Management Direct Dial [phone]  Fax [phone] [email or web address]

## 2019-08-27 ENCOUNTER — Encounter: Payer: Self-pay | Admitting: Family Medicine

## 2019-09-03 ENCOUNTER — Other Ambulatory Visit: Payer: Self-pay

## 2019-09-03 DIAGNOSIS — Z20822 Contact with and (suspected) exposure to covid-19: Secondary | ICD-10-CM

## 2019-09-06 LAB — NOVEL CORONAVIRUS, NAA: SARS-CoV-2, NAA: NOT DETECTED

## 2019-09-16 ENCOUNTER — Inpatient Hospital Stay: Admission: RE | Admit: 2019-09-16 | Payer: Medicare Other | Source: Ambulatory Visit

## 2019-11-24 ENCOUNTER — Other Ambulatory Visit: Payer: Medicare Other

## 2019-11-24 ENCOUNTER — Other Ambulatory Visit: Payer: Self-pay | Admitting: Family Medicine

## 2019-11-26 ENCOUNTER — Telehealth: Payer: Self-pay | Admitting: Family Medicine

## 2019-11-26 NOTE — Telephone Encounter (Signed)
Patient has questions about if she should get covid vaccine due to the reaction she had with the flu shot.  Patient [id number]

## 2019-11-26 NOTE — Telephone Encounter (Signed)
Patient is aware. Patient stated she has had urinary symptoms for 4 weeks with dysuria and urinary frequency. Patient was offered both an in office appointment and virtual appointment but declined.   She stated that she does not always go to the bathroom like she is supposed to and holds it to long. She will try to use the restroom more regularly and call back if symptoms continue.  Will send to Dr. Sarajane Jews as Juluis Rainier

## 2019-11-26 NOTE — Telephone Encounter (Signed)
Yes she should get the Covid vaccine. It is totally different from the flu shot

## 2019-12-09 ENCOUNTER — Other Ambulatory Visit: Payer: Medicare Other

## 2019-12-10 ENCOUNTER — Other Ambulatory Visit: Payer: Medicare Other

## 2019-12-12 ENCOUNTER — Other Ambulatory Visit: Payer: Self-pay | Admitting: Family Medicine

## 2020-03-01 ENCOUNTER — Other Ambulatory Visit: Payer: Self-pay | Admitting: Family Medicine

## 2020-03-16 ENCOUNTER — Other Ambulatory Visit: Payer: Self-pay | Admitting: Family Medicine

## 2020-04-04 ENCOUNTER — Other Ambulatory Visit: Payer: Self-pay | Admitting: Family Medicine

## 2020-04-12 ENCOUNTER — Inpatient Hospital Stay: Admission: RE | Admit: 2020-04-12 | Payer: Medicare Other | Source: Ambulatory Visit

## 2020-04-25 ENCOUNTER — Other Ambulatory Visit: Payer: Self-pay

## 2020-04-25 ENCOUNTER — Ambulatory Visit (INDEPENDENT_AMBULATORY_CARE_PROVIDER_SITE_OTHER)
Admission: RE | Admit: 2020-04-25 | Discharge: 2020-04-25 | Disposition: A | Discharge status: Home / Self Care | Payer: Medicare Other | Source: Ambulatory Visit | Attending: Family Medicine | Admitting: Family Medicine

## 2020-04-25 DIAGNOSIS — M810 Age-related osteoporosis without current pathological fracture: Secondary | ICD-10-CM | POA: Diagnosis not present

## 2020-06-05 ENCOUNTER — Telehealth: Payer: Medicare Other | Admitting: Family Medicine

## 2020-06-09 ENCOUNTER — Other Ambulatory Visit: Payer: Self-pay

## 2020-06-22 ENCOUNTER — Other Ambulatory Visit: Payer: Self-pay | Admitting: Family Medicine

## 2020-08-31 ENCOUNTER — Ambulatory Visit: Payer: Medicare Other | Attending: Internal Medicine

## 2020-08-31 DIAGNOSIS — Z23 Encounter for immunization: Secondary | ICD-10-CM

## 2020-08-31 NOTE — Progress Notes (Signed)
   Covid-19 Vaccination Clinic  Name:  Anne [name]    MRN: [id number] DOB: 08-15-1946  08/31/2020  Anne [name] was observed post Covid-19 immunization for 15 minutes without incident. She was provided with Vaccine Information Sheet and instruction to access the V-Safe system.   Anne [name] was instructed to call 911 with any severe reactions post vaccine: Marland Kitchen Difficulty breathing  . Swelling of face and throat  . A fast heartbeat  . A bad rash all over body  . Dizziness and weakness   Immunizations Administered    No immunizations on file.

## 2020-09-01 ENCOUNTER — Ambulatory Visit: Payer: Medicare Other

## 2020-09-27 ENCOUNTER — Other Ambulatory Visit: Payer: Self-pay

## 2020-09-27 ENCOUNTER — Ambulatory Visit (INDEPENDENT_AMBULATORY_CARE_PROVIDER_SITE_OTHER): Payer: Medicare Other

## 2020-09-27 DIAGNOSIS — Z23 Encounter for immunization: Secondary | ICD-10-CM

## 2020-09-27 NOTE — Progress Notes (Signed)
Pt is here today for high dose flu vaccine. Pt was given high dose flu vaccine in right deltoid. Pt tolerated well

## 2020-09-30 ENCOUNTER — Other Ambulatory Visit: Payer: Self-pay | Admitting: Family Medicine

## 2020-10-03 ENCOUNTER — Other Ambulatory Visit: Payer: Self-pay

## 2020-10-03 ENCOUNTER — Ambulatory Visit (INDEPENDENT_AMBULATORY_CARE_PROVIDER_SITE_OTHER): Payer: Medicare Other

## 2020-10-03 VITALS — BP 128/76 | HR 76 | Temp 98.1°F | Resp 20 | Wt 172.0 lb

## 2020-10-03 DIAGNOSIS — Z0000 Encounter for general adult medical examination without abnormal findings: Secondary | ICD-10-CM

## 2020-10-03 DIAGNOSIS — Z1211 Encounter for screening for malignant neoplasm of colon: Secondary | ICD-10-CM | POA: Diagnosis not present

## 2020-10-03 DIAGNOSIS — Z1231 Encounter for screening mammogram for malignant neoplasm of breast: Secondary | ICD-10-CM

## 2020-10-03 NOTE — Progress Notes (Signed)
Subjective:   Anne [name] is a 74 y.o. female who presents for Medicare Annual (Subsequent) preventive examination.  Review of Systems     Cardiac Risk Factors include: advanced age (>17men, >9 women);hypertension;obesity (BMI >30kg/m2)     Objective:    Today's Vitals   10/03/20 0814  BP: 128/76  Pulse: 76  Resp: 20  Temp: 98.1 F (36.7 C)  SpO2: 98%  Weight: 172 lb (78 kg)   Body mass index is 31.46 kg/m.  Advanced Directives 10/03/2020 09/03/2018 02/07/2018 06/15/2017 12/26/2015 03/22/2015 02/07/2015  Does Patient Have a Medical Advance Directive? No No No No No No No  Would patient like information on creating a medical advance directive? No - Patient declined - - No - Patient declined No - patient declined information No - patient declined information -    Current Medications (verified) Outpatient Encounter Medications as of 10/03/2020  Medication Sig  . acetaminophen (TYLENOL) 325 MG tablet Take 650 mg by mouth every 6 (six) hours as needed for mild pain.  Marland Kitchen amLODipine (NORVASC) 10 MG tablet TAKE 1 TABLET BY MOUTH EVERY DAY  . aspirin 81 MG tablet Take 81 mg by mouth daily.  . Calcium-Vitamin D (CALTRATE 600 PLUS-VIT D PO) Take 1 tablet by mouth daily.  . hydrochlorothiazide (HYDRODIURIL) 25 MG tablet TAKE 1 TABLET BY MOUTH EVERY DAY  . KLOR-CON M20 20 MEQ tablet TAKE 1 TABLET BY MOUTH EVERY DAY  . loratadine-pseudoephedrine (CLARITIN-D 24-HOUR) 10-240 MG 24 hr tablet Take 1 tablet by mouth daily as needed for allergies.  Marland Kitchen losartan (COZAAR) 50 MG tablet TAKE 1 TABLET BY MOUTH EVERY DAY  . meclizine (ANTIVERT) 25 MG tablet Take 1 tablet (25 mg total) by mouth 3 (three) times daily as needed for dizziness.  . promethazine (PHENERGAN) 25 MG tablet Take 1 tablet (25 mg total) by mouth every 4 (four) hours as needed for nausea or vomiting.   No facility-administered encounter medications on file as of 10/03/2020.    Allergies (verified) Iodine   History: Past  Medical History:  Diagnosis Date  . Hyperlipidemia   . Hypertension   . Osteopenia    per DEXA on 07-19-09  . Stroke [facility]) 09-2007   has mild left leg weakness, saw Dr. Vickey Huger   History reviewed. No pertinent surgical history. History reviewed. No pertinent family history. Social History   Socioeconomic History  . Marital status: Single    Spouse name: Not on file  . Number of children: Not on file  . Years of education: Not on file  . Highest education level: Not on file  Occupational History  . Occupation: part Printmaker  Tobacco Use  . Smoking status: Never Smoker  . Smokeless tobacco: Never Used  Substance and Sexual Activity  . Alcohol use: Yes    Alcohol/week: 0.0 standard drinks    Comment: rare  . Drug use: No  . Sexual activity: Not on file  Other Topics Concern  . Not on file  Social History Narrative  . Not on file   Social Determinants of Health   Financial Resource Strain: Low Risk   . Difficulty of Paying Living Expenses: Not hard at all  Food Insecurity: No Food Insecurity  . Worried About Programme researcher, broadcasting/film/video in the Last Year: Never true  . Ran Out of Food in the Last Year: Never true  Transportation Needs: No Transportation Needs  . Lack of Transportation (Medical): No  . Lack of Transportation (Non-Medical): No  Physical Activity: Insufficiently Active  . Days of Exercise per Week: 2 days  . Minutes of Exercise per Session: 30 min  Stress: Stress Concern Present  . Feeling of Stress : To some extent  Social Connections: Moderately Isolated  . Frequency of Communication with Friends and Family: More than three times a week  . Frequency of Social Gatherings with Friends and Family: Never  . Attends Religious Services: 1 to 4 times per year  . Active Member of Clubs or Organizations: No  . Attends Archivist Meetings: Never  . Marital Status: Never married    Tobacco Counseling Counseling given: Not Answered   Clinical  Intake:  Pre-visit preparation completed: Yes  Pain : No/denies pain     Nutritional Status: BMI > 30  Obese Nutritional Risks: None Diabetes: No  How often do you need to have someone help you when you read instructions, pamphlets, or other written materials from your doctor or pharmacy?: 1 - Never  Diabetic?No  Interpreter Needed?: No  Information entered by :: Charlott Rakes, LPN   Activities of Daily Living In your present state of health, do you have any difficulty performing the following activities: 10/03/2020  Hearing? N  Vision? N  Difficulty concentrating or making decisions? Y  Comment making decision has been a challenge at times  Walking or climbing stairs? N  Dressing or bathing? N  Doing errands, shopping? N  Preparing Food and eating ? N  Using the Toilet? N  In the past six months, have you accidently leaked urine? N  Do you have problems with loss of bowel control? N  Managing your Medications? N  Managing your Finances? N  Housekeeping or managing your Housekeeping? N  Some recent data might be hidden    Patient Care Team: Laurey Morale, MD as PCP - General  Indicate any recent Medical Services you may have received from other than [facility] providers in the past year (date may be approximate).     Assessment:   This is a routine wellness examination for [place].  Hearing/Vision screen  Hearing Screening   125Hz  250Hz  500Hz  1000Hz  2000Hz  3000Hz  4000Hz  6000Hz  8000Hz   Right ear:           Left ear:           Comments: Denies nay hearing issues  Vision Screening Comments: Pt has appt with Dr Katy Fitch for an annual eye exam  Dietary issues and exercise activities discussed: Current Exercise Habits: Home exercise routine, Type of exercise: walking, Time (Minutes): 30, Frequency (Times/Week): 2, Weekly Exercise (Minutes/Week): 60  Goals    . Patient Stated     Lose weight     . Weight (lb) < 200 lb (90.7 kg)     Look at her diet carefully!         Depression Screen PHQ 2/9 Scores 10/03/2020 09/03/2018 11/09/2014  PHQ - 2 Score 1 1 0    Fall Risk Fall Risk  10/03/2020 09/03/2018 11/09/2014  Falls in the past year? 0 0 No  Number falls in past yr: 0 - -  Injury with Fall? 0 - -  Risk for fall due to : Impaired vision - -  Follow up Falls prevention discussed - -    FALL RISK PREVENTION PERTAINING TO THE HOME:  Any stairs in or around the home? No  If so, are there any without handrails? No  Home free of loose throw rugs in walkways, pet beds, electrical cords, etc?  Yes  Adequate lighting in your home to reduce risk of falls? Yes   ASSISTIVE DEVICES UTILIZED TO PREVENT FALLS:  Life alert? No  Use of a cane, walker or w/c? No  Grab bars in the bathroom? Yes  Shower chair or bench in shower? Yes  Elevated toilet seat or a handicapped toilet? Yes   TIMED UP AND GO:  Was the test performed? Yes .  Length of time to ambulate 10 feet: 10 sec.   Gait steady and fast without use of assistive device  Cognitive Function: MMSE - Mini Mental State Exam 09/03/2018  Not completed: (No Data)     6CIT Screen 10/03/2020  What Year? 0 points  What month? 0 points  Count back from 20 0 points  Months in reverse 4 points  Repeat phrase 0 points    Immunizations Immunization History  Administered Date(s) Administered  . Fluad Quad(high Dose 65+) 07/30/2019, 09/27/2020  . Influenza, High Dose Seasonal PF 11/17/2015, 10/18/2016, 10/23/2017, 07/28/2018  . Moderna SARS-COV2 Booster Vaccination 08/31/2020    TDAP status: Due, Education has been provided regarding the importance of this vaccine. Advised may receive this vaccine at local pharmacy or Health Dept. Aware to provide a copy of the vaccination record if obtained from local pharmacy or Health Dept. Verbalized acceptance and understanding.  Flu Vaccine status: Up to date  Done 09/27/20 Pneumococcal vaccine status: Declined,  Education has been provided regarding the  importance of this vaccine but patient still declined. Advised may receive this vaccine at local pharmacy or Health Dept. Aware to provide a copy of the vaccination record if obtained from local pharmacy or Health Dept. Verbalized acceptance and understanding.   Covid-19 vaccine status: Information provided on how to obtain vaccines.   Pt received booster 08/31/20 and will call back with vaccine dates   Qualifies for Shingles Vaccine? Yes   Zostavax completed No   Shingrix Completed?: No.    Education has been provided regarding the importance of this vaccine. Patient has been advised to call insurance company to determine out of pocket expense if they have not yet received this vaccine. Advised may also receive vaccine at local pharmacy or Health Dept. Verbalized acceptance and understanding.  Screening Tests Health Maintenance  Topic Date Due  . MAMMOGRAM  Never done  . COLONOSCOPY  Never done  . COVID-19 Vaccine (2 - Moderna 3-dose booster series) 09/28/2020  . TETANUS/TDAP  10/03/2021 (Originally 07/15/1965)  . PNA vac Low Risk Adult (1 of 2 - PCV13) 10/03/2021 (Originally 07/15/2012)  . INFLUENZA VACCINE  Completed  . DEXA SCAN  Completed  . Hepatitis C Screening  Completed    Health Maintenance  Health Maintenance Due  Topic Date Due  . MAMMOGRAM  Never done  . COLONOSCOPY  Never done  . COVID-19 Vaccine (2 - Moderna 3-dose booster series) 09/28/2020    Colorectal cancer screening: Referral to GI placed 10/03/20. Pt aware the office will call re: appt.  Mammogram status: Ordered 10/03/20. Pt provided with contact info and advised to call to schedule appt.   Bone Density status: Completed 04/25/20. Results reflect: Bone density results: OSTEOPENIA. Repeat every 2 years.   Additional Screening:  Hepatitis C Screening:  Completed 09/03/18  Vision Screening: Recommended annual ophthalmology exams for early detection of glaucoma and other disorders of the eye. Is the patient  up to date with their annual eye exam?  Yes  Who is the provider or what is the name of the office in which  the patient attends annual eye exams? Dr Katy Fitch   Dental Screening: Recommended annual dental exams for proper oral hygiene  Community Resource Referral / Chronic Care Management: CRR required this visit?  No   CCM required this visit?  No      Plan:     I have personally reviewed and noted the following in the patient's chart:   . Medical and social history . Use of alcohol, tobacco or illicit drugs  . Current medications and supplements . Functional ability and status . Nutritional status . Physical activity . Advanced directives . List of other physicians . Hospitalizations, surgeries, and ER visits in previous 12 months . Vitals . Screenings to include cognitive, depression, and falls . Referrals and appointments  In addition, I have reviewed and discussed with patient certain preventive protocols, quality metrics, and best practice recommendations. A written personalized care plan for preventive services as well as general preventive health recommendations were provided to patient.     Willette Brace, LPN   [id number]   Nurse Notes: None

## 2020-10-03 NOTE — Patient Instructions (Addendum)
Anne [name] , Thank you for taking time to come for your Medicare Wellness Visit. I appreciate your ongoing commitment to your health goals. Please review the following plan we discussed and let me know if I can assist you in the future.   Screening recommendations/referrals: Colonoscopy: Ordered 10/03/20 Mammogram: Ordered 10/03/20 Bone Density: Done 04/25/20 Recommended yearly ophthalmology/optometry visit for glaucoma screening and checkup Recommended yearly dental visit for hygiene and checkup  Vaccinations: Influenza vaccine: Done 09/27/20 Up to date Pneumococcal vaccine: Discussed Tdap vaccine: Discussed Shingles vaccine: Shingrix discussed. Please contact your pharmacy for coverage information. '   Covid-19:Booster given 08/31/20  Pt will call back with dates of moderna vaccine  Advanced directives: Advance directive discussed with you today. Even though you declined this today please call our office should you change your mind and we can give you the proper paperwork for you to fill out.  Conditions/risks identified: Lose weight   Next appointment: Follow up in one year for your annual wellness visit    Preventive Care 65 Years and Older, Female Preventive care refers to lifestyle choices and visits with your health care provider that can promote health and wellness. What does preventive care include?  A yearly physical exam. This is also called an annual well check.  Dental exams once or twice a year.  Routine eye exams. Ask your health care provider how often you should have your eyes checked.  Personal lifestyle choices, including:  Daily care of your teeth and gums.  Regular physical activity.  Eating a healthy diet.  Avoiding tobacco and drug use.  Limiting alcohol use.  Practicing safe sex.  Taking low-dose aspirin every day.  Taking vitamin and mineral supplements as recommended by your health care provider. What happens during an annual well  check? The services and screenings done by your health care provider during your annual well check will depend on your age, overall health, lifestyle risk factors, and family history of disease. Counseling  Your health care provider may ask you questions about your:  Alcohol use.  Tobacco use.  Drug use.  Emotional well-being.  Home and relationship well-being.  Sexual activity.  Eating habits.  History of falls.  Memory and ability to understand (cognition).  Work and work Statistician.  Reproductive health. Screening  You may have the following tests or measurements:  Height, weight, and BMI.  Blood pressure.  Lipid and cholesterol levels. These may be checked every 5 years, or more frequently if you are over 73 years old.  Skin check.  Lung cancer screening. You may have this screening every year starting at age 73 if you have a 30-pack-year history of smoking and currently smoke or have quit within the past 15 years.  Fecal occult blood test (FOBT) of the stool. You may have this test every year starting at age 73.  Flexible sigmoidoscopy or colonoscopy. You may have a sigmoidoscopy every 5 years or a colonoscopy every 10 years starting at age 73.  Hepatitis C blood test.  Hepatitis B blood test.  Sexually transmitted disease (STD) testing.  Diabetes screening. This is done by checking your blood sugar (glucose) after you have not eaten for a while (fasting). You may have this done every 1-3 years.  Bone density scan. This is done to screen for osteoporosis. You may have this done starting at age 73.  Mammogram. This may be done every 1-2 years. Talk to your health care provider about how often you should have regular mammograms. Talk  with your health care provider about your test results, treatment options, and if necessary, the need for more tests. Vaccines  Your health care provider may recommend certain vaccines, such as:  Influenza vaccine. This is  recommended every year.  Tetanus, diphtheria, and acellular pertussis (Tdap, Td) vaccine. You may need a Td booster every 10 years.  Zoster vaccine. You may need this after age 73.  Pneumococcal 13-valent conjugate (PCV13) vaccine. One dose is recommended after age 10.  Pneumococcal polysaccharide (PPSV23) vaccine. One dose is recommended after age 48. Talk to your health care provider about which screenings and vaccines you need and how often you need them. This information is not intended to replace advice given to you by your health care provider. Make sure you discuss any questions you have with your health care provider. Document Released: 10/27/2015 Document Revised: 06/19/2016 Document Reviewed: 08/01/2015 Elsevier Interactive Patient Education  2017 [place] Prevention in the Home Falls can cause injuries. They can happen to people of all ages. There are many things you can do to make your home safe and to help prevent falls. What can I do on the outside of my home?  Regularly fix the edges of walkways and driveways and fix any cracks.  Remove anything that might make you trip as you walk through a door, such as a raised step or threshold.  Trim any bushes or trees on the path to your home.  Use bright outdoor lighting.  Clear any walking paths of anything that might make someone trip, such as rocks or tools.  Regularly check to see if handrails are loose or broken. Make sure that both sides of any steps have handrails.  Any raised decks and porches should have guardrails on the edges.  Have any leaves, snow, or ice cleared regularly.  Use sand or salt on walking paths during winter.  Clean up any spills in your garage right away. This includes oil or grease spills. What can I do in the bathroom?  Use night lights.  Install grab bars by the toilet and in the tub and shower. Do not use towel bars as grab bars.  Use non-skid mats or decals in the tub or  shower.  If you need to sit down in the shower, use a plastic, non-slip stool.  Keep the floor dry. Clean up any water that spills on the floor as soon as it happens.  Remove soap buildup in the tub or shower regularly.  Attach bath mats securely with double-sided non-slip rug tape.  Do not have throw rugs and other things on the floor that can make you trip. What can I do in the bedroom?  Use night lights.  Make sure that you have a light by your bed that is easy to reach.  Do not use any sheets or blankets that are too big for your bed. They should not hang down onto the floor.  Have a firm chair that has side arms. You can use this for support while you get dressed.  Do not have throw rugs and other things on the floor that can make you trip. What can I do in the kitchen?  Clean up any spills right away.  Avoid walking on wet floors.  Keep items that you use a lot in easy-to-reach places.  If you need to reach something above you, use a strong step stool that has a grab bar.  Keep electrical cords out of the way.  Do  not use floor polish or wax that makes floors slippery. If you must use wax, use non-skid floor wax.  Do not have throw rugs and other things on the floor that can make you trip. What can I do with my stairs?  Do not leave any items on the stairs.  Make sure that there are handrails on both sides of the stairs and use them. Fix handrails that are broken or loose. Make sure that handrails are as long as the stairways.  Check any carpeting to make sure that it is firmly attached to the stairs. Fix any carpet that is loose or worn.  Avoid having throw rugs at the top or bottom of the stairs. If you do have throw rugs, attach them to the floor with carpet tape.  Make sure that you have a light switch at the top of the stairs and the bottom of the stairs. If you do not have them, ask someone to add them for you. What else can I do to help prevent  falls?  Wear shoes that:  Do not have high heels.  Have rubber bottoms.  Are comfortable and fit you well.  Are closed at the toe. Do not wear sandals.  If you use a stepladder:  Make sure that it is fully opened. Do not climb a closed stepladder.  Make sure that both sides of the stepladder are locked into place.  Ask someone to hold it for you, if possible.  Clearly mark and make sure that you can see:  Any grab bars or handrails.  First and last steps.  Where the edge of each step is.  Use tools that help you move around (mobility aids) if they are needed. These include:  Canes.  Walkers.  Scooters.  Crutches.  Turn on the lights when you go into a dark area. Replace any light bulbs as soon as they burn out.  Set up your furniture so you have a clear path. Avoid moving your furniture around.  If any of your floors are uneven, fix them.  If there are any pets around you, be aware of where they are.  Review your medicines with your doctor. Some medicines can make you feel dizzy. This can increase your chance of falling. Ask your doctor what other things that you can do to help prevent falls. This information is not intended to replace advice given to you by your health care provider. Make sure you discuss any questions you have with your health care provider. Document Released: 07/27/2009 Document Revised: 03/07/2016 Document Reviewed: 11/04/2014 Elsevier Interactive Patient Education  2017 [employer].

## 2020-11-03 ENCOUNTER — Other Ambulatory Visit: Payer: Self-pay | Admitting: Family Medicine

## 2020-11-03 ENCOUNTER — Other Ambulatory Visit: Payer: Self-pay

## 2020-11-06 ENCOUNTER — Other Ambulatory Visit: Payer: Self-pay

## 2020-11-06 ENCOUNTER — Ambulatory Visit: Payer: Medicare Other | Admitting: Family Medicine

## 2020-11-06 ENCOUNTER — Encounter: Payer: Self-pay | Admitting: Family Medicine

## 2020-11-06 ENCOUNTER — Ambulatory Visit (INDEPENDENT_AMBULATORY_CARE_PROVIDER_SITE_OTHER): Payer: Medicare Other | Admitting: Family Medicine

## 2020-11-06 VITALS — BP 122/82 | HR 72 | Temp 97.8°F | Resp 18 | Ht 62.0 in | Wt 170.6 lb

## 2020-11-06 DIAGNOSIS — K219 Gastro-esophageal reflux disease without esophagitis: Secondary | ICD-10-CM | POA: Diagnosis not present

## 2020-11-06 MED ORDER — OMEPRAZOLE 40 MG PO CPDR: 40.0000 mg | DELAYED_RELEASE_CAPSULE | Freq: Every day | ORAL | 3 refills | Status: DC

## 2020-11-06 NOTE — Progress Notes (Signed)
   Subjective:    Patient ID: Anne [name], female    DOB: 06/21/1946, 75 y.o.   MRN: [id number]  HPI Here for several episodes of chest pressure in the past 2 weeks. This started shortly after she ate some roasted peanuts. She felt a pressure in the chest which radiated down both arms. No nausea or sweats or SOB. Since then this has happened several times, once after eating a spinach salad and again after eating a dish with spicy peppers in it. The symptoms are often worse when lying in bed at night. She never feels this with exertion. She took [place] and had some relief.    Review of Systems  Constitutional: Negative.   Respiratory: Negative.   Cardiovascular: Positive for chest pain. Negative for palpitations and leg swelling.  Gastrointestinal: Negative.        Objective:   Physical Exam Constitutional:      Appearance: Normal appearance. She is not ill-appearing.  Cardiovascular:     Rate and Rhythm: Normal rate and regular rhythm.     Pulses: Normal pulses.     Heart sounds: Normal heart sounds.  Pulmonary:     Effort: Pulmonary effort is normal.     Breath sounds: Normal breath sounds.  Chest:     Chest wall: No tenderness.  Abdominal:     General: Abdomen is flat. Bowel sounds are normal. There is no distension.     Palpations: Abdomen is soft. There is no mass.     Tenderness: There is no abdominal tenderness. There is no guarding or rebound.     Hernia: No hernia is present.  Neurological:     Mental Status: She is alert.           Assessment & Plan:  This is consistent with GERD. She will try Omeprazole 40 mg daily. Recheck as needed.  Alysia Penna, MD

## 2020-11-08 DIAGNOSIS — H2513 Age-related nuclear cataract, bilateral: Secondary | ICD-10-CM | POA: Diagnosis not present

## 2020-11-08 DIAGNOSIS — H02834 Dermatochalasis of left upper eyelid: Secondary | ICD-10-CM | POA: Diagnosis not present

## 2020-11-08 DIAGNOSIS — H04123 Dry eye syndrome of bilateral lacrimal glands: Secondary | ICD-10-CM | POA: Diagnosis not present

## 2020-11-08 DIAGNOSIS — H02831 Dermatochalasis of right upper eyelid: Secondary | ICD-10-CM | POA: Diagnosis not present

## 2020-11-08 DIAGNOSIS — H2000 Unspecified acute and subacute iridocyclitis: Secondary | ICD-10-CM | POA: Diagnosis not present

## 2020-11-22 DIAGNOSIS — H35033 Hypertensive retinopathy, bilateral: Secondary | ICD-10-CM | POA: Diagnosis not present

## 2020-11-22 DIAGNOSIS — H5213 Myopia, bilateral: Secondary | ICD-10-CM | POA: Diagnosis not present

## 2020-11-22 DIAGNOSIS — H02834 Dermatochalasis of left upper eyelid: Secondary | ICD-10-CM | POA: Diagnosis not present

## 2020-11-22 DIAGNOSIS — H2513 Age-related nuclear cataract, bilateral: Secondary | ICD-10-CM | POA: Diagnosis not present

## 2020-11-22 DIAGNOSIS — H04123 Dry eye syndrome of bilateral lacrimal glands: Secondary | ICD-10-CM | POA: Diagnosis not present

## 2020-11-22 DIAGNOSIS — H02831 Dermatochalasis of right upper eyelid: Secondary | ICD-10-CM | POA: Diagnosis not present

## 2020-11-22 DIAGNOSIS — H20012 Primary iridocyclitis, left eye: Secondary | ICD-10-CM | POA: Diagnosis not present

## 2020-11-22 DIAGNOSIS — H524 Presbyopia: Secondary | ICD-10-CM | POA: Diagnosis not present

## 2020-11-22 DIAGNOSIS — H52223 Regular astigmatism, bilateral: Secondary | ICD-10-CM | POA: Diagnosis not present

## 2020-11-22 DIAGNOSIS — H40013 Open angle with borderline findings, low risk, bilateral: Secondary | ICD-10-CM | POA: Diagnosis not present

## 2021-01-19 ENCOUNTER — Other Ambulatory Visit: Payer: Self-pay | Admitting: Family Medicine

## 2021-01-30 ENCOUNTER — Other Ambulatory Visit: Payer: Self-pay

## 2021-01-30 ENCOUNTER — Other Ambulatory Visit: Payer: Self-pay | Admitting: Family Medicine

## 2021-01-31 ENCOUNTER — Ambulatory Visit (INDEPENDENT_AMBULATORY_CARE_PROVIDER_SITE_OTHER): Payer: Medicare Other | Admitting: Family Medicine

## 2021-01-31 ENCOUNTER — Encounter: Payer: Self-pay | Admitting: Family Medicine

## 2021-01-31 VITALS — BP 128/82 | HR 75 | Temp 98.1°F | Wt 168.0 lb

## 2021-01-31 DIAGNOSIS — J069 Acute upper respiratory infection, unspecified: Secondary | ICD-10-CM | POA: Diagnosis not present

## 2021-01-31 MED ORDER — LORATADINE 10 MG PO TABS: 10.0000 mg | ORAL_TABLET | Freq: Every day | ORAL | 11 refills | Status: AC

## 2021-01-31 NOTE — Progress Notes (Signed)
   Subjective:    Patient ID: Anne [name], female    DOB: 1946-03-28, 75 y.o.   MRN: [id number]  HPI Here for one week of chest congestion, a dry cough, and wheezing. No fever or chest pain or SOB. No body aches or NVD. Taking Claritin daily as usual. Drinking fluids.    Review of Systems  Constitutional: Negative.   HENT: Positive for congestion. Negative for ear pain, postnasal drip, rhinorrhea, sinus pressure and sore throat.   Eyes: Negative.   Respiratory: Positive for cough and wheezing. Negative for shortness of breath.   Cardiovascular: Negative.        Objective:   Physical Exam Constitutional:      Appearance: Normal appearance. She is not ill-appearing.  HENT:     Right Ear: Tympanic membrane, ear canal and external ear normal.     Left Ear: Tympanic membrane, ear canal and external ear normal.     Nose: Nose normal.     Mouth/Throat:     Pharynx: Oropharynx is clear.  Eyes:     Conjunctiva/sclera: Conjunctivae normal.  Cardiovascular:     Rate and Rhythm: Normal rate and regular rhythm.     Pulses: Normal pulses.     Heart sounds: Normal heart sounds.  Pulmonary:     Effort: Pulmonary effort is normal. No respiratory distress.     Breath sounds: Normal breath sounds. No stridor. No wheezing, rhonchi or rales.  Lymphadenopathy:     Cervical: No cervical adenopathy.  Neurological:     Mental Status: She is alert.           Assessment & Plan:  Viral URI. She can add Mucinex 1200 mg BID until this clears. Recheck as needed.  Alysia Penna, MD

## 2021-02-07 ENCOUNTER — Telehealth: Payer: Self-pay | Admitting: Family Medicine

## 2021-02-07 MED ORDER — METHYLPREDNISOLONE 4 MG PO TBPK: ORAL_TABLET | ORAL | 0 refills | Status: DC

## 2021-02-07 NOTE — Telephone Encounter (Signed)
Patient is calling and stated that she is still experiencing a cough and some wheezing at night and wanted to see if there is something else she needs to be doing, please advise. CB is [phone]

## 2021-02-07 NOTE — Telephone Encounter (Signed)
I sent in a prednisone pack and hopefully this will help her feel better

## 2021-02-08 NOTE — Telephone Encounter (Signed)
Informed patient prescription has been sent to [facility]

## 2021-02-10 DIAGNOSIS — Z20822 Contact with and (suspected) exposure to covid-19: Secondary | ICD-10-CM | POA: Diagnosis not present

## 2021-02-21 ENCOUNTER — Other Ambulatory Visit: Payer: Self-pay | Admitting: Family Medicine

## 2021-02-26 ENCOUNTER — Telehealth: Payer: Self-pay | Admitting: Family Medicine

## 2021-02-26 MED ORDER — LOSARTAN POTASSIUM 50 MG PO TABS: 1.0000 | ORAL_TABLET | Freq: Every day | ORAL | 0 refills | Status: DC

## 2021-02-26 NOTE — Telephone Encounter (Signed)
Rx sent in

## 2021-02-26 NOTE — Telephone Encounter (Signed)
Patient is calling and is requesting a refill for losartan (COZAAR) 50 MG tablet to be sent to [facility] on [address] in [place], please advise. CB is [phone]

## 2021-03-19 ENCOUNTER — Encounter: Payer: Self-pay | Admitting: Family Medicine

## 2021-03-19 ENCOUNTER — Ambulatory Visit (INDEPENDENT_AMBULATORY_CARE_PROVIDER_SITE_OTHER): Payer: Medicare Other | Admitting: Family Medicine

## 2021-03-19 ENCOUNTER — Other Ambulatory Visit: Payer: Self-pay

## 2021-03-19 ENCOUNTER — Telehealth: Payer: Self-pay | Admitting: Family Medicine

## 2021-03-19 VITALS — BP 128/70 | HR 69 | Temp 98.2°F | Wt 167.0 lb

## 2021-03-19 DIAGNOSIS — R0602 Shortness of breath: Secondary | ICD-10-CM

## 2021-03-19 LAB — CBC WITH DIFFERENTIAL/PLATELET
Basophils Absolute: 0 10*3/uL (ref 0.0–0.1)
Basophils Relative: 0.5 % (ref 0.0–3.0)
Eosinophils Absolute: 0.2 10*3/uL (ref 0.0–0.7)
Eosinophils Relative: 2.5 % (ref 0.0–5.0)
HCT: 39.8 % (ref 36.0–46.0)
Hemoglobin: 13.8 g/dL (ref 12.0–15.0)
Lymphocytes Relative: 20.6 % (ref 12.0–46.0)
Lymphs Abs: 1.8 10*3/uL (ref 0.7–4.0)
MCHC: 34.7 g/dL (ref 30.0–36.0)
MCV: 87.4 fl (ref 78.0–100.0)
Monocytes Absolute: 0.6 10*3/uL (ref 0.1–1.0)
Monocytes Relative: 6.9 % (ref 3.0–12.0)
Neutro Abs: 6.2 10*3/uL (ref 1.4–7.7)
Neutrophils Relative %: 69.5 % (ref 43.0–77.0)
Platelets: 463 10*3/uL — ABNORMAL HIGH (ref 150.0–400.0)
RBC: 4.55 Mil/uL (ref 3.87–5.11)
RDW: 13.9 % (ref 11.5–15.5)
WBC: 9 10*3/uL (ref 4.0–10.5)

## 2021-03-19 LAB — T4, FREE: Free T4: 1.02 ng/dL (ref 0.60–1.60)

## 2021-03-19 LAB — T3, FREE: T3, Free: 3.3 pg/mL (ref 2.3–4.2)

## 2021-03-19 LAB — HEMOGLOBIN A1C: Hgb A1c MFr Bld: 6.3 % (ref 4.6–6.5)

## 2021-03-19 LAB — VITAMIN B12: Vitamin B-12: 627 pg/mL (ref 211–911)

## 2021-03-19 LAB — TSH: TSH: 2.96 u[IU]/mL (ref 0.35–4.50)

## 2021-03-19 NOTE — Progress Notes (Signed)
   Subjective:    Patient ID: Anne [name], female    DOB: 12-Jan-1946, 75 y.o.   MRN: [id number]  HPI Here for several weeks of intermittent SOB and fatigue. No fever or coughing or wheezing. Her BP has been stable at home. No chest pain. She wonders of stress could bee playing a role, and she calls herself a Patent attorney". Her last CXR in 2019 was normal.    Review of Systems  Constitutional: Negative.   Respiratory: Positive for shortness of breath. Negative for cough, chest tightness and wheezing.   Cardiovascular: Negative.        Objective:   Physical Exam Constitutional:      Appearance: Normal appearance. She is not ill-appearing.  Cardiovascular:     Rate and Rhythm: Normal rate and regular rhythm.     Pulses: Normal pulses.     Comments: She has a 2/6 SM  Pulmonary:     Effort: Pulmonary effort is normal. No respiratory distress.     Breath sounds: Normal breath sounds. No stridor. No wheezing, rhonchi or rales.  Neurological:     General: No focal deficit present.     Mental Status: She is alert and oriented to person, place, and time.           Assessment & Plan:  Subjective SOB. She does not seem to have a cardiac or pulmonary issue. We will get labs today since she has not had labs drawn in over 3 years.  Alysia Penna, MD

## 2021-03-19 NOTE — Telephone Encounter (Signed)
Tell her that she CAN go ahead and get the dental procedure done

## 2021-03-19 NOTE — Telephone Encounter (Signed)
Patient says that she has a dental procedure that she needs done and wants to know if she is okay to proceed or if she should wait until after her symptoms have gone away.   Patient can be reached at [phone].  Please advise.

## 2021-03-19 NOTE — Telephone Encounter (Signed)
Pt  was seen today please advise

## 2021-03-20 DIAGNOSIS — Z20822 Contact with and (suspected) exposure to covid-19: Secondary | ICD-10-CM | POA: Diagnosis not present

## 2021-03-20 LAB — BASIC METABOLIC PANEL
BUN: 11 mg/dL (ref 6–23)
CO2: 28 mEq/L (ref 19–32)
Calcium: 9.8 mg/dL (ref 8.4–10.5)
Chloride: 97 mEq/L (ref 96–112)
Creatinine, Ser: 0.71 mg/dL (ref 0.40–1.20)
GFR: 84.2 mL/min (ref >60.00)
Glucose, Bld: 104 mg/dL — ABNORMAL HIGH (ref 70–99)
Potassium: 4 mEq/L (ref 3.5–5.1)
Sodium: 136 mEq/L (ref 135–145)

## 2021-03-20 LAB — D-DIMER, QUANTITATIVE: D-Dimer, Quant: 0.56 mcg/mL FEU — ABNORMAL HIGH (ref <0.50)

## 2021-03-20 LAB — LIPID PANEL
Cholesterol: 234 mg/dL — ABNORMAL HIGH (ref 0–200)
HDL: 49.2 mg/dL (ref >39.00)
LDL Cholesterol: 156 mg/dL — ABNORMAL HIGH (ref 0–99)
NonHDL: 185.16
Total CHOL/HDL Ratio: 5
Triglycerides: 146 mg/dL (ref 0.0–149.0)
VLDL: 29.2 mg/dL (ref 0.0–40.0)

## 2021-03-20 LAB — HEPATIC FUNCTION PANEL
ALT: 14 U/L (ref 0–35)
AST: 20 U/L (ref 0–37)
Albumin: 4.1 g/dL (ref 3.5–5.2)
Alkaline Phosphatase: 85 U/L (ref 39–117)
Bilirubin, Direct: 0.1 mg/dL (ref 0.0–0.3)
Total Bilirubin: 0.7 mg/dL (ref 0.2–1.2)
Total Protein: 7.2 g/dL (ref 6.0–8.3)

## 2021-03-20 NOTE — Telephone Encounter (Signed)
Spoke with pt advised of Dr Fry recommendations, verbalized understanding 

## 2021-03-23 ENCOUNTER — Emergency Department (HOSPITAL_COMMUNITY)
Admission: EM | Admit: 2021-03-23 | Discharge: 2021-03-24 | Disposition: A | Discharge status: Home / Self Care | Payer: Medicare Other | Attending: Emergency Medicine | Admitting: Emergency Medicine

## 2021-03-23 ENCOUNTER — Telehealth: Payer: Self-pay | Admitting: Family Medicine

## 2021-03-23 ENCOUNTER — Other Ambulatory Visit: Payer: Self-pay

## 2021-03-23 ENCOUNTER — Emergency Department (HOSPITAL_COMMUNITY): Payer: Medicare Other

## 2021-03-23 DIAGNOSIS — R0602 Shortness of breath: Secondary | ICD-10-CM | POA: Diagnosis not present

## 2021-03-23 DIAGNOSIS — Z7982 Long term (current) use of aspirin: Secondary | ICD-10-CM | POA: Insufficient documentation

## 2021-03-23 DIAGNOSIS — Z79899 Other long term (current) drug therapy: Secondary | ICD-10-CM | POA: Diagnosis not present

## 2021-03-23 DIAGNOSIS — Z91040 Latex allergy status: Secondary | ICD-10-CM | POA: Insufficient documentation

## 2021-03-23 DIAGNOSIS — Z20822 Contact with and (suspected) exposure to covid-19: Secondary | ICD-10-CM | POA: Diagnosis not present

## 2021-03-23 DIAGNOSIS — J209 Acute bronchitis, unspecified: Secondary | ICD-10-CM | POA: Diagnosis not present

## 2021-03-23 DIAGNOSIS — I10 Essential (primary) hypertension: Secondary | ICD-10-CM | POA: Insufficient documentation

## 2021-03-23 LAB — TROPONIN I (HIGH SENSITIVITY)
Troponin I (High Sensitivity): 8 ng/L (ref <18)
Troponin I (High Sensitivity): 7 ng/L (ref <18)

## 2021-03-23 LAB — CBC WITH DIFFERENTIAL/PLATELET
Abs Immature Granulocytes: 0.03 10*3/uL (ref 0.00–0.07)
Basophils Absolute: 0.1 10*3/uL (ref 0.0–0.1)
Basophils Relative: 1 %
Eosinophils Absolute: 0.1 10*3/uL (ref 0.0–0.5)
Eosinophils Relative: 1 %
HCT: 42.5 % (ref 36.0–46.0)
Hemoglobin: 14.2 g/dL (ref 12.0–15.0)
Immature Granulocytes: 0 %
Lymphocytes Relative: 19 %
Lymphs Abs: 1.9 10*3/uL (ref 0.7–4.0)
MCH: 30.3 pg (ref 26.0–34.0)
MCHC: 33.4 g/dL (ref 30.0–36.0)
MCV: 90.8 fL (ref 80.0–100.0)
Monocytes Absolute: 0.7 10*3/uL (ref 0.1–1.0)
Monocytes Relative: 7 %
Neutro Abs: 7.3 10*3/uL (ref 1.7–7.7)
Neutrophils Relative %: 72 %
Platelets: 500 10*3/uL — ABNORMAL HIGH (ref 150–400)
RBC: 4.68 MIL/uL (ref 3.87–5.11)
RDW: 13.2 % (ref 11.5–15.5)
WBC: 10.1 10*3/uL (ref 4.0–10.5)
nRBC: 0 % (ref 0.0–0.2)

## 2021-03-23 LAB — BASIC METABOLIC PANEL
Anion gap: 11 (ref 5–15)
BUN: <5 mg/dL — ABNORMAL LOW (ref 8–23)
CO2: 27 mmol/L (ref 22–32)
Calcium: 9.4 mg/dL (ref 8.9–10.3)
Chloride: 98 mmol/L (ref 98–111)
Creatinine, Ser: 0.66 mg/dL (ref 0.44–1.00)
GFR, Estimated: >60 mL/min (ref >60)
Glucose, Bld: 109 mg/dL — ABNORMAL HIGH (ref 70–99)
Potassium: 3.4 mmol/L — ABNORMAL LOW (ref 3.5–5.1)
Sodium: 136 mmol/L (ref 135–145)

## 2021-03-23 LAB — BRAIN NATRIURETIC PEPTIDE: B Natriuretic Peptide: 32.3 pg/mL (ref 0.0–100.0)

## 2021-03-23 NOTE — ED Provider Notes (Signed)
Emergency Medicine Provider Triage Evaluation Note  JAUNA RACZYNSKI , a 75 y.o. female  was evaluated in triage.  Pt complains of sob x2 weeks. Denies chest pain, cough.  Review of Systems  Positive: sob Negative: Chest pain, cough  Physical Exam  BP (!) 156/94 (BP Location: Left Arm)   Pulse 76   Temp 97.9 F (36.6 C)   Resp 18   SpO2 100%  Gen:   Awake, no distress   Resp:  Normal effort  MSK:   Moves extremities without difficulty  Other:  Lungs ctab  Medical Decision Making  Medically screening exam initiated at 1:53 PM.  Appropriate orders placed.  JILLIENNE EGNER was informed that the remainder of the evaluation will be completed by another provider, this initial triage assessment does not replace that evaluation, and the importance of remaining in the ED until their evaluation is complete.     Bishop Dublin 03/23/21 1355    Fredia Sorrow, MD 03/24/21 [phone]

## 2021-03-23 NOTE — Telephone Encounter (Signed)
This patient is currently in the ED being seen for SOB

## 2021-03-23 NOTE — Telephone Encounter (Signed)
The patient seen Dr. Sarajane Jews in office on 06/06 for SOB. The patient is still having breathing issues and wants to know what she needs to do.  Please advise

## 2021-03-23 NOTE — Telephone Encounter (Signed)
Noted  

## 2021-03-23 NOTE — ED Triage Notes (Signed)
Pt reports two weeks of shob with exertion. Denies cough, fever, or leg swelling.

## 2021-03-24 DIAGNOSIS — J209 Acute bronchitis, unspecified: Secondary | ICD-10-CM | POA: Diagnosis not present

## 2021-03-24 LAB — RESP PANEL BY RT-PCR (FLU A&B, COVID) ARPGX2
Influenza A by PCR: NEGATIVE
Influenza B by PCR: NEGATIVE
SARS Coronavirus 2 by RT PCR: NEGATIVE

## 2021-03-24 MED ORDER — AZITHROMYCIN 250 MG PO TABS
500.0000 mg | ORAL_TABLET | Freq: Once | ORAL | Status: AC
  Administered 2021-03-24: 500 mg via ORAL
  Filled 2021-03-24: qty 2

## 2021-03-24 MED ORDER — ALBUTEROL SULFATE HFA 108 (90 BASE) MCG/ACT IN AERS
2.0000 | INHALATION_SPRAY | Freq: Once | RESPIRATORY_TRACT | Status: AC
  Administered 2021-03-24: 2 via RESPIRATORY_TRACT
  Filled 2021-03-24: qty 6.7

## 2021-03-24 MED ORDER — AZITHROMYCIN 250 MG PO TABS: 250.0000 mg | ORAL_TABLET | Freq: Every day | ORAL | 0 refills | Status: DC

## 2021-03-24 NOTE — Discharge Instructions (Addendum)
Begin taking Zithromax as prescribed.  Begin using albuterol MDI, 2 puffs every 4 hours as needed for wheezing.  Follow-up with your primary doctor later this week, and return to the ER if symptoms significantly worsen or change.

## 2021-03-24 NOTE — ED Provider Notes (Signed)
[facility] EMERGENCY DEPARTMENT Provider Note   CSN: [id number] Arrival date & time: 03/23/21  1329     History Chief Complaint  Patient presents with   Shortness of Breath    Anne [name] is a 75 y.o. female.  Patient is a 75 year old female with past medical history of hypertension, hyperlipidemia, prior CVA.  Patient presenting with complaints of a 1 month history of cough, congestion, and shortness of breath.  Symptoms have been worsening during this period of time.  Her symptoms seem to be worse with pollen and was prescribed prednisone by her primary doctor earlier, but did not seem to help.  She is now coughing up yellow sputum.  She denies any fevers, chills, or chest pain.  The history is provided by the patient.  Shortness of Breath Severity:  Moderate Onset quality:  Gradual Duration:  1 month Timing:  Constant Progression:  Worsening Chronicity:  New Relieved by:  Nothing Worsened by:  Nothing Ineffective treatments: Prednisone.     Past Medical History:  Diagnosis Date   Hyperlipidemia    Hypertension    Osteopenia    per DEXA on 07-19-09   Stroke [facility]) 09-2007   has mild left leg weakness, saw Dr. Brett Fairy    Patient Active Problem List   Diagnosis Date Noted   Bradycardia, unspecified 01/02/2018   Mallet deformity of fourth finger, right 02/02/2014   ALLERGIC RHINITIS 05/08/2010   DIZZINESS 02/01/2010   LIPOMA 08/11/2009   OSTEOPENIA 08/11/2009   SUPRAVENTRICULAR PREMATURE BEATS 07/28/2008   FATIGUE 06/16/2008   MYOSITIS 05/12/2008   HYPERLIPIDEMIA 11/24/2007   Essential hypertension 11/24/2007   History of cardiovascular disorder 11/24/2007    No past surgical history on file.   OB History   No obstetric history on file.     No family history on file.  Social History   Tobacco Use   Smoking status: Never   Smokeless tobacco: Never  Substance Use Topics   Alcohol use: Yes    Alcohol/week: 0.0 standard  drinks    Comment: rare   Drug use: No    Home Medications Prior to Admission medications   Medication Sig Start Date End Date Taking? Authorizing Provider  acetaminophen (TYLENOL) 325 MG tablet Take 650 mg by mouth every 6 (six) hours as needed for mild pain.   Yes [provider]  amLODipine (NORVASC) 10 MG tablet TAKE 1 TABLET BY MOUTH EVERY DAY Patient taking differently: Take 10 mg by mouth daily. 01/30/21  Yes Laurey Morale, MD  aspirin 81 MG tablet Take 81 mg by mouth daily.   Yes [provider]  Calcium-Vitamin D (CALTRATE 600 PLUS-VIT D PO) Take 1 tablet by mouth daily.   Yes [provider]  hydrochlorothiazide (HYDRODIURIL) 25 MG tablet TAKE 1 TABLET BY MOUTH EVERY DAY Patient taking differently: Take 25 mg by mouth daily. 01/19/21  Yes Laurey Morale, MD  KLOR-CON M20 20 MEQ tablet TAKE 1 TABLET BY MOUTH EVERY DAY Patient taking differently: Take 20 mEq by mouth daily. 11/03/20  Yes Laurey Morale, MD  loratadine (CLARITIN) 10 MG tablet Take 1 tablet (10 mg total) by mouth daily. Patient taking differently: Take 10 mg by mouth daily as needed for allergies. 01/31/21  Yes Laurey Morale, MD  losartan (COZAAR) 50 MG tablet Take 1 tablet (50 mg total) by mouth daily. 02/26/21  Yes Laurey Morale, MD  meclizine (ANTIVERT) 25 MG tablet Take 1 tablet (25 mg total)  by mouth 3 (three) times daily as needed for dizziness. 06/15/17  Yes Lacretia Leigh, MD  Multiple Vitamins-Minerals (CENTRUM SILVER PO) Take 1 tablet by mouth daily.   Yes [provider]  omeprazole (PRILOSEC) 40 MG capsule Take 1 capsule (40 mg total) by mouth daily. Patient taking differently: Take 40 mg by mouth daily as needed (heartburn). 11/06/20  Yes Laurey Morale, MD  promethazine (PHENERGAN) 25 MG tablet Take 1 tablet (25 mg total) by mouth every 4 (four) hours as needed for nausea or vomiting. 03/22/19  Yes Laurey Morale, MD  vitamin C (ASCORBIC ACID) 500 MG tablet Take 500 mg by mouth  daily.   Yes [provider]    Allergies    Iodine and Latex  Review of Systems   Review of Systems  Respiratory:  Positive for shortness of breath.   All other systems reviewed and are negative.  Physical Exam Updated Vital Signs BP (!) 169/95   Pulse 73   Temp (!) 97.5 F (36.4 C) (Oral)   Resp 14   SpO2 98%   Physical Exam Vitals and nursing note reviewed.  Constitutional:      General: She is not in acute distress.    Appearance: She is well-developed. She is not diaphoretic.  HENT:     Head: Normocephalic and atraumatic.  Cardiovascular:     Rate and Rhythm: Normal rate and regular rhythm.     Heart sounds: No murmur heard.   No friction rub. No gallop.  Pulmonary:     Effort: Pulmonary effort is normal. No respiratory distress.     Breath sounds: Normal breath sounds. No wheezing.  Abdominal:     General: Bowel sounds are normal. There is no distension.     Palpations: Abdomen is soft.     Tenderness: There is no abdominal tenderness.  Musculoskeletal:        General: Normal range of motion.     Cervical back: Normal range of motion and neck supple.  Skin:    General: Skin is warm and dry.  Neurological:     General: No focal deficit present.     Mental Status: She is alert and oriented to person, place, and time.    ED Results / Procedures / Treatments   Labs (all labs ordered are listed, but only abnormal results are displayed) Labs Reviewed  BASIC METABOLIC PANEL - Abnormal; Notable for the following components:      Result Value   Potassium 3.4 (*)    Glucose, Bld 109 (*)    BUN <5 (*)    All other components within normal limits  CBC WITH DIFFERENTIAL/PLATELET - Abnormal; Notable for the following components:   Platelets 500 (*)    All other components within normal limits  RESP PANEL BY RT-PCR (FLU A&B, COVID) ARPGX2  BRAIN NATRIURETIC PEPTIDE  TROPONIN I (HIGH SENSITIVITY)  TROPONIN I (HIGH SENSITIVITY)    EKG EKG  Interpretation  Date/Time:  Friday March 23 2021 13:36:56 EDT Ventricular Rate:  80 PR Interval:  150 QRS Duration: 98 QT Interval:  388 QTC Calculation: 447 R Axis:   -48 Text Interpretation: Sinus rhythm with Premature atrial complexes Left anterior fascicular block Left ventricular hypertrophy Abnormal ECG Confirmed by Veryl Speak [phone]) on 03/24/2021 4:40:58 AM  Radiology DG Chest 2 View  Result Date: 03/23/2021 CLINICAL DATA:  Shortness of breath. EXAM: CHEST - 2 VIEW COMPARISON:  Feb 28, 2014. FINDINGS: The heart size and mediastinal contours are  within normal limits. Both lungs are clear. The visualized skeletal structures are unremarkable. IMPRESSION: No active cardiopulmonary disease. Electronically Signed   By: Marijo Conception M.D.   On: 03/23/2021 15:11    Procedures Procedures   Medications Ordered in ED Medications  azithromycin (ZITHROMAX) tablet 500 mg (has no administration in time range)  albuterol (VENTOLIN HFA) 108 (90 Base) MCG/ACT inhaler 2 puff (has no administration in time range)    ED Course  I have reviewed the triage vital signs and the nursing notes.  Pertinent labs & imaging results that were available during my care of the patient were reviewed by me and considered in my medical decision making (see chart for details).    MDM Rules/Calculators/A&P  Patient presenting here with a 1 month history of worsening cough, now productive, shortness of breath, and feeling unwell.  Patient's work-up reveals no evidence for a cardiac etiology.  Her chest x-ray is clear, BNP is normal, troponin x2 is negative, and EKG is unchanged.  Patient to be treated for acute bronchitis with Zithromax and albuterol.  She is to follow-up with her primary doctor later this week if not improving.  Final Clinical Impression(s) / ED Diagnoses Final diagnoses:  None    Rx / DC Orders ED Discharge Orders     None        Veryl Speak, MD 03/24/21 [phone]

## 2021-03-27 ENCOUNTER — Encounter (HOSPITAL_COMMUNITY): Payer: Self-pay | Admitting: Emergency Medicine

## 2021-03-27 ENCOUNTER — Other Ambulatory Visit: Payer: Self-pay

## 2021-03-27 ENCOUNTER — Ambulatory Visit (HOSPITAL_COMMUNITY)
Admission: EM | Admit: 2021-03-27 | Discharge: 2021-03-27 | Disposition: A | Discharge status: Home / Self Care | Payer: Medicare Other | Attending: Internal Medicine | Admitting: Internal Medicine

## 2021-03-27 ENCOUNTER — Ambulatory Visit (INDEPENDENT_AMBULATORY_CARE_PROVIDER_SITE_OTHER): Payer: Medicare Other

## 2021-03-27 DIAGNOSIS — M25561 Pain in right knee: Secondary | ICD-10-CM

## 2021-03-27 DIAGNOSIS — W19XXXA Unspecified fall, initial encounter: Secondary | ICD-10-CM

## 2021-03-27 DIAGNOSIS — S8991XA Unspecified injury of right lower leg, initial encounter: Secondary | ICD-10-CM | POA: Diagnosis not present

## 2021-03-27 DIAGNOSIS — M25562 Pain in left knee: Secondary | ICD-10-CM

## 2021-03-27 DIAGNOSIS — R0602 Shortness of breath: Secondary | ICD-10-CM

## 2021-03-27 NOTE — ED Provider Notes (Signed)
[facility]    CSN: [id number] Arrival date & time: 03/27/21  1327      History   Chief Complaint Chief Complaint  Patient presents with   Knee Pain    HPI Anne [name] is a 75 y.o. female presents urgent care today after fall.  Patient states she was attempting to pick up watermelon a grocery store when she fell onto knees.  She denies any dizziness or chest pain prior to fall no LOC.  She is uncertain if floor was slippery or she just lost balance.  She describes bilateral knee pain right greater than left and used golf club to help support her upon arrival to urgent care.   She also describes several week history of increasing shortness of breath.  Chart review shows she has been seen by PCP and in emergency department.  She states prednisone course prescribed by PCP helped with cough.  She was given azithromycin and albuterol for treatment of bronchitis.  She has had 2 doses of antibiotic.  States symptoms may be somewhat improved since then and are no worse.  Cardiac work-up at that time unremarkable, COVID PCR negative.  Again she denies any recent headache, dizziness, chest pain, palpitations, abdominal pain, N/V/D.  Chronic swelling to ankles is unchanged.    Past Medical History:  Diagnosis Date   Hyperlipidemia    Hypertension    Osteopenia    per DEXA on 07-19-09   Stroke [facility]) 09-2007   has mild left leg weakness, saw Dr. Brett Fairy    Patient Active Problem List   Diagnosis Date Noted   Bradycardia, unspecified 01/02/2018   Mallet deformity of fourth finger, right 02/02/2014   ALLERGIC RHINITIS 05/08/2010   DIZZINESS 02/01/2010   LIPOMA 08/11/2009   OSTEOPENIA 08/11/2009   SUPRAVENTRICULAR PREMATURE BEATS 07/28/2008   FATIGUE 06/16/2008   MYOSITIS 05/12/2008   HYPERLIPIDEMIA 11/24/2007   Essential hypertension 11/24/2007   History of cardiovascular disorder 11/24/2007    History reviewed. No pertinent surgical history.  OB History   No  obstetric history on file.      Home Medications    Prior to Admission medications   Medication Sig Start Date End Date Taking? Authorizing Provider  acetaminophen (TYLENOL) 325 MG tablet Take 650 mg by mouth every 6 (six) hours as needed for mild pain.    [provider]  amLODipine (NORVASC) 10 MG tablet TAKE 1 TABLET BY MOUTH EVERY DAY Patient taking differently: Take 10 mg by mouth daily. 01/30/21   Laurey Morale, MD  aspirin 81 MG tablet Take 81 mg by mouth daily.    [provider]  azithromycin (ZITHROMAX) 250 MG tablet Take 1 tablet (250 mg total) by mouth daily. 03/24/21   Veryl Speak, MD  Calcium-Vitamin D (CALTRATE 600 PLUS-VIT D PO) Take 1 tablet by mouth daily.    [provider]  hydrochlorothiazide (HYDRODIURIL) 25 MG tablet TAKE 1 TABLET BY MOUTH EVERY DAY Patient taking differently: Take 25 mg by mouth daily. 01/19/21   Laurey Morale, MD  KLOR-CON M20 20 MEQ tablet TAKE 1 TABLET BY MOUTH EVERY DAY Patient taking differently: Take 20 mEq by mouth daily. 11/03/20   Laurey Morale, MD  loratadine (CLARITIN) 10 MG tablet Take 1 tablet (10 mg total) by mouth daily. Patient taking differently: Take 10 mg by mouth daily as needed for allergies. 01/31/21   Laurey Morale, MD  losartan (COZAAR) 50 MG tablet Take 1 tablet (50 mg total) by  mouth daily. 02/26/21   Laurey Morale, MD  meclizine (ANTIVERT) 25 MG tablet Take 1 tablet (25 mg total) by mouth 3 (three) times daily as needed for dizziness. 06/15/17   Lacretia Leigh, MD  Multiple Vitamins-Minerals (CENTRUM SILVER PO) Take 1 tablet by mouth daily.    [provider]  omeprazole (PRILOSEC) 40 MG capsule Take 1 capsule (40 mg total) by mouth daily. Patient taking differently: Take 40 mg by mouth daily as needed (heartburn). 11/06/20   Laurey Morale, MD  promethazine (PHENERGAN) 25 MG tablet Take 1 tablet (25 mg total) by mouth every 4 (four) hours as needed for nausea or vomiting. 03/22/19   Laurey Morale, MD  vitamin C (ASCORBIC ACID) 500 MG tablet Take 500 mg by mouth daily.    [provider]    Family History Family History  Family history unknown: Yes    Social History Social History   Tobacco Use   Smoking status: Never   Smokeless tobacco: Never  Substance Use Topics   Alcohol use: Yes    Alcohol/week: 0.0 standard drinks    Comment: rare   Drug use: No     Allergies   Iodine and Latex   Review of Systems As stated in HPI otherwise negative   Physical Exam Triage Vital Signs ED Triage Vitals [03/27/21 1448]  Enc Vitals Group     BP (!) 147/73     Pulse Rate 72     Resp 16     Temp 98 F (36.7 C)     Temp Source Oral     SpO2 99 %     Weight      Height      Head Circumference      Peak Flow      Pain Score 6     Pain Loc      Pain Edu?      Excl. in [place]?    No data found.  Updated Vital Signs BP (!) 147/73 (BP Location: Right Arm)   Pulse 72   Temp 98 F (36.7 C) (Oral)   Resp 16   SpO2 99%   Visual Acuity Right Eye Distance:   Left Eye Distance:   Bilateral Distance:    Right Eye Near:   Left Eye Near:    Bilateral Near:     Physical Exam Constitutional:      General: She is not in acute distress.    Appearance: Normal appearance. She is not ill-appearing or toxic-appearing.  HENT:     Nose: No congestion or rhinorrhea.     Mouth/Throat:     Mouth: Mucous membranes are moist.  Eyes:     Extraocular Movements: Extraocular movements intact.     Conjunctiva/sclera: Conjunctivae normal.  Neck:     Vascular: No carotid bruit.  Cardiovascular:     Rate and Rhythm: Normal rate and regular rhythm.     Heart sounds: Murmur heard.  Pulmonary:     Effort: Pulmonary effort is normal.     Breath sounds: Normal breath sounds. No wheezing, rhonchi or rales.  Abdominal:     General: Bowel sounds are normal.     Palpations: Abdomen is soft.  Musculoskeletal:        General: Tenderness present. No swelling or  deformity. Normal range of motion.     Cervical back: Normal range of motion and neck supple. No rigidity.     Comments: Tender to palpation on right distal  femur and patellar region.  Mild bruising noted just below patellar region on lateral aspect of leg  Skin:    General: Skin is warm and dry.  Neurological:     General: No focal deficit present.     Mental Status: She is alert and oriented to person, place, and time.     Motor: No weakness.     Gait: Gait normal.  Psychiatric:        Mood and Affect: Mood normal.        Behavior: Behavior normal.     UC Treatments / Results  Labs (all labs ordered are listed, but only abnormal results are displayed) Labs Reviewed - No data to display  EKG   Radiology No results found.  Procedures Procedures (including critical care time)  Medications Ordered in UC Medications - No data to display  Initial Impression / Assessment and Plan / UC Course  I have reviewed the triage vital signs and the nursing notes.  Pertinent labs & imaging results that were available during my care of the patient were reviewed by me and considered in my medical decision making (see chart for details).  Fall Knee pain -Pain is actually improved since time of fall.  Slight limp with walk and using golf club to stabilize. -X-ray negative for fracture -Ice, Tylenol as needed for pain, cane for stability as needed  Dyspnea -Ongoing for over a month and slightly improved with recent prednisone prescribed by PCP and is a and albuterol Rx.d in ED. patient states symptoms flared after walking outside in the wind and pollen -Full work-up has ruled out ACS -Patient with known murmur that is audible on exam.  Last 2D echo done in 2019 -Complete course of medication given in ED.  Follow-up with PCP to consider repeat echo -Return to ER for any worsening shortness of breath or development of new symptoms  Reviewed expections re: course of current medical issues.  Questions answered. Outlined signs and symptoms indicating need for more acute intervention. Pt verbalized understanding. AVS given  Final Clinical Impressions(s) / UC Diagnoses   Final diagnoses:  Acute pain of both knees  Fall, initial encounter  Shortness of breath   Discharge Instructions   None    ED Prescriptions   None    PDMP not reviewed this encounter.   Rudolpho Sevin, NP 03/27/21 1553

## 2021-03-27 NOTE — Discharge Instructions (Addendum)
Your x-ray today does not show any fracture.  I recommend going to medical supply store to buy a cane if needed.  Ice on knee several times daily for 15 to 20 minutes at a time.  Take Tylenol as needed for pain.  In regards to your shortness of breath, finished medication prescribed in the emergency department.  If symptoms persist I would follow-up with your primary care doctor to determine next steps.  Should your symptoms worsen to develop any chest pain you would need to return to the emergency department.

## 2021-03-27 NOTE — ED Triage Notes (Signed)
Patient presents to [facility] for evaluation after falling forward on to her hands and knees at the grocery store trying to pick up a watermelon.  C/o bilateral elbow and knee pain after the fall, states it all has mostly resolved at this time

## 2021-03-28 ENCOUNTER — Telehealth: Payer: Self-pay | Admitting: *Deleted

## 2021-03-28 NOTE — Telephone Encounter (Signed)
Pt called regarding which pharmacy Rx was e-scribed to.  RNCM reviewed chart to access After Visit Summary and found that Rx was printed.  Pt could not locate printed Rx; RNCM called in Rx as written.  Advised pt to pick up in 2 hours.

## 2021-04-11 DIAGNOSIS — Z20822 Contact with and (suspected) exposure to covid-19: Secondary | ICD-10-CM | POA: Diagnosis not present

## 2021-04-23 ENCOUNTER — Other Ambulatory Visit: Payer: Self-pay

## 2021-04-23 ENCOUNTER — Encounter: Payer: Self-pay | Admitting: Family Medicine

## 2021-04-23 ENCOUNTER — Ambulatory Visit (INDEPENDENT_AMBULATORY_CARE_PROVIDER_SITE_OTHER): Payer: Medicare Other | Admitting: Family Medicine

## 2021-04-23 ENCOUNTER — Ambulatory Visit: Payer: Medicare Other | Admitting: Family Medicine

## 2021-04-23 VITALS — BP 120/70 | HR 63 | Temp 97.2°F | Wt 166.0 lb

## 2021-04-23 DIAGNOSIS — R0602 Shortness of breath: Secondary | ICD-10-CM | POA: Diagnosis not present

## 2021-04-23 DIAGNOSIS — S8001XD Contusion of right knee, subsequent encounter: Secondary | ICD-10-CM

## 2021-04-23 NOTE — Progress Notes (Signed)
   Subjective:    Patient ID: Anne [name], female    DOB: 02-12-1946, 75 y.o.   MRN: [id number]  HPI Here to follow up an urgent care visit on 03-27-21 for knee pain after a fall. That day she was in a grocery store and she attempted to lean forward to pick up a watermelon out of a crate. She apparently lost her balance and fell forward, landing on her knees. She immediately had pain in both knees, right worse than left. No swelling. At the UC her exam showed some anterior tenderness and Xrays of the right knee were normal. She was told she had a bruise, and she could treat it with ice and Tylenol as needed. Since then her knee has felt better very day. She also talked to them about the SOB she has been experiencing for the past 6 weeks. We had treated her for bronchitis with antibiotics and Prednisone. The cough has completely resolved but the mild intermittent SOB persists. At the urgent care her CXR was clear. She was given an albuterol inhaler to try but this has not helped. She had an ECHO in 2019 which was unremarkable. No chest pain.    Review of Systems  Constitutional: Negative.   Respiratory:  Positive for shortness of breath. Negative for cough and wheezing.   Cardiovascular: Negative.   Musculoskeletal:  Positive for arthralgias.      Objective:   Physical Exam Constitutional:      Appearance: Normal appearance.     Comments: Walks comfortably   Cardiovascular:     Rate and Rhythm: Normal rate and regular rhythm.     Pulses: Normal pulses.     Heart sounds: Normal heart sounds.  Pulmonary:     Effort: Pulmonary effort is normal. No respiratory distress.     Breath sounds: Normal breath sounds. No stridor. No wheezing, rhonchi or rales.  Musculoskeletal:     Right lower leg: No edema.     Left lower leg: No edema.     Comments: Right knee is normal with no swelling or tenderness. ROM is full   Neurological:     Mental Status: She is alert.          Assessment &  Plan:  Knee contusion, this seems to be healing as expected. The etiology of the SOB remains elusive. We will refer her to Cardiology to evaluate. Alysia Penna, MD

## 2021-05-04 ENCOUNTER — Ambulatory Visit: Payer: Medicare Other | Attending: Internal Medicine

## 2021-05-04 DIAGNOSIS — Z23 Encounter for immunization: Secondary | ICD-10-CM

## 2021-05-04 NOTE — Progress Notes (Signed)
   Covid-19 Vaccination Clinic  Name:  Anne [name]    MRN: [id number] DOB: [birth year]  05/04/2021  Ms. Dimodica was observed post Covid-19 immunization for 15 minutes without incident. She was provided with Vaccine Information Sheet and instruction to access the V-Safe system.   Ms. Sankovich was instructed to call 911 with any severe reactions post vaccine: Difficulty breathing  Swelling of face and throat  A fast heartbeat  A bad rash all over body  Dizziness and weakness   Immunizations Administered     Name Date Dose VIS Date Route   Moderna Covid-19 Booster Vaccine 05/04/2021 10:44 AM 0.25 mL 08/02/2020 Intramuscular   Manufacturer: Moderna   Lot: [id number]   [id number]

## 2021-05-07 ENCOUNTER — Other Ambulatory Visit: Payer: Self-pay

## 2021-05-07 MED ORDER — MODERNA COVID-19 VACCINE 100 MCG/0.5ML IM SUSP
INTRAMUSCULAR | 0 refills | Status: DC
  Filled 2021-05-07: qty 0.25, 10d supply, fill #0

## 2021-05-21 ENCOUNTER — Other Ambulatory Visit: Payer: Self-pay | Admitting: Family Medicine

## 2021-06-30 ENCOUNTER — Emergency Department (HOSPITAL_COMMUNITY)
Admission: EM | Admit: 2021-06-30 | Discharge: 2021-07-01 | Disposition: A | Discharge status: Home / Self Care | Payer: Medicare Other | Attending: Emergency Medicine | Admitting: Emergency Medicine

## 2021-06-30 ENCOUNTER — Encounter (HOSPITAL_COMMUNITY): Payer: Self-pay

## 2021-06-30 DIAGNOSIS — R42 Dizziness and giddiness: Secondary | ICD-10-CM | POA: Diagnosis not present

## 2021-06-30 DIAGNOSIS — D72829 Elevated white blood cell count, unspecified: Secondary | ICD-10-CM | POA: Insufficient documentation

## 2021-06-30 DIAGNOSIS — R112 Nausea with vomiting, unspecified: Secondary | ICD-10-CM

## 2021-06-30 DIAGNOSIS — Z91040 Latex allergy status: Secondary | ICD-10-CM | POA: Insufficient documentation

## 2021-06-30 DIAGNOSIS — Z7982 Long term (current) use of aspirin: Secondary | ICD-10-CM | POA: Insufficient documentation

## 2021-06-30 DIAGNOSIS — Z79899 Other long term (current) drug therapy: Secondary | ICD-10-CM | POA: Insufficient documentation

## 2021-06-30 DIAGNOSIS — I10 Essential (primary) hypertension: Secondary | ICD-10-CM | POA: Diagnosis not present

## 2021-06-30 NOTE — ED Triage Notes (Signed)
Pt came in with c/o dizziness, nausea and L arm pain. Dizziness episode happened approx one hour ago. Pt took her BP at home and it was elevated. Pt has not taken BP meds today. Pt BP is 191/79. Endorses nausea and L arm pain

## 2021-07-01 ENCOUNTER — Emergency Department (HOSPITAL_COMMUNITY): Payer: Medicare Other

## 2021-07-01 DIAGNOSIS — R112 Nausea with vomiting, unspecified: Secondary | ICD-10-CM | POA: Diagnosis not present

## 2021-07-01 DIAGNOSIS — R42 Dizziness and giddiness: Secondary | ICD-10-CM | POA: Diagnosis not present

## 2021-07-01 LAB — CBC WITH DIFFERENTIAL/PLATELET
Abs Immature Granulocytes: 0.07 10*3/uL (ref 0.00–0.07)
Basophils Absolute: 0.1 10*3/uL (ref 0.0–0.1)
Basophils Relative: 1 %
Eosinophils Absolute: 0.1 10*3/uL (ref 0.0–0.5)
Eosinophils Relative: 1 %
HCT: 45.2 % (ref 36.0–46.0)
Hemoglobin: 15.1 g/dL — ABNORMAL HIGH (ref 12.0–15.0)
Immature Granulocytes: 1 %
Lymphocytes Relative: 11 %
Lymphs Abs: 1.6 10*3/uL (ref 0.7–4.0)
MCH: 30.2 pg (ref 26.0–34.0)
MCHC: 33.4 g/dL (ref 30.0–36.0)
MCV: 90.4 fL (ref 80.0–100.0)
Monocytes Absolute: 0.8 10*3/uL (ref 0.1–1.0)
Monocytes Relative: 5 %
Neutro Abs: 12.5 10*3/uL — ABNORMAL HIGH (ref 1.7–7.7)
Neutrophils Relative %: 81 %
Platelets: 454 10*3/uL — ABNORMAL HIGH (ref 150–400)
RBC: 5 MIL/uL (ref 3.87–5.11)
RDW: 13.2 % (ref 11.5–15.5)
WBC: 15.1 10*3/uL — ABNORMAL HIGH (ref 4.0–10.5)
nRBC: 0 % (ref 0.0–0.2)

## 2021-07-01 LAB — URINALYSIS, ROUTINE W REFLEX MICROSCOPIC
Bacteria, UA: NONE SEEN
Bilirubin Urine: NEGATIVE
Glucose, UA: NEGATIVE mg/dL
Hgb urine dipstick: NEGATIVE
Ketones, ur: NEGATIVE mg/dL
Leukocytes,Ua: NEGATIVE
Nitrite: NEGATIVE
Protein, ur: NEGATIVE mg/dL
Specific Gravity, Urine: 1.01 (ref 1.005–1.030)
pH: 8 (ref 5.0–8.0)

## 2021-07-01 LAB — COMPREHENSIVE METABOLIC PANEL
ALT: 26 U/L (ref 0–44)
AST: 49 U/L — ABNORMAL HIGH (ref 15–41)
Albumin: 4.5 g/dL (ref 3.5–5.0)
Alkaline Phosphatase: 81 U/L (ref 38–126)
Anion gap: 12 (ref 5–15)
BUN: 15 mg/dL (ref 8–23)
CO2: 27 mmol/L (ref 22–32)
Calcium: 9.1 mg/dL (ref 8.9–10.3)
Chloride: 96 mmol/L — ABNORMAL LOW (ref 98–111)
Creatinine, Ser: 0.82 mg/dL (ref 0.44–1.00)
GFR, Estimated: >60 mL/min (ref >60)
Glucose, Bld: 127 mg/dL — ABNORMAL HIGH (ref 70–99)
Potassium: 4.5 mmol/L (ref 3.5–5.1)
Sodium: 135 mmol/L (ref 135–145)
Total Bilirubin: 0.8 mg/dL (ref 0.3–1.2)
Total Protein: 8.5 g/dL — ABNORMAL HIGH (ref 6.5–8.1)

## 2021-07-01 LAB — TROPONIN I (HIGH SENSITIVITY): Troponin I (High Sensitivity): 14 ng/L (ref <18)

## 2021-07-01 MED ORDER — ONDANSETRON 4 MG PO TBDP
4.0000 mg | ORAL_TABLET | Freq: Once | ORAL | Status: AC
  Administered 2021-07-01: 4 mg via ORAL
  Filled 2021-07-01: qty 1

## 2021-07-01 MED ORDER — ONDANSETRON HCL 4 MG/2ML IJ SOLN: 4.0000 mg | Freq: Once | INTRAMUSCULAR | Status: DC

## 2021-07-01 NOTE — ED Provider Notes (Signed)
[facility] DEPT Provider Note: Georgena Spurling, MD, FACEP  CSN: [id number] MRN: [id number] ARRIVAL: 06/30/21 at 2337 ROOM: [place]  Dizziness   HISTORY OF PRESENT ILLNESS  07/01/21 12:57 AM Anne [name] is a 75 y.o. female who developed lightheadedness and nausea yesterday evening about 9 PM.  She subsequently had 1 episode of vomiting which relieved her nausea.  She is now having no lightheadedness or nausea but does feel like she needs to empty her stomach.  She has been having some mild (2 out of 10) pain in her left shoulder on awakening in the mornings which she attributes to sleeping on a "funny" pillow.  She is having no arm pain at the present time.  She is having no shortness of breath at the present time.   Past Medical History:  Diagnosis Date   Hyperlipidemia    Hypertension    Osteopenia    per DEXA on 07-19-09   Stroke ([place]) 09-2007   has mild left leg weakness, saw Dr. Brett Fairy    No past surgical history on file.  Family History  Family history unknown: Yes    Social History   Tobacco Use   Smoking status: Never   Smokeless tobacco: Never  Vaping Use   Vaping Use: Never used  Substance Use Topics   Alcohol use: Not Currently    Comment: rare   Drug use: No    Prior to Admission medications   Medication Sig Start Date End Date Taking? Authorizing Provider  acetaminophen (TYLENOL) 325 MG tablet Take 650 mg by mouth every 6 (six) hours as needed for mild pain.    [provider]  amLODipine (NORVASC) 10 MG tablet TAKE 1 TABLET BY MOUTH EVERY DAY Patient taking differently: Take 10 mg by mouth daily. 01/30/21   Laurey Morale, MD  aspirin 81 MG tablet Take 81 mg by mouth daily.    [provider]  Calcium-Vitamin D (CALTRATE 600 PLUS-VIT D PO) Take 1 tablet by mouth daily.    [provider]  COVID-19 mRNA vaccine, Moderna, (MODERNA COVID-19 VACCINE) 100 MCG/0.5ML injection Inject into the muscle.  05/04/21   Carlyle Basques, MD  hydrochlorothiazide (HYDRODIURIL) 25 MG tablet TAKE 1 TABLET BY MOUTH EVERY DAY Patient taking differently: Take 25 mg by mouth daily. 01/19/21   Laurey Morale, MD  KLOR-CON M20 20 MEQ tablet TAKE 1 TABLET BY MOUTH EVERY DAY Patient taking differently: Take 20 mEq by mouth daily. 11/03/20   Laurey Morale, MD  loratadine (CLARITIN) 10 MG tablet Take 1 tablet (10 mg total) by mouth daily. Patient taking differently: Take 10 mg by mouth daily as needed for allergies. 01/31/21   Laurey Morale, MD  losartan (COZAAR) 50 MG tablet TAKE 1 TABLET BY MOUTH EVERY DAY 05/22/21   Laurey Morale, MD  meclizine (ANTIVERT) 25 MG tablet Take 1 tablet (25 mg total) by mouth 3 (three) times daily as needed for dizziness. 06/15/17   Lacretia Leigh, MD  Multiple Vitamins-Minerals (CENTRUM SILVER PO) Take 1 tablet by mouth daily.    [provider]  omeprazole (PRILOSEC) 40 MG capsule Take 1 capsule (40 mg total) by mouth daily. Patient taking differently: Take 40 mg by mouth daily as needed (heartburn). 11/06/20   Laurey Morale, MD  promethazine (PHENERGAN) 25 MG tablet Take 1 tablet (25 mg total) by mouth every 4 (four) hours as needed for nausea or vomiting. 03/22/19   Laurey Morale,  MD  vitamin C (ASCORBIC ACID) 500 MG tablet Take 500 mg by mouth daily.    [provider]    Allergies Iodine and Latex   REVIEW OF SYSTEMS  Negative except as noted here or in the History of Present Illness.   PHYSICAL EXAMINATION  Initial Vital Signs Blood pressure (!) 186/79, pulse 70, temperature 97.6 F (36.4 C), temperature source Oral, resp. rate (!) 22, height '5\' 2"'$  (1.575 m), weight 72.6 kg, SpO2 98 %.  Examination General: Well-developed, well-nourished female in no acute distress; appearance consistent with age of record HENT: normocephalic; atraumatic Eyes: pupils equal, round and reactive to light; extraocular muscles intact Neck: supple Heart: regular rate and  rhythm Lungs: clear to auscultation bilaterally Abdomen: soft; nondistended; nontender; bowel sounds present Extremities: No deformity; full range of motion; pulses normal; no left shoulder tenderness or pain on range of motion Neurologic: Awake, alert and oriented; motor function intact in all extremities and symmetric; no facial droop Skin: Warm and dry Psychiatric: Normal mood and affect   RESULTS  Summary of this visit's results, reviewed and interpreted by myself:   EKG Interpretation  Date/Time:  Sunday July 01 2021 00:50:00 EDT Ventricular Rate:  62 PR Interval:  176 QRS Duration: 106 QT Interval:  454 QTC Calculation: 462 R Axis:   -44 Text Interpretation: Sinus rhythm Atrial premature complexes Abnormal R-wave progression, early transition Left ventricular hypertrophy No significant change was found Confirmed by Demon Volante, Jenny Reichmann [id number]) on 07/01/2021 12:55:22 AM       Laboratory Studies: Results for orders placed or performed during the hospital encounter of 06/30/21 (from the past 24 hour(s))  Troponin I (High Sensitivity)     Status: None   Collection Time: 07/01/21 12:49 AM  Result Value Ref Range   Troponin I (High Sensitivity) 14 <18 ng/L  Comprehensive metabolic panel     Status: Abnormal   Collection Time: 07/01/21 12:49 AM  Result Value Ref Range   Sodium 135 135 - 145 mmol/L   Potassium 4.5 3.5 - 5.1 mmol/L   Chloride 96 (L) 98 - 111 mmol/L   CO2 27 22 - 32 mmol/L   Glucose, Bld 127 (H) 70 - 99 mg/dL   BUN 15 8 - 23 mg/dL   Creatinine, Ser 0.82 0.44 - 1.00 mg/dL   Calcium 9.1 8.9 - 10.3 mg/dL   Total Protein 8.5 (H) 6.5 - 8.1 g/dL   Albumin 4.5 3.5 - 5.0 g/dL   AST 49 (H) 15 - 41 U/L   ALT 26 0 - 44 U/L   Alkaline Phosphatase 81 38 - 126 U/L   Total Bilirubin 0.8 0.3 - 1.2 mg/dL   GFR, Estimated >60 >60 mL/min   Anion gap 12 5 - 15  CBC with Differential/Platelet     Status: Abnormal   Collection Time: 07/01/21 12:49 AM  Result Value Ref Range    WBC 15.1 (H) 4.0 - 10.5 K/uL   RBC 5.00 3.87 - 5.11 MIL/uL   Hemoglobin 15.1 (H) 12.0 - 15.0 g/dL   HCT 45.2 36.0 - 46.0 %   MCV 90.4 80.0 - 100.0 fL   MCH 30.2 26.0 - 34.0 pg   MCHC 33.4 30.0 - 36.0 g/dL   RDW 13.2 11.5 - 15.5 %   Platelets 454 (H) 150 - 400 K/uL   nRBC 0.0 0.0 - 0.2 %   Neutrophils Relative % 81 %   Neutro Abs 12.5 (H) 1.7 - 7.7 K/uL   Lymphocytes Relative 11 %  Lymphs Abs 1.6 0.7 - 4.0 K/uL   Monocytes Relative 5 %   Monocytes Absolute 0.8 0.1 - 1.0 K/uL   Eosinophils Relative 1 %   Eosinophils Absolute 0.1 0.0 - 0.5 K/uL   Basophils Relative 1 %   Basophils Absolute 0.1 0.0 - 0.1 K/uL   Immature Granulocytes 1 %   Abs Immature Granulocytes 0.07 0.00 - 0.07 K/uL  Urinalysis, Routine w reflex microscopic Urine, Clean Catch     Status: Abnormal   Collection Time: 07/01/21  1:24 AM  Result Value Ref Range   Color, Urine YELLOW (A) YELLOW   APPearance CLEAR (A) CLEAR   Specific Gravity, Urine 1.010 1.005 - 1.030   pH 8.0 5.0 - 8.0   Glucose, UA NEGATIVE NEGATIVE mg/dL   Hgb urine dipstick NEGATIVE NEGATIVE   Bilirubin Urine NEGATIVE NEGATIVE   Ketones, ur NEGATIVE NEGATIVE mg/dL   Protein, ur NEGATIVE NEGATIVE mg/dL   Nitrite NEGATIVE NEGATIVE   Leukocytes,Ua NEGATIVE NEGATIVE   RBC / HPF 0-5 0 - 5 RBC/hpf   WBC, UA 0-5 0 - 5 WBC/hpf   Bacteria, UA NONE SEEN NONE SEEN   Squamous Epithelial / LPF 0-5 0 - 5   Imaging Studies: DG Chest 2 View  Result Date: 07/01/2021 CLINICAL DATA:  Left arm pain, dizziness EXAM: CHEST - 2 VIEW COMPARISON:  03/23/2021 FINDINGS: The heart size and mediastinal contours are within normal limits. Both lungs are clear. The visualized skeletal structures are unremarkable. IMPRESSION: No active cardiopulmonary disease. Electronically Signed   By: Merilyn Baba M.D.   On: 07/01/2021 00:21    ED COURSE and MDM  Nursing notes, initial and subsequent vitals signs, including pulse oximetry, reviewed and interpreted by  myself.  Vitals:   07/01/21 0000 07/01/21 0047 07/01/21 0100 07/01/21 0130  BP: (!) 193/78 (!) 186/79 (!) 172/84 (!) 166/73  Pulse: 69 70 66 61  Resp: 17 (!) '22 12 18  '$ Temp:      TempSrc:      SpO2: 98% 98% 96% 98%  Weight:      Height:       Medications  ondansetron (ZOFRAN) injection 4 mg (has no administration in time range)   2:07 AM Apart from the patient's leukocytosis her laboratory studies are reassuring.  There is no evidence of an acute coronary event, pneumonia or urinary tract infection.  I do not believe further work-up or hospitalization is indicated at this time.  She had been she did not take her antihypertensives yesterday evening.   PROCEDURES  Procedures   ED DIAGNOSES     ICD-10-CM   1. Nausea and vomiting in adult  R11.2     2. Lightheadedness  R42          Dhillon Comunale, Jenny Reichmann, MD 07/01/21 [phone]

## 2021-07-02 ENCOUNTER — Other Ambulatory Visit: Payer: Self-pay

## 2021-07-03 ENCOUNTER — Encounter: Payer: Self-pay | Admitting: Family Medicine

## 2021-07-03 ENCOUNTER — Ambulatory Visit (INDEPENDENT_AMBULATORY_CARE_PROVIDER_SITE_OTHER): Payer: Medicare Other | Admitting: Family Medicine

## 2021-07-03 VITALS — BP 158/82 | HR 66 | Temp 98.1°F | Wt 165.0 lb

## 2021-07-03 DIAGNOSIS — R42 Dizziness and giddiness: Secondary | ICD-10-CM

## 2021-07-03 DIAGNOSIS — I10 Essential (primary) hypertension: Secondary | ICD-10-CM | POA: Diagnosis not present

## 2021-07-03 MED ORDER — MECLIZINE HCL 25 MG PO TABS: 25.0000 mg | ORAL_TABLET | ORAL | 2 refills | Status: DC | PRN

## 2021-07-03 NOTE — Progress Notes (Signed)
   Subjective:    Patient ID: Anne [name], female    DOB: 07-Mar-1946, 75 y.o.   MRN: [id number]  HPI Here to follow up an urgent care visit on 06-30-21 for lightheadedness, nausea, and vomiting. Labs were normal. CXR was normal. EKG showed no change from baseline. She was given IV fluids and sent home. Since then she has continued to have spells of dizziness which are made worse when she moves her head quickly. No headache or chest pain or SOB. She had a similar episode in 2018 and she ended up getting a brain MRI. This showed some old infarcts but no acute changes. We had referred her to Cardiology, and she is scheduled to see Dr. Daneen Schick on 07-09-21.   Review of Systems  Constitutional: Negative.   Respiratory: Negative.    Cardiovascular: Negative.   Neurological:  Positive for dizziness. Negative for tremors, seizures, syncope, facial asymmetry, speech difficulty, weakness, light-headedness, numbness and headaches.      Objective:   Physical Exam Constitutional:      Comments: Slightly unsteady on her feet   Neck:     Vascular: No carotid bruit.  Cardiovascular:     Rate and Rhythm: Normal rate and regular rhythm.     Pulses: Normal pulses.     Comments: Has her usual 2/6 SM  Pulmonary:     Effort: Pulmonary effort is normal.     Breath sounds: Normal breath sounds.  Neurological:     Mental Status: She is alert and oriented to person, place, and time.          Assessment & Plan:  Her dizziness is consistent with vertigo of vestibular origin. She will drink fluids and try Meclizine as needed. We will set up carotid dopplers asap. She will see Dr. Tamala Julian next week as above. We spent a total of ( 32  ) minutes reviewing records and discussing these issues.  Alysia Penna, MD

## 2021-07-05 ENCOUNTER — Encounter (HOSPITAL_COMMUNITY): Payer: Medicare Other

## 2021-07-07 NOTE — Progress Notes (Signed)
Cardiology Office Note:    Date:  07/09/2021   ID:  Anne [name], DOB November 27, 1945, MRN [id number]  PCP:  Laurey Morale, MD  Cardiologist:  None   Referring MD: Laurey Morale, MD   Chief Complaint  Patient presents with   Cardiac Valve Problem    Aortic stenosis     History of Present Illness:    Anne [name] is a 75 y.o. female with a hx of recent recurrent lightheadedness, nausea, prediabetes, and vomiting associated with change of position and quick movement..   Referred by her primary physician.  There is concern about her breathing.  The patient describes it as "shallow breathing".  She specifically states that she can be breathing and she will have a hiccup like occurrence that lasts less than a second and goes away.  She mentioned this to her primary and says that it led to this office evaluation.  She denies chest discomfort, orthopnea, PND, lower extremity swelling.  She did receive evaluation in the emergency room in conjunction with nausea, vomiting, and dizziness/near syncope.  No significant abnormalities were identified.  There is a family history of vascular disease.  Past Medical History:  Diagnosis Date   Hyperlipidemia    Hypertension    Osteopenia    per DEXA on 07-19-09   Stroke ([place]) 09-2007   has mild left leg weakness, saw Dr. Brett Fairy    History reviewed. No pertinent surgical history.  Current Medications: Current Meds  Medication Sig   acetaminophen (TYLENOL) 325 MG tablet Take 650 mg by mouth every 6 (six) hours as needed for mild pain.   amLODipine (NORVASC) 10 MG tablet TAKE 1 TABLET BY MOUTH EVERY DAY   aspirin 81 MG tablet Take 81 mg by mouth daily.   Calcium-Vitamin D (CALTRATE 600 PLUS-VIT D PO) Take 1 tablet by mouth daily.   COVID-19 mRNA vaccine, Moderna, (MODERNA COVID-19 VACCINE) 100 MCG/0.5ML injection Inject into the muscle.   hydrochlorothiazide (HYDRODIURIL) 25 MG tablet TAKE 1 TABLET BY MOUTH EVERY DAY   ibuprofen  (ADVIL) 800 MG tablet Take 800 mg by mouth every 6 (six) hours as needed.   KLOR-CON M20 20 MEQ tablet TAKE 1 TABLET BY MOUTH EVERY DAY   loratadine (CLARITIN) 10 MG tablet Take 1 tablet (10 mg total) by mouth daily.   losartan (COZAAR) 50 MG tablet TAKE 1 TABLET BY MOUTH EVERY DAY   meclizine (ANTIVERT) 25 MG tablet Take 1 tablet (25 mg total) by mouth every 4 (four) hours as needed for dizziness.   Multiple Vitamins-Minerals (CENTRUM SILVER PO) Take 1 tablet by mouth daily.   omeprazole (PRILOSEC) 40 MG capsule Take 1 capsule (40 mg total) by mouth daily.   promethazine (PHENERGAN) 25 MG tablet Take 1 tablet (25 mg total) by mouth every 4 (four) hours as needed for nausea or vomiting.   simvastatin (ZOCOR) 20 MG tablet Take 20 mg by mouth daily.   vitamin C (ASCORBIC ACID) 500 MG tablet Take 500 mg by mouth daily.     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  Tobacco Use   Smoking status: Never   Smokeless tobacco: Never  Vaping Use  Vaping Use: Never used  Substance and Sexual Activity   Alcohol use: Not Currently    Comment: rare   Drug use: No   Sexual activity: Not Currently  Other Topics Concern   Not on file  Social History Narrative   Not on file   Social Determinants of Health   Financial Resource Strain: Low Risk    Difficulty of Paying Living Expenses: Not hard at all  Food Insecurity: No Food Insecurity   Worried About Charity fundraiser in the Last Year: Never true   [place] in the Last Year: Never true  Transportation Needs: No Transportation Needs   Lack of Transportation (Medical): No   Lack of Transportation (Non-Medical): No  Physical Activity: Insufficiently Active   Days of Exercise per Week: 2  days   Minutes of Exercise per Session: 30 min  Stress: Stress Concern Present   Feeling of Stress : To some extent  Social Connections: Moderately Isolated   Frequency of Communication with Friends and Family: More than three times a week   Frequency of Social Gatherings with Friends and Family: Never   Attends Religious Services: 1 to 4 times per year   Active Member of [employer] or Organizations: No   Attends Archivist Meetings: Never   Marital Status: Never married     Family History: The patient's Family history is unknown by patient.  ROS:   Please see the history of present illness.    Prediabetes based upon a hemoglobin A1c of 6.3, LDL 156 June 2022.  All other systems reviewed and are negative.  EKGs/Labs/Other Studies Reviewed:    The following studies were reviewed today:  2018 BRAIN MRI: IMPRESSION: 1.  No acute intracranial abnormality. 2. Mild progression of chronic small vessel ischemia since 2008. 3. Chronic intracranial artery dolichoectasia. 4. Chronic mild ventriculomegaly which has not significantly changed  ECHOCARDIOGRAM 2019: Study Conclusions   - Left ventricle: The cavity size was normal. Systolic function was    normal. The estimated ejection fraction was in the range of 60%    to 65%. Wall motion was normal; there were no regional wall    motion abnormalities. There was an increased relative    contribution of atrial contraction to ventricular filling.    Doppler parameters are consistent with abnormal left ventricular    relaxation (grade 1 diastolic dysfunction).  - Aortic valve: Trileaflet; mildly thickened, moderately calcified    leaflets. There was trivial regurgitation. Valve area (VTI): 1.91    cm^2. Valve area (Vmax): 1.68 cm^2. Valve area (Vmean): 1.69    cm^2.  - Mitral valve: Calcified annulus. There was trivial regurgitation.  - Right atrium: The atrium was mildly dilated.  - Pulmonic valve: There was trivial regurgitation.   - Pulmonary arteries: PA peak pressure: 51 mm Hg (S).  - Inferior vena cava: The vessel was mildly dilated.   Impressions:   - The right ventricular systolic pressure was increased consistent    with moderate pulmonary hypertension.   EKG:  EKG performed during the emergency room visit on 06/30/2021 demonstrated sinus rhythm, left ventricular hypertrophy, and premature atrial beats.  Recent Labs: 03/19/2021: TSH 2.96 03/23/2021: B Natriuretic Peptide 32.3 07/01/2021: ALT 26; BUN 15; Creatinine, Ser 0.82; Hemoglobin 15.1; Platelets 454; Potassium 4.5; Sodium 135  Recent Lipid Panel    Component Value Date/Time   CHOL 234 (H) 03/19/2021 1058   TRIG 146.0 03/19/2021 1058   HDL 49.20 03/19/2021 1058   CHOLHDL  5 03/19/2021 1058   VLDL 29.2 03/19/2021 1058   LDLCALC 156 (H) 03/19/2021 1058   LDLDIRECT 158.7 06/04/2013 1024    Physical Exam:    VS:  BP 128/72   Pulse 67   Ht 5\' 2"  (1.575 m)   Wt 164 lb 9.6 oz (74.7 kg)   SpO2 98%   BMI 30.11 kg/m     Wt Readings from Last 3 Encounters:  07/09/21 164 lb 9.6 oz (74.7 kg)  07/03/21 165 lb (74.8 kg)  06/30/21 160 lb (72.6 kg)     GEN: Slightly overweight. No acute distress HEENT: Normal NECK: Bilateral transmitted carotid bruit.  No JVD. LYMPHATICS: No lymphadenopathy CARDIAC: 3/6 crescendo decrescendo systolic murmur right upper sternal border and along the left mid sternal border.  A soft 1/6 decrescendo diastolic aortic regurgitation murmur.  Is present.  RRR no gallop, or edema. VASCULAR:  Normal Pulses. No bruits. RESPIRATORY:  Clear to auscultation without rales, wheezing or rhonchi  ABDOMEN: Soft, non-tender, non-distended, No pulsatile mass, MUSCULOSKELETAL: No deformity  SKIN: Warm and dry NEUROLOGIC:  Alert and oriented x 3 PSYCHIATRIC:  Normal affect   ASSESSMENT:    1. Near syncope   2. Aortic valve stenosis, etiology of cardiac valve disease unspecified   3. Mixed hyperlipidemia   4. Essential hypertension    5. Supraventricular premature beats   6. Bradycardia, unspecified    PLAN:    In order of problems listed above:  In association with nausea and vomiting of uncertain significance.  In the setting, vasovagal dizziness is the leading possibility. Clinical exam is suggestive of at least mild aortic stenosis.  Prior echo suggests the beginnings of aortic valve calcific disease.  Plan 2D Doppler echocardiogram to update current status. Lipids are not being managed.  Zocor is on her list of medicines but she never started taking it.  The most recent LDL is 156 and June 2022.  We will get a coronary calcium score to assess CV risk. Target blood pressure 130/80.  Currently in good condition however, may be masked by aortic stenosis. Present.   Natural history of aortic valve stenosis was discussed in detail.  Cardinal symptoms of angina, syncope, and dyspnea were reviewed and significance relative to prognosis was described.  The importance of sequential imaging for disease monitoring was emphasized.  Work-up including possible heart catheterization and CT angiography were described as essential components of staging for therapy.  Treatment options, TAVR and SAVR, were discussed in some detail with emphasis on TAVR.    Medication Adjustments/Labs and Tests Ordered: Current medicines are reviewed at length with the patient today.  Concerns regarding medicines are outlined above.  Orders Placed This Encounter  Procedures   CT CARDIAC SCORING (SELF PAY ONLY)   ECHOCARDIOGRAM COMPLETE    No orders of the defined types were placed in this encounter.   Patient Instructions  Medication Instructions:  Your physician recommends that you continue on your current medications as directed. Please refer to the Current Medication list given to you today.  *If you need a refill on your cardiac medications before your next appointment, please call your pharmacy*   Lab Work: None If you have labs  (blood work) drawn today and your tests are completely normal, you will receive your results only by: [place] (if you have MyChart) OR A paper copy in the mail If you have any lab test that is abnormal or we need to change your treatment, we will call you to review  the results.   Testing/Procedures: Your physician has requested that you have an echocardiogram. Echocardiography is a painless test that uses sound waves to create images of your heart. It provides your doctor with information about the size and shape of your heart and how well your heart's chambers and valves are working. This procedure takes approximately one hour. There are no restrictions for this procedure.  Your physician recommends that you have a Coronary Calcium Score performed.   Follow-Up: At [facility], you and your health needs are our priority.  As part of our continuing mission to provide you with exceptional heart care, we have created designated Provider Care Teams.  These Care Teams include your primary Cardiologist (physician) and Advanced Practice Providers (APPs -  Physician Assistants and Nurse Practitioners) who all work together to provide you with the care you need, when you need it.  We recommend signing up for the patient portal called "MyChart".  Sign up information is provided on this After Visit Summary.  MyChart is used to connect with patients for Virtual Visits (Telemedicine).  Patients are able to view lab/test results, encounter notes, upcoming appointments, etc.  Non-urgent messages can be sent to your provider as well.   To learn more about what you can do with MyChart, go to [email or web address].    Your next appointment:   6 month(s)  The format for your next appointment:   In Person  Provider:   You may see Daneen Schick, MD or one of the following Advanced Practice Providers on your designated Care Team:   Cecilie Kicks, NP   Other Instructions     Signed, Sinclair Grooms, MD  07/09/2021 12:23 PM    [place]

## 2021-07-09 ENCOUNTER — Other Ambulatory Visit: Payer: Self-pay

## 2021-07-09 ENCOUNTER — Ambulatory Visit (HOSPITAL_COMMUNITY)
Admission: RE | Admit: 2021-07-09 | Discharge: 2021-07-09 | Disposition: A | Discharge status: Home / Self Care | Payer: Medicare Other | Source: Ambulatory Visit | Attending: Family Medicine | Admitting: Family Medicine

## 2021-07-09 ENCOUNTER — Encounter: Payer: Self-pay | Admitting: Interventional Cardiology

## 2021-07-09 ENCOUNTER — Ambulatory Visit (INDEPENDENT_AMBULATORY_CARE_PROVIDER_SITE_OTHER): Payer: Medicare Other | Admitting: Interventional Cardiology

## 2021-07-09 ENCOUNTER — Encounter (HOSPITAL_COMMUNITY): Payer: Medicare Other

## 2021-07-09 ENCOUNTER — Telehealth: Payer: Self-pay | Admitting: Interventional Cardiology

## 2021-07-09 VITALS — BP 128/72 | HR 67 | Ht 62.0 in | Wt 164.6 lb

## 2021-07-09 DIAGNOSIS — R55 Syncope and collapse: Secondary | ICD-10-CM

## 2021-07-09 DIAGNOSIS — I350 Nonrheumatic aortic (valve) stenosis: Secondary | ICD-10-CM

## 2021-07-09 DIAGNOSIS — I491 Atrial premature depolarization: Secondary | ICD-10-CM

## 2021-07-09 DIAGNOSIS — R42 Dizziness and giddiness: Secondary | ICD-10-CM | POA: Insufficient documentation

## 2021-07-09 DIAGNOSIS — R001 Bradycardia, unspecified: Secondary | ICD-10-CM | POA: Diagnosis not present

## 2021-07-09 DIAGNOSIS — E782 Mixed hyperlipidemia: Secondary | ICD-10-CM

## 2021-07-09 DIAGNOSIS — I10 Essential (primary) hypertension: Secondary | ICD-10-CM

## 2021-07-09 NOTE — Telephone Encounter (Signed)
Spoke with pt and made her aware of Calcium Score appt.  Pt verbalized understanding and was in agreement with date and time.  Pt appreciative for call.

## 2021-07-09 NOTE — Patient Instructions (Signed)
Medication Instructions:  Your physician recommends that you continue on your current medications as directed. Please refer to the Current Medication list given to you today.  *If you need a refill on your cardiac medications before your next appointment, please call your pharmacy*   Lab Work: None If you have labs (blood work) drawn today and your tests are completely normal, you will receive your results only by: [place] (if you have MyChart) OR A paper copy in the mail If you have any lab test that is abnormal or we need to change your treatment, we will call you to review the results.   Testing/Procedures: Your physician has requested that you have an echocardiogram. Echocardiography is a painless test that uses sound waves to create images of your heart. It provides your doctor with information about the size and shape of your heart and how well your heart's chambers and valves are working. This procedure takes approximately one hour. There are no restrictions for this procedure.  Your physician recommends that you have a Coronary Calcium Score performed.   Follow-Up: At [facility], you and your health needs are our priority.  As part of our continuing mission to provide you with exceptional heart care, we have created designated Provider Care Teams.  These Care Teams include your primary Cardiologist (physician) and Advanced Practice Providers (APPs -  Physician Assistants and Nurse Practitioners) who all work together to provide you with the care you need, when you need it.  We recommend signing up for the patient portal called "MyChart".  Sign up information is provided on this After Visit Summary.  MyChart is used to connect with patients for Virtual Visits (Telemedicine).  Patients are able to view lab/test results, encounter notes, upcoming appointments, etc.  Non-urgent messages can be sent to your provider as well.   To learn more about what you can do with MyChart,  go to [email or web address].    Your next appointment:   6 month(s)  The format for your next appointment:   In Person  Provider:   You may see Daneen Schick, MD or one of the following Advanced Practice Providers on your designated Care Team:   Cecilie Kicks, NP   Other Instructions

## 2021-07-09 NOTE — Telephone Encounter (Signed)
Patient states she is returning a call. However, she is unsure of who called or what it was regarding. Please advise.

## 2021-07-13 NOTE — Addendum Note (Signed)
Addended by: Alysia Penna A on: 07/13/2021 07:43 AM   Modules accepted: Orders

## 2021-07-16 ENCOUNTER — Encounter (HOSPITAL_COMMUNITY): Payer: Self-pay

## 2021-07-16 ENCOUNTER — Emergency Department (HOSPITAL_COMMUNITY): Payer: Medicare Other

## 2021-07-16 ENCOUNTER — Emergency Department (HOSPITAL_COMMUNITY)
Admission: EM | Admit: 2021-07-16 | Discharge: 2021-07-17 | Disposition: A | Discharge status: Home / Self Care | Payer: Medicare Other | Attending: Emergency Medicine | Admitting: Emergency Medicine

## 2021-07-16 ENCOUNTER — Other Ambulatory Visit: Payer: Self-pay

## 2021-07-16 DIAGNOSIS — Z79899 Other long term (current) drug therapy: Secondary | ICD-10-CM | POA: Insufficient documentation

## 2021-07-16 DIAGNOSIS — K2100 Gastro-esophageal reflux disease with esophagitis, without bleeding: Secondary | ICD-10-CM | POA: Diagnosis not present

## 2021-07-16 DIAGNOSIS — K219 Gastro-esophageal reflux disease without esophagitis: Secondary | ICD-10-CM

## 2021-07-16 DIAGNOSIS — M25512 Pain in left shoulder: Secondary | ICD-10-CM | POA: Insufficient documentation

## 2021-07-16 DIAGNOSIS — W06XXXA Fall from bed, initial encounter: Secondary | ICD-10-CM | POA: Diagnosis not present

## 2021-07-16 DIAGNOSIS — M25511 Pain in right shoulder: Secondary | ICD-10-CM | POA: Diagnosis not present

## 2021-07-16 DIAGNOSIS — Z7982 Long term (current) use of aspirin: Secondary | ICD-10-CM | POA: Insufficient documentation

## 2021-07-16 DIAGNOSIS — Z91040 Latex allergy status: Secondary | ICD-10-CM | POA: Insufficient documentation

## 2021-07-16 DIAGNOSIS — I10 Essential (primary) hypertension: Secondary | ICD-10-CM | POA: Insufficient documentation

## 2021-07-16 DIAGNOSIS — R42 Dizziness and giddiness: Secondary | ICD-10-CM | POA: Diagnosis not present

## 2021-07-16 DIAGNOSIS — R110 Nausea: Secondary | ICD-10-CM | POA: Diagnosis present

## 2021-07-16 DIAGNOSIS — R1013 Epigastric pain: Secondary | ICD-10-CM | POA: Diagnosis not present

## 2021-07-16 LAB — COMPREHENSIVE METABOLIC PANEL
ALT: 18 U/L (ref 0–44)
AST: 24 U/L (ref 15–41)
Albumin: 4.3 g/dL (ref 3.5–5.0)
Alkaline Phosphatase: 76 U/L (ref 38–126)
Anion gap: 8 (ref 5–15)
BUN: 13 mg/dL (ref 8–23)
CO2: 29 mmol/L (ref 22–32)
Calcium: 9.6 mg/dL (ref 8.9–10.3)
Chloride: 102 mmol/L (ref 98–111)
Creatinine, Ser: 0.68 mg/dL (ref 0.44–1.00)
GFR, Estimated: >60 mL/min (ref >60)
Glucose, Bld: 116 mg/dL — ABNORMAL HIGH (ref 70–99)
Potassium: 3.4 mmol/L — ABNORMAL LOW (ref 3.5–5.1)
Sodium: 139 mmol/L (ref 135–145)
Total Bilirubin: 0.8 mg/dL (ref 0.3–1.2)
Total Protein: 8.1 g/dL (ref 6.5–8.1)

## 2021-07-16 LAB — CBC WITH DIFFERENTIAL/PLATELET
Abs Immature Granulocytes: 0.02 10*3/uL (ref 0.00–0.07)
Basophils Absolute: 0.1 10*3/uL (ref 0.0–0.1)
Basophils Relative: 1 %
Eosinophils Absolute: 0.1 10*3/uL (ref 0.0–0.5)
Eosinophils Relative: 1 %
HCT: 42 % (ref 36.0–46.0)
Hemoglobin: 13.9 g/dL (ref 12.0–15.0)
Immature Granulocytes: 0 %
Lymphocytes Relative: 18 %
Lymphs Abs: 2 10*3/uL (ref 0.7–4.0)
MCH: 29.8 pg (ref 26.0–34.0)
MCHC: 33.1 g/dL (ref 30.0–36.0)
MCV: 89.9 fL (ref 80.0–100.0)
Monocytes Absolute: 0.7 10*3/uL (ref 0.1–1.0)
Monocytes Relative: 6 %
Neutro Abs: 8.2 10*3/uL — ABNORMAL HIGH (ref 1.7–7.7)
Neutrophils Relative %: 74 %
Platelets: 454 10*3/uL — ABNORMAL HIGH (ref 150–400)
RBC: 4.67 MIL/uL (ref 3.87–5.11)
RDW: 12.8 % (ref 11.5–15.5)
WBC: 11.1 10*3/uL — ABNORMAL HIGH (ref 4.0–10.5)
nRBC: 0 % (ref 0.0–0.2)

## 2021-07-16 LAB — TROPONIN I (HIGH SENSITIVITY): Troponin I (High Sensitivity): 13 ng/L (ref <18)

## 2021-07-16 LAB — LIPASE, BLOOD: Lipase: 33 U/L (ref 11–51)

## 2021-07-16 NOTE — ED Provider Notes (Signed)
Emergency Medicine Provider Triage Evaluation Note  Anne [name] , a 75 y.o. female  was evaluated in triage.  Pt complains of nausea and bilateral shoulder pain.  Symptoms started when she woke up this morning.  She reports she did not sleep well and woke up feeling persistently nauseated.  Had some mild epigastric pain and also reports pain in both of her shoulders without associated injury or trauma.  Denies chest pain or shortness of breath.  No vomiting.  Had evaluation for similar symptoms in September..  Review of Systems  Positive: Nausea, epigastric pain, shoulder pain Negative: Fever, vomiting, chest pain  Physical Exam  BP 135/87 (BP Location: Left Arm)   Pulse 68   Temp 98.7 F (37.1 C) (Oral)   Resp 16   SpO2 99%  Gen:   Awake, no distress   Resp:  Normal effort  MSK:   Moves extremities without difficulty  Other:    Medical Decision Making  Medically screening exam initiated at 5:44 PM.  Appropriate orders placed.  NATALE BARBA was informed that the remainder of the evaluation will be completed by another provider, this initial triage assessment does not replace that evaluation, and the importance of remaining in the ED until their evaluation is complete.     Jacqlyn Larsen, PA-C 07/16/21 1746    Blanchie Dessert, MD 07/19/21 1801

## 2021-07-16 NOTE — ED Triage Notes (Signed)
Pt states she woke up this morning with N/V, c/o bilateral shoulder pain. Pt states she did not take her BP meds today. Pt seen in [place] ED 9/17 for same.

## 2021-07-17 NOTE — Addendum Note (Signed)
Addended by: Loren Racer on: 07/17/2021 11:56 AM   Modules accepted: Orders

## 2021-07-17 NOTE — Discharge Instructions (Signed)
1.  Take your omeprazole as prescribed.  This medication is to help with gastroesophageal reflux disease.  The symptoms he described occurring yesterday morning are very suggestive of reflux.  Also review the printed information on diet changes and other techniques to manage this condition. 2.  You expressed worry about your heart.  You have identified heart conditions that are being managed by your cardiologist.  It is very important that you take your medications as prescribed.  Is important you understand your medications and why you are taking them.  One of the most important things you can do when you have aortic stenosis, is manage your blood pressure.  This means you need to take your blood pressure medications and measure your blood pressures at home and keep a journal.  Reviewed these with your doctors to see if any adjustments are needed.  Go to all your scheduled appointments for monitoring. 3.  Your dizziness sounds like vertigo.  This is a condition that is often treated by ear nose throat specialist.  Discussed that the frequency and severity of your symptoms with your doctor and determine if you should have referral to ear nose throat specialist 4.  Return to emergency department if you have new, worsening or concerning symptoms.

## 2021-07-17 NOTE — ED Provider Notes (Signed)
[place] DEPT Provider Note   CSN: [id number] Arrival date & time: 07/16/21  1630     History Chief Complaint  Patient presents with   Emesis   Shoulder Pain    Anne [name] is a 75 y.o. female.  HPI Patient reports that she awakened yesterday morning with feeling of liquid coming up in the back of her throat.  She reports this occurred suddenly as she was waking up at 730.  She reports she did not have any associated pain but for triage review reported aching in her shoulders.  Patient described to me that her pillow had fallen off the bed and she was lying especially flat when she awakened with the symptoms.  There was no abdominal pain or chest pain, although she reports she continued to feel nauseated after she got up.  She reports she had taken a Claritin the night before and she thought that the Claritin to come back up.  She has previously been diagnosed with reflux and prescribed omeprazole by her doctor but she is not taking it.  Symptoms spontaneously improved when she got up.  She did not go on to have any vomiting.  Patient reports after she got up she also felt a little dizzy.  She reports she has had vertigo in the past and symptoms were similar.  The symptoms have been present and gradual in onset over 7 years with waxing and waning frequency.  No associated visual changes, no associated weakness numbness tingling.  Patient called her PCP office that morning to inquire about seeing somebody for her symptoms.  She was told by the nursing staff that there were no available appointments and due to her symptoms she should come to the emergency department and probably get a CT scan.  Patient had prolonged wait time overnight in the emergency department with a 16-hour wait for evaluation.  She is tired but has no symptoms at this time.    Past Medical History:  Diagnosis Date   Hyperlipidemia    Hypertension    Osteopenia    per DEXA on 07-19-09    Stroke [facility]) 09-2007   has mild left leg weakness, saw Dr. Brett Fairy    Patient Active Problem List   Diagnosis Date Noted   Bradycardia, unspecified 01/02/2018   Mallet deformity of fourth finger, right 02/02/2014   ALLERGIC RHINITIS 05/08/2010   DIZZINESS 02/01/2010   LIPOMA 08/11/2009   OSTEOPENIA 08/11/2009   SUPRAVENTRICULAR PREMATURE BEATS 07/28/2008   FATIGUE 06/16/2008   MYOSITIS 05/12/2008   Hyperlipidemia 11/24/2007   Essential hypertension 11/24/2007   History of cardiovascular disorder 11/24/2007    History reviewed. No pertinent surgical history.   OB History   No obstetric history on file.     Family History  Family history unknown: Yes    Social History   Tobacco Use   Smoking status: Never   Smokeless tobacco: Never  Vaping Use   Vaping Use: Never used  Substance Use Topics   Alcohol use: Not Currently    Comment: rare   Drug use: No    Home Medications Prior to Admission medications   Medication Sig Start Date End Date Taking? Authorizing Provider  acetaminophen (TYLENOL) 325 MG tablet Take 650 mg by mouth every 6 (six) hours as needed for mild pain.    [provider]  amLODipine (NORVASC) 10 MG tablet TAKE 1 TABLET BY MOUTH EVERY DAY 01/30/21   Laurey Morale, MD  aspirin 81  MG tablet Take 81 mg by mouth daily.    [provider]  Calcium-Vitamin D (CALTRATE 600 PLUS-VIT D PO) Take 1 tablet by mouth daily.    [provider]  COVID-19 mRNA vaccine, Moderna, (MODERNA COVID-19 VACCINE) 100 MCG/0.5ML injection Inject into the muscle. 05/04/21   Carlyle Basques, MD  hydrochlorothiazide (HYDRODIURIL) 25 MG tablet TAKE 1 TABLET BY MOUTH EVERY DAY 01/19/21   Laurey Morale, MD  ibuprofen (ADVIL) 800 MG tablet Take 800 mg by mouth every 6 (six) hours as needed. 04/06/21   [provider]  KLOR-CON M20 20 MEQ tablet TAKE 1 TABLET BY MOUTH EVERY DAY 11/03/20   Laurey Morale, MD  loratadine (CLARITIN) 10 MG tablet Take 1  tablet (10 mg total) by mouth daily. 01/31/21   Laurey Morale, MD  losartan (COZAAR) 50 MG tablet TAKE 1 TABLET BY MOUTH EVERY DAY 05/22/21   Laurey Morale, MD  meclizine (ANTIVERT) 25 MG tablet Take 1 tablet (25 mg total) by mouth every 4 (four) hours as needed for dizziness. 07/03/21   Laurey Morale, MD  Multiple Vitamins-Minerals (CENTRUM SILVER PO) Take 1 tablet by mouth daily.    [provider]  omeprazole (PRILOSEC) 40 MG capsule Take 1 capsule (40 mg total) by mouth daily. 11/06/20   Laurey Morale, MD  promethazine (PHENERGAN) 25 MG tablet Take 1 tablet (25 mg total) by mouth every 4 (four) hours as needed for nausea or vomiting. 03/22/19   Laurey Morale, MD  simvastatin (ZOCOR) 20 MG tablet Take 20 mg by mouth daily. 01/11/03   [provider]  vitamin C (ASCORBIC ACID) 500 MG tablet Take 500 mg by mouth daily.    [provider]    Allergies    Iodine and Latex  Review of Systems   Review of Systems 10 systems reviewed and negative except as per HPI Physical Exam Updated Vital Signs BP (!) 174/91 (BP Location: Left Arm)   Pulse 69   Temp 98.7 F (37.1 C) (Oral)   Resp 18   SpO2 98%   Physical Exam Constitutional:      Comments: Alert nontoxic clinically well in appearance  HENT:     Head: Normocephalic and atraumatic.     Ears:     Comments: Bilateral TMs slightly thickened but no erythema, bulging or effusions.  No wax in the ear canals.    Mouth/Throat:     Mouth: Mucous membranes are moist.     Pharynx: Oropharynx is clear.  Eyes:     Extraocular Movements: Extraocular movements intact.     Conjunctiva/sclera: Conjunctivae normal.     Pupils: Pupils are equal, round, and reactive to light.  Cardiovascular:     Comments: Heart is regular with occasional ectopic beat.  2\6 systolic ejection murmur. Pulmonary:     Effort: Pulmonary effort is normal.     Breath sounds: Normal breath sounds.  Abdominal:     General: There is no distension.      Palpations: Abdomen is soft.     Tenderness: There is no abdominal tenderness. There is no guarding.  Musculoskeletal:        General: Normal range of motion.     Cervical back: Neck supple.     Comments: No calf tenderness.  Trace edema bilateral feet.  Neurological:     General: No focal deficit present.     Mental Status: She is oriented to person, place, and time.  Cranial Nerves: No cranial nerve deficit.     Motor: No weakness.     Coordination: Coordination normal.  Psychiatric:        Mood and Affect: Mood normal.    ED Results / Procedures / Treatments   Labs (all labs ordered are listed, but only abnormal results are displayed) Labs Reviewed  COMPREHENSIVE METABOLIC PANEL - Abnormal; Notable for the following components:      Result Value   Potassium 3.4 (*)    Glucose, Bld 116 (*)    All other components within normal limits  CBC WITH DIFFERENTIAL/PLATELET - Abnormal; Notable for the following components:   WBC 11.1 (*)    Platelets 454 (*)    Neutro Abs 8.2 (*)    All other components within normal limits  LIPASE, BLOOD  TROPONIN I (HIGH SENSITIVITY)  TROPONIN I (HIGH SENSITIVITY)    EKG EKG Interpretation  Date/Time:  Monday July 16 2021 17:54:14 EDT Ventricular Rate:  64 PR Interval:  173 QRS Duration: 101 QT Interval:  408 QTC Calculation: 421 R Axis:   -52 Text Interpretation: Sinus rhythm Atrial premature complexes Consider left atrial enlargement Incomplete RBBB and LAFB Abnormal R-wave progression, early transition Left ventricular hypertrophy ST elevation, consider inferior injury no sig change from previous Confirmed by Charlesetta Shanks [phone]) on 07/17/2021 8:40:36 AM  Radiology DG Chest 2 View  Result Date: 07/16/2021 CLINICAL DATA:  Epigastric pain. EXAM: CHEST - 2 VIEW COMPARISON:  Chest x-ray 07/01/2021. FINDINGS: The patient's hand overlies the right chest which limits evaluation. There is no definite focal lung consolidation,  pleural effusion or pneumothorax. The cardiomediastinal silhouette is within normal limits. No acute fractures are seen. IMPRESSION: 1. Limited evaluation of the right chest. No definite acute cardiopulmonary process. Electronically Signed   By: Ronney Asters M.D.   On: 07/16/2021 18:56    Procedures Procedures   Medications Ordered in ED Medications - No data to display  ED Course  I have reviewed the triage vital signs and the nursing notes.  Pertinent labs & imaging results that were available during my care of the patient were reviewed by me and considered in my medical decision making (see chart for details).    MDM Rules/Calculators/A&P                           Patient is alert and nontoxic.  Clinically well in appearance.  She has known mild to moderate aortic stenosis.  This is being managed by cardiology with serial echoes.  No indication today that there has been any acute changes.  No suggestion of acute CHF.  Patient's EKG is unchanged and chest x-ray does not show vascular congestion, lungs are clear to auscultation.  Patient is not short of breath.  Patient reports she is constantly worried about her heart although it appears she is not consistently compliant with medications.  I have reinforced to the patient that the best thing she can do is compliance with her medications and routine follow-ups and visits with her cardiologist. Patient symptoms yesterday morning are very suggestive of gastroesophageal reflux.  She clearly describes a sensation of fluid welling up in the back of her throat but not vomiting.  This essentially awakened her from sleep.  Patient has been prescribed omeprazole by her physician but does not take it.  I have reinforced the plan for taking omeprazole consistently for 2 weeks at least and following up with her doctor.  Also  reviewing the information in discharge instructions regarding managing reflux at home. Patient complains of vertiginous type  symptoms.  These appear very longstanding.  They come and go.  No concerning his symptoms today to suggest acute stroke.  No acute symptoms to suggest intracranial hemorrhage.  Patient is not symptomatic at this time.  Stable for continued home management and follow-up. Final Clinical Impression(s) / ED Diagnoses Final diagnoses:  Gastroesophageal reflux disease, unspecified whether esophagitis present  Dizziness    Rx / DC Orders ED Discharge Orders     None        Charlesetta Shanks, MD 07/17/21 [phone]

## 2021-07-19 ENCOUNTER — Other Ambulatory Visit: Payer: Self-pay

## 2021-07-19 ENCOUNTER — Ambulatory Visit (HOSPITAL_COMMUNITY): Payer: Medicare Other | Attending: Cardiology

## 2021-07-19 ENCOUNTER — Ambulatory Visit (INDEPENDENT_AMBULATORY_CARE_PROVIDER_SITE_OTHER)
Admission: RE | Admit: 2021-07-19 | Discharge: 2021-07-19 | Disposition: A | Discharge status: Home / Self Care | Payer: Self-pay | Source: Ambulatory Visit | Attending: Interventional Cardiology | Admitting: Interventional Cardiology

## 2021-07-19 DIAGNOSIS — E782 Mixed hyperlipidemia: Secondary | ICD-10-CM

## 2021-07-19 DIAGNOSIS — I350 Nonrheumatic aortic (valve) stenosis: Secondary | ICD-10-CM | POA: Diagnosis not present

## 2021-07-19 DIAGNOSIS — I10 Essential (primary) hypertension: Secondary | ICD-10-CM

## 2021-07-19 LAB — ECHOCARDIOGRAM COMPLETE
AR max vel: 1.58 cm2
AV Area VTI: 1.73 cm2
AV Area mean vel: 1.33 cm2
AV Mean grad: 21 mmHg
AV Peak grad: 31.8 mmHg
Ao pk vel: 2.82 m/s
Area-P 1/2: 3.85 cm2
P 1/2 time: 650 msec
S' Lateral: 2.8 cm

## 2021-07-20 ENCOUNTER — Other Ambulatory Visit: Payer: Self-pay

## 2021-07-20 ENCOUNTER — Other Ambulatory Visit: Payer: Self-pay | Admitting: *Deleted

## 2021-07-20 MED ORDER — PRAVASTATIN SODIUM 20 MG PO TABS: 20.0000 mg | ORAL_TABLET | ORAL | 3 refills | Status: DC

## 2021-08-02 ENCOUNTER — Other Ambulatory Visit: Payer: Self-pay | Admitting: Family Medicine

## 2021-08-02 ENCOUNTER — Telehealth: Payer: Self-pay | Admitting: Family Medicine

## 2021-08-02 NOTE — Chronic Care Management (AMB) (Signed)
  Chronic Care Management   Outreach Note  08/02/2021 Name: Anne [name] MRN: [id number] DOB: 1945-10-20  Referred by: Laurey Morale, MD Reason for referral : No chief complaint on file.   An unsuccessful telephone outreach was attempted today. The patient was referred to the pharmacist for assistance with care management and care coordination.   Follow Up Plan:   Tatjana Dellinger Upstream Scheduler

## 2021-08-03 ENCOUNTER — Ambulatory Visit: Payer: Medicare Other

## 2021-08-06 ENCOUNTER — Other Ambulatory Visit: Payer: Self-pay

## 2021-08-10 ENCOUNTER — Telehealth: Payer: Self-pay | Admitting: Family Medicine

## 2021-08-10 NOTE — Chronic Care Management (AMB) (Signed)
  Chronic Care Management   Outreach Note  08/10/2021 Name: Anne [name] MRN: [id number] DOB: 09-19-1946  Referred by: Laurey Morale, MD Reason for referral : No chief complaint on file.   A second unsuccessful telephone outreach was attempted today. The patient was referred to pharmacist for assistance with care management and care coordination.  Follow Up Plan:   Tatjana Dellinger Upstream Scheduler

## 2021-08-13 ENCOUNTER — Ambulatory Visit: Payer: Medicare Other | Admitting: Pharmacist

## 2021-08-13 NOTE — Progress Notes (Deleted)
Patient ID: Anne [name]                 DOB: Nov 17, 1945                    MRN: [id number]     HPI: Anne [name] is a 75 y.o. female patient referred to lipid clinic by Dr Tamala Julian. PMH is significant for HTN, HLD, stroke in 2008, prediabetes (A1c 6.3% 03/2021), N/V and dizziness which is made worse when she moves her head quickly. Saw PCP 9/20 for this, dizziness consistent with vertigo of vestibular origin. Advised to drink fluids, take meclizine as needed, and was scheduled for carotid dopplers 07/09/21 which showed 1-39% bilateral stenosis. 2018 brain MRI showed chronic small vessel ischemia. She saw Dr Tamala Julian for initial visit on 9/26 and was scheduled for echo and calcium scoring. Echo 07/19/21 showed EF 60-65% with G1DD and mild aortic stenosis. Calcium score the same day was elevated at 176 (79th percentile for age, sex, and race matched controls) with aortic atherosclerosis noted. She previously reported myalgias on simvastatin and another statin. She was started on low dose pravastatin 20mg  MWF and referred to lipid clinic for further management  LDL 156 baseline, goal < 70 Tolerating prava [employer] to daily or try rosuva Discuss repatha or nexlizet Need updated insurance info  Current Medications: pravastatin 20mg  MWF Intolerances: simvastatin 20mg  daily (myalgias) Risk Factors: stroke, elevated calcium score, preDM, bilateral carotid stenosis LDL goal: 70mg /dL  Diet:   Exercise:   Family History: Unknown by pt  Social History: Denies tobacco, drug use, rare alcohol use  Labs: 03/19/21: TC 234, TG 146, HDL 49.2, LDL 156 (no LLT)  Past Medical History:  Diagnosis Date   Hyperlipidemia    Hypertension    Osteopenia    per DEXA on 07-19-09   Stroke ([place]) 09-2007   has mild left leg weakness, saw Dr. Brett Fairy    Current Outpatient Medications on File Prior to Visit  Medication Sig Dispense Refill   acetaminophen (TYLENOL) 325 MG tablet Take 650 mg by mouth every 6 (six)  hours as needed for mild pain.     amLODipine (NORVASC) 10 MG tablet TAKE 1 TABLET BY MOUTH EVERY DAY 90 tablet 2   aspirin 81 MG tablet Take 81 mg by mouth daily.     Calcium-Vitamin D (CALTRATE 600 PLUS-VIT D PO) Take 1 tablet by mouth daily.     COVID-19 mRNA vaccine, Moderna, (MODERNA COVID-19 VACCINE) 100 MCG/0.5ML injection Inject into the muscle. 0.25 mL 0   hydrochlorothiazide (HYDRODIURIL) 25 MG tablet TAKE 1 TABLET BY MOUTH EVERY DAY 90 tablet 0   ibuprofen (ADVIL) 800 MG tablet Take 800 mg by mouth every 6 (six) hours as needed.     KLOR-CON M20 20 MEQ tablet TAKE 1 TABLET BY MOUTH EVERY DAY 90 tablet 3   loratadine (CLARITIN) 10 MG tablet Take 1 tablet (10 mg total) by mouth daily. 30 tablet 11   losartan (COZAAR) 50 MG tablet TAKE 1 TABLET BY MOUTH EVERY DAY 90 tablet 0   meclizine (ANTIVERT) 25 MG tablet Take 1 tablet (25 mg total) by mouth every 4 (four) hours as needed for dizziness. 60 tablet 2   Multiple Vitamins-Minerals (CENTRUM SILVER PO) Take 1 tablet by mouth daily.     omeprazole (PRILOSEC) 40 MG capsule Take 1 capsule (40 mg total) by mouth daily. 30 capsule 3   pravastatin (PRAVACHOL) 20 MG tablet Take 1 tablet (20 mg total)  by mouth every Monday, Wednesday, and Friday. 45 tablet 3   promethazine (PHENERGAN) 25 MG tablet Take 1 tablet (25 mg total) by mouth every 4 (four) hours as needed for nausea or vomiting. 60 tablet 0   vitamin C (ASCORBIC ACID) 500 MG tablet Take 500 mg by mouth daily.     No current facility-administered medications on file prior to visit.    Allergies  Allergen Reactions   Iodine Itching and Other (See Comments)    "lump in tongue when eats seafood"   Latex Itching    GLOVES    Assessment/Plan:  1. Hyperlipidemia -

## 2021-08-14 ENCOUNTER — Other Ambulatory Visit: Payer: Self-pay

## 2021-08-14 ENCOUNTER — Ambulatory Visit: Payer: Medicare Other | Attending: Internal Medicine

## 2021-08-14 DIAGNOSIS — Z23 Encounter for immunization: Secondary | ICD-10-CM

## 2021-08-14 MED ORDER — MODERNA COVID-19 BIVAL BOOSTER 50 MCG/0.5ML IM SUSP
INTRAMUSCULAR | 0 refills | Status: DC
  Filled 2021-08-14: qty 0.5, 1d supply, fill #0

## 2021-08-14 NOTE — Progress Notes (Signed)
   Covid-19 Vaccination Clinic  Name:  Anne [name]    MRN: [id number] DOB: 1946/09/27  08/14/2021  Ms. [name] was observed post Covid-19 immunization for 15 minutes without incident. She was provided with Vaccine Information Sheet and instruction to access the V-Safe system.   Ms. [name] was instructed to call 911 with any severe reactions post vaccine: Difficulty breathing  Swelling of face and throat  A fast heartbeat  A bad rash all over body  Dizziness and weakness   Immunizations Administered     Name Date Dose VIS Date Route   Moderna Covid-19 vaccine Bivalent Booster 08/14/2021 10:15 AM 0.5 mL 05/26/2021 Intramuscular   Manufacturer: Levan Hurst   Lot: [id number]   NDC: 72620-355-97      Lu Duffel, PharmD, MBA Clinical Acute Care Pharmacist

## 2021-08-16 ENCOUNTER — Telehealth: Payer: Self-pay | Admitting: Family Medicine

## 2021-08-16 NOTE — Chronic Care Management (AMB) (Signed)
  Chronic Care Management   Outreach Note  08/16/2021 Name: Anne [name] MRN: [id number] DOB: April 29, 1946  Referred by: Laurey Morale, MD Reason for referral : No chief complaint on file.   Third unsuccessful telephone outreach was attempted today. The patient was referred to the pharmacist for assistance with care management and care coordination.   Follow Up Plan:   Tatjana Dellinger Upstream Scheduler

## 2021-08-29 ENCOUNTER — Ambulatory Visit (INDEPENDENT_AMBULATORY_CARE_PROVIDER_SITE_OTHER): Payer: Medicare Other | Admitting: Family Medicine

## 2021-08-29 ENCOUNTER — Other Ambulatory Visit: Payer: Self-pay

## 2021-08-29 ENCOUNTER — Encounter: Payer: Self-pay | Admitting: Family Medicine

## 2021-08-29 VITALS — BP 120/76 | HR 63 | Temp 98.2°F | Wt 162.1 lb

## 2021-08-29 DIAGNOSIS — K219 Gastro-esophageal reflux disease without esophagitis: Secondary | ICD-10-CM

## 2021-08-29 DIAGNOSIS — E782 Mixed hyperlipidemia: Secondary | ICD-10-CM | POA: Diagnosis not present

## 2021-08-29 DIAGNOSIS — Z23 Encounter for immunization: Secondary | ICD-10-CM | POA: Diagnosis not present

## 2021-08-29 DIAGNOSIS — R42 Dizziness and giddiness: Secondary | ICD-10-CM | POA: Diagnosis not present

## 2021-08-29 HISTORY — DX: Gastro-esophageal reflux disease without esophagitis: K21.9

## 2021-08-29 NOTE — Progress Notes (Signed)
   Subjective:    Patient ID: Anne [name], female    DOB: 1945-12-20, 75 y.o.   MRN: [id number]  HPI Here to follow up on an ER visit on 07-16-21 and a cardiology visit with Dr. Tamala Julian. The day she went to the ER she had a sudden rush of fluid in the back of her throat while lying in bed. Then when she got up she had a vertigo attack. Her workup was unremarkable, including labs, EKG, and CXR. She was told she had GERD and that she needs to take a PPI daily. I had put her on Omeprazole some years ago, but she never took it regularly. She also has a long hx of vertigo, which has been worked up in the past. Since that day she has felt fine. She now agrees to take Omeprazole every day. Also I had advised her to take a statin to get her cholesterol down years ago but she always declined. She had a cardiac calcium study on 07-19-21 showing moderate plaque buildup. Dr. Tamala Julian stressed the need for her to get her cholesterol under control (her LDL in June was 156), and he started her on Pravastatin.    Review of Systems  Constitutional: Negative.   Respiratory: Negative.    Cardiovascular: Negative.   Neurological: Negative.       Objective:   Physical Exam Constitutional:      Appearance: Normal appearance. She is not ill-appearing.  Cardiovascular:     Rate and Rhythm: Normal rate and regular rhythm.     Pulses: Normal pulses.     Heart sounds: Normal heart sounds.  Pulmonary:     Effort: Pulmonary effort is normal.     Breath sounds: Normal breath sounds.  Neurological:     Mental Status: She is alert.          Assessment & Plan:  For her GERD, she will take Omeprazole daily. For the lipids, she will take Pravastatin daily. She will see [place] for the vertigo as needed. We spent a total of ( 30  ) minutes reviewing records and discussing these issues.  Alysia Penna, MD  Alysia Penna, MD

## 2021-08-29 NOTE — Addendum Note (Signed)
Addended by: Wyvonne Lenz on: 08/29/2021 02:11 PM   Modules accepted: Orders

## 2021-09-17 ENCOUNTER — Telehealth: Payer: Self-pay

## 2021-09-17 ENCOUNTER — Other Ambulatory Visit: Payer: Self-pay | Admitting: Family Medicine

## 2021-09-17 DIAGNOSIS — Z1231 Encounter for screening mammogram for malignant neoplasm of breast: Secondary | ICD-10-CM

## 2021-09-17 NOTE — Telephone Encounter (Signed)
Appointment was scheduled location is [facility]

## 2021-09-17 NOTE — Telephone Encounter (Signed)
Patient called asking for referral for mammogram patient stated she could not remember where she went in the past.

## 2021-10-14 ENCOUNTER — Ambulatory Visit (HOSPITAL_COMMUNITY)
Admission: EM | Admit: 2021-10-14 | Discharge: 2021-10-14 | Disposition: A | Discharge status: Home / Self Care | Payer: Medicare Other | Attending: Physician Assistant | Admitting: Physician Assistant

## 2021-10-14 ENCOUNTER — Ambulatory Visit (INDEPENDENT_AMBULATORY_CARE_PROVIDER_SITE_OTHER): Payer: Medicare Other

## 2021-10-14 ENCOUNTER — Other Ambulatory Visit: Payer: Self-pay

## 2021-10-14 ENCOUNTER — Encounter (HOSPITAL_COMMUNITY): Payer: Self-pay

## 2021-10-14 DIAGNOSIS — R059 Cough, unspecified: Secondary | ICD-10-CM

## 2021-10-14 DIAGNOSIS — J40 Bronchitis, not specified as acute or chronic: Secondary | ICD-10-CM

## 2021-10-14 DIAGNOSIS — J329 Chronic sinusitis, unspecified: Secondary | ICD-10-CM

## 2021-10-14 DIAGNOSIS — R058 Other specified cough: Secondary | ICD-10-CM

## 2021-10-14 MED ORDER — CEFDINIR 300 MG PO CAPS: 300.0000 mg | ORAL_CAPSULE | Freq: Two times a day (BID) | ORAL | 0 refills | Status: DC

## 2021-10-14 MED ORDER — BENZONATATE 100 MG PO CAPS: 100.0000 mg | ORAL_CAPSULE | Freq: Three times a day (TID) | ORAL | 0 refills | Status: DC

## 2021-10-14 NOTE — ED Provider Notes (Signed)
[facility]    CSN: [id number] Arrival date & time: 10/14/21  1009      History   Chief Complaint Chief Complaint  Patient presents with   Sore Throat   Nasal Congestion    HPI ENIOLA CERULLO is a 76 y.o. female.   Patient presents today with a 8-day history of URI symptoms.  Reports that symptoms began just before Christmas but have worsened significantly in the past 3 days.  She reports nasal congestion, productive cough, intermittent shortness of breath, chills, nausea/vomiting associated with coughing.  Denies any abdominal pain, diarrhea, fever, chest pain.  She has been taking Mucinex, Tylenol, gargle with warm salt water without improvement of symptoms.  Denies any known sick contacts but has been around many people for the holidays.  She is up-to-date on influenza and COVID-19 vaccinations.  She denies any history of asthma, allergies, smoking; does report she has had intermittent shortness of breath and her primary care provider is considering asthma as potential cause.  She did take an at-home COVID test that was negative.  She reports cough is worsening prompting evaluation today.   Past Medical History:  Diagnosis Date   Hyperlipidemia    Hypertension    Osteopenia    per DEXA on 07-19-09   Stroke ([place]) 09-2007   has mild left leg weakness, saw Dr. Brett Fairy    Patient Active Problem List   Diagnosis Date Noted   GERD (gastroesophageal reflux disease) 08/29/2021   Bradycardia, unspecified 01/02/2018   Mallet deformity of fourth finger, right 02/02/2014   ALLERGIC RHINITIS 05/08/2010   DIZZINESS 02/01/2010   LIPOMA 08/11/2009   OSTEOPENIA 08/11/2009   SUPRAVENTRICULAR PREMATURE BEATS 07/28/2008   FATIGUE 06/16/2008   MYOSITIS 05/12/2008   Hyperlipidemia 11/24/2007   Essential hypertension 11/24/2007   History of cardiovascular disorder 11/24/2007    History reviewed. No pertinent surgical history.  OB History   No obstetric history on file.       Home Medications    Prior to Admission medications   Medication Sig Start Date End Date Taking? Authorizing Provider  acetaminophen (TYLENOL) 325 MG tablet Take 650 mg by mouth every 6 (six) hours as needed for mild pain.    [provider]  amLODipine (NORVASC) 10 MG tablet TAKE 1 TABLET BY MOUTH EVERY DAY 01/30/21   Laurey Morale, MD  aspirin 81 MG tablet Take 81 mg by mouth daily.    [provider]  benzonatate (TESSALON) 100 MG capsule Take 1 capsule (100 mg total) by mouth every 8 (eight) hours. 10/14/21   Kora Groom K, PA-C  Calcium-Vitamin D (CALTRATE 600 PLUS-VIT D PO) Take 1 tablet by mouth daily.    [provider]  cefdinir (OMNICEF) 300 MG capsule Take 1 capsule (300 mg total) by mouth 2 (two) times daily. 10/14/21   Nekisha Mcdiarmid K, PA-C  COVID-19 mRNA bivalent vaccine, Moderna, (MODERNA COVID-19 BIVAL BOOSTER) 50 MCG/0.5ML injection Inject into the muscle. 08/14/21   Carlyle Basques, MD  COVID-19 mRNA vaccine, Moderna, (MODERNA COVID-19 VACCINE) 100 MCG/0.5ML injection Inject into the muscle. 05/04/21   Carlyle Basques, MD  hydrochlorothiazide (HYDRODIURIL) 25 MG tablet TAKE 1 TABLET BY MOUTH EVERY DAY 01/19/21   Laurey Morale, MD  ibuprofen (ADVIL) 800 MG tablet Take 800 mg by mouth every 6 (six) hours as needed. 04/06/21   [provider]  KLOR-CON M20 20 MEQ tablet TAKE 1 TABLET BY MOUTH EVERY DAY 11/03/20   Laurey Morale, MD  loratadine (CLARITIN) 10 MG tablet Take 1 tablet (10 mg total) by mouth daily. 01/31/21   Laurey Morale, MD  losartan (COZAAR) 50 MG tablet TAKE 1 TABLET BY MOUTH EVERY DAY 08/03/21   Laurey Morale, MD  meclizine (ANTIVERT) 25 MG tablet Take 1 tablet (25 mg total) by mouth every 4 (four) hours as needed for dizziness. 07/03/21   Laurey Morale, MD  Multiple Vitamins-Minerals (CENTRUM SILVER PO) Take 1 tablet by mouth daily.    [provider]  omeprazole (PRILOSEC) 40 MG capsule Take 1 capsule (40 mg total) by  mouth daily. 11/06/20   Laurey Morale, MD  pravastatin (PRAVACHOL) 20 MG tablet Take 1 tablet (20 mg total) by mouth every Monday, Wednesday, and Friday. 07/20/21   Belva Crome, MD  promethazine (PHENERGAN) 25 MG tablet Take 1 tablet (25 mg total) by mouth every 4 (four) hours as needed for nausea or vomiting. 03/22/19   Laurey Morale, MD  vitamin C (ASCORBIC ACID) 500 MG tablet Take 500 mg by mouth daily.    [provider]    Family History Family History  Family history unknown: Yes    Social History Social History   Tobacco Use   Smoking status: Never   Smokeless tobacco: Never  Vaping Use   Vaping Use: Never used  Substance Use Topics   Alcohol use: Not Currently    Comment: rare   Drug use: No     Allergies   Iodine and Latex   Review of Systems Review of Systems  Constitutional:  Positive for activity change and fatigue. Negative for appetite change and fever.  HENT:  Positive for congestion, postnasal drip and sore throat. Negative for sinus pressure and sneezing.   Respiratory:  Positive for cough and shortness of breath.   Cardiovascular:  Negative for chest pain.  Gastrointestinal:  Positive for nausea and vomiting. Negative for abdominal pain and diarrhea.  Musculoskeletal:  Negative for arthralgias and myalgias.  Neurological:  Negative for dizziness, light-headedness and headaches.    Physical Exam Triage Vital Signs ED Triage Vitals  Enc Vitals Group     BP 10/14/21 1038 130/77     Pulse Rate 10/14/21 1038 66     Resp 10/14/21 1038 18     Temp 10/14/21 1038 (!) 97.3 F (36.3 C)     Temp Source 10/14/21 1038 Oral     SpO2 10/14/21 1038 100 %     Weight --      Height --      Head Circumference --      Peak Flow --      Pain Score 10/14/21 1035 4     Pain Loc --      Pain Edu? --      Excl. in [place]? --    No data found.  Updated Vital Signs BP 130/77 (BP Location: Right Arm)    Pulse 66    Temp (!) 97.3 F (36.3 C) (Oral)    Resp  18    SpO2 100%   Visual Acuity Right Eye Distance:   Left Eye Distance:   Bilateral Distance:    Right Eye Near:   Left Eye Near:    Bilateral Near:     Physical Exam Vitals reviewed.  Constitutional:      General: She is awake. She is not in acute distress.    Appearance: Normal appearance. She is well-developed. She is not ill-appearing.     Comments:  Very pleasant female appears stated age in no acute distress sitting comfortably in exam room table  HENT:     Head: Normocephalic and atraumatic.     Right Ear: Tympanic membrane, ear canal and external ear normal. Tympanic membrane is not erythematous or bulging.     Left Ear: Tympanic membrane, ear canal and external ear normal. Tympanic membrane is not erythematous or bulging.     Nose:     Right Sinus: Maxillary sinus tenderness present. No frontal sinus tenderness.     Left Sinus: Maxillary sinus tenderness present. No frontal sinus tenderness.     Mouth/Throat:     Pharynx: Uvula midline. Posterior oropharyngeal erythema present. No oropharyngeal exudate.  Cardiovascular:     Rate and Rhythm: Normal rate and regular rhythm.     Heart sounds: S1 normal and S2 normal. Murmur heard.  Pulmonary:     Effort: Pulmonary effort is normal.     Breath sounds: Rhonchi present. No wheezing or rales.     Comments: Scattered rhonchi clear with cough Psychiatric:        Behavior: Behavior is cooperative.     UC Treatments / Results  Labs (all labs ordered are listed, but only abnormal results are displayed) Labs Reviewed - No data to display  EKG   Radiology DG Chest 2 View  Result Date: 10/14/2021 CLINICAL DATA:  Worsening cough 2 weeks. EXAM: CHEST - 2 VIEW COMPARISON:  July 16, 2021 FINDINGS: The heart size and mediastinal contours are within normal limits. Both lungs are clear. The visualized skeletal structures are unremarkable. IMPRESSION: No active cardiopulmonary disease. Electronically Signed   By: Abelardo Diesel  M.D.   On: 10/14/2021 11:39    Procedures Procedures (including critical care time)  Medications Ordered in UC Medications - No data to display  Initial Impression / Assessment and Plan / UC Course  I have reviewed the triage vital signs and the nursing notes.  Pertinent labs & imaging results that were available during my care of the patient were reviewed by me and considered in my medical decision making (see chart for details).     No indication for viral testing given patient has been symptomatic for over a week with recent worsening of symptoms.  Given rhonchi on exam and worsening cough chest x-ray was obtained that showed no active cardiopulmonary disease.  Will cover for sinobronchitis given worsening symptoms with Omnicef twice daily.  She was given Tessalon for cough.  Recommended over-the-counter medication including Mucinex, Flonase, Tylenol for symptom relief.  She was encouraged to rest and drink plenty of fluid.  Discussed alarm symptoms that warrant emergent evaluation including high fever, worsening cough, shortness of breath, nausea/vomiting interfering with oral intake, chest pain.  Strict return precautions given to which she expressed understanding.  Final Clinical Impressions(s) / UC Diagnoses   Final diagnoses:  Sinobronchitis  Productive cough     Discharge Instructions      Your x-ray was normal with no evidence of pneumonia.  We are going to treat for sinobronchitis with cefdinir twice daily for 10 days.  Take this with food as it can upset your stomach.  Use Tessalon for cough.  Use Mucinex, Flonase, Tylenol for additional symptom relief.  Make sure you rest and drink plenty of fluid.  If you develop any worsening symptoms including chest pain, shortness of breath, worsening cough, fever, nausea/vomiting interfering with oral intake you need to be seen immediately.  If symptoms do not improve within a few  days please follow-up with either our clinic or your  primary care provider.     ED Prescriptions     Medication Sig Dispense Auth. Provider   cefdinir (OMNICEF) 300 MG capsule  (Status: Discontinued) Take 1 capsule (300 mg total) by mouth 2 (two) times daily. 20 capsule Mikalah Skyles K, PA-C   benzonatate (TESSALON) 100 MG capsule  (Status: Discontinued) Take 1 capsule (100 mg total) by mouth every 8 (eight) hours. 21 capsule Raylie Maddison K, PA-C   benzonatate (TESSALON) 100 MG capsule Take 1 capsule (100 mg total) by mouth every 8 (eight) hours. 21 capsule Ada Woodbury K, PA-C   cefdinir (OMNICEF) 300 MG capsule Take 1 capsule (300 mg total) by mouth 2 (two) times daily. 20 capsule Alishea Beaudin, Derry Skill, PA-C      PDMP not reviewed this encounter.   Terrilee Croak, PA-C 10/14/21 1158

## 2021-10-14 NOTE — Discharge Instructions (Addendum)
Your x-ray was normal with no evidence of pneumonia.  We are going to treat for sinobronchitis with cefdinir twice daily for 10 days.  Take this with food as it can upset your stomach.  Use Tessalon for cough.  Use Mucinex, Flonase, Tylenol for additional symptom relief.  Make sure you rest and drink plenty of fluid.  If you develop any worsening symptoms including chest pain, shortness of breath, worsening cough, fever, nausea/vomiting interfering with oral intake you need to be seen immediately.  If symptoms do not improve within a few days please follow-up with either our clinic or your primary care provider.

## 2021-10-14 NOTE — ED Triage Notes (Signed)
Pt reports having a sore throat and congestion.   States she was in a building where it was cold and states it was hard for her to breathe.   States she started to have itching in her eyes and runny nose x 3 days.

## 2021-10-16 ENCOUNTER — Ambulatory Visit (INDEPENDENT_AMBULATORY_CARE_PROVIDER_SITE_OTHER): Payer: Medicare Other

## 2021-10-16 VITALS — BP 130/78 | HR 68 | Temp 97.8°F | Ht 61.5 in | Wt 163.3 lb

## 2021-10-16 DIAGNOSIS — Z0000 Encounter for general adult medical examination without abnormal findings: Secondary | ICD-10-CM | POA: Diagnosis not present

## 2021-10-16 NOTE — Progress Notes (Signed)
This visit occurred during the SARS-CoV-2 public health emergency.  Safety protocols were in place, including screening questions prior to the visit, additional usage of staff PPE, and extensive cleaning of exam room while observing appropriate contact time as indicated for disinfecting solutions.  Subjective:   Anne [name] is a 76 y.o. female who presents for Medicare Annual (Subsequent) preventive examination.  Review of Systems     Cardiac Risk Factors include: advanced age (>24men, >17 women);dyslipidemia;hypertension;obesity (BMI >30kg/m2)     Objective:    Today's Vitals   10/16/21 0827  BP: 130/78  Pulse: 68  Temp: 97.8 F (36.6 C)  TempSrc: Oral  SpO2: 98%  Weight: 163 lb 4.8 oz (74.1 kg)  Height: 5' 1.5" (1.562 m)   Body mass index is 30.36 kg/m.  Advanced Directives 10/16/2021 07/16/2021 06/30/2021 10/03/2020 09/03/2018 02/07/2018 06/15/2017  Does Patient Have a Medical Advance Directive? No No No No No No No  Would patient like information on creating a medical advance directive? Yes (MAU/Ambulatory/Procedural Areas - Information given) No - Patient declined - No - Patient declined - - No - Patient declined    Current Medications (verified) Outpatient Encounter Medications as of 10/16/2021  Medication Sig   acetaminophen (TYLENOL) 325 MG tablet Take 650 mg by mouth every 6 (six) hours as needed for mild pain.   amLODipine (NORVASC) 10 MG tablet TAKE 1 TABLET BY MOUTH EVERY DAY   aspirin 81 MG tablet Take 81 mg by mouth daily.   benzonatate (TESSALON) 100 MG capsule Take 1 capsule (100 mg total) by mouth every 8 (eight) hours.   Calcium-Vitamin D (CALTRATE 600 PLUS-VIT D PO) Take 1 tablet by mouth daily.   cefdinir (OMNICEF) 300 MG capsule Take 1 capsule (300 mg total) by mouth 2 (two) times daily.   hydrochlorothiazide (HYDRODIURIL) 25 MG tablet TAKE 1 TABLET BY MOUTH EVERY DAY   ibuprofen (ADVIL) 800 MG tablet Take 800 mg by mouth every 6 (six) hours as needed.    KLOR-CON M20 20 MEQ tablet TAKE 1 TABLET BY MOUTH EVERY DAY   loratadine (CLARITIN) 10 MG tablet Take 1 tablet (10 mg total) by mouth daily.   losartan (COZAAR) 50 MG tablet TAKE 1 TABLET BY MOUTH EVERY DAY   meclizine (ANTIVERT) 25 MG tablet Take 1 tablet (25 mg total) by mouth every 4 (four) hours as needed for dizziness.   Multiple Vitamins-Minerals (CENTRUM SILVER PO) Take 1 tablet by mouth daily.   omeprazole (PRILOSEC) 40 MG capsule Take 1 capsule (40 mg total) by mouth daily.   pravastatin (PRAVACHOL) 20 MG tablet Take 1 tablet (20 mg total) by mouth every Monday, Wednesday, and Friday.   promethazine (PHENERGAN) 25 MG tablet Take 1 tablet (25 mg total) by mouth every 4 (four) hours as needed for nausea or vomiting.   vitamin C (ASCORBIC ACID) 500 MG tablet Take 500 mg by mouth daily.   COVID-19 mRNA bivalent vaccine, Moderna, (MODERNA COVID-19 BIVAL BOOSTER) 50 MCG/0.5ML injection Inject into the muscle.   COVID-19 mRNA vaccine, Moderna, (MODERNA COVID-19 VACCINE) 100 MCG/0.5ML injection Inject into the muscle.   No facility-administered encounter medications on file as of 10/16/2021.    Allergies (verified) Iodine and Latex   History: Past Medical History:  Diagnosis Date   Hyperlipidemia    Hypertension    Osteopenia    per DEXA on 07-19-09   Stroke ([place]) 09-2007   has mild left leg weakness, saw Dr. Brett Fairy   No past surgical history on file.  Family History  Family history unknown: Yes   Social History   Socioeconomic History   Marital status: Single    Spouse name: Not on file   Number of children: Not on file   Years of education: Not on file   Highest education level: Not on file  Occupational History   Occupation: part Doctor, general practice  Tobacco Use   Smoking status: Never   Smokeless tobacco: Never  Vaping Use   Vaping Use: Never used  Substance and Sexual Activity   Alcohol use: Not Currently    Comment: rare   Drug use: No   Sexual activity: Not Currently   Other Topics Concern   Not on file  Social History Narrative   Not on file   Social Determinants of Health   Financial Resource Strain: Low Risk    Difficulty of Paying Living Expenses: Not hard at all  Food Insecurity: No Food Insecurity   Worried About Charity fundraiser in the Last Year: Never true   [place] in the Last Year: Never true  Transportation Needs: No Transportation Needs   Lack of Transportation (Medical): No   Lack of Transportation (Non-Medical): No  Physical Activity: Inactive   Days of Exercise per Week: 0 days   Minutes of Exercise per Session: 0 min  Stress: Stress Concern Present   Feeling of Stress : To some extent  Social Connections: Not on file    Tobacco Counseling Counseling given: Not Answered   Clinical Intake:  Pre-visit preparation completed: Yes  Pain : No/denies pain     Nutritional Status: BMI > 30  Obese Nutritional Risks: Nausea/ vomitting/ diarrhea (nausea this weekend, resolved) Diabetes: No  How often do you need to have someone help you when you read instructions, pamphlets, or other written materials from your doctor or pharmacy?: 1 - Never What is the last grade level you completed in school?: BS degree  Diabetic? no  Interpreter Needed?: No  Information entered by :: NAllen LPN   Activities of Daily Living In your present state of health, do you have any difficulty performing the following activities: 10/16/2021  Hearing? N  Vision? N  Difficulty concentrating or making decisions? N  Walking or climbing stairs? N  Dressing or bathing? N  Doing errands, shopping? N  Preparing Food and eating ? N  Using the Toilet? N  In the past six months, have you accidently leaked urine? N  Do you have problems with loss of bowel control? N  Managing your Medications? N  Managing your Finances? N  Housekeeping or managing your Housekeeping? N  Some recent data might be hidden    Patient Care Team: Laurey Morale, MD as PCP - General  Indicate any recent Medical Services you may have received from other than [facility] providers in the past year (date may be approximate).     Assessment:   This is a routine wellness examination for [place].  Hearing/Vision screen Vision Screening - Comments:: Regular eye exams, Dr. Katy Fitch  Dietary issues and exercise activities discussed: Current Exercise Habits: The patient does not participate in regular exercise at present   Goals Addressed             This Visit's Progress    Patient Stated       10/16/2021, lose weight and start exercise       Depression Screen [facility] 2/9 Scores 10/16/2021 10/03/2020 09/03/2018 11/09/2014  PHQ - 2 Score 1 1  1 0    Fall Risk Fall Risk  10/16/2021 04/23/2021 10/03/2020 09/03/2018 11/09/2014  Falls in the past year? 0 1 0 0 No  Number falls in past yr: - 0 0 - -  Injury with Fall? - 0 0 - -  Risk for fall due to : Medication side effect No Fall Risks Impaired vision - -  Follow up Falls evaluation completed;Education provided;Falls prevention discussed Follow up appointment Falls prevention discussed - -  Comment - [facility] - - -    FALL RISK PREVENTION PERTAINING TO THE HOME:  Any stairs in or around the home? Yes  If so, are there any without handrails? No  Home free of loose throw rugs in walkways, pet beds, electrical cords, etc? Yes  Adequate lighting in your home to reduce risk of falls? Yes   ASSISTIVE DEVICES UTILIZED TO PREVENT FALLS:  Life alert? No  Use of a cane, walker or w/c? No  Grab bars in the bathroom? Yes  Shower chair or bench in shower? Yes  Elevated toilet seat or a handicapped toilet? Yes   TIMED UP AND GO:  Was the test performed? No .      Cognitive Function: MMSE - Mini Mental State Exam 09/03/2018  Not completed: (No Data)     6CIT Screen 10/16/2021 10/03/2020  What Year? 0 points 0 points  What month? 0 points 0 points  What time? 0 points -  Count back from 20 0  points 0 points  Months in reverse 0 points 4 points  Repeat phrase 4 points 0 points  Total Score 4 -    Immunizations Immunization History  Administered Date(s) Administered   Fluad Quad(high Dose 65+) 07/30/2019, 09/27/2020, 08/29/2021   Influenza, High Dose Seasonal PF 11/17/2015, 10/18/2016, 10/23/2017, 07/28/2018   Moderna Covid-19 Vaccine Bivalent Booster 27yrs & up 08/14/2021   Moderna SARS-COV2 Booster Vaccination 08/31/2020, 05/04/2021   Moderna Sars-Covid-2 Vaccination 08/31/2020    TDAP status: Due, Education has been provided regarding the importance of this vaccine. Advised may receive this vaccine at local pharmacy or Health Dept. Aware to provide a copy of the vaccination record if obtained from local pharmacy or Health Dept. Verbalized acceptance and understanding.  Flu Vaccine status: Up to date  Pneumococcal vaccine status: Declined,  Education has been provided regarding the importance of this vaccine but patient still declined. Advised may receive this vaccine at local pharmacy or Health Dept. Aware to provide a copy of the vaccination record if obtained from local pharmacy or Health Dept. Verbalized acceptance and understanding.   Covid-19 vaccine status: Completed vaccines  Qualifies for Shingles Vaccine? Yes   Zostavax completed No   Shingrix Completed?: No.    Education has been provided regarding the importance of this vaccine. Patient has been advised to call insurance company to determine out of pocket expense if they have not yet received this vaccine. Advised may also receive vaccine at local pharmacy or Health Dept. Verbalized acceptance and understanding.  Screening Tests Health Maintenance  Topic Date Due   COLONOSCOPY (Pts 45-25yrs Insurance coverage will need to be confirmed)  Never done   MAMMOGRAM  Never done   Zoster Vaccines- Shingrix (1 of 2) Never done   COVID-19 Vaccine (2 - Moderna series) 11/01/2021 (Originally 08/14/2021)   Pneumonia  Vaccine 28+ Years old (1 - PCV) 10/16/2022 (Originally 07/15/2012)   TETANUS/TDAP  10/16/2022 (Originally 07/15/1965)   INFLUENZA VACCINE  Completed   DEXA SCAN  Completed  Hepatitis C Screening  Completed   HPV VACCINES  Aged Out    Health Maintenance  Health Maintenance Due  Topic Date Due   COLONOSCOPY (Pts 45-79yrs Insurance coverage will need to be confirmed)  Never done   MAMMOGRAM  Never done   Zoster Vaccines- Shingrix (1 of 2) Never done    Colorectal cancer screening: patient to call back to schedule   Mammogram status: scheduled for 10/23/2021  Bone Density status: Completed 04/25/2020.   Lung Cancer Screening: (Low Dose CT Chest recommended if Age 73-80 years, 30 pack-year currently smoking OR have quit w/in 15years.) does not qualify.   Lung Cancer Screening Referral: no  Additional Screening:  Hepatitis C Screening: does qualify; Completed 09/03/2018  Vision Screening: Recommended annual ophthalmology exams for early detection of glaucoma and other disorders of the eye. Is the patient up to date with their annual eye exam?  Yes  Who is the provider or what is the name of the office in which the patient attends annual eye exams? Dr. Katy Fitch If pt is not established with a provider, would they like to be referred to a provider to establish care? No .   Dental Screening: Recommended annual dental exams for proper oral hygiene  Community Resource Referral / Chronic Care Management: CRR required this visit?  No   CCM required this visit?  No      Plan:     I have personally reviewed and noted the following in the patients chart:   Medical and social history Use of alcohol, tobacco or illicit drugs  Current medications and supplements including opioid prescriptions.  Functional ability and status Nutritional status Physical activity Advanced directives List of other physicians Hospitalizations, surgeries, and ER visits in previous 12  months Vitals Screenings to include cognitive, depression, and falls Referrals and appointments  In addition, I have reviewed and discussed with patient certain preventive protocols, quality metrics, and best practice recommendations. A written personalized care plan for preventive services as well as general preventive health recommendations were provided to patient.     Kellie Simmering, LPN   11/16/3005   Nurse Notes: none

## 2021-10-16 NOTE — Patient Instructions (Signed)
Anne [name] , Thank you for taking time to come for your Medicare Wellness Visit. I appreciate your ongoing commitment to your health goals. Please review the following plan we discussed and let me know if I can assist you in the future.   Screening recommendations/referrals: Colonoscopy: patient to call and schedule Mammogram: scheduled for 10/23/2021 Bone Density: completed 04/25/2020 Recommended yearly ophthalmology/optometry visit for glaucoma screening and checkup Recommended yearly dental visit for hygiene and checkup  Vaccinations: Influenza vaccine: completed 08/29/2021 Pneumococcal vaccine: decline Tdap vaccine: due Shingles vaccine: discussed   Covid-19: 08/14/2021, 05/04/2021, 08/31/2020  Advanced directives: Advance directive discussed with you today. I have provided a copy for you to complete at home and have notarized. Once this is complete please bring a copy in to our office so we can scan it into your chart.  Conditions/risks identified: none  Next appointment: Follow up in one year for your annual wellness visit    Preventive Care 65 Years and Older, Female Preventive care refers to lifestyle choices and visits with your health care provider that can promote health and wellness. What does preventive care include? A yearly physical exam. This is also called an annual well check. Dental exams once or twice a year. Routine eye exams. Ask your health care provider how often you should have your eyes checked. Personal lifestyle choices, including: Daily care of your teeth and gums. Regular physical activity. Eating a healthy diet. Avoiding tobacco and drug use. Limiting alcohol use. Practicing safe sex. Taking low-dose aspirin every day. Taking vitamin and mineral supplements as recommended by your health care provider. What happens during an annual well check? The services and screenings done by your health care provider during your annual well check will depend on  your age, overall health, lifestyle risk factors, and family history of disease. Counseling  Your health care provider may ask you questions about your: Alcohol use. Tobacco use. Drug use. Emotional well-being. Home and relationship well-being. Sexual activity. Eating habits. History of falls. Memory and ability to understand (cognition). Work and work Statistician. Reproductive health. Screening  You may have the following tests or measurements: Height, weight, and BMI. Blood pressure. Lipid and cholesterol levels. These may be checked every 5 years, or more frequently if you are over 24 years old. Skin check. Lung cancer screening. You may have this screening every year starting at age 66 if you have a 30-pack-year history of smoking and currently smoke or have quit within the past 15 years. Fecal occult blood test (FOBT) of the stool. You may have this test every year starting at age 49. Flexible sigmoidoscopy or colonoscopy. You may have a sigmoidoscopy every 5 years or a colonoscopy every 10 years starting at age 3. Hepatitis C blood test. Hepatitis B blood test. Sexually transmitted disease (STD) testing. Diabetes screening. This is done by checking your blood sugar (glucose) after you have not eaten for a while (fasting). You may have this done every 1-3 years. Bone density scan. This is done to screen for osteoporosis. You may have this done starting at age 70. Mammogram. This may be done every 1-2 years. Talk to your health care provider about how often you should have regular mammograms. Talk with your health care provider about your test results, treatment options, and if necessary, the need for more tests. Vaccines  Your health care provider may recommend certain vaccines, such as: Influenza vaccine. This is recommended every year. Tetanus, diphtheria, and acellular pertussis (Tdap, Td) vaccine. You may need  a Td booster every 10 years. Zoster vaccine. You may need this  after age 65. Pneumococcal 13-valent conjugate (PCV13) vaccine. One dose is recommended after age 6. Pneumococcal polysaccharide (PPSV23) vaccine. One dose is recommended after age 56. Talk to your health care provider about which screenings and vaccines you need and how often you need them. This information is not intended to replace advice given to you by your health care provider. Make sure you discuss any questions you have with your health care provider. Document Released: 10/27/2015 Document Revised: 06/19/2016 Document Reviewed: 08/01/2015 Elsevier Interactive Patient Education  2017 [employer] Prevention in the Home Falls can cause injuries. They can happen to people of all ages. There are many things you can do to make your home safe and to help prevent falls. What can I do on the outside of my home? Regularly fix the edges of walkways and driveways and fix any cracks. Remove anything that might make you trip as you walk through a door, such as a raised step or threshold. Trim any bushes or trees on the path to your home. Use bright outdoor lighting. Clear any walking paths of anything that might make someone trip, such as rocks or tools. Regularly check to see if handrails are loose or broken. Make sure that both sides of any steps have handrails. Any raised decks and porches should have guardrails on the edges. Have any leaves, snow, or ice cleared regularly. Use sand or salt on walking paths during winter. Clean up any spills in your garage right away. This includes oil or grease spills. What can I do in the bathroom? Use night lights. Install grab bars by the toilet and in the tub and shower. Do not use towel bars as grab bars. Use non-skid mats or decals in the tub or shower. If you need to sit down in the shower, use a plastic, non-slip stool. Keep the floor dry. Clean up any water that spills on the floor as soon as it happens. Remove soap buildup in the tub or  shower regularly. Attach bath mats securely with double-sided non-slip rug tape. Do not have throw rugs and other things on the floor that can make you trip. What can I do in the bedroom? Use night lights. Make sure that you have a light by your bed that is easy to reach. Do not use any sheets or blankets that are too big for your bed. They should not hang down onto the floor. Have a firm chair that has side arms. You can use this for support while you get dressed. Do not have throw rugs and other things on the floor that can make you trip. What can I do in the kitchen? Clean up any spills right away. Avoid walking on wet floors. Keep items that you use a lot in easy-to-reach places. If you need to reach something above you, use a strong step stool that has a grab bar. Keep electrical cords out of the way. Do not use floor polish or wax that makes floors slippery. If you must use wax, use non-skid floor wax. Do not have throw rugs and other things on the floor that can make you trip. What can I do with my stairs? Do not leave any items on the stairs. Make sure that there are handrails on both sides of the stairs and use them. Fix handrails that are broken or loose. Make sure that handrails are as long as the stairways. Check  any carpeting to make sure that it is firmly attached to the stairs. Fix any carpet that is loose or worn. Avoid having throw rugs at the top or bottom of the stairs. If you do have throw rugs, attach them to the floor with carpet tape. Make sure that you have a light switch at the top of the stairs and the bottom of the stairs. If you do not have them, ask someone to add them for you. What else can I do to help prevent falls? Wear shoes that: Do not have high heels. Have rubber bottoms. Are comfortable and fit you well. Are closed at the toe. Do not wear sandals. If you use a stepladder: Make sure that it is fully opened. Do not climb a closed stepladder. Make  sure that both sides of the stepladder are locked into place. Ask someone to hold it for you, if possible. Clearly mark and make sure that you can see: Any grab bars or handrails. First and last steps. Where the edge of each step is. Use tools that help you move around (mobility aids) if they are needed. These include: Canes. Walkers. Scooters. Crutches. Turn on the lights when you go into a dark area. Replace any light bulbs as soon as they burn out. Set up your furniture so you have a clear path. Avoid moving your furniture around. If any of your floors are uneven, fix them. If there are any pets around you, be aware of where they are. Review your medicines with your doctor. Some medicines can make you feel dizzy. This can increase your chance of falling. Ask your doctor what other things that you can do to help prevent falls. This information is not intended to replace advice given to you by your health care provider. Make sure you discuss any questions you have with your health care provider. Document Released: 07/27/2009 Document Revised: 03/07/2016 Document Reviewed: 11/04/2014 Elsevier Interactive Patient Education  2017 [employer].

## 2021-10-23 ENCOUNTER — Ambulatory Visit: Payer: Medicare Other

## 2021-10-24 ENCOUNTER — Other Ambulatory Visit: Payer: Self-pay

## 2021-10-24 ENCOUNTER — Ambulatory Visit
Admission: RE | Admit: 2021-10-24 | Discharge: 2021-10-24 | Disposition: A | Discharge status: Home / Self Care | Payer: Medicare Other | Source: Ambulatory Visit | Attending: Family Medicine | Admitting: Family Medicine

## 2021-10-24 DIAGNOSIS — Z1231 Encounter for screening mammogram for malignant neoplasm of breast: Secondary | ICD-10-CM

## 2021-10-26 ENCOUNTER — Other Ambulatory Visit: Payer: Self-pay | Admitting: Family Medicine

## 2021-10-26 ENCOUNTER — Telehealth: Payer: Self-pay | Admitting: Family Medicine

## 2021-10-26 DIAGNOSIS — R928 Other abnormal and inconclusive findings on diagnostic imaging of breast: Secondary | ICD-10-CM

## 2021-10-26 NOTE — Telephone Encounter (Signed)
Mammogram results on 10/24/2021.  Please advise

## 2021-10-26 NOTE — Telephone Encounter (Signed)
Pt is calling and had mammogram at [facility] center on 10-23-2021 and must have another mammogram ?diagnostic and she does not know the reason per pt they told her to call dr fry to discuss  why she needs a repeat mammogram

## 2021-10-26 NOTE — Telephone Encounter (Signed)
This is very common and does not mean something is wrong. They are taking some more detailed pictures of her breasts as wll as [place] to get a closer look

## 2021-10-28 ENCOUNTER — Other Ambulatory Visit: Payer: Self-pay | Admitting: Family Medicine

## 2021-10-29 NOTE — Telephone Encounter (Signed)
Left detailed message for pt to call the office regarding message on Breast US

## 2021-11-02 NOTE — Telephone Encounter (Signed)
Spoke with pt verbalized understanding of Dr Fry advise 

## 2021-11-13 ENCOUNTER — Other Ambulatory Visit: Payer: Medicare Other

## 2021-11-16 ENCOUNTER — Ambulatory Visit: Payer: Medicare Other

## 2021-11-16 ENCOUNTER — Ambulatory Visit
Admission: RE | Admit: 2021-11-16 | Discharge: 2021-11-16 | Disposition: A | Discharge status: Home / Self Care | Payer: Medicare Other | Source: Ambulatory Visit | Attending: Family Medicine | Admitting: Family Medicine

## 2021-11-16 ENCOUNTER — Other Ambulatory Visit: Payer: Self-pay

## 2021-11-16 DIAGNOSIS — R928 Other abnormal and inconclusive findings on diagnostic imaging of breast: Secondary | ICD-10-CM | POA: Diagnosis not present

## 2021-12-07 ENCOUNTER — Other Ambulatory Visit: Payer: Self-pay | Admitting: Family Medicine

## 2021-12-08 ENCOUNTER — Other Ambulatory Visit: Payer: Self-pay | Admitting: Family Medicine

## 2021-12-12 ENCOUNTER — Other Ambulatory Visit: Payer: Self-pay | Admitting: Family Medicine

## 2021-12-26 ENCOUNTER — Ambulatory Visit: Payer: Medicare Other | Admitting: Family Medicine

## 2021-12-30 NOTE — Progress Notes (Signed)
?Cardiology Office Note:   ? ?Date:  12/31/2021  ? ?ID:  Anne [name], DOB 09-30-1946, MRN [id number] ? ?PCP:  Laurey Morale, MD  ?Cardiologist:  None  ? ?Referring MD: Laurey Morale, MD  ? ?Chief Complaint  ?Patient presents with  ? Coronary Artery Disease  ? Cardiac Valve Problem  ?  Aortic stenosis  ? ? ?History of Present Illness:   ? ?Anne [name] is a 76 y.o. female with a hx of lightheadedness, prediabetes, mild aortic stenosis, asymptomatic coronary atherosclerosis with elevated coronary calcium score.. ? ?There have been no communications in the interval since her visit in October. ? ?Blurred vision and wonders if it could be related to statin therapy. ? ? ?She is concerned about her cardiovascular risk factors and is worried about having to have heart surgery or the possibility of a stroke in the future.  I tried to console her with information that treating risk factors decreases the possibility of vascular events. ? ?Encouraged aerobic activity. ? ?When first seen by me in September she was on simvastatin.  Somehow she is now on pravastatin 20 mg 3 times per week because of complaints I believe.  She is not able to tell me what happened to cause that transition. ? ?Past Medical History:  ?Diagnosis Date  ? Hyperlipidemia   ? Hypertension   ? Osteopenia   ? per DEXA on 07-19-09  ? Stroke [facility]) 09-2007  ? has mild left leg weakness, saw Dr. Brett Fairy  ? ? ?History reviewed. No pertinent surgical history. ? ?Current Medications: ?Current Meds  ?Medication Sig  ? acetaminophen (TYLENOL) 325 MG tablet Take 650 mg by mouth every 6 (six) hours as needed for mild pain.  ? amLODipine (NORVASC) 10 MG tablet TAKE 1 TABLET BY MOUTH EVERY DAY  ? aspirin 81 MG tablet Take 81 mg by mouth daily.  ? benzonatate (TESSALON) 100 MG capsule Take 1 capsule (100 mg total) by mouth every 8 (eight) hours.  ? Calcium-Vitamin D (CALTRATE 600 PLUS-VIT D PO) Take 1 tablet by mouth daily.  ? cefdinir (OMNICEF) 300 MG capsule  Take 1 capsule (300 mg total) by mouth 2 (two) times daily.  ? COVID-19 mRNA bivalent vaccine, Moderna, (MODERNA COVID-19 BIVAL BOOSTER) 50 MCG/0.5ML injection Inject into the muscle.  ? COVID-19 mRNA vaccine, Moderna, (MODERNA COVID-19 VACCINE) 100 MCG/0.5ML injection Inject into the muscle.  ? hydrochlorothiazide (HYDRODIURIL) 25 MG tablet TAKE 1 TABLET BY MOUTH EVERY DAY  ? ibuprofen (ADVIL) 800 MG tablet Take 800 mg by mouth every 6 (six) hours as needed.  ? KLOR-CON M20 20 MEQ tablet TAKE 1 TABLET BY MOUTH EVERY DAY  ? loratadine (CLARITIN) 10 MG tablet Take 1 tablet (10 mg total) by mouth daily.  ? losartan (COZAAR) 50 MG tablet TAKE 1 TABLET BY MOUTH EVERY DAY  ? meclizine (ANTIVERT) 25 MG tablet Take 1 tablet (25 mg total) by mouth every 4 (four) hours as needed for dizziness.  ? Multiple Vitamins-Minerals (CENTRUM SILVER PO) Take 1 tablet by mouth daily.  ? omeprazole (PRILOSEC) 40 MG capsule Take 1 capsule (40 mg total) by mouth daily.  ? pravastatin (PRAVACHOL) 20 MG tablet Take 1 tablet (20 mg total) by mouth every Monday, Wednesday, and Friday.  ? promethazine (PHENERGAN) 25 MG tablet Take 1 tablet (25 mg total) by mouth every 4 (four) hours as needed for nausea or vomiting.  ? vitamin C (ASCORBIC ACID) 500 MG tablet Take 500 mg by mouth daily.  ?  ? ?  Allergies:   Iodine and Latex  ? ?Social History  ? ?Socioeconomic History  ? Marital status: Single  ?  Spouse name: Not on file  ? Number of children: Not on file  ? Years of education: Not on file  ? Highest education level: Not on file  ?Occupational History  ? Occupation: part Doctor, general practice  ?Tobacco Use  ? Smoking status: Never  ? Smokeless tobacco: Never  ?Vaping Use  ? Vaping Use: Never used  ?Substance and Sexual Activity  ? Alcohol use: Not Currently  ?  Comment: rare  ? Drug use: No  ? Sexual activity: Not Currently  ?Other Topics Concern  ? Not on file  ?Social History Narrative  ? Not on file  ? ?Social Determinants of Health  ? ?Financial  Resource Strain: Low Risk   ? Difficulty of Paying Living Expenses: Not hard at all  ?Food Insecurity: No Food Insecurity  ? Worried About Charity fundraiser in the Last Year: Never true  ? Ran Out of Food in the Last Year: Never true  ?Transportation Needs: No Transportation Needs  ? Lack of Transportation (Medical): No  ? Lack of Transportation (Non-Medical): No  ?Physical Activity: Inactive  ? Days of Exercise per Week: 0 days  ? Minutes of Exercise per Session: 0 min  ?Stress: Stress Concern Present  ? Feeling of Stress : To some extent  ?Social Connections: Not on file  ?  ? ?Family History: ?The patient's Family history is unknown by patient. ? ?ROS:   ?Please see the history of present illness.    ?No cardiovascular complaints.  Lifestyle has been sedentary due to the pandemic.  Still afraid to go out and do things.  All other systems reviewed and are negative. ? ?EKGs/Labs/Other Studies Reviewed:   ? ?The following studies were reviewed today: ?LABORATORY data: ?June 2022 hemoglobin A1c 6.3. ? ?CORONARY CALCIUM SCORE 07/19/2021: ?IMPRESSION: ?1. Coronary calcium score of 176. This was 79th percentile for age-, ?race-, and sex-matched controls. ?2. Mitral and aortic annular calcification. Mild calcification of ?the aortic valve leaflet bases. ?3. Aortic atherosclerosis. ? ?ECHOCARDIOGRAPHY 07/19/2021: ?IMPRESSIONS  ? 1. Left ventricular ejection fraction, by estimation, is 60 to 65%. The  ?left ventricle has normal function. The left ventricle has no regional  ?wall motion abnormalities. Left ventricular diastolic parameters are  ?consistent with Grade I diastolic  ?dysfunction (impaired relaxation).  ? 2. Right ventricular systolic function was not well visualized. The right  ?ventricular size is normal. There is mildly elevated pulmonary artery  ?systolic pressure. The estimated right ventricular systolic pressure is  ?19.5 mmHg.  ? 3. Right atrial size was mildly dilated.  ? 4. The mitral valve is normal  in structure. Trivial mitral valve  ?regurgitation. No evidence of mitral stenosis.  ? 5. Tricuspid valve regurgitation is mild to moderate.  ? 6. The aortic valve is calcified. There is moderate calcification of the  ?aortic valve. There is moderate thickening of the aortic valve. Aortic  ?valve regurgitation is mild. Mild aortic valve stenosis. Aortic  ?regurgitation PHT measures 650 msec. Aortic  ?valve area, by VTI measures 1.73 cm?Marland Kitchen Aortic valve mean gradient measures  ?21.0 mmHg. Aortic valve Vmax measures 2.82 m/s.  ? 7. The inferior vena cava is normal in size with greater than 50%  ?respiratory variability, suggesting right atrial pressure of 3 mmHg.  ? ?EKG:  EKG not repeated ? ?Recent Labs: ?03/19/2021: TSH 2.96 ?03/23/2021: B Natriuretic Peptide 32.3 ?07/16/2021: ALT 18;  BUN 13; Creatinine, Ser 0.68; Hemoglobin 13.9; Platelets 454; Potassium 3.4; Sodium 139  ?Recent Lipid Panel ?   ?Component Value Date/Time  ? CHOL 234 (H) 03/19/2021 1058  ? TRIG 146.0 03/19/2021 1058  ? HDL 49.20 03/19/2021 1058  ? CHOLHDL 5 03/19/2021 1058  ? VLDL 29.2 03/19/2021 1058  ? LDLCALC 156 (H) 03/19/2021 1058  ? LDLDIRECT 158.7 06/04/2013 1024  ? ? ?Physical Exam:   ? ?VS:  BP 132/68   Pulse 61   Ht 5' 1.5" (1.562 m)   Wt 163 lb 12.8 oz (74.3 kg)   SpO2 98%   BMI 30.45 kg/m?    ? ?Wt Readings from Last 3 Encounters:  ?12/31/21 163 lb 12.8 oz (74.3 kg)  ?10/16/21 163 lb 4.8 oz (74.1 kg)  ?08/29/21 162 lb 2 oz (73.5 kg)  ?  ? ?GEN: Slightly overweight. No acute distress ?HEENT: Normal ?NECK: No JVD. ?LYMPHATICS: No lymphadenopathy ?CARDIAC: 3/6 right upper sternal systolic diamond-shaped aortic stenosis murmur. RRR S4 gallop, but no edema. ?VASCULAR:  Normal Pulses. No bruits. ?RESPIRATORY:  Clear to auscultation without rales, wheezing or rhonchi  ?ABDOMEN: Soft, non-tender, non-distended, No pulsatile mass, ?MUSCULOSKELETAL: No deformity  ?SKIN: Warm and dry ?NEUROLOGIC:  Alert and oriented x 3 ?PSYCHIATRIC:  Normal affect   ? ?ASSESSMENT:   ? ?1. Coronary artery disease involving native coronary artery of native heart without angina pectoris   ?2. Aortic valve stenosis, etiology of cardiac valve disease unspecified   ?3. Essential

## 2021-12-31 ENCOUNTER — Other Ambulatory Visit: Payer: Self-pay

## 2021-12-31 ENCOUNTER — Ambulatory Visit (INDEPENDENT_AMBULATORY_CARE_PROVIDER_SITE_OTHER): Payer: Medicare Other | Admitting: Interventional Cardiology

## 2021-12-31 ENCOUNTER — Encounter: Payer: Self-pay | Admitting: Interventional Cardiology

## 2021-12-31 ENCOUNTER — Other Ambulatory Visit: Payer: Self-pay | Admitting: *Deleted

## 2021-12-31 VITALS — BP 132/68 | HR 61 | Ht 61.5 in | Wt 163.8 lb

## 2021-12-31 DIAGNOSIS — E782 Mixed hyperlipidemia: Secondary | ICD-10-CM | POA: Diagnosis not present

## 2021-12-31 DIAGNOSIS — I491 Atrial premature depolarization: Secondary | ICD-10-CM

## 2021-12-31 DIAGNOSIS — I350 Nonrheumatic aortic (valve) stenosis: Secondary | ICD-10-CM | POA: Diagnosis not present

## 2021-12-31 DIAGNOSIS — I2510 Atherosclerotic heart disease of native coronary artery without angina pectoris: Secondary | ICD-10-CM

## 2021-12-31 DIAGNOSIS — I10 Essential (primary) hypertension: Secondary | ICD-10-CM | POA: Diagnosis not present

## 2021-12-31 DIAGNOSIS — R7303 Prediabetes: Secondary | ICD-10-CM

## 2021-12-31 DIAGNOSIS — I5189 Other ill-defined heart diseases: Secondary | ICD-10-CM

## 2021-12-31 LAB — LIPID PANEL
Chol/HDL Ratio: 3.7 ratio (ref 0.0–4.4)
Cholesterol, Total: 221 mg/dL — ABNORMAL HIGH (ref 100–199)
HDL: 59 mg/dL (ref >39)
LDL Chol Calc (NIH): 142 mg/dL — ABNORMAL HIGH (ref 0–99)
Triglycerides: 115 mg/dL (ref 0–149)
VLDL Cholesterol Cal: 20 mg/dL (ref 5–40)

## 2021-12-31 NOTE — Addendum Note (Signed)
Addended by: Loren Racer on: 12/31/2021 11:38 AM ? ? Modules accepted: Orders ? ?

## 2021-12-31 NOTE — Patient Instructions (Signed)
Medication Instructions:  ?Your physician recommends that you continue on your current medications as directed. Please refer to the Current Medication list given to you today. ? ?*If you need a refill on your cardiac medications before your next appointment, please call your pharmacy* ? ? ?Lab Work: ?Lipid panel today ? ?If you have labs (blood work) drawn today and your tests are completely normal, you will receive your results only by: ?MyChart Message (if you have MyChart) OR ?A paper copy in the mail ?If you have any lab test that is abnormal or we need to change your treatment, we will call you to review the results. ? ? ?Testing/Procedures: ?Your physician has requested that you have an echocardiogram anytime in October-December. Echocardiography is a painless test that uses sound waves to create images of your heart. It provides your doctor with information about the size and shape of your heart and how well your heart?s chambers and valves are working. This procedure takes approximately one hour. There are no restrictions for this procedure. ? ? ? ?Follow-Up: ? ?Your physician recommends that you schedule a follow-up appointment next available with the [facility]. ? ?At [facility], you and your health needs are our priority.  As part of our continuing mission to provide you with exceptional heart care, we have created designated Provider Care Teams.  These Care Teams include your primary Cardiologist (physician) and Advanced Practice Providers (APPs -  Physician Assistants and Nurse Practitioners) who all work together to provide you with the care you need, when you need it. ? ?We recommend signing up for the patient portal called "MyChart".  Sign up information is provided on this After Visit Summary.  MyChart is used to connect with patients for Virtual Visits (Telemedicine).  Patients are able to view lab/test results, encounter notes, upcoming appointments, etc.  Non-urgent messages can be sent to  your provider as well.   ?To learn more about what you can do with MyChart, go to [email or web address].   ? ?Your next appointment:   ?After echocardiogram ? ?The format for your next appointment:   ?In Person ? ?Provider:   ?[facility]. Blenda Bridegroom, MD  ? ?

## 2022-01-03 ENCOUNTER — Telehealth: Payer: Self-pay | Admitting: Interventional Cardiology

## 2022-01-03 NOTE — Telephone Encounter (Signed)
Informed pt of results. Pt verbalized understanding. 

## 2022-01-03 NOTE — Telephone Encounter (Signed)
Patient returned call for lab results.  

## 2022-01-04 ENCOUNTER — Other Ambulatory Visit: Payer: Self-pay

## 2022-01-04 ENCOUNTER — Ambulatory Visit (INDEPENDENT_AMBULATORY_CARE_PROVIDER_SITE_OTHER): Payer: Medicare Other | Admitting: Pharmacist

## 2022-01-04 DIAGNOSIS — E782 Mixed hyperlipidemia: Secondary | ICD-10-CM

## 2022-01-04 MED ORDER — ROSUVASTATIN CALCIUM 5 MG PO TABS: 5.0000 mg | ORAL_TABLET | Freq: Every day | ORAL | 3 refills | Status: DC

## 2022-01-04 NOTE — Patient Instructions (Addendum)
Start walking daily ?STOP pravastatin  ?Start taking rosuvastatin '5mg'$  daily ?Please call me at [phone] with any questions ?Fasting labs on Fri 6/16. Lab opens at 7:15 ? ?

## 2022-01-04 NOTE — Progress Notes (Signed)
Patient ID: Anne [name]                 DOB: 02-17-1946                    MRN: [id number] ? ? ? ? ?HPI: ?AYANNA [name] is a 76 y.o. female patient referred to lipid clinic by Dr. Tamala Julian. PMH is significant for lightheadedness, prediabetes, mild aortic stenosis, asymptomatic coronary atherosclerosis with elevated coronary calcium score, stroke. Coronary calcium score of 176 in 2022. CVA per chart.  ?She was previously on simvastatin. Currently on pravastatin '20mg'$  three times a week.  ? ?Patient presents today to lipid clinic. She is a worried about her health. Always tries to eat the right thing- taught nutrition classes for years. Concerned she isn't doing the right thing. Has done ok on pravastatin other than blurry vision the first day she took it and her 2 fingers on one hand hurt. Has been worried about COVID and has not been to the gym since then. Does not drink alcohol every day. But does sometimes go to the [facility] and have 2 glasses of wine. ? ?Current Medications:  ?Intolerances: simvastatin (leg pain) ?Risk Factors: TIA, HTN ?LDL-C goal: <70 ?Apo - B goal <80 ? ?Diet: breakfast: oatmeal with banana apple raisins ?Lunch: salad w/ piece of chicken ?Dinner: lots of vegetables, chicken vegetable soup, chicken, chili beans with brown rice, cooked cabbage ?Drink: water ? ?Exercise: use to go to the gym, hasn't done anything in awhile  ? ?Family History: ?Family History  ?Family history unknown: Yes  ? ?Social History: never smoked, 2 glasses of wine twice a week ? ?Labs: 12/31/21 TC 221, TG 115, HDL 59, LDL-C 142 (pravastatin '20mg'$  MWF) ? ?Past Medical History:  ?Diagnosis Date  ? Hyperlipidemia   ? Hypertension   ? Osteopenia   ? per DEXA on 07-19-09  ? Stroke [facility]) 09-2007  ? has mild left leg weakness, saw Dr. Brett Fairy  ? ? ?Current Outpatient Medications on File Prior to Visit  ?Medication Sig Dispense Refill  ? acetaminophen (TYLENOL) 325 MG tablet Take 650 mg by mouth every 6 (six) hours as needed  for mild pain.    ? amLODipine (NORVASC) 10 MG tablet TAKE 1 TABLET BY MOUTH EVERY DAY 90 tablet 2  ? aspirin 81 MG tablet Take 81 mg by mouth daily.    ? benzonatate (TESSALON) 100 MG capsule Take 1 capsule (100 mg total) by mouth every 8 (eight) hours. 21 capsule 0  ? Calcium-Vitamin D (CALTRATE 600 PLUS-VIT D PO) Take 1 tablet by mouth daily.    ? cefdinir (OMNICEF) 300 MG capsule Take 1 capsule (300 mg total) by mouth 2 (two) times daily. 20 capsule 0  ? COVID-19 mRNA bivalent vaccine, Moderna, (MODERNA COVID-19 BIVAL BOOSTER) 50 MCG/0.5ML injection Inject into the muscle. 0.5 mL 0  ? COVID-19 mRNA vaccine, Moderna, (MODERNA COVID-19 VACCINE) 100 MCG/0.5ML injection Inject into the muscle. 0.25 mL 0  ? hydrochlorothiazide (HYDRODIURIL) 25 MG tablet TAKE 1 TABLET BY MOUTH EVERY DAY 90 tablet 0  ? ibuprofen (ADVIL) 800 MG tablet Take 800 mg by mouth every 6 (six) hours as needed.    ? KLOR-CON M20 20 MEQ tablet TAKE 1 TABLET BY MOUTH EVERY DAY 90 tablet 3  ? loratadine (CLARITIN) 10 MG tablet Take 1 tablet (10 mg total) by mouth daily. 30 tablet 11  ? losartan (COZAAR) 50 MG tablet TAKE 1 TABLET BY MOUTH EVERY DAY 90  tablet 0  ? meclizine (ANTIVERT) 25 MG tablet Take 1 tablet (25 mg total) by mouth every 4 (four) hours as needed for dizziness. 60 tablet 2  ? Multiple Vitamins-Minerals (CENTRUM SILVER PO) Take 1 tablet by mouth daily.    ? omeprazole (PRILOSEC) 40 MG capsule Take 1 capsule (40 mg total) by mouth daily. 30 capsule 3  ? pravastatin (PRAVACHOL) 20 MG tablet Take 1 tablet (20 mg total) by mouth every Monday, Wednesday, and Friday. 45 tablet 3  ? promethazine (PHENERGAN) 25 MG tablet Take 1 tablet (25 mg total) by mouth every 4 (four) hours as needed for nausea or vomiting. 60 tablet 0  ? vitamin C (ASCORBIC ACID) 500 MG tablet Take 500 mg by mouth daily.    ? ?No current facility-administered medications on file prior to visit.  ? ? ?Allergies  ?Allergen Reactions  ? Iodine Itching and Other (See  Comments)  ?  "lump in tongue when eats seafood"  ? Latex Itching  ?  GLOVES  ? ? ?Assessment/Plan: ? ?1. Hyperlipidemia - LDL-C is above goal of <70. Patient and I had a long discussion about the risk factors for arthrosclerosis that are modifiable. Patient eats a good diet. She states she still wants to work on making it better. Her BP is pretty well controlled and she has never smoked. She is not exercising. I encouraged her to start walking everyday. We did talk about how exercise and diet can have an effect on cholesterol and are very important, but with her history of a TIA and CAC score, we are targeting a lower LDL-C and ApoB than she may be able to achieve. This is where medications, such as statins, that can stabilize plaque come in. Patient is agreeable to STOP pravastatin and START rosuvastatin '5mg'$  daily. Recheck lipid panel and ApoB in 3 months.  ? ? ?Thank you, ? ? ?Ramond Dial, Pharm.D, BCPS, CPP ?[id number] [place], [place] [address]  ?Phone: [phone]; Fax: [phone]  ? ? ?

## 2022-01-28 ENCOUNTER — Other Ambulatory Visit: Payer: Self-pay | Admitting: Family Medicine

## 2022-03-12 ENCOUNTER — Encounter: Payer: Self-pay | Admitting: Family Medicine

## 2022-03-12 ENCOUNTER — Ambulatory Visit (INDEPENDENT_AMBULATORY_CARE_PROVIDER_SITE_OTHER): Payer: Medicare Other | Admitting: Family Medicine

## 2022-03-12 VITALS — BP 144/70 | HR 62 | Temp 97.8°F | Ht 61.5 in | Wt 169.0 lb

## 2022-03-12 DIAGNOSIS — R350 Frequency of micturition: Secondary | ICD-10-CM | POA: Diagnosis not present

## 2022-03-12 DIAGNOSIS — G8929 Other chronic pain: Secondary | ICD-10-CM | POA: Diagnosis not present

## 2022-03-12 DIAGNOSIS — M25561 Pain in right knee: Secondary | ICD-10-CM | POA: Diagnosis not present

## 2022-03-12 DIAGNOSIS — L659 Nonscarring hair loss, unspecified: Secondary | ICD-10-CM

## 2022-03-12 LAB — TSH: TSH: 3.27 u[IU]/mL (ref 0.35–5.50)

## 2022-03-12 LAB — POC URINALSYSI DIPSTICK (AUTOMATED)
Bilirubin, UA: NEGATIVE
Blood, UA: NEGATIVE
Glucose, UA: NEGATIVE
Ketones, UA: NEGATIVE
Leukocytes, UA: NEGATIVE
Nitrite, UA: NEGATIVE
Protein, UA: NEGATIVE
Spec Grav, UA: 1.01 (ref 1.010–1.025)
Urobilinogen, UA: 0.2 E.U./dL
pH, UA: 7 (ref 5.0–8.0)

## 2022-03-12 LAB — CBC WITH DIFFERENTIAL/PLATELET
Basophils Absolute: 0.1 10*3/uL (ref 0.0–0.1)
Basophils Relative: 0.7 % (ref 0.0–3.0)
Eosinophils Absolute: 0.3 10*3/uL (ref 0.0–0.7)
Eosinophils Relative: 3 % (ref 0.0–5.0)
HCT: 39.5 % (ref 36.0–46.0)
Hemoglobin: 13.4 g/dL (ref 12.0–15.0)
Lymphocytes Relative: 19.9 % (ref 12.0–46.0)
Lymphs Abs: 1.9 10*3/uL (ref 0.7–4.0)
MCHC: 33.9 g/dL (ref 30.0–36.0)
MCV: 89.6 fl (ref 78.0–100.0)
Monocytes Absolute: 0.8 10*3/uL (ref 0.1–1.0)
Monocytes Relative: 8.1 % (ref 3.0–12.0)
Neutro Abs: 6.4 10*3/uL (ref 1.4–7.7)
Neutrophils Relative %: 68.3 % (ref 43.0–77.0)
Platelets: 400 10*3/uL (ref 150.0–400.0)
RBC: 4.41 Mil/uL (ref 3.87–5.11)
RDW: 13.7 % (ref 11.5–15.5)
WBC: 9.3 10*3/uL (ref 4.0–10.5)

## 2022-03-12 LAB — HEMOGLOBIN A1C: Hgb A1c MFr Bld: 6.1 % (ref 4.6–6.5)

## 2022-03-12 LAB — BASIC METABOLIC PANEL
BUN: 14 mg/dL (ref 6–23)
CO2: 31 mEq/L (ref 19–32)
Calcium: 9.7 mg/dL (ref 8.4–10.5)
Chloride: 99 mEq/L (ref 96–112)
Creatinine, Ser: 0.74 mg/dL (ref 0.40–1.20)
GFR: 79.57 mL/min (ref >60.00)
Glucose, Bld: 102 mg/dL — ABNORMAL HIGH (ref 70–99)
Potassium: 3.7 mEq/L (ref 3.5–5.1)
Sodium: 137 mEq/L (ref 135–145)

## 2022-03-12 LAB — T4, FREE: Free T4: 1.12 ng/dL (ref 0.60–1.60)

## 2022-03-12 LAB — T3, FREE: T3, Free: 3.5 pg/mL (ref 2.3–4.2)

## 2022-03-12 NOTE — Progress Notes (Signed)
   Subjective:    Patient ID: Anne [name], female    DOB: [birth year], 76 y.o.   MRN: [id number]  HPI Here for several issues. First for the past 6 months she has had a lot of hair loss. She finds it in the shower drain or on her brush. This has actually lessened in the past few weeks. Also she has been urinating more frequently in the past 6 months. There is no urgency or burning. She notes that she had stopped drinking coffee for several years, and that she had started drinking coffee again about the time this appeared. Finally she has had an intermittent pain in the anterior right knee for about a year. This comes and goes. No recent trauma. No swelling. The pain often appears during the night or when she gets out of bed. She sometimes takes Aleve and this helps.    Review of Systems  Constitutional: Negative.   Respiratory: Negative.    Cardiovascular: Negative.   Genitourinary:  Positive for frequency. Negative for dysuria, flank pain, hematuria and urgency.  Musculoskeletal:  Positive for arthralgias.      Objective:   Physical Exam Constitutional:      Appearance: Normal appearance. She is not ill-appearing.  Cardiovascular:     Rate and Rhythm: Normal rate and regular rhythm.     Pulses: Normal pulses.     Heart sounds: Normal heart sounds.  Pulmonary:     Effort: Pulmonary effort is normal.     Breath sounds: Normal breath sounds.  Musculoskeletal:     Comments: The right knee appears norma, no swelling or erythema. She is tender just inferior to the right patella. ROM is full   Neurological:     Mental Status: She is alert.          Assessment & Plan:  Her frequent urination is likely coming from a combination of taking HCTZ every day and coffee intake. We will get labs to day to rule out diabetes, etc. She plans to stop drinking coffee. For the hair loss, we will check a thyroid panel. The knee pain seems to be a prepatellar bursitis. I suggested she apply some  Voltaren 1% gel to the area at bedtime and to sleep with a pillow between the legs.  Alysia Penna, MD

## 2022-03-15 ENCOUNTER — Other Ambulatory Visit (HOSPITAL_BASED_OUTPATIENT_CLINIC_OR_DEPARTMENT_OTHER): Payer: Self-pay

## 2022-03-24 ENCOUNTER — Other Ambulatory Visit: Payer: Self-pay | Admitting: Family Medicine

## 2022-03-29 ENCOUNTER — Other Ambulatory Visit: Payer: Medicare Other | Admitting: *Deleted

## 2022-03-29 DIAGNOSIS — E782 Mixed hyperlipidemia: Secondary | ICD-10-CM

## 2022-03-30 LAB — LIPID PANEL
Chol/HDL Ratio: 3.5 ratio (ref 0.0–4.4)
Cholesterol, Total: 177 mg/dL (ref 100–199)
HDL: 50 mg/dL (ref >39)
LDL Chol Calc (NIH): 106 mg/dL — ABNORMAL HIGH (ref 0–99)
Triglycerides: 115 mg/dL (ref 0–149)
VLDL Cholesterol Cal: 21 mg/dL (ref 5–40)

## 2022-03-30 LAB — APOLIPOPROTEIN B: Apolipoprotein B: 87 mg/dL (ref <90)

## 2022-03-30 LAB — HEPATIC FUNCTION PANEL
ALT: 15 IU/L (ref 0–32)
AST: 21 IU/L (ref 0–40)
Albumin: 4.2 g/dL (ref 3.7–4.7)
Alkaline Phosphatase: 95 IU/L (ref 44–121)
Bilirubin Total: 0.5 mg/dL (ref 0.0–1.2)
Bilirubin, Direct: 0.13 mg/dL (ref 0.00–0.40)
Total Protein: 7.2 g/dL (ref 6.0–8.5)

## 2022-04-01 ENCOUNTER — Telehealth: Payer: Self-pay | Admitting: Pharmacist

## 2022-04-01 NOTE — Telephone Encounter (Signed)
Spoke with patient about her labs. LDL improved. Above goal of <70. Discussed being more aggressive. Patient admits sometimes she forgets to take and she hasn't been exercising like she should. Would like to work on exercise and compliance.

## 2022-05-26 ENCOUNTER — Ambulatory Visit (HOSPITAL_COMMUNITY)
Admission: EM | Admit: 2022-05-26 | Discharge: 2022-05-26 | Disposition: A | Discharge status: Home / Self Care | Payer: Medicare Other | Attending: Family Medicine | Admitting: Family Medicine

## 2022-05-26 ENCOUNTER — Telehealth (HOSPITAL_COMMUNITY): Payer: Self-pay | Admitting: Family Medicine

## 2022-05-26 DIAGNOSIS — Z20822 Contact with and (suspected) exposure to covid-19: Secondary | ICD-10-CM | POA: Insufficient documentation

## 2022-05-26 DIAGNOSIS — J069 Acute upper respiratory infection, unspecified: Secondary | ICD-10-CM

## 2022-05-26 LAB — POCT URINALYSIS DIPSTICK, ED / UC
Bilirubin Urine: NEGATIVE
Glucose, UA: NEGATIVE mg/dL
Ketones, ur: NEGATIVE mg/dL
Leukocytes,Ua: NEGATIVE
Nitrite: NEGATIVE
Protein, ur: NEGATIVE mg/dL
Specific Gravity, Urine: <=1.005 (ref 1.005–1.030)
Urobilinogen, UA: 0.2 mg/dL (ref 0.0–1.0)
pH: 7 (ref 5.0–8.0)

## 2022-05-26 LAB — SARS CORONAVIRUS 2 BY RT PCR: SARS Coronavirus 2 by RT PCR: POSITIVE — AB

## 2022-05-26 MED ORDER — ONDANSETRON 4 MG PO TBDP: 4.0000 mg | ORAL_TABLET | Freq: Three times a day (TID) | ORAL | 0 refills | Status: AC | PRN

## 2022-05-26 MED ORDER — BENZONATATE 100 MG PO CAPS: 100.0000 mg | ORAL_CAPSULE | Freq: Three times a day (TID) | ORAL | 0 refills | Status: DC | PRN

## 2022-05-26 MED ORDER — NIRMATRELVIR/RITONAVIR (PAXLOVID)TABLET
ORAL_TABLET | ORAL | 0 refills | Status: DC
Start: 2022-05-26 — End: 2023-03-10

## 2022-05-26 NOTE — Discharge Instructions (Addendum)
Your urine test did not show any significant abnormalities   You have been swabbed for COVID, and the test will result in the next few hours. Our staff will call you if positive. If the test is positive, you should quarantine for 5 days from the start of your symptoms  Take benzonatate 100 mg, 1 tab every 8 hours as needed for cough.  Ondansetron dissolved in the mouth every 8 hours as needed for nausea or vomiting.

## 2022-05-26 NOTE — ED Triage Notes (Signed)
Pt presents to uc with co of intermittent dizziness, sneezing, and allergies she think she may have come in contact with a purfume last week that started the allergic reaction like symptoms. Pt also reports urinary symptoms

## 2022-05-26 NOTE — Telephone Encounter (Signed)
I spoke with patient by phone after verifying identity.  I gave her positive COVID results and sent Paxlovid to the pharmacy she requested for today.  She will follow-up with her primary care also.

## 2022-05-26 NOTE — ED Provider Notes (Signed)
Anne [name]    CSN: [id number] Arrival date & time: 05/26/22  1212      History   Chief Complaint Chief Complaint  Patient presents with   sneezing   Dizziness   Urinary Frequency    HPI Anne [name] is a 76 y.o. female.    Dizziness Urinary Frequency   Here for cough and nasal congestion that began on August 9.  She has had some headache at the top of her head.  She also has some very mild abdominal pain that is resolved.  She has had some nausea that is coming gone.  No vomiting or diarrhea.  She notes some urinary frequency, but no dysuria or urinary pain.  She is felt dizzy or lightheaded sometimes.  No shortness of breath.  She did not have any home COVID test to do.  Past Medical History:  Diagnosis Date   Hyperlipidemia    Hypertension    Osteopenia    per DEXA on 07-19-09   Stroke ([place]) 09-2007   has mild left leg weakness, saw Dr. Brett Fairy    Patient Active Problem List   Diagnosis Date Noted   GERD (gastroesophageal reflux disease) 08/29/2021   Bradycardia, unspecified 01/02/2018   Mallet deformity of fourth finger, right 02/02/2014   ALLERGIC RHINITIS 05/08/2010   DIZZINESS 02/01/2010   LIPOMA 08/11/2009   OSTEOPENIA 08/11/2009   SUPRAVENTRICULAR PREMATURE BEATS 07/28/2008   FATIGUE 06/16/2008   MYOSITIS 05/12/2008   Hyperlipidemia 11/24/2007   Essential hypertension 11/24/2007   History of cardiovascular disorder 11/24/2007    No past surgical history on file.  OB History   No obstetric history on file.      Home Medications    Prior to Admission medications   Medication Sig Start Date End Date Taking? Authorizing Provider  ondansetron (ZOFRAN-ODT) 4 MG disintegrating tablet Take 1 tablet (4 mg total) by mouth every 8 (eight) hours as needed for nausea or vomiting. 05/26/22  Yes Etty Isaac, Gwenlyn Perking, MD  acetaminophen (TYLENOL) 325 MG tablet Take 650 mg by mouth every 6 (six) hours as needed for mild pain.    [provider]  amLODipine (NORVASC) 10 MG tablet TAKE 1 TABLET BY MOUTH EVERY DAY 12/07/21   Laurey Morale, MD  aspirin 81 MG tablet Take 81 mg by mouth daily.    [provider]  benzonatate (TESSALON) 100 MG capsule Take 1 capsule (100 mg total) by mouth 3 (three) times daily as needed for cough. 05/26/22   Barrett Henle, MD  Calcium-Vitamin D (CALTRATE 600 PLUS-VIT D PO) Take 1 tablet by mouth daily.    [provider]  COVID-19 mRNA bivalent vaccine, Moderna, (MODERNA COVID-19 BIVAL BOOSTER) 50 MCG/0.5ML injection Inject into the muscle. 08/14/21   Carlyle Basques, MD  COVID-19 mRNA vaccine, Moderna, (MODERNA COVID-19 VACCINE) 100 MCG/0.5ML injection Inject into the muscle. 05/04/21   Carlyle Basques, MD  hydrochlorothiazide (HYDRODIURIL) 25 MG tablet TAKE 1 TABLET BY MOUTH EVERY DAY 01/28/22   Laurey Morale, MD  KLOR-CON M20 20 MEQ tablet TAKE 1 TABLET BY MOUTH EVERY DAY 12/13/21   Laurey Morale, MD  loratadine (CLARITIN) 10 MG tablet Take 1 tablet (10 mg total) by mouth daily. 01/31/21   Laurey Morale, MD  losartan (COZAAR) 50 MG tablet TAKE 1 TABLET BY MOUTH EVERY DAY 03/25/22   Laurey Morale, MD  meclizine (ANTIVERT) 25 MG tablet Take 1 tablet (25 mg total) by mouth every 4 (four) hours  as needed for dizziness. 07/03/21   Laurey Morale, MD  Multiple Vitamins-Minerals (CENTRUM SILVER PO) Take 1 tablet by mouth daily.    [provider]  omeprazole (PRILOSEC) 40 MG capsule Take 1 capsule (40 mg total) by mouth daily. 11/06/20   Laurey Morale, MD  rosuvastatin (CRESTOR) 5 MG tablet Take 1 tablet (5 mg total) by mouth daily. 01/04/22   Belva Crome, MD  vitamin C (ASCORBIC ACID) 500 MG tablet Take 500 mg by mouth daily.    [provider]    Family History Family History  Family history unknown: Yes    Social History Social History   Tobacco Use   Smoking status: Never   Smokeless tobacco: Never  Vaping Use   Vaping Use: Never used  Substance  Use Topics   Alcohol use: Not Currently    Comment: rare   Drug use: No     Allergies   Iodine and Latex   Review of Systems Review of Systems  Genitourinary:  Positive for frequency.  Neurological:  Positive for dizziness.     Physical Exam Triage Vital Signs ED Triage Vitals [05/26/22 1239]  Enc Vitals Group     BP 115/72     Pulse Rate 96     Resp 18     Temp 97.9 F (36.6 C)     Temp src      SpO2 98 %     Weight      Height      Head Circumference      Peak Flow      Pain Score 0     Pain Loc      Pain Edu?      Excl. in [place]?    No data found.  Updated Vital Signs BP 115/72   Pulse 96   Temp 97.9 F (36.6 C)   Resp 18   SpO2 98%   Visual Acuity Right Eye Distance:   Left Eye Distance:   Bilateral Distance:    Right Eye Near:   Left Eye Near:    Bilateral Near:     Physical Exam Vitals reviewed.  Constitutional:      General: She is not in acute distress.    Appearance: She is not toxic-appearing.  HENT:     Right Ear: Tympanic membrane and ear canal normal.     Left Ear: Tympanic membrane and ear canal normal.     Nose: Nose normal.     Mouth/Throat:     Mouth: Mucous membranes are moist.     Pharynx: No oropharyngeal exudate or posterior oropharyngeal erythema.  Eyes:     Extraocular Movements: Extraocular movements intact.     Conjunctiva/sclera: Conjunctivae normal.     Pupils: Pupils are equal, round, and reactive to light.  Cardiovascular:     Rate and Rhythm: Normal rate and regular rhythm.     Heart sounds: No murmur heard. Pulmonary:     Effort: Pulmonary effort is normal. No respiratory distress.     Breath sounds: No wheezing, rhonchi or rales.  Chest:     Chest wall: No tenderness.  Musculoskeletal:     Cervical back: Neck supple.  Lymphadenopathy:     Cervical: No cervical adenopathy.  Skin:    Capillary Refill: Capillary refill takes less than 2 seconds.     Coloration: Skin is not jaundiced or pale.   Neurological:     General: No focal deficit present.  Mental Status: She is alert and oriented to person, place, and time.  Psychiatric:        Behavior: Behavior normal.      UC Treatments / Results  Labs (all labs ordered are listed, but only abnormal results are displayed) Labs Reviewed  POCT URINALYSIS DIPSTICK, ED / UC - Abnormal; Notable for the following components:      Result Value   Hgb urine dipstick TRACE (*)    All other components within normal limits  SARS CORONAVIRUS 2 BY RT PCR    EKG   Radiology No results found.  Procedures Procedures (including critical care time)  Medications Ordered in UC Medications - No data to display  Initial Impression / Assessment and Plan / UC Course  I have reviewed the triage vital signs and the nursing notes.  Pertinent labs & imaging results that were available during my care of the patient were reviewed by me and considered in my medical decision making (see chart for details).    UA shows only a trace of blood.  We are going to do the "2-hour" COVID swab.  She is at day 4 of symptoms today, so I am hoping to get her results in a timely fashion so that we can start her on oral antivirals if it is in fact positive for COVID if she is positive, she should have a prescription for Paxlovid.  Her most recent creatinine was 0.74, and her GFR was 79  Final Clinical Impressions(s) / UC Diagnoses   Final diagnoses:  Viral URI with cough     Discharge Instructions      Your urine test did not show any significant abnormalities   You have been swabbed for COVID, and the test will result in the next few hours. Our staff will call you if positive. If the test is positive, you should quarantine for 5 days from the start of your symptoms  Take benzonatate 100 mg, 1 tab every 8 hours as needed for cough.  Ondansetron dissolved in the mouth every 8 hours as needed for nausea or vomiting.        ED Prescriptions      Medication Sig Dispense Auth. Provider   benzonatate (TESSALON) 100 MG capsule Take 1 capsule (100 mg total) by mouth 3 (three) times daily as needed for cough. 21 capsule Barrett Henle, MD   ondansetron (ZOFRAN-ODT) 4 MG disintegrating tablet Take 1 tablet (4 mg total) by mouth every 8 (eight) hours as needed for nausea or vomiting. 10 tablet Windy Carina Gwenlyn Perking, MD      PDMP not reviewed this encounter.   Barrett Henle, MD 05/26/22 1325

## 2022-05-27 ENCOUNTER — Telehealth (INDEPENDENT_AMBULATORY_CARE_PROVIDER_SITE_OTHER): Payer: Medicare Other | Admitting: Family Medicine

## 2022-05-27 ENCOUNTER — Encounter: Payer: Self-pay | Admitting: Family Medicine

## 2022-05-27 VITALS — Wt 160.0 lb

## 2022-05-27 DIAGNOSIS — U071 COVID-19: Secondary | ICD-10-CM | POA: Diagnosis not present

## 2022-05-27 NOTE — Progress Notes (Signed)
   Subjective:    Patient ID: Anne [name], female    DOB: Mar 04, 1946, 76 y.o.   MRN: [id number]  HPI Virtual Visit via Telephone Note  I connected with the patient on 05/27/22 at  2:15 PM EDT by telephone and verified that I am speaking with the correct person using two identifiers.   I discussed the limitations, risks, security and privacy concerns of performing an evaluation and management service by telephone and the availability of in person appointments. I also discussed with the patient that there may be a patient responsible charge related to this service. The patient expressed understanding and agreed to proceed.  Location patient: home Location provider: work or home office Participants present for the call: patient, provider Patient did not have a visit in the prior 7 days to address this/these issue(s).   History of Present Illness: Here to follow up an urgent care visit yesterday. She presented with one week of dry cough, nausea, fever, and aches. She tested positive for the Covid-19 virus. She was given Benzonatate and Zofran to take as needed, and she was given 5 days of Paxlovid. Today she feels a little better and the fever is gone. No chest pain or SOB.    Observations/Objective: Patient sounds cheerful and well on the phone. I do not appreciate any SOB. Speech and thought processing are grossly intact. Patient reported vitals:  Assessment and Plan: Covid infection. She will finish up the Paxlovid. Recheck as needed.  Alysia Penna, MD   Follow Up Instructions:     [phone] 5-10 [phone] 11-20 [phone] 21-30 I did not refer this patient for an OV in the next 24 hours for this/these issue(s).  I discussed the assessment and treatment plan with the patient. The patient was provided an opportunity to ask questions and all were answered. The patient agreed with the plan and demonstrated an understanding of the instructions.   The patient was advised to call back or  seek an in-person evaluation if the symptoms worsen or if the condition fails to improve as anticipated.  I provided 17 minutes of non-face-to-face time during this encounter.   Alysia Penna, MD     Review of Systems     Objective:   Physical Exam        Assessment & Plan:

## 2022-05-29 ENCOUNTER — Telehealth: Payer: Self-pay | Admitting: Family Medicine

## 2022-05-29 NOTE — Telephone Encounter (Signed)
Pt called requesting to speak with CMA. CMA was unavailable. Pt was asked if it had to do with Rx refills, as I could help her with that. Pt said no, and only wanted to speak to CMA. Pt asked that CMA call back at their earliest convenience.

## 2022-05-29 NOTE — Telephone Encounter (Signed)
Spoke with pt verbalized understanding of the directions for taking  COVID Antiviral medication

## 2022-05-30 NOTE — Telephone Encounter (Signed)
Pt is calling and would like a callback concerning antiviral medication she has questions

## 2022-05-30 NOTE — Telephone Encounter (Signed)
Pt called the office this morning again requesting for a directions on how she should take the Paxlovid prescription for COVID-19 diagnoses, pt stated that she was confused of the directions given, Advised and explained to pt what the directions are but pt had difficulties understanding. Due to pt leaving far from the office per Dr Sarajane Jews pt was advised to go to her pharmacy and have the pharmacist explain how she should be taking the medication. Pt verbalized understanding

## 2022-06-10 IMAGING — MG MM DIGITAL SCREENING BILAT W/ TOMO AND CAD
6 of 10 series · 6 of 30 positions shown · non-contrast
Comparison: None.

CLINICAL DATA: Screening.

EXAM:
DIGITAL SCREENING BILATERAL MAMMOGRAM WITH TOMOSYNTHESIS AND CAD
TECHNIQUE: Bilateral screening digital craniocaudal and mediolateral oblique
mammograms were obtained. Bilateral screening digital breast
tomosynthesis was performed. The images were evaluated with
computer-aided detection.

[L MLO synth-2D]
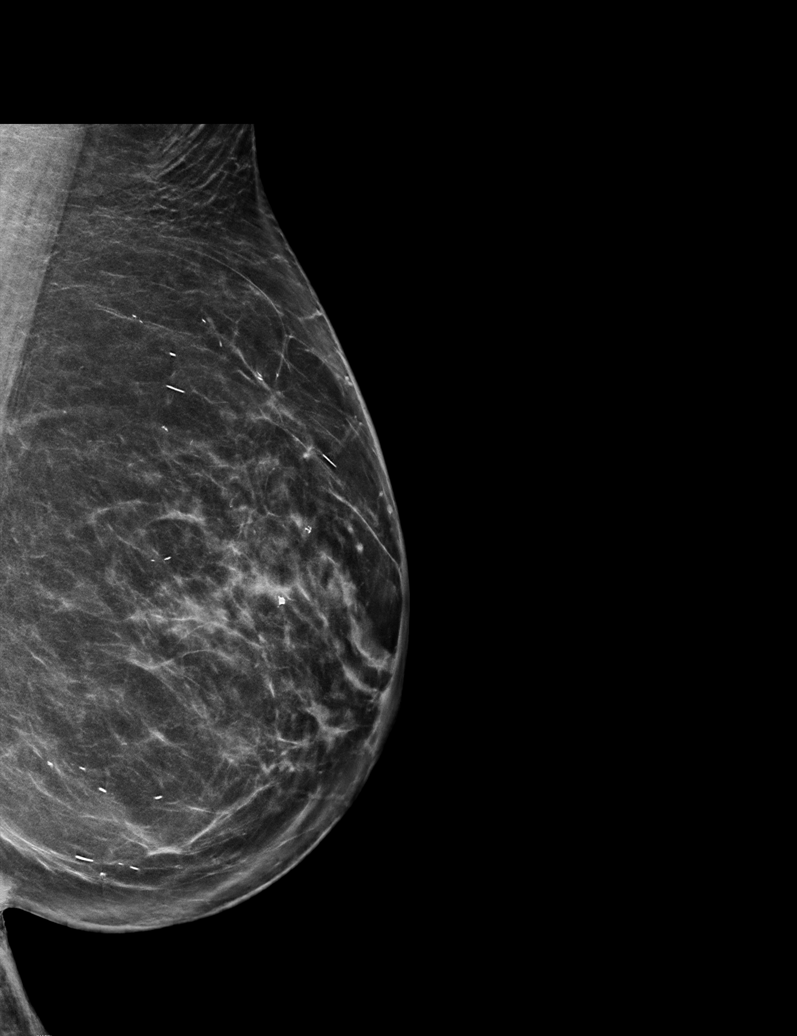

[L CC synth-2D]
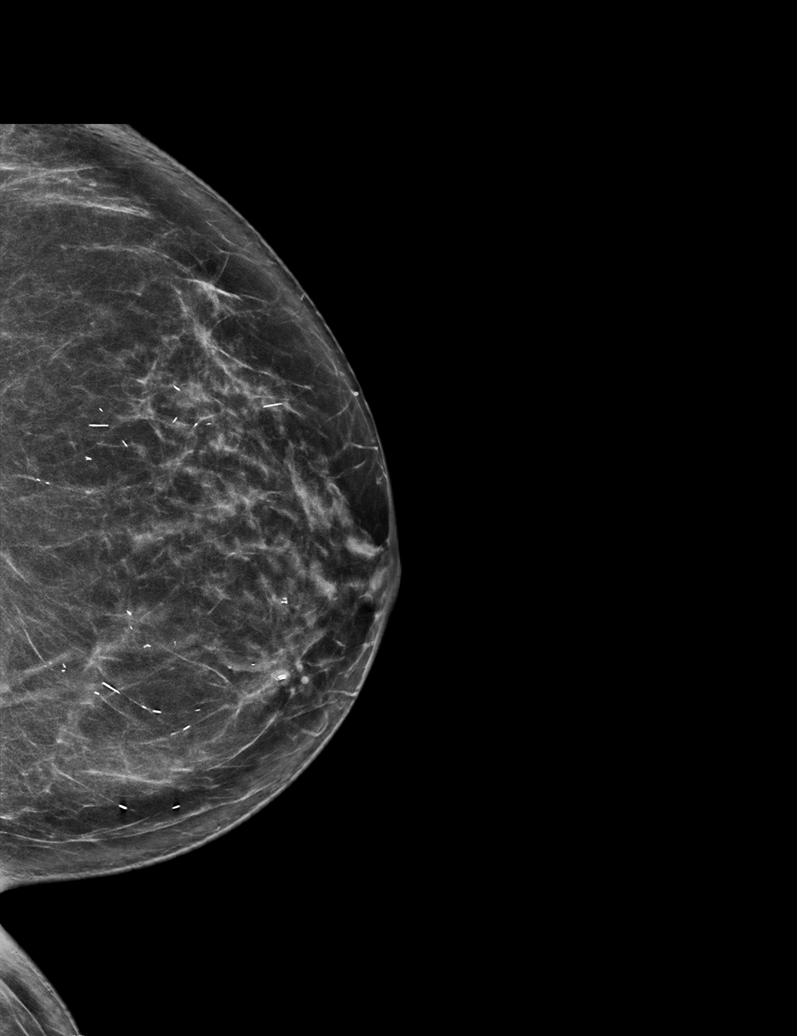

[R CC synth-2D]
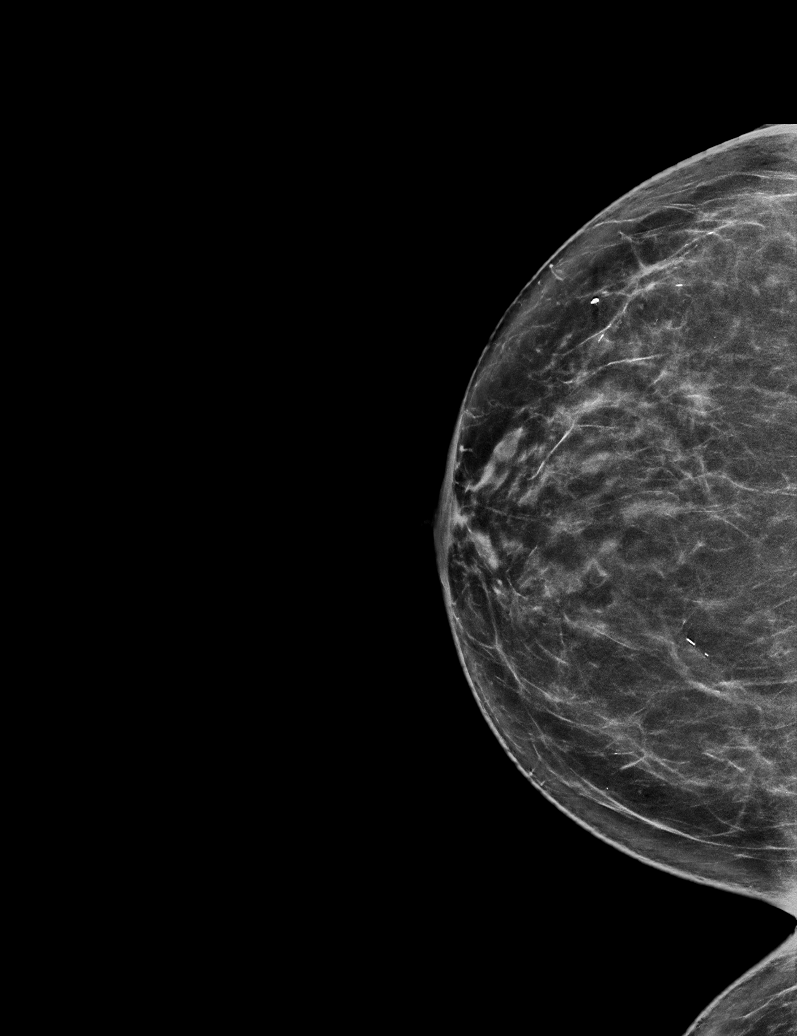

[R MLO synth-2D (1 of 2)]
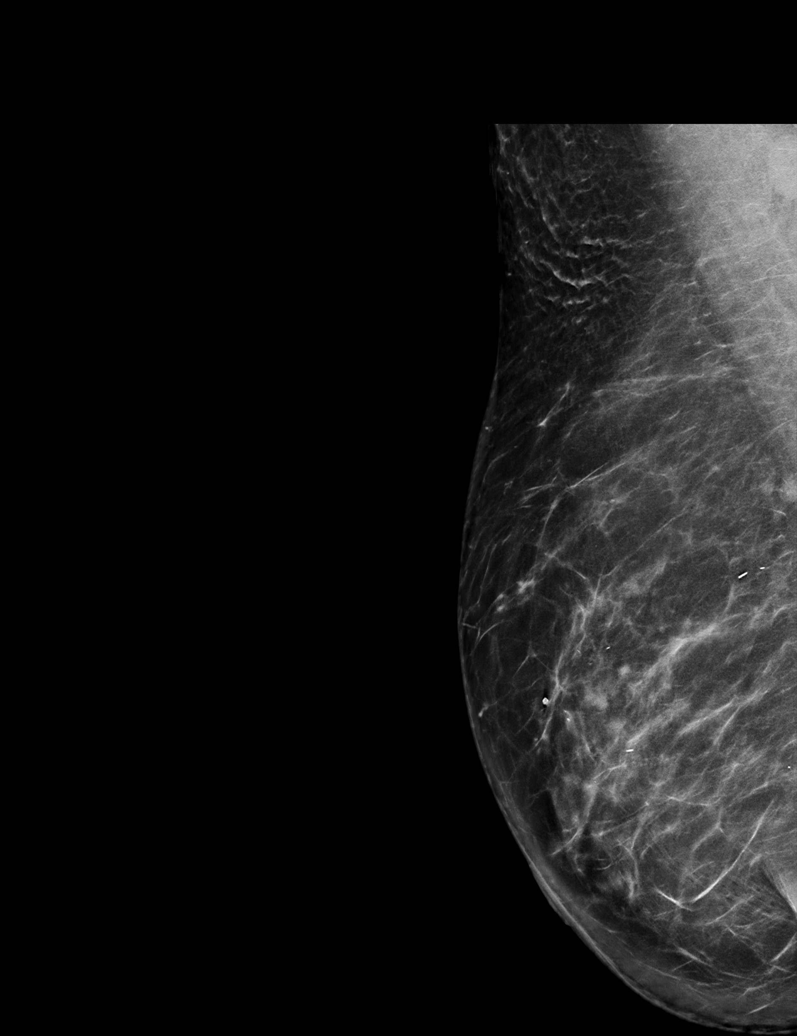

[R MLO synth-2D (2 of 2)]
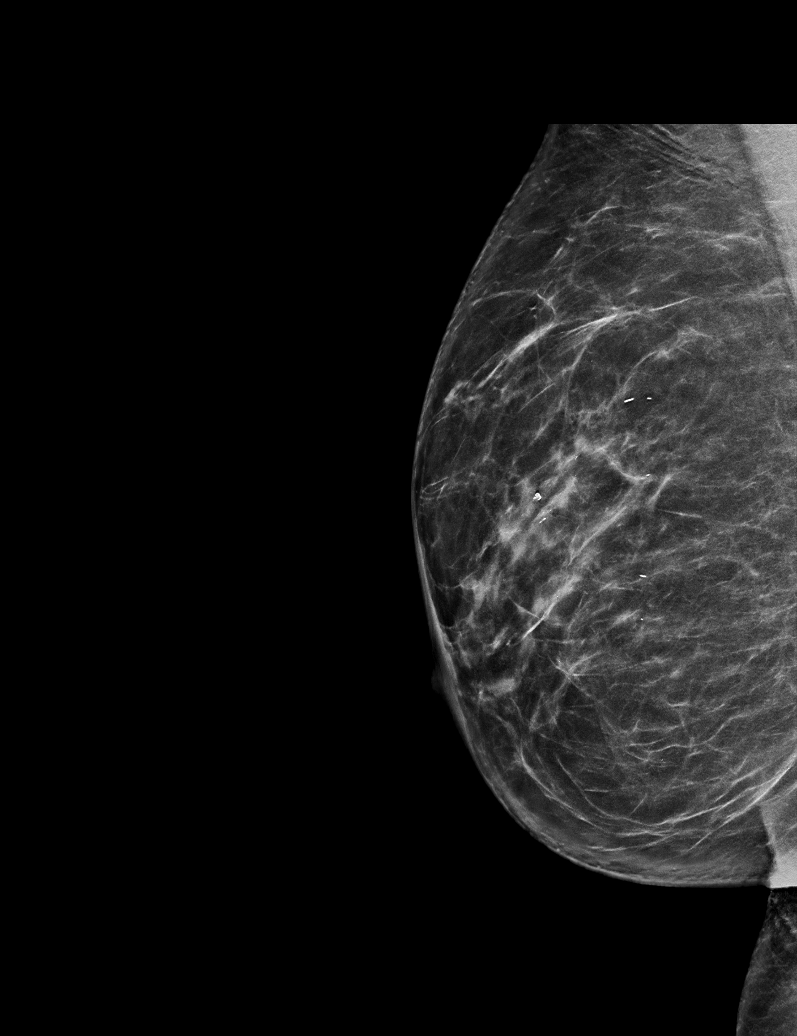

[R MLO tomo · tomo slice 39/77.0]
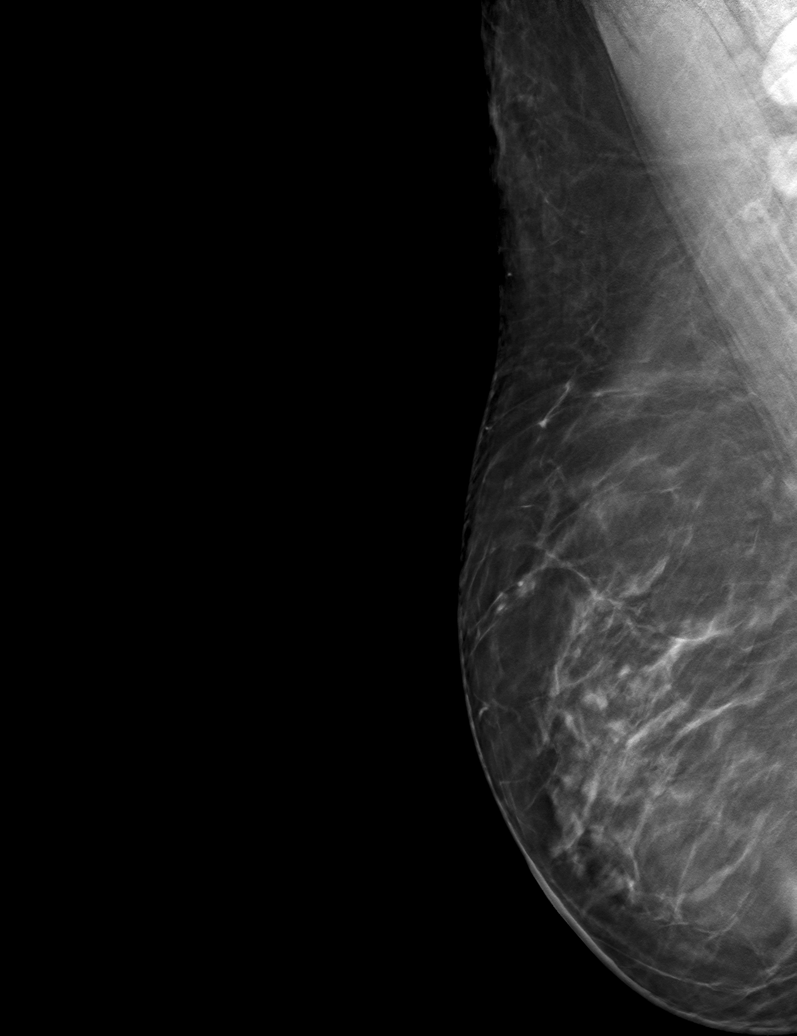

[6 of 30 positions shown; findings below may reference images not displayed]

ACR Breast Density Category b: There are scattered areas of
fibroglandular density.
FINDINGS: In the right breast possible distortion outer breast at middle to
posterior depth requires further evaluation (CC frame 41/70).

In the left breast a possible asymmetry in the upper breast at
middle depth with possible correlate in the slightly outer breast on
CC view requires further evaluation (MLO frame 29/71; CC frame
22/67).
IMPRESSION: Further evaluation is suggested for possible distortion in the right
breast.

Further evaluation is suggested for possible asymmetry in the left
breast.

RECOMMENDATION:
Diagnostic mammogram and possibly ultrasound of both breasts.
(Code:90-N-TTL)

The patient will be contacted regarding the findings, and additional
imaging will be scheduled.

BI-RADS CATEGORY  0: Incomplete. Need additional imaging evaluation
and/or prior mammograms for comparison.

## 2022-06-20 ENCOUNTER — Telehealth: Payer: Self-pay | Admitting: Family Medicine

## 2022-06-20 ENCOUNTER — Telehealth: Payer: Self-pay | Admitting: Interventional Cardiology

## 2022-06-20 NOTE — Telephone Encounter (Signed)
Pt c/o medication issue:  1. Name of Medication: Rosuvastatin  2. How are you currently taking this medication (dosage and times per day)? 1 time a day  3. Are you having a reaction (difficulty breathing--STAT)?   4. What is your medication issue? Vision is blurred, and pains in her middle fingers

## 2022-06-20 NOTE — Telephone Encounter (Signed)
error 

## 2022-06-24 ENCOUNTER — Encounter (HOSPITAL_COMMUNITY): Payer: Self-pay | Admitting: *Deleted

## 2022-06-24 ENCOUNTER — Ambulatory Visit (HOSPITAL_COMMUNITY)
Admission: EM | Admit: 2022-06-24 | Discharge: 2022-06-24 | Disposition: A | Discharge status: Home / Self Care | Payer: Medicare Other | Attending: Family Medicine | Admitting: Family Medicine

## 2022-06-24 ENCOUNTER — Other Ambulatory Visit: Payer: Self-pay

## 2022-06-24 DIAGNOSIS — K529 Noninfective gastroenteritis and colitis, unspecified: Secondary | ICD-10-CM

## 2022-06-24 DIAGNOSIS — R42 Dizziness and giddiness: Secondary | ICD-10-CM

## 2022-06-24 LAB — POCT URINALYSIS DIPSTICK, ED / UC
Bilirubin Urine: NEGATIVE
Glucose, UA: NEGATIVE mg/dL
Hgb urine dipstick: NEGATIVE
Ketones, ur: NEGATIVE mg/dL
Leukocytes,Ua: NEGATIVE
Nitrite: NEGATIVE
Protein, ur: NEGATIVE mg/dL
Specific Gravity, Urine: 1.015 (ref 1.005–1.030)
Urobilinogen, UA: 0.2 mg/dL (ref 0.0–1.0)
pH: 7.5 (ref 5.0–8.0)

## 2022-06-24 LAB — CBG MONITORING, ED: Glucose-Capillary: 110 mg/dL — ABNORMAL HIGH (ref 70–99)

## 2022-06-24 MED ORDER — ONDANSETRON 4 MG PO TBDP
4.0000 mg | ORAL_TABLET | Freq: Once | ORAL | Status: AC
  Administered 2022-06-24: 4 mg via ORAL

## 2022-06-24 MED ORDER — ONDANSETRON 4 MG PO TBDP
ORAL_TABLET | ORAL | Status: AC
  Filled 2022-06-24: qty 1

## 2022-06-24 NOTE — ED Provider Notes (Signed)
[place]    CSN: [id number] Arrival date & time: 06/24/22  7902     History   Chief Complaint Chief Complaint  Patient presents with   Emesis    HPI Anne [name] is a 76 y.o. female.  Presents with 1 day history of nausea, vomiting, diarrhea. Reports she had some light abdominal pain and cramping that began yesterday.   Pain was non-severe, and no pain today. Reports 2 episodes of nonbloody, nonbilious vomiting. A couple episodes of loose stools.  She tried drinking water early this morning but it came back up. Some dizziness associated, mostly when turning her head.  Denies fever. No urinary symptoms. No chest pain or shortness of breath. History of myomectomy, ovary removed (possibly R).  COVID positive last month No known sick contacts  Past Medical History:  Diagnosis Date   Hyperlipidemia    Hypertension    Osteopenia    per DEXA on 07-19-09   Stroke ([place]) 09-2007   has mild left leg weakness, saw Dr. Brett Fairy    Patient Active Problem List   Diagnosis Date Noted   COVID-19 virus infection 05/27/2022   GERD (gastroesophageal reflux disease) 08/29/2021   Bradycardia, unspecified 01/02/2018   Mallet deformity of fourth finger, right 02/02/2014   ALLERGIC RHINITIS 05/08/2010   DIZZINESS 02/01/2010   LIPOMA 08/11/2009   OSTEOPENIA 08/11/2009   SUPRAVENTRICULAR PREMATURE BEATS 07/28/2008   FATIGUE 06/16/2008   MYOSITIS 05/12/2008   Hyperlipidemia 11/24/2007   Essential hypertension 11/24/2007   History of cardiovascular disorder 11/24/2007    History reviewed. No pertinent surgical history.  OB History   No obstetric history on file.      Home Medications    Prior to Admission medications   Medication Sig Start Date End Date Taking? Authorizing Provider  acetaminophen (TYLENOL) 325 MG tablet Take 650 mg by mouth every 6 (six) hours as needed for mild pain.    [provider]  amLODipine (NORVASC) 10 MG tablet TAKE 1  TABLET BY MOUTH EVERY DAY 12/07/21   Laurey Morale, MD  aspirin 81 MG tablet Take 81 mg by mouth daily.    [provider]  benzonatate (TESSALON) 100 MG capsule Take 1 capsule (100 mg total) by mouth 3 (three) times daily as needed for cough. 05/26/22   Barrett Henle, MD  Calcium-Vitamin D (CALTRATE 600 PLUS-VIT D PO) Take 1 tablet by mouth daily.    [provider]  COVID-19 mRNA bivalent vaccine, Moderna, (MODERNA COVID-19 BIVAL BOOSTER) 50 MCG/0.5ML injection Inject into the muscle. 08/14/21   Carlyle Basques, MD  COVID-19 mRNA vaccine, Moderna, (MODERNA COVID-19 VACCINE) 100 MCG/0.5ML injection Inject into the muscle. 05/04/21   Carlyle Basques, MD  hydrochlorothiazide (HYDRODIURIL) 25 MG tablet TAKE 1 TABLET BY MOUTH EVERY DAY 01/28/22   Laurey Morale, MD  KLOR-CON M20 20 MEQ tablet TAKE 1 TABLET BY MOUTH EVERY DAY 12/13/21   Laurey Morale, MD  loratadine (CLARITIN) 10 MG tablet Take 1 tablet (10 mg total) by mouth daily. 01/31/21   Laurey Morale, MD  losartan (COZAAR) 50 MG tablet TAKE 1 TABLET BY MOUTH EVERY DAY 03/25/22   Laurey Morale, MD  meclizine (ANTIVERT) 25 MG tablet Take 1 tablet (25 mg total) by mouth every 4 (four) hours as needed for dizziness. 07/03/21   Laurey Morale, MD  Multiple Vitamins-Minerals (CENTRUM SILVER PO) Take 1 tablet by mouth daily.    [provider]  nirmatrelvir/ritonavir EUA (PAXLOVID) 20  x 150 MG & 10 x '100MG'$  TABS Patient GFR is 79. Take nirmatrelvir (150 mg) two tablets twice daily for 5 days and ritonavir (100 mg) one tablet twice daily for 5 days. 05/26/22   Barrett Henle, MD  omeprazole (PRILOSEC) 40 MG capsule Take 1 capsule (40 mg total) by mouth daily. 11/06/20   Laurey Morale, MD  ondansetron (ZOFRAN-ODT) 4 MG disintegrating tablet Take 1 tablet (4 mg total) by mouth every 8 (eight) hours as needed for nausea or vomiting. 05/26/22   Barrett Henle, MD  rosuvastatin (CRESTOR) 5 MG tablet Take 1 tablet (5 mg total) by  mouth daily. 01/04/22   Belva Crome, MD  vitamin C (ASCORBIC ACID) 500 MG tablet Take 500 mg by mouth daily.    [provider]    Family History Family History  Family history unknown: Yes    Social History Social History   Tobacco Use   Smoking status: Never   Smokeless tobacco: Never  Vaping Use   Vaping Use: Never used  Substance Use Topics   Alcohol use: Not Currently    Comment: rare   Drug use: No     Allergies   Iodine and Latex   Review of Systems Review of Systems  Gastrointestinal:  Positive for vomiting.   Per HPI  Physical Exam Triage Vital Signs ED Triage Vitals  Enc Vitals Group     BP 06/24/22 1052 (!) 153/79     Pulse Rate 06/24/22 1052 89     Resp 06/24/22 1052 18     Temp 06/24/22 1052 98.1 F (36.7 C)     Temp src --      SpO2 06/24/22 1052 98 %     Weight --      Height --      Head Circumference --      Peak Flow --      Pain Score 06/24/22 1051 0     Pain Loc --      Pain Edu? --      Excl. in [place]? --    No data found.  Updated Vital Signs BP (!) 151/67   Pulse 60   Temp 97.8 F (36.6 C)   Resp 20   SpO2 100%   Physical Exam Vitals and nursing note reviewed.  Constitutional:      General: She is not in acute distress.    Appearance: Normal appearance.  HENT:     Mouth/Throat:     Mouth: Mucous membranes are moist.     Pharynx: Oropharynx is clear.  Eyes:     Conjunctiva/sclera: Conjunctivae normal.  Cardiovascular:     Rate and Rhythm: Normal rate and regular rhythm.     Heart sounds: Normal heart sounds.  Pulmonary:     Effort: Pulmonary effort is normal. No respiratory distress.     Breath sounds: Normal breath sounds.  Abdominal:     General: Bowel sounds are normal.     Palpations: Abdomen is soft.     Tenderness: There is no abdominal tenderness. There is no right CVA tenderness, left CVA tenderness, guarding or rebound.  Musculoskeletal:        General: Normal range of motion.  Skin:     General: Skin is warm and dry.  Neurological:     General: No focal deficit present.     Mental Status: She is alert and oriented to person, place, and time.     Cranial Nerves: No cranial  nerve deficit or facial asymmetry.     Sensory: Sensation is intact.     Motor: Motor function is intact. No weakness or pronator drift.     Coordination: Coordination is intact.     Gait: Gait is intact.     UC Treatments / Results  Labs (all labs ordered are listed, but only abnormal results are displayed) Labs Reviewed  CBG MONITORING, ED - Abnormal; Notable for the following components:      Result Value   Glucose-Capillary 110 (*)    All other components within normal limits  POCT URINALYSIS DIPSTICK, ED / UC    EKG  Radiology No results found.  Procedures Procedures (including critical care time)  Medications Ordered in UC Medications  ondansetron (ZOFRAN-ODT) disintegrating tablet 4 mg (4 mg Oral Given 06/24/22 1150)    Initial Impression / Assessment and Plan / UC Course  I have reviewed the triage vital signs and the nursing notes.  Pertinent labs & imaging results that were available during my care of the patient were reviewed by me and considered in my medical decision making (see chart for details).  Afebrile, well appearing, abdominal exam benign  Neuro exam intact  Urinalysis unremarkable. CBG 110  Zofran given with po challenge. Tolerated.  Likely gastroenteritis, could be self-limited as patient has not had symptoms today. Recommend close follow up with primary care provider. Discussed increasing fluids. Strict ED precautions. Patient agrees to plan  Final Clinical Impressions(s) / UC Diagnoses   Final diagnoses:  Gastroenteritis  Dizziness     Discharge Instructions      You may have a stomach virus.  Drink lots of fluids to replenish the fluids you have lost.  I recommend follow up with your primary care provider.  Please return to the urgent  care if symptoms do not improve, or go to the emergency department if symptoms worsen.      ED Prescriptions   None    PDMP not reviewed this encounter.   Adelei Scobey, Vernice Jefferson 06/24/22 1220

## 2022-06-24 NOTE — ED Triage Notes (Signed)
Pt reports vomiting multiple times since yesterday. Pt attempted to check BP but her cuff was not working. Pt felt like her BP was high.

## 2022-06-24 NOTE — Discharge Instructions (Addendum)
You may have a stomach virus.  Drink lots of fluids to replenish the fluids you have lost.  I recommend follow up with your primary care provider.  Please return to the urgent care if symptoms do not improve, or go to the emergency department if symptoms worsen.

## 2022-06-25 ENCOUNTER — Emergency Department (HOSPITAL_COMMUNITY): Payer: Medicare Other

## 2022-06-25 ENCOUNTER — Emergency Department (HOSPITAL_COMMUNITY)
Admission: EM | Admit: 2022-06-25 | Discharge: 2022-06-25 | Disposition: A | Discharge status: Home / Self Care | Payer: Medicare Other | Attending: Emergency Medicine | Admitting: Emergency Medicine

## 2022-06-25 ENCOUNTER — Other Ambulatory Visit: Payer: Self-pay

## 2022-06-25 DIAGNOSIS — Z91040 Latex allergy status: Secondary | ICD-10-CM | POA: Insufficient documentation

## 2022-06-25 DIAGNOSIS — Z20822 Contact with and (suspected) exposure to covid-19: Secondary | ICD-10-CM | POA: Insufficient documentation

## 2022-06-25 DIAGNOSIS — R42 Dizziness and giddiness: Secondary | ICD-10-CM | POA: Insufficient documentation

## 2022-06-25 DIAGNOSIS — Z7982 Long term (current) use of aspirin: Secondary | ICD-10-CM | POA: Diagnosis not present

## 2022-06-25 DIAGNOSIS — R079 Chest pain, unspecified: Secondary | ICD-10-CM | POA: Diagnosis not present

## 2022-06-25 DIAGNOSIS — I6381 Other cerebral infarction due to occlusion or stenosis of small artery: Secondary | ICD-10-CM | POA: Diagnosis not present

## 2022-06-25 LAB — CBC WITH DIFFERENTIAL/PLATELET
Abs Immature Granulocytes: 0.03 10*3/uL (ref 0.00–0.07)
Basophils Absolute: 0 10*3/uL (ref 0.0–0.1)
Basophils Relative: 0 %
Eosinophils Absolute: 0.2 10*3/uL (ref 0.0–0.5)
Eosinophils Relative: 2 %
HCT: 37.4 % (ref 36.0–46.0)
Hemoglobin: 12.8 g/dL (ref 12.0–15.0)
Immature Granulocytes: 0 %
Lymphocytes Relative: 18 %
Lymphs Abs: 1.8 10*3/uL (ref 0.7–4.0)
MCH: 30.8 pg (ref 26.0–34.0)
MCHC: 34.2 g/dL (ref 30.0–36.0)
MCV: 90.1 fL (ref 80.0–100.0)
Monocytes Absolute: 0.7 10*3/uL (ref 0.1–1.0)
Monocytes Relative: 7 %
Neutro Abs: 7.4 10*3/uL (ref 1.7–7.7)
Neutrophils Relative %: 73 %
Platelets: 426 10*3/uL — ABNORMAL HIGH (ref 150–400)
RBC: 4.15 MIL/uL (ref 3.87–5.11)
RDW: 12.9 % (ref 11.5–15.5)
WBC: 10.2 10*3/uL (ref 4.0–10.5)
nRBC: 0 % (ref 0.0–0.2)

## 2022-06-25 LAB — URINALYSIS, ROUTINE W REFLEX MICROSCOPIC
Bilirubin Urine: NEGATIVE
Glucose, UA: NEGATIVE mg/dL
Hgb urine dipstick: NEGATIVE
Ketones, ur: NEGATIVE mg/dL
Leukocytes,Ua: NEGATIVE
Nitrite: NEGATIVE
Protein, ur: NEGATIVE mg/dL
Specific Gravity, Urine: 1.01 (ref 1.005–1.030)
pH: 7 (ref 5.0–8.0)

## 2022-06-25 LAB — RESP PANEL BY RT-PCR (FLU A&B, COVID) ARPGX2
Influenza A by PCR: NEGATIVE
Influenza B by PCR: NEGATIVE
SARS Coronavirus 2 by RT PCR: NEGATIVE

## 2022-06-25 LAB — COMPREHENSIVE METABOLIC PANEL
ALT: 17 U/L (ref 0–44)
AST: 22 U/L (ref 15–41)
Albumin: 3.7 g/dL (ref 3.5–5.0)
Alkaline Phosphatase: 79 U/L (ref 38–126)
Anion gap: 8 (ref 5–15)
BUN: 10 mg/dL (ref 8–23)
CO2: 28 mmol/L (ref 22–32)
Calcium: 9.3 mg/dL (ref 8.9–10.3)
Chloride: 100 mmol/L (ref 98–111)
Creatinine, Ser: 0.89 mg/dL (ref 0.44–1.00)
GFR, Estimated: >60 mL/min (ref >60)
Glucose, Bld: 111 mg/dL — ABNORMAL HIGH (ref 70–99)
Potassium: 3.3 mmol/L — ABNORMAL LOW (ref 3.5–5.1)
Sodium: 136 mmol/L (ref 135–145)
Total Bilirubin: 0.8 mg/dL (ref 0.3–1.2)
Total Protein: 7.2 g/dL (ref 6.5–8.1)

## 2022-06-25 LAB — TROPONIN I (HIGH SENSITIVITY)
Troponin I (High Sensitivity): 10 ng/L (ref <18)
Troponin I (High Sensitivity): 10 ng/L (ref <18)

## 2022-06-25 MED ORDER — MECLIZINE HCL 25 MG PO TABS
12.5000 mg | ORAL_TABLET | Freq: Once | ORAL | Status: AC
  Administered 2022-06-25: 12.5 mg via ORAL
  Filled 2022-06-25: qty 1

## 2022-06-25 MED ORDER — POTASSIUM CHLORIDE CRYS ER 20 MEQ PO TBCR
40.0000 meq | EXTENDED_RELEASE_TABLET | Freq: Once | ORAL | Status: AC
  Administered 2022-06-25: 40 meq via ORAL
  Filled 2022-06-25: qty 2

## 2022-06-25 MED ORDER — ONDANSETRON HCL 4 MG PO TABS: 4.0000 mg | ORAL_TABLET | Freq: Four times a day (QID) | ORAL | 0 refills | Status: DC

## 2022-06-25 MED ORDER — MECLIZINE HCL 12.5 MG PO TABS: 12.5000 mg | ORAL_TABLET | Freq: Three times a day (TID) | ORAL | 0 refills | Status: DC | PRN

## 2022-06-25 MED ORDER — SODIUM CHLORIDE 0.9 % IV BOLUS
500.0000 mL | Freq: Once | INTRAVENOUS | Status: AC
  Administered 2022-06-25: 500 mL via INTRAVENOUS

## 2022-06-25 MED ORDER — ONDANSETRON 4 MG PO TBDP
4.0000 mg | ORAL_TABLET | Freq: Once | ORAL | Status: AC
  Administered 2022-06-25: 4 mg via ORAL
  Filled 2022-06-25: qty 1

## 2022-06-25 NOTE — ED Notes (Signed)
Patient transported to CT 

## 2022-06-25 NOTE — ED Notes (Addendum)
Patient transported to MRI 

## 2022-06-25 NOTE — ED Provider Triage Note (Cosign Needed Addendum)
Emergency Medicine Provider Triage Evaluation Note  Anne [name] , a 76 y.o. female  was evaluated in triage.  Pt complains of dizziness with turning her head. Reports some vomiting and loose stools x2. Denies any belly pain. Went to urgent care and diagnosed with gastroenteritis. Came here because she wasn't better.Denies any abdominal pain. Also reports some chest discomfort since yesterday.   Review of Systems  Positive:  Negative:   Physical Exam  BP 110/71 (BP Location: Right Arm)   Pulse (!) 58   Temp 98 F (36.7 C) (Oral)   Resp 18   Ht '5\' 2"'$  (1.575 m)   Wt 68 kg   SpO2 97%   BMI 27.44 kg/m  Gen:   Awake, no distress   Resp:  Normal effort  MSK:   Moves extremities without difficulty  Other:  Pupillary difference. Right pupil smaller than left, but still reactive to light. Otherwise, Cranial nerves grossly intact.   Medical Decision Making  Medically screening exam initiated at 10:09 AM.  Appropriate orders placed.  Anne [name] was informed that the remainder of the evaluation will be completed by another provider, this initial triage assessment does not replace that evaluation, and the importance of remaining in the ED until their evaluation is complete.     Sherrell Puller, PA-C 06/25/22 1011    Sherrell Puller, [place] 06/25/22 1013

## 2022-06-25 NOTE — ED Provider Notes (Signed)
[facility] EMERGENCY DEPARTMENT Provider Note   CSN: [id number] Arrival date & time: 06/25/22  0920     History  Chief Complaint  Patient presents with   Dizziness   light headedness    Anne [name] is a 76 y.o. female.  76 year old female with prior medical history as detailed below presents for evaluation.  She complains of intermittent vertiginous symptoms for the last several days.  She reports prior vertigo in the past.  She reports that symptoms over the last several days have been more persistent than her normal.  She denies headache or vision change.  She denies focal weakness.  She did not try anything at home.  In the past she has had good success with use of meclizine.  Vertiginous symptoms are worse with certain positions of her head.  The history is provided by the patient and medical records.       Home Medications Prior to Admission medications   Medication Sig Start Date End Date Taking? Authorizing Provider  meclizine (ANTIVERT) 12.5 MG tablet Take 1 tablet (12.5 mg total) by mouth 3 (three) times daily as needed for dizziness. 06/25/22  Yes Valarie Merino, MD  ondansetron (ZOFRAN) 4 MG tablet Take 1 tablet (4 mg total) by mouth every 6 (six) hours. 06/25/22  Yes Valarie Merino, MD  acetaminophen (TYLENOL) 325 MG tablet Take 650 mg by mouth every 6 (six) hours as needed for mild pain.    [provider]  amLODipine (NORVASC) 10 MG tablet TAKE 1 TABLET BY MOUTH EVERY DAY 12/07/21   Laurey Morale, MD  aspirin 81 MG tablet Take 81 mg by mouth daily.    [provider]  benzonatate (TESSALON) 100 MG capsule Take 1 capsule (100 mg total) by mouth 3 (three) times daily as needed for cough. 05/26/22   Barrett Henle, MD  Calcium-Vitamin D (CALTRATE 600 PLUS-VIT D PO) Take 1 tablet by mouth daily.    [provider]  COVID-19 mRNA bivalent vaccine, Moderna, (MODERNA COVID-19 BIVAL BOOSTER) 50 MCG/0.5ML injection  Inject into the muscle. 08/14/21   Carlyle Basques, MD  COVID-19 mRNA vaccine, Moderna, (MODERNA COVID-19 VACCINE) 100 MCG/0.5ML injection Inject into the muscle. 05/04/21   Carlyle Basques, MD  hydrochlorothiazide (HYDRODIURIL) 25 MG tablet TAKE 1 TABLET BY MOUTH EVERY DAY 01/28/22   Laurey Morale, MD  KLOR-CON M20 20 MEQ tablet TAKE 1 TABLET BY MOUTH EVERY DAY 12/13/21   Laurey Morale, MD  loratadine (CLARITIN) 10 MG tablet Take 1 tablet (10 mg total) by mouth daily. 01/31/21   Laurey Morale, MD  losartan (COZAAR) 50 MG tablet TAKE 1 TABLET BY MOUTH EVERY DAY 03/25/22   Laurey Morale, MD  meclizine (ANTIVERT) 25 MG tablet Take 1 tablet (25 mg total) by mouth every 4 (four) hours as needed for dizziness. 07/03/21   Laurey Morale, MD  Multiple Vitamins-Minerals (CENTRUM SILVER PO) Take 1 tablet by mouth daily.    [provider]  nirmatrelvir/ritonavir EUA (PAXLOVID) 20 x 150 MG & 10 x '100MG'$  TABS Patient GFR is 79. Take nirmatrelvir (150 mg) two tablets twice daily for 5 days and ritonavir (100 mg) one tablet twice daily for 5 days. 05/26/22   Barrett Henle, MD  omeprazole (PRILOSEC) 40 MG capsule Take 1 capsule (40 mg total) by mouth daily. 11/06/20   Laurey Morale, MD  ondansetron (ZOFRAN-ODT) 4 MG disintegrating tablet Take 1 tablet (4 mg total)  by mouth every 8 (eight) hours as needed for nausea or vomiting. 05/26/22   Barrett Henle, MD  rosuvastatin (CRESTOR) 5 MG tablet Take 1 tablet (5 mg total) by mouth daily. 01/04/22   Belva Crome, MD  vitamin C (ASCORBIC ACID) 500 MG tablet Take 500 mg by mouth daily.    [provider]      Allergies    Iodine and Latex    Review of Systems   Review of Systems  All other systems reviewed and are negative.   Physical Exam Updated Vital Signs BP 129/74   Pulse 61   Temp 98.3 F (36.8 C) (Oral)   Resp 18   Ht '5\' 2"'$  (1.575 m)   Wt 68 kg   SpO2 98%   BMI 27.44 kg/m  Physical Exam Vitals and nursing note reviewed.   Constitutional:      General: She is not in acute distress.    Appearance: Normal appearance. She is well-developed.  HENT:     Head: Normocephalic and atraumatic.  Eyes:     Conjunctiva/sclera: Conjunctivae normal.     Pupils: Pupils are equal, round, and reactive to light.  Cardiovascular:     Rate and Rhythm: Normal rate and regular rhythm.     Heart sounds: Normal heart sounds.  Pulmonary:     Effort: Pulmonary effort is normal. No respiratory distress.     Breath sounds: Normal breath sounds.  Abdominal:     General: There is no distension.     Palpations: Abdomen is soft.     Tenderness: There is no abdominal tenderness.  Musculoskeletal:        General: No deformity. Normal range of motion.     Cervical back: Normal range of motion and neck supple.  Skin:    General: Skin is warm and dry.  Neurological:     General: No focal deficit present.     Mental Status: She is alert and oriented to person, place, and time. Mental status is at baseline.     Cranial Nerves: No cranial nerve deficit.     Sensory: No sensory deficit.     Motor: No weakness.     Coordination: Coordination normal.     Gait: Gait normal.     ED Results / Procedures / Treatments   Labs (all labs ordered are listed, but only abnormal results are displayed) Labs Reviewed  CBC WITH DIFFERENTIAL/PLATELET - Abnormal; Notable for the following components:      Result Value   Platelets 426 (*)    All other components within normal limits  COMPREHENSIVE METABOLIC PANEL - Abnormal; Notable for the following components:   Potassium 3.3 (*)    Glucose, Bld 111 (*)    All other components within normal limits  URINALYSIS, ROUTINE W REFLEX MICROSCOPIC - Abnormal; Notable for the following components:   APPearance HAZY (*)    All other components within normal limits  RESP PANEL BY RT-PCR (FLU A&B, COVID) ARPGX2  TROPONIN I (HIGH SENSITIVITY)  TROPONIN I (HIGH SENSITIVITY)    EKG EKG  Interpretation  Date/Time:  Tuesday June 25 2022 09:47:39 EDT Ventricular Rate:  60 PR Interval:  180 QRS Duration: 110 QT Interval:  422 QTC Calculation: 422 R Axis:   -48 Text Interpretation: Sinus rhythm with marked sinus arrhythmia Left anterior fascicular block Left ventricular hypertrophy ( R in aVL , Cornell product , Romhilt-Estes ) Abnormal ECG When compared with ECG of 16-Jul-2021 17:54, PREVIOUS ECG IS  PRESENT Confirmed by Dene Gentry [phone]) on 06/25/2022 10:59:23 AM  Radiology MR BRAIN WO CONTRAST  Result Date: 06/25/2022 CLINICAL DATA:  Dizziness.  Rule out vascular cause EXAM: MRI HEAD WITHOUT CONTRAST TECHNIQUE: Multiplanar, multiecho pulse sequences of the brain and surrounding structures were obtained without intravenous contrast. COMPARISON:  CT head 06/25/2022 FINDINGS: Brain: Negative for acute infarct.  Negative for hemorrhage or mass Mild atrophy. Progression of confluent periventricular white matter hyperintensity. Scattered small deep white matter hyperintensities also present. Chronic infarcts in the anterior and posterior basal ganglia on the right. Vascular: Normal arterial flow voids. Skull and upper cervical spine: No focal skeletal lesion. Sinuses/Orbits: Mild mucosal edema paranasal sinuses. Negative orbit Other: None IMPRESSION: No acute abnormality Progressive periventricular white matter changes consistent with chronic microvascular ischemia compared with 2018 MRI. Electronically Signed   By: Franchot Gallo M.D.   On: 06/25/2022 14:54   CT Head Wo Contrast  Result Date: 06/25/2022 CLINICAL DATA:  Dizziness since yesterday, no known injury EXAM: CT HEAD WITHOUT CONTRAST TECHNIQUE: Contiguous axial images were obtained from the base of the skull through the vertex without intravenous contrast. RADIATION DOSE REDUCTION: This exam was performed according to the departmental dose-optimization program which includes automated exposure control, adjustment of the  mA and/or kV according to patient size and/or use of iterative reconstruction technique. COMPARISON:  11/11/2014 FINDINGS: Brain: Generalized atrophy. Normal ventricular morphology. No midline shift or mass effect. Small vessel chronic ischemic changes of deep cerebral white matter. Old lacunar infarct anterior limb RIGHT internal capsule. No intracranial hemorrhage, mass lesion, evidence of acute infarction, or extra-axial fluid collection. Vascular: No hyperdense vessels. Mild atherosclerotic calcification of internal carotid arteries at skull base Skull: Intact.  Small sclerotic focus LEFT frontal bone unchanged. Sinuses/Orbits: Clear Other: N/A IMPRESSION: Atrophy with small vessel chronic ischemic changes of deep cerebral white matter. Old lacunar infarct anterior limb RIGHT internal capsule. No acute intracranial abnormalities. Electronically Signed   By: Lavonia Dana M.D.   On: 06/25/2022 12:25   DG Chest 2 View  Result Date: 06/25/2022 CLINICAL DATA:  Chest pain, dizziness EXAM: CHEST - 2 VIEW COMPARISON:  10/14/2021 FINDINGS: There is poor inspiration. Transverse diameter of heart is slightly increased. Thoracic aorta is tortuous and ectatic. There are no signs of pulmonary edema or focal pulmonary consolidation. There is no pleural effusion or pneumothorax. IMPRESSION: There are no signs of pulmonary edema or focal pulmonary consolidation. Poor inspiration. Electronically Signed   By: Elmer Picker M.D.   On: 06/25/2022 10:45    Procedures Procedures    Medications Ordered in ED Medications  meclizine (ANTIVERT) tablet 12.5 mg (12.5 mg Oral Given 06/25/22 1141)  ondansetron (ZOFRAN-ODT) disintegrating tablet 4 mg (4 mg Oral Given 06/25/22 1141)  sodium chloride 0.9 % bolus 500 mL (0 mLs Intravenous Stopped 06/25/22 1257)  potassium chloride SA (KLOR-CON M) CR tablet 40 mEq (40 mEq Oral Given 06/25/22 1254)    ED Course/ Medical Decision Making/ A&P                           Medical  Decision Making Amount and/or Complexity of Data Reviewed Radiology: ordered.  Risk Prescription drug management.    Medical Screen Complete  This patient presented to the ED with complaint of vertigo.  This complaint involves an extensive number of treatment options. The initial differential diagnosis includes, but is not limited to, BPV, CVA, etc  This presentation is: Acute, Chronic, Self-Limited, Previously Undiagnosed,  Uncertain Prognosis, Complicated, Systemic Symptoms, and Threat to Life/Bodily Function  Patient with history of vertigo in the past presents with recurrent vertiginous symptoms.  Patient's exam is without significant acute abnormality.  Work-up is without evidence of significant pathology.  CT and MRI brain are without acute pathology.  After treatment in the ED the patient feels significantly improved.  Patient is now desiring discharge home.  She does understand need for close outpatient follow-up.  Strict return precautions given and understood.   Additional history obtained:  External records from outside sources obtained and reviewed including prior ED visits and prior Inpatient records.    Lab Tests:  I ordered and personally interpreted labs.  The pertinent results include: CBC, CMP, COVID, flu, UA, troponin   Imaging Studies ordered:  I ordered imaging studies including CT head, MRI brain, chest x-ray I independently visualized and interpreted obtained imaging which showed NAD I agree with the radiologist interpretation.   Cardiac Monitoring:  The patient was maintained on a cardiac monitor.  I personally viewed and interpreted the cardiac monitor which showed an underlying rhythm of: NSR   Medicines ordered:  I ordered medication including Zofran, potassium, meclizine for vertigo, hypokalemia Reevaluation of the patient after these medicines showed that the patient: improved  Problem List / ED  Course:  Vertigo   Reevaluation:  After the interventions noted above, I reevaluated the patient and found that they have: improved   Disposition:  After consideration of the diagnostic results and the patients response to treatment, I feel that the patent would benefit from close outpatient follow-up.          Final Clinical Impression(s) / ED Diagnoses Final diagnoses:  Vertigo    Rx / DC Orders ED Discharge Orders          Ordered    meclizine (ANTIVERT) 12.5 MG tablet  3 times daily PRN        06/25/22 1510    ondansetron (ZOFRAN) 4 MG tablet  Every 6 hours        06/25/22 1510              Valarie Merino, MD 06/25/22 1524

## 2022-06-25 NOTE — Discharge Instructions (Signed)
Return for any problem.  ?

## 2022-06-25 NOTE — ED Triage Notes (Signed)
Pt. Stated, I woke yesterday amorning and had light headedness with dizziness especially when I turned my head and have had this settled pain in my chest.

## 2022-06-25 NOTE — Telephone Encounter (Signed)
Patient was seen in ER today for vertigo

## 2022-06-26 NOTE — Telephone Encounter (Signed)
Pt was seen in ED for dizziness and was dx with vertigo Pt was given script for Meclizine and this has seemed to help Pt is aware that the dizziness is highly unlikely coming from the Crestor Will forward to Dr Tamala Julian for review .Adonis Housekeeper

## 2022-06-26 NOTE — Telephone Encounter (Signed)
Pt is calling back requesting to speak to someone in regards to this medication question again. Please advise.

## 2022-07-02 ENCOUNTER — Other Ambulatory Visit: Payer: Self-pay | Admitting: Family Medicine

## 2022-07-05 NOTE — Telephone Encounter (Signed)
Pt aware to stay on Crestor ./cy

## 2022-07-31 ENCOUNTER — Telehealth: Payer: Self-pay | Admitting: Family Medicine

## 2022-08-01 ENCOUNTER — Ambulatory Visit (INDEPENDENT_AMBULATORY_CARE_PROVIDER_SITE_OTHER): Payer: Medicare Other

## 2022-08-01 DIAGNOSIS — Z23 Encounter for immunization: Secondary | ICD-10-CM

## 2022-08-01 NOTE — Telephone Encounter (Signed)
She should call around to find where she can get another Moderna vaccine

## 2022-08-01 NOTE — Telephone Encounter (Signed)
Pt stated that a location was out of her Moderna vaccination and since they were out do she need to wait until more come in or take the pzifer vaccination instead? She heard you are not suppose to mix them if you started with one.   Please advise.

## 2022-08-01 NOTE — Telephone Encounter (Signed)
Called patient lvm with message below.

## 2022-08-02 ENCOUNTER — Ambulatory Visit (INDEPENDENT_AMBULATORY_CARE_PROVIDER_SITE_OTHER): Payer: Medicare Other | Admitting: Family Medicine

## 2022-08-02 ENCOUNTER — Encounter: Payer: Self-pay | Admitting: Family Medicine

## 2022-08-02 VITALS — BP 146/80 | HR 65 | Temp 97.5°F | Ht 62.0 in | Wt 166.0 lb

## 2022-08-02 DIAGNOSIS — Z23 Encounter for immunization: Secondary | ICD-10-CM

## 2022-08-02 DIAGNOSIS — S61451A Open bite of right hand, initial encounter: Secondary | ICD-10-CM | POA: Diagnosis not present

## 2022-08-02 MED ORDER — AMOXICILLIN-POT CLAVULANATE 875-125 MG PO TABS: 1.0000 | ORAL_TABLET | Freq: Two times a day (BID) | ORAL | 0 refills | Status: DC

## 2022-08-02 NOTE — Progress Notes (Signed)
Established Patient Office Visit  Subjective   Patient ID: Anne [name], female    DOB: 21-Dec-1945  Age: 76 y.o. MRN: [id number]  Chief Complaint  Patient presents with   Animal Bite    Patient complains of cat bite, x4 days, Patient reports redness and swelling     HPI   Anne [name] is seen following cat bite to the right hand.  This occurred on Monday.  This was actually a neighbor's cat she was assisting getting her into a cage in order for the cat to be spayed at the vet.  Patient states that the cat actually had vaccines last week and she thinks 1 of these was rabies vaccine.  The cat does spend some time outdoors.  Cat has not been acting sickly or overly aggressive in any way.  Patient's last tetanus unknown.  We cannot confirm tetanus within the past 10 years.  She has some swelling dorsum right hand.  Has not progressed through the week.  No progressive erythema.  No fevers or chills.  Past Medical History:  Diagnosis Date   Hyperlipidemia    Hypertension    Osteopenia    per DEXA on 07-19-09   Stroke ([place]) 09-2007   has mild left leg weakness, saw Dr. Brett Fairy   History reviewed. No pertinent surgical history.  reports that she has never smoked. She has never used smokeless tobacco. She reports that she does not currently use alcohol. She reports that she does not use drugs. Family history is unknown by patient. Allergies  Allergen Reactions   Iodine Itching and Other (See Comments)    "lump in tongue when eats seafood"   Latex Itching    GLOVES    Review of Systems  Constitutional:  Negative for chills and fever.      Objective:     BP (!) 146/80 (BP Location: Left Arm, Patient Position: Sitting, Cuff Size: Normal)   Pulse 65   Temp (!) 97.5 F (36.4 C) (Oral)   Ht '5\' 2"'$  (1.575 m)   Wt 166 lb (75.3 kg)   SpO2 99%   BMI 30.36 kg/m    Physical Exam Vitals reviewed.  Constitutional:      Appearance: Normal appearance.  Cardiovascular:     Rate  and Rhythm: Normal rate.  Pulmonary:     Effort: Pulmonary effort is normal.     Breath sounds: Normal breath sounds.  Musculoskeletal:     Comments: Right hand reveals approximately 3 x 3 cm area of induration with some overlying erythema.  No fluctuance.  Mild warmth.  Mostly nontender.  No drainage.  Neurological:     Mental Status: She is alert.      No results found for any visits on 08/02/22.    The 10-year ASCVD risk score (Arnett DK, et al., 2019) is: 16%    Assessment & Plan:   Cat bite with mild cellulitis right hand.  She has some mild induration but no fluctuance to suggest abscess.  Patient has not had any significant progression through the week in terms of erythema or any fever  -Confirm cat's rabies vaccine status.  If unable to confirm consider going to ER for rabies vaccine series although this sounds like a relatively low risk and especially if the cat has been vaccinated -Tetanus booster given -*Augmentin 875 mg twice daily with food for 10 days -Follow-up for any fever, progressive redness, or other concerns  No follow-ups on file.    Darnell Level  Elease Hashimoto, MD

## 2022-08-07 ENCOUNTER — Ambulatory Visit (HOSPITAL_COMMUNITY): Payer: Medicare Other | Attending: Interventional Cardiology

## 2022-08-07 DIAGNOSIS — I350 Nonrheumatic aortic (valve) stenosis: Secondary | ICD-10-CM

## 2022-08-07 LAB — ECHOCARDIOGRAM COMPLETE
AR max vel: 1.31 cm2
AV Area VTI: 1.3 cm2
AV Area mean vel: 1.36 cm2
AV Mean grad: 17 mmHg
AV Peak grad: 33.6 mmHg
Ao pk vel: 2.9 m/s
Area-P 1/2: 3.3 cm2
P 1/2 time: 573 msec
S' Lateral: 2.7 cm

## 2022-08-16 ENCOUNTER — Other Ambulatory Visit: Payer: Self-pay | Admitting: Family Medicine

## 2022-08-17 NOTE — Progress Notes (Unsigned)
Cardiology Office Note:    Date:  08/19/2022   ID:  Anne [name], DOB 04/19/1946, MRN [id number]  PCP:  Laurey Morale, MD  Cardiologist:  None   Referring MD: Laurey Morale, MD   Chief Complaint  Patient presents with   Coronary Artery Disease   Cardiac Valve Problem    History of Present Illness:    Anne [name] is a 76 y.o. female with a hx of  lightheadedness, prediabetes, CVA, mild aortic stenosis, asymptomatic coronary atherosclerosis with elevated coronary calcium score..   She is not exercising, but otherwise doing well.  She has some anxiety about finding that she has asymptomatic carotid and coronary disease.  Finding that she has aortic valve disease causes her even further stress.  She denies chest pain, excessive shortness of breath, syncope, but has rare brief palpitations.  Past Medical History:  Diagnosis Date   Hyperlipidemia    Hypertension    Osteopenia    per DEXA on 07-19-09   Stroke ([place]) 09-2007   has mild left leg weakness, saw Dr. Brett Fairy    History reviewed. No pertinent surgical history.  Current Medications: Current Meds  Medication Sig   acetaminophen (TYLENOL) 325 MG tablet Take 650 mg by mouth every 6 (six) hours as needed for mild pain.   amLODipine (NORVASC) 10 MG tablet TAKE 1 TABLET BY MOUTH EVERY DAY   aspirin 81 MG tablet Take 81 mg by mouth daily.   benzonatate (TESSALON) 100 MG capsule Take 1 capsule (100 mg total) by mouth 3 (three) times daily as needed for cough.   Calcium-Vitamin D (CALTRATE 600 PLUS-VIT D PO) Take 1 tablet by mouth daily.   COVID-19 mRNA vaccine, Moderna, (MODERNA COVID-19 VACCINE) 100 MCG/0.5ML injection Inject into the muscle.   hydrochlorothiazide (HYDRODIURIL) 25 MG tablet TAKE 1 TABLET BY MOUTH EVERY DAY   KLOR-CON M20 20 MEQ tablet TAKE 1 TABLET BY MOUTH EVERY DAY   loratadine (CLARITIN) 10 MG tablet Take 1 tablet (10 mg total) by mouth daily.   losartan (COZAAR) 50 MG tablet TAKE 1 TABLET BY  MOUTH EVERY DAY   meclizine (ANTIVERT) 25 MG tablet Take 1 tablet (25 mg total) by mouth every 4 (four) hours as needed for dizziness.   Multiple Vitamins-Minerals (CENTRUM SILVER PO) Take 1 tablet by mouth daily.   nirmatrelvir/ritonavir EUA (PAXLOVID) 20 x 150 MG & 10 x '100MG'$  TABS Patient GFR is 79. Take nirmatrelvir (150 mg) two tablets twice daily for 5 days and ritonavir (100 mg) one tablet twice daily for 5 days.   omeprazole (PRILOSEC) 40 MG capsule Take 1 capsule (40 mg total) by mouth daily.   ondansetron (ZOFRAN-ODT) 4 MG disintegrating tablet Take 1 tablet (4 mg total) by mouth every 8 (eight) hours as needed for nausea or vomiting.   rosuvastatin (CRESTOR) 5 MG tablet Take 1 tablet (5 mg total) by mouth daily.   vitamin C (ASCORBIC ACID) 500 MG tablet Take 500 mg by mouth daily.     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  Tobacco Use   Smoking status: Never   Smokeless tobacco: Never  Vaping Use   Vaping Use: Never used  Substance and Sexual Activity   Alcohol use: Not Currently    Comment: rare   Drug use: No   Sexual activity: Not Currently  Other Topics Concern   Not on file  Social History Narrative   Not on file   Social Determinants of Health   Financial Resource Strain: Low Risk  (10/16/2021)   Overall Financial Resource Strain (CARDIA)    Difficulty of Paying Living Expenses: Not hard at all  Food Insecurity: No Food Insecurity (10/16/2021)   Hunger Vital Sign    Worried About Running Out of Food in the Last Year: Never true    Ran Out of Food in the Last Year: Never true  Transportation Needs: No Transportation Needs (10/16/2021)   PRAPARE - Hydrologist (Medical): No    Lack of  Transportation (Non-Medical): No  Physical Activity: Inactive (10/16/2021)   Exercise Vital Sign    Days of Exercise per Week: 0 days    Minutes of Exercise per Session: 0 min  Stress: Stress Concern Present (10/16/2021)   [place]    Feeling of Stress : To some extent  Social Connections: Moderately Isolated (10/03/2020)   Social Connection and Isolation Panel [NHANES]    Frequency of Communication with Friends and Family: More than three times a week    Frequency of Social Gatherings with Friends and Family: Never    Attends Religious Services: 1 to 4 times per year    Active Member of [employer] or Organizations: No    Attends Archivist Meetings: Never    Marital Status: Never married     Family History: The patient's Family history is unknown by patient.  ROS:   Please see the history of present illness.    There is no lower extremity swelling, orthopnea, PND, transient neurological symptoms, claudication, or significant medication side effects.  All other systems reviewed and are negative.  EKGs/Labs/Other Studies Reviewed:    The following studies were reviewed today:  2 D Doppler ECHOCARDIOGRAM 2023 IMPRESSIONS   1. Left ventricular ejection fraction, by estimation, is 60 to 65%. The  left ventricle has normal function. The left ventricle has no regional  wall motion abnormalities. Left ventricular diastolic parameters are  consistent with Grade I diastolic  dysfunction (impaired relaxation).   2. Right ventricular systolic function is hyperdynamic. The right  ventricular size is normal. There is moderately elevated pulmonary artery  systolic pressure. The estimated right ventricular systolic pressure is  29.5 mmHg.   3. MVA via 2D planimetry 3.71 cm2. The mitral valve is degenerative. Mild  mitral valve regurgitation. Mild mitral stenosis. The mean mitral valve  gradient is 2.8 mmHg. Moderate  mitral annular calcification.   4. Tricuspid valve regurgitation is moderate.   5. The aortic valve is tricuspid. Aortic valve regurgitation is mild.  Mild aortic valve stenosis. Aortic valve mean gradient measures 17.0 mmHg.  DVI 0.51. Normal stroke volume index.   Comparison(s): Prior images reviewed side by side. Increased RVSP from  prior; similar AS.   Bilateral carotid Doppler 2022: Summary:  Right Carotid: Velocities in the right ICA are consistent with a 1-39%  stenosis.   Left Carotid: Velocities in the left ICA are consistent with a 1-39%  stenosis.   Vertebrals: Bilateral vertebral arteries demonstrate antegrade flow.  Subclavians: Normal flow hemodynamics were seen in bilateral subclavian  arteries.   *See table(s) above for measurements and observations.   EKG:  EKG normal sinus rhythm, left anterior hemiblock, left atrial abnormality, PAC.  Recent Labs: 03/12/2022: TSH 3.27 06/25/2022: ALT 17; BUN 10; Creatinine, Ser 0.89; Hemoglobin 12.8; Platelets 426; Potassium 3.3; Sodium 136  Recent Lipid Panel    Component Value Date/Time   CHOL 177 03/29/2022 0950   TRIG 115 03/29/2022 0950   HDL 50 03/29/2022 0950   CHOLHDL 3.5 03/29/2022 0950   CHOLHDL 5 03/19/2021 1058   VLDL 29.2 03/19/2021 1058   LDLCALC 106 (H) 03/29/2022 0950   LDLDIRECT 158.7 06/04/2013 1024    Physical Exam:    VS:  BP (!) 140/80   Pulse 65   Ht '5\' 2"'$  (1.575 m)   Wt 165 lb 9.6 oz (75.1 kg)   SpO2 97%   BMI 30.29 kg/m     Wt Readings from Last 3 Encounters:  08/19/22 165 lb 9.6 oz (75.1 kg)  08/02/22 166 lb (75.3 kg)  06/25/22 150 lb (68 kg)     GEN: Compensated. No acute distress HEENT: Normal NECK: No JVD. LYMPHATICS: No lymphadenopathy CARDIAC: 2-3/6 right upper sternal systolic murmur. RRR no gallop, or edema. VASCULAR:  Normal Pulses. No bruits. RESPIRATORY:  Clear to auscultation without rales, wheezing or rhonchi  ABDOMEN: Soft, non-tender, non-distended,  No pulsatile mass, MUSCULOSKELETAL: No deformity  SKIN: Warm and dry NEUROLOGIC:  Alert and oriented x 3 PSYCHIATRIC:  Normal affect   ASSESSMENT:    1. Coronary artery disease involving native coronary artery of native heart without angina pectoris   2. Aortic valve stenosis, etiology of cardiac valve disease unspecified   3. Mixed hyperlipidemia   4. Essential hypertension   5. Diastolic dysfunction   6. Prediabetes    PLAN:    In order of problems listed above:  Continue with antiplatelet therapy, aspirin 81 mg/day and aggressive lipid-lowering. Demonstrated the presence of mild aortic valve stenosis.  No action item.  Needs to be followed.  Discussed with patient and she seems to have understanding of treatment Plan continue rosuvastatin 5 mg/day.  She was on a higher dose management strategy but had symptoms that led to de-escalation of dose.  No current symptoms.  LDL is down by 50% compared to prior.  Recheck lipid panel.  If LDL still above 70, we should consider adding Zetia or increasing rosuvastatin to 10 mg/day. Continue amlodipine, Cozaar, and hydrochlorothiazide. No significant dyspnea.  She does have phenotype that might suggest the presence of diastolic heart failure if dyspnea becomes a problem.    Overall education and awareness concerning primary/secondary risk prevention was discussed in detail: LDL less than 70, hemoglobin A1c less than 7, blood pressure target less than 130/80 mmHg, >150 minutes of moderate aerobic activity per week, avoidance of smoking, weight control (via diet and exercise), and continued surveillance/management of/for obstructive sleep apnea.   Pete echo in 1 year.  She will be assigned a general cardiologist follow-up.   Medication Adjustments/Labs and Tests Ordered: Current medicines are reviewed at length with the patient today.  Concerns regarding medicines are outlined above.  Orders Placed This Encounter  Procedures   Lipid panel    Hepatic function panel   EKG 12-Lead   No orders of the defined types were placed in this encounter.   Patient Instructions  Medication Instructions:  Your physician recommends that you continue on your current medications as directed. Please refer to the Current Medication list given to you today.  *  If you need a refill on your cardiac medications before your next appointment, please call your pharmacy*  Lab Work: In December: fasting Liver and lipid panel  **You can come to the lab (on the 3rd floor) any time between 7:30am and 4:30pm.** If you have labs (blood work) drawn today and your tests are completely normal, you will receive your results only by: [facility] (if you have MyChart) OR A paper copy in the mail If you have any lab test that is abnormal or we need to change your treatment, we will call you to review the results.  Testing/Procedures: NONE  Follow-Up: At [facility], you and your health needs are our priority.  As part of our continuing mission to provide you with exceptional heart care, we have created designated Provider Care Teams.  These Care Teams include your primary Cardiologist (physician) and Advanced Practice Providers (APPs -  Physician Assistants and Nurse Practitioners) who all work together to provide you with the care you need, when you need it.  Your next appointment:   1 year(s)  The format for your next appointment:   In Person  Provider:   Belva Crome, MD  Important Information About Sugar         Signed, Sinclair Grooms, MD  08/19/2022 1:56 PM    [place]

## 2022-08-19 ENCOUNTER — Encounter: Payer: Self-pay | Admitting: Interventional Cardiology

## 2022-08-19 ENCOUNTER — Ambulatory Visit: Payer: Medicare Other | Attending: Interventional Cardiology | Admitting: Interventional Cardiology

## 2022-08-19 VITALS — BP 140/80 | HR 65 | Ht 62.0 in | Wt 165.6 lb

## 2022-08-19 DIAGNOSIS — I10 Essential (primary) hypertension: Secondary | ICD-10-CM

## 2022-08-19 DIAGNOSIS — E782 Mixed hyperlipidemia: Secondary | ICD-10-CM | POA: Diagnosis not present

## 2022-08-19 DIAGNOSIS — I5189 Other ill-defined heart diseases: Secondary | ICD-10-CM | POA: Diagnosis not present

## 2022-08-19 DIAGNOSIS — I2510 Atherosclerotic heart disease of native coronary artery without angina pectoris: Secondary | ICD-10-CM | POA: Diagnosis not present

## 2022-08-19 DIAGNOSIS — R7303 Prediabetes: Secondary | ICD-10-CM | POA: Diagnosis not present

## 2022-08-19 DIAGNOSIS — I350 Nonrheumatic aortic (valve) stenosis: Secondary | ICD-10-CM | POA: Diagnosis not present

## 2022-08-19 NOTE — Patient Instructions (Signed)
Medication Instructions:  Your physician recommends that you continue on your current medications as directed. Please refer to the Current Medication list given to you today.  *If you need a refill on your cardiac medications before your next appointment, please call your pharmacy*  Lab Work: In December: fasting Liver and lipid panel  **You can come to the lab (on the 3rd floor) any time between 7:30am and 4:30pm.** If you have labs (blood work) drawn today and your tests are completely normal, you will receive your results only by: [facility] (if you have MyChart) OR A paper copy in the mail If you have any lab test that is abnormal or we need to change your treatment, we will call you to review the results.  Testing/Procedures: NONE  Follow-Up: At [facility], you and your health needs are our priority.  As part of our continuing mission to provide you with exceptional heart care, we have created designated Provider Care Teams.  These Care Teams include your primary Cardiologist (physician) and Advanced Practice Providers (APPs -  Physician Assistants and Nurse Practitioners) who all work together to provide you with the care you need, when you need it.  Your next appointment:   1 year(s)  The format for your next appointment:   In Person  Provider:   Belva Crome, MD  Important Information About Sugar

## 2022-08-29 ENCOUNTER — Other Ambulatory Visit: Payer: Self-pay

## 2022-09-11 ENCOUNTER — Telehealth: Payer: Medicare Other | Admitting: Family Medicine

## 2022-09-11 ENCOUNTER — Encounter: Payer: Self-pay | Admitting: Family Medicine

## 2022-09-11 VITALS — Temp 97.5°F

## 2022-09-11 DIAGNOSIS — J3489 Other specified disorders of nose and nasal sinuses: Secondary | ICD-10-CM

## 2022-09-11 DIAGNOSIS — R051 Acute cough: Secondary | ICD-10-CM

## 2022-09-11 DIAGNOSIS — R0981 Nasal congestion: Secondary | ICD-10-CM

## 2022-09-11 NOTE — Progress Notes (Signed)
Virtual Visit via Telephone Note  I connected with Anne [name] on 09/18/22 at  3:30 PM EST by telephone and verified that I am speaking with the correct person using two identifiers.   I discussed the limitations, risks, security and privacy concerns of performing an evaluation and management service by telephone and the availability of in person appointments. I also discussed with the patient that there may be a patient responsible charge related to this service. The patient expressed understanding and agreed to proceed.  Location patient: home Location provider: work or home office Participants present for the call: patient, provider Patient did not have a visit in the prior 7 days to address this/these issue(s).   History of Present Illness: Patient is a 76 year old female followed by Dr. Sarajane Jews and seen for acute concern.  Patient requested phone call only visit.  This provider attempted to obtain information regarding current illness and symptoms however patient stated she was unable to talk as she was frustrated about having to drive around the building for testing then back to the front of the building.  Patient states in the process of all this her car battery with not start and she is waiting for AAA.  Patient also states her phone battery is dying and she does not have time to talk to this provider.  Advised patient she could come into the building with a mask on, to charge her phone while she waited on AAA.  Patient declined.  Patient states she had to get off the phone, would call back, then hung up.   I provided 2:37 minutes of non-face-to-face time during this encounter.   Billie Ruddy, MD

## 2022-09-13 ENCOUNTER — Telehealth: Payer: Self-pay | Admitting: Family Medicine

## 2022-09-13 NOTE — Telephone Encounter (Signed)
She had the virtual visit with Dr. Volanda Napoleon

## 2022-09-13 NOTE — Telephone Encounter (Signed)
Pt was scheduled for a virtual visit with Dr Volanda Napoleon on 09/11/22. Please advise

## 2022-09-13 NOTE — Telephone Encounter (Signed)
Pt was unable to completed VV due to her phone died and pt test positive for covid on wed and would like antiviral medication . Pt still has cough and sneezing  [id number] [address] [place]: [phone] Fax: [phone]

## 2022-09-17 ENCOUNTER — Telehealth: Payer: Self-pay | Admitting: Family Medicine

## 2022-09-17 NOTE — Telephone Encounter (Signed)
Pt had a phone visit with Dr. Volanda Napoleon on 09/11/22. Pt was upset and called to say she is still waiting for her prescription.  Pt is upset because she feels dismissed and disregarded by MD.   Pt states MD insisted on a phone visit, when she wanted an in person visit, then only talked to her for a few minutes, not really asking about her symptoms. Pt is also upset that she waited an unreasonable amount of time in the parking lot, her appointment was changed to a phone visit against her wishes, only to be rushed off the phone, and then, never even receiving any medication.  [id number] [place] [phone]

## 2022-09-18 NOTE — Telephone Encounter (Signed)
Called patient to discuss the below. Did not get an answer and VM left for patient to call back into the office to discuss the below.

## 2022-09-18 NOTE — Telephone Encounter (Signed)
Please inform patient that she was listed on this provider schedule as a phone visit only per her request.  It is also interesting that patient feels dismissed and disregarded by this provider when she hung up on this provider as she stated she did not have time to talk as her phone battery was dying and she was waiting for AAA.  Prior to pt hanging up, this provider offered to have patient come into clinic wearing a mask to charge phone.  Patient declined stating she was told she can come in the building.  As such patient had testing done by nurse but never had an actual appointment with this provider.  Phone call with this patient lasted 2 minutes and 37 seconds.  Given this no medication was sent to the pharmacy.  Patient can schedule follow-up with her PCP.

## 2022-09-20 NOTE — Telephone Encounter (Signed)
Spoke to patient she is out of the window of antiviral she reports she is feeling better. She is aware no prescription is available.

## 2022-09-23 ENCOUNTER — Other Ambulatory Visit: Payer: Medicare Other

## 2022-09-26 ENCOUNTER — Other Ambulatory Visit: Payer: Medicare Other

## 2022-10-02 ENCOUNTER — Other Ambulatory Visit: Payer: Medicare Other

## 2022-10-05 ENCOUNTER — Other Ambulatory Visit: Payer: Self-pay | Admitting: Family Medicine

## 2022-10-09 ENCOUNTER — Telehealth: Payer: Self-pay | Admitting: Interventional Cardiology

## 2022-10-09 NOTE — Telephone Encounter (Signed)
Returned call to patient.  Explained to her Dr. Tamala Julian ordered labs to be drawn to follow-up on cholesterol levels to help manage her LDL.  Explained to patient she will need to be fasting for this lab.  Patient verbalized understanding and expressed appreciation for call.

## 2022-10-09 NOTE — Telephone Encounter (Signed)
Patient called to get advice regarding lipid blood test and the reason for this test.

## 2022-10-10 ENCOUNTER — Ambulatory Visit: Payer: Medicare Other | Attending: Interventional Cardiology

## 2022-10-10 DIAGNOSIS — I2510 Atherosclerotic heart disease of native coronary artery without angina pectoris: Secondary | ICD-10-CM | POA: Diagnosis not present

## 2022-10-10 DIAGNOSIS — E782 Mixed hyperlipidemia: Secondary | ICD-10-CM | POA: Diagnosis not present

## 2022-10-10 LAB — HEPATIC FUNCTION PANEL
ALT: 26 IU/L (ref 0–32)
AST: 37 IU/L (ref 0–40)
Albumin: 4.1 g/dL (ref 3.8–4.8)
Alkaline Phosphatase: 111 IU/L (ref 44–121)
Bilirubin Total: 0.6 mg/dL (ref 0.0–1.2)
Bilirubin, Direct: 0.14 mg/dL (ref 0.00–0.40)
Total Protein: 7.1 g/dL (ref 6.0–8.5)

## 2022-10-10 LAB — LIPID PANEL
Chol/HDL Ratio: 4 ratio (ref 0.0–4.4)
Cholesterol, Total: 190 mg/dL (ref 100–199)
HDL: 48 mg/dL (ref >39)
LDL Chol Calc (NIH): 116 mg/dL — ABNORMAL HIGH (ref 0–99)
Triglycerides: 146 mg/dL (ref 0–149)
VLDL Cholesterol Cal: 26 mg/dL (ref 5–40)

## 2022-10-17 ENCOUNTER — Telehealth: Payer: Self-pay | Admitting: Interventional Cardiology

## 2022-10-17 MED ORDER — EZETIMIBE 10 MG PO TABS: 10.0000 mg | ORAL_TABLET | Freq: Every day | ORAL | 1 refills | Status: DC

## 2022-10-17 NOTE — Telephone Encounter (Signed)
° °  Pt is returning call to get lab result °

## 2022-10-17 NOTE — Telephone Encounter (Signed)
Anne Crome, MD 10/11/2022  2:37 PM EST     Let the patient know the LDL is still > 45 points higher than target. Increase Rosuvastatin to mg mg daily or start zetia 10 mg daily and remain on Rosuvastatin current dose. Needs f/u in Lipid clinic to get to Target < LDL 70. A copy will be sent to Stacie Glaze, DO    The patient has been notified of the result and verbalized understanding.  All questions (if any) were answered.  The pt states she will start taking zetia 10 mg po daily and continue her current dose of crestor.   She is aware that I will send a message to our [facility] Scheduling team to call her back to get her in with lipid clinic for management of this.  Pt is an established lipid clinic pt.  Confirmed the pharmacy of choice with the pt.  Pt verbalized understanding and agrees with this plan.

## 2022-10-21 ENCOUNTER — Ambulatory Visit: Payer: Medicare Other

## 2022-10-21 ENCOUNTER — Ambulatory Visit (INDEPENDENT_AMBULATORY_CARE_PROVIDER_SITE_OTHER): Payer: Medicare Other

## 2022-10-21 VITALS — Ht 65.0 in | Wt 165.0 lb

## 2022-10-21 DIAGNOSIS — Z0000 Encounter for general adult medical examination without abnormal findings: Secondary | ICD-10-CM

## 2022-10-21 NOTE — Progress Notes (Addendum)
Subjective:   Anne [name] is a 77 y.o. female who presents for Medicare Annual (Subsequent) preventive examination.  Review of Systems    Virtual Visit via Telephone Note  I connected with  Anne [name] on 10/21/22 at  8:15 AM EST by telephone and verified that I am speaking with the correct person using two identifiers.  Location: Patient: Home Provider: Office Persons participating in the virtual visit: patient/Nurse Health Advisor   I discussed the limitations, risks, security and privacy concerns of performing an evaluation and management service by telephone and the availability of in person appointments. The patient expressed understanding and agreed to proceed.  Interactive audio and video telecommunications were attempted between this nurse and patient, however failed, due to patient having technical difficulties OR patient did not have access to video capability.  We continued and completed visit with audio only.  Some vital signs may be absent or patient reported.   Anne Peaches, LPN  Cardiac Risk Factors include: advanced age (>2mn, >>27women);hypertension     Objective:    Today's Vitals   10/21/22 0932  Weight: 165 lb (74.8 kg)  Height: '5\' 5"'$  (1.651 m)   Body mass index is 27.46 kg/m.     10/21/2022    9:37 AM 06/25/2022   10:05 AM 10/16/2021    8:42 AM 07/16/2021    5:29 PM 06/30/2021   11:58 PM 10/03/2020    8:23 AM 09/03/2018    9:38 AM  Advanced Directives  Does Patient Have a Medical Advance Directive? No No No No No No No  Would patient like information on creating a medical advance directive? No - Patient declined  Yes (MAU/Ambulatory/Procedural Areas - Information given) No - Patient declined  No - Patient declined     Current Medications (verified) Outpatient Encounter Medications as of 10/21/2022  Medication Sig   acetaminophen (TYLENOL) 325 MG tablet Take 650 mg by mouth every 6 (six) hours as needed for mild pain.   amLODipine  (NORVASC) 10 MG tablet TAKE 1 TABLET BY MOUTH EVERY DAY   aspirin 81 MG tablet Take 81 mg by mouth daily.   benzonatate (TESSALON) 100 MG capsule Take 1 capsule (100 mg total) by mouth 3 (three) times daily as needed for cough.   Calcium-Vitamin D (CALTRATE 600 PLUS-VIT D PO) Take 1 tablet by mouth daily.   COVID-19 mRNA vaccine, Moderna, (MODERNA COVID-19 VACCINE) 100 MCG/0.5ML injection Inject into the muscle.   ezetimibe (ZETIA) 10 MG tablet Take 1 tablet (10 mg total) by mouth daily.   hydrochlorothiazide (HYDRODIURIL) 25 MG tablet TAKE 1 TABLET BY MOUTH EVERY DAY   KLOR-CON M20 20 MEQ tablet TAKE 1 TABLET BY MOUTH EVERY DAY   loratadine (CLARITIN) 10 MG tablet Take 1 tablet (10 mg total) by mouth daily.   losartan (COZAAR) 50 MG tablet Take 1 tablet (50 mg total) by mouth daily. SCHEDULE ANNUAL APPT FOR FUTURE REFILLS   meclizine (ANTIVERT) 25 MG tablet Take 1 tablet (25 mg total) by mouth every 4 (four) hours as needed for dizziness.   Multiple Vitamins-Minerals (CENTRUM SILVER PO) Take 1 tablet by mouth daily.   nirmatrelvir/ritonavir EUA (PAXLOVID) 20 x 150 MG & 10 x '100MG'$  TABS Patient GFR is 79. Take nirmatrelvir (150 mg) two tablets twice daily for 5 days and ritonavir (100 mg) one tablet twice daily for 5 days.   omeprazole (PRILOSEC) 40 MG capsule Take 1 capsule (40 mg total) by mouth daily.   ondansetron (ZOFRAN-ODT)  4 MG disintegrating tablet Take 1 tablet (4 mg total) by mouth every 8 (eight) hours as needed for nausea or vomiting.   rosuvastatin (CRESTOR) 5 MG tablet Take 1 tablet (5 mg total) by mouth daily.   vitamin C (ASCORBIC ACID) 500 MG tablet Take 500 mg by mouth daily.   No facility-administered encounter medications on file as of 10/21/2022.    Allergies (verified) Iodine and Latex   History: Past Medical History:  Diagnosis Date   Hyperlipidemia    Hypertension    Osteopenia    per DEXA on 07-19-09   Stroke ([place]) 09-2007   has mild left leg weakness, saw Dr.  Brett Fairy   History reviewed. No pertinent surgical history. Family History  Family history unknown: Yes   Social History   Socioeconomic History   Marital status: Single    Spouse name: Not on file   Number of children: Not on file   Years of education: Not on file   Highest education level: Not on file  Occupational History   Occupation: part itme retail  Tobacco Use   Smoking status: Never   Smokeless tobacco: Never  Vaping Use   Vaping Use: Never used  Substance and Sexual Activity   Alcohol use: Not Currently    Comment: rare   Drug use: No   Sexual activity: Not Currently  Other Topics Concern   Not on file  Social History Narrative   Not on file   Social Determinants of Health   Financial Resource Strain: Low Risk  (10/21/2022)   Overall Financial Resource Strain (CARDIA)    Difficulty of Paying Living Expenses: Not hard at all  Food Insecurity: No Food Insecurity (10/21/2022)   Hunger Vital Sign    Worried About Running Out of Food in the Last Year: Never true    [place] in the Last Year: Never true  Transportation Needs: No Transportation Needs (10/21/2022)   PRAPARE - Hydrologist (Medical): No    Lack of Transportation (Non-Medical): No  Physical Activity: Inactive (10/21/2022)   Exercise Vital Sign    Days of Exercise per Week: 0 days    Minutes of Exercise per Session: 0 min  Stress: No Stress Concern Present (10/21/2022)   [place]    Feeling of Stress : Not at all  Social Connections: Moderately Integrated (10/21/2022)   Social Connection and Isolation Panel [NHANES]    Frequency of Communication with Friends and Family: More than three times a week    Frequency of Social Gatherings with Friends and Family: More than three times a week    Attends Religious Services: More than 4 times per year    Active Member of [employer] or Organizations: Yes    Attends Arts development officer: More than 4 times per year    Marital Status: Never married    Tobacco Counseling Counseling given: Not Answered   Clinical Intake:  Pre-visit preparation completed: No  Pain : No/denies pain     BMI - recorded: 27.46 Nutritional Status: BMI 25 -29 Overweight Nutritional Risks: None Diabetes: No  How often do you need to have someone help you when you read instructions, pamphlets, or other written materials from your doctor or pharmacy?: 1 - Never  Diabetic?  No  Interpreter Needed?: No  Information entered by :: Rolene Arbour LPN   Activities of Daily Living    10/21/2022  9:55 AM  In your present state of health, do you have any difficulty performing the following activities:  Hearing? 0  Vision? 0  Difficulty concentrating or making decisions? 0  Walking or climbing stairs? 0  Dressing or bathing? 0  Doing errands, shopping? 0  Preparing Food and eating ? N  Using the Toilet? N  In the past six months, have you accidently leaked urine? N  Do you have problems with loss of bowel control? N  Managing your Medications? N  Managing your Finances? N  Housekeeping or managing your Housekeeping? N    Patient Care Team: Laurey Morale, MD as PCP - General  Indicate any recent Medical Services you may have received from other than [facility] providers in the past year (date may be approximate).     Assessment:   This is a routine wellness examination for [place].  Hearing/Vision screen Hearing Screening - Comments:: Denies hearing difficulties   Vision Screening - Comments:: Wears rx glasses - up to date with routine eye exams with  [facility]  Dietary issues and exercise activities discussed: Exercise limited by: None identified   Goals Addressed               This Visit's Progress     Increase physical activity (pt-stated)         Depression Screen    10/21/2022    9:36 AM 03/12/2022    9:50 AM 10/16/2021    8:43 AM  10/03/2020    8:20 AM 09/03/2018    9:39 AM 11/09/2014   10:49 AM  PHQ 2/9 Scores  PHQ - 2 Score 0 '1 1 1 1 '$ 0  PHQ- 9 Score  2        Fall Risk    10/21/2022    9:37 AM 03/12/2022    9:51 AM 10/16/2021    8:43 AM 04/23/2021    1:34 PM 10/03/2020    8:24 AM  Fall Risk   Falls in the past year? 0 1 0 1 0  Number falls in past yr: 0 0  0 0  Injury with Fall? 0 1  0 0  Risk for fall due to : No Fall Risks Impaired balance/gait Medication side effect No Fall Risks Impaired vision  Follow up Falls prevention discussed Falls evaluation completed Falls evaluation completed;Education provided;Falls prevention discussed Follow up appointment Falls prevention discussed  Comment    [facility]     FALL RISK PREVENTION PERTAINING TO THE HOME:  Any stairs in or around the home? Yes  If so, are there any without handrails? No  Home free of loose throw rugs in walkways, pet beds, electrical cords, etc? Yes  Adequate lighting in your home to reduce risk of falls? Yes   ASSISTIVE DEVICES UTILIZED TO PREVENT FALLS:  Life alert? No  Use of a cane, walker or w/c? No  Grab bars in the bathroom? Yes  Shower chair or bench in shower? Yes  Elevated toilet seat or a handicapped toilet? Yes   TIMED UP AND GO:  Was the test performed? No . Audio Visit    Cognitive Function:        10/21/2022    9:38 AM 10/16/2021    8:47 AM 10/03/2020    8:27 AM  6CIT Screen  What Year? 0 points 0 points 0 points  What month? 0 points 0 points 0 points  What time? 0 points 0 points   Count back from 20  0 points 0 points 0 points  Months in reverse 2 points 0 points 4 points  Repeat phrase 0 points 4 points 0 points  Total Score 2 points 4 points     Immunizations Immunization History  Administered Date(s) Administered   Fluad Quad(high Dose 65+) 07/30/2019, 09/27/2020, 08/29/2021, 08/01/2022   Influenza, High Dose Seasonal PF 11/17/2015, 10/18/2016, 10/23/2017, 07/28/2018   Moderna Covid-19  Vaccine Bivalent Booster 26yr & up 08/14/2021   Moderna SARS-COV2 Booster Vaccination 08/31/2020, 05/04/2021   Moderna Sars-Covid-2 Vaccination 08/31/2020   Tdap 08/02/2022    TDAP status: Up to date  Flu Vaccine status: Up to date  Pneumococcal vaccine status: Due, Education has been provided regarding the importance of this vaccine. Advised may receive this vaccine at local pharmacy or Health Dept. Aware to provide a copy of the vaccination record if obtained from local pharmacy or Health Dept. Verbalized acceptance and understanding.  Covid-19 vaccine status: Completed vaccines  Qualifies for Shingles Vaccine? Yes   Zostavax completed No   Shingrix Completed?: No.    Education has been provided regarding the importance of this vaccine. Patient has been advised to call insurance company to determine out of pocket expense if they have not yet received this vaccine. Advised may also receive vaccine at local pharmacy or Health Dept. Verbalized acceptance and understanding.  Screening Tests Health Maintenance  Topic Date Due   COVID-19 Vaccine (5 - 2023-24 season) 11/06/2022 (Originally 06/14/2022)   Zoster Vaccines- Shingrix (1 of 2) 12/12/2022 (Originally 07/15/1997)   Pneumonia Vaccine 76 Years old (1 - PCV) 10/22/2023 (Originally 07/15/2012)   COLONOSCOPY (Pts 45-455yrInsurance coverage will need to be confirmed)  10/22/2023 (Originally 07/15/1992)   Medicare Annual Wellness (AWV)  10/22/2023   DTaP/Tdap/Td (2 - Td or Tdap) 08/02/2032   INFLUENZA VACCINE  Completed   DEXA SCAN  Completed   Hepatitis C Screening  Completed   HPV VACCINES  Aged Out    Health Maintenance  There are no preventive care reminders to display for this patient.   Colorectal cancer screening: Referral to GI placed Patient deferred. Pt aware the office will call re: appt.  Mammogram status: No longer required due to Age.  Bone Density status: Completed 04/25/20. Results reflect: Bone density results:  OSTEOPOROSIS. Repeat every   years.  Lung Cancer Screening: (Low Dose CT Chest recommended if Age 372-80ears, 30 pack-year currently smoking OR have quit w/in 15years.) does not qualify.     Additional Screening:  Hepatitis C Screening: does qualify; Completed 09/03/18  Vision Screening: Recommended annual ophthalmology exams for early detection of glaucoma and other disorders of the eye. Is the patient up to date with their annual eye exam?  Yes  Who is the provider or what is the name of the office in which the patient attends annual eye exams? [facility] is not established with a provider, would they like to be referred to a provider to establish care? No .   Dental Screening: Recommended annual dental exams for proper oral hygiene  Community Resource Referral / Chronic Care Management:  CRR required this visit?  No   CCM required this visit?  No      Plan:     I have personally reviewed and noted the following in the patient's chart:   Medical and social history Use of alcohol, tobacco or illicit drugs  Current medications and supplements including opioid prescriptions. Patient is not currently taking opioid prescriptions. Functional ability and status Nutritional status  Physical activity Advanced directives List of other physicians Hospitalizations, surgeries, and ER visits in previous 12 months Vitals Screenings to include cognitive, depression, and falls Referrals and appointments  In addition, I have reviewed and discussed with patient certain preventive protocols, quality metrics, and best practice recommendations. A written personalized care plan for preventive services as well as general preventive health recommendations were provided to patient.     Anne Peaches, LPN   10/17/7090   Nurse Notes: None

## 2022-10-21 NOTE — Patient Instructions (Addendum)
[name] , Thank you for taking time to come for your Medicare Wellness Visit. I appreciate your ongoing commitment to your health goals. Please review the following plan we discussed and let me know if I can assist you in the future.   These are the goals we discussed:  Goals       Increase physical activity (pt-stated)      Patient Stated      Lose weight       Patient Stated      10/16/2021, lose weight and start exercise      Weight (lb) < 200 lb (90.7 kg)      Look at her diet carefully!          This is a list of the screening recommended for you and due dates:  Health Maintenance  Topic Date Due   COVID-19 Vaccine (5 - 2023-24 season) 11/06/2022*   Zoster (Shingles) Vaccine (1 of 2) 12/12/2022*   Pneumonia Vaccine (1 - PCV) 10/22/2023*   Colon Cancer Screening  10/22/2023*   Medicare Annual Wellness Visit  10/22/2023   DTaP/Tdap/Td vaccine (2 - Td or Tdap) 08/02/2032   Flu Shot  Completed   DEXA scan (bone density measurement)  Completed   Hepatitis C Screening: USPSTF Recommendation to screen - Ages 43-79 yo.  Completed   HPV Vaccine  Aged Out  *Topic was postponed. The date shown is not the original due date.    Advanced directives: Advance directive discussed with you today. Even though you declined this today, please call our office should you change your mind, and we can give you the proper paperwork for you to fill out.   Conditions/risks identified: None  Next appointment: Follow up in one year for your annual wellness visit     Preventive Care 65 Years and Older, Female Preventive care refers to lifestyle choices and visits with your health care provider that can promote health and wellness. What does preventive care include? A yearly physical exam. This is also called an annual well check. Dental exams once or twice a year. Routine eye exams. Ask your health care provider how often you should have your eyes checked. Personal lifestyle choices,  including: Daily care of your teeth and gums. Regular physical activity. Eating a healthy diet. Avoiding tobacco and drug use. Limiting alcohol use. Practicing safe sex. Taking low-dose aspirin every day. Taking vitamin and mineral supplements as recommended by your health care provider. What happens during an annual well check? The services and screenings done by your health care provider during your annual well check will depend on your age, overall health, lifestyle risk factors, and family history of disease. Counseling  Your health care provider may ask you questions about your: Alcohol use. Tobacco use. Drug use. Emotional well-being. Home and relationship well-being. Sexual activity. Eating habits. History of falls. Memory and ability to understand (cognition). Work and work Statistician. Reproductive health. Screening  You may have the following tests or measurements: Height, weight, and BMI. Blood pressure. Lipid and cholesterol levels. These may be checked every 5 years, or more frequently if you are over 9 years old. Skin check. Lung cancer screening. You may have this screening every year starting at age 86 if you have a 30-pack-year history of smoking and currently smoke or have quit within the past 15 years. Fecal occult blood test (FOBT) of the stool. You may have this test every year starting at age 71. Flexible sigmoidoscopy or colonoscopy. You may have  a sigmoidoscopy every 5 years or a colonoscopy every 10 years starting at age 14. Hepatitis C blood test. Hepatitis B blood test. Sexually transmitted disease (STD) testing. Diabetes screening. This is done by checking your blood sugar (glucose) after you have not eaten for a while (fasting). You may have this done every 1-3 years. Bone density scan. This is done to screen for osteoporosis. You may have this done starting at age 25. Mammogram. This may be done every 1-2 years. Talk to your health care provider  about how often you should have regular mammograms. Talk with your health care provider about your test results, treatment options, and if necessary, the need for more tests. Vaccines  Your health care provider may recommend certain vaccines, such as: Influenza vaccine. This is recommended every year. Tetanus, diphtheria, and acellular pertussis (Tdap, Td) vaccine. You may need a Td booster every 10 years. Zoster vaccine. You may need this after age 23. Pneumococcal 13-valent conjugate (PCV13) vaccine. One dose is recommended after age 72. Pneumococcal polysaccharide (PPSV23) vaccine. One dose is recommended after age 77. Talk to your health care provider about which screenings and vaccines you need and how often you need them. This information is not intended to replace advice given to you by your health care provider. Make sure you discuss any questions you have with your health care provider. Document Released: 10/27/2015 Document Revised: 06/19/2016 Document Reviewed: 08/01/2015 Elsevier Interactive Patient Education  2017 [employer] Prevention in the Home Falls can cause injuries. They can happen to people of all ages. There are many things you can do to make your home safe and to help prevent falls. What can I do on the outside of my home? Regularly fix the edges of walkways and driveways and fix any cracks. Remove anything that might make you trip as you walk through a door, such as a raised step or threshold. Trim any bushes or trees on the path to your home. Use bright outdoor lighting. Clear any walking paths of anything that might make someone trip, such as rocks or tools. Regularly check to see if handrails are loose or broken. Make sure that both sides of any steps have handrails. Any raised decks and porches should have guardrails on the edges. Have any leaves, snow, or ice cleared regularly. Use sand or salt on walking paths during winter. Clean up any spills in  your garage right away. This includes oil or grease spills. What can I do in the bathroom? Use night lights. Install grab bars by the toilet and in the tub and shower. Do not use towel bars as grab bars. Use non-skid mats or decals in the tub or shower. If you need to sit down in the shower, use a plastic, non-slip stool. Keep the floor dry. Clean up any water that spills on the floor as soon as it happens. Remove soap buildup in the tub or shower regularly. Attach bath mats securely with double-sided non-slip rug tape. Do not have throw rugs and other things on the floor that can make you trip. What can I do in the bedroom? Use night lights. Make sure that you have a light by your bed that is easy to reach. Do not use any sheets or blankets that are too big for your bed. They should not hang down onto the floor. Have a firm chair that has side arms. You can use this for support while you get dressed. Do not have throw rugs and  other things on the floor that can make you trip. What can I do in the kitchen? Clean up any spills right away. Avoid walking on wet floors. Keep items that you use a lot in easy-to-reach places. If you need to reach something above you, use a strong step stool that has a grab bar. Keep electrical cords out of the way. Do not use floor polish or wax that makes floors slippery. If you must use wax, use non-skid floor wax. Do not have throw rugs and other things on the floor that can make you trip. What can I do with my stairs? Do not leave any items on the stairs. Make sure that there are handrails on both sides of the stairs and use them. Fix handrails that are broken or loose. Make sure that handrails are as long as the stairways. Check any carpeting to make sure that it is firmly attached to the stairs. Fix any carpet that is loose or worn. Avoid having throw rugs at the top or bottom of the stairs. If you do have throw rugs, attach them to the floor with carpet  tape. Make sure that you have a light switch at the top of the stairs and the bottom of the stairs. If you do not have them, ask someone to add them for you. What else can I do to help prevent falls? Wear shoes that: Do not have high heels. Have rubber bottoms. Are comfortable and fit you well. Are closed at the toe. Do not wear sandals. If you use a stepladder: Make sure that it is fully opened. Do not climb a closed stepladder. Make sure that both sides of the stepladder are locked into place. Ask someone to hold it for you, if possible. Clearly mark and make sure that you can see: Any grab bars or handrails. First and last steps. Where the edge of each step is. Use tools that help you move around (mobility aids) if they are needed. These include: Canes. Walkers. Scooters. Crutches. Turn on the lights when you go into a dark area. Replace any light bulbs as soon as they burn out. Set up your furniture so you have a clear path. Avoid moving your furniture around. If any of your floors are uneven, fix them. If there are any pets around you, be aware of where they are. Review your medicines with your doctor. Some medicines can make you feel dizzy. This can increase your chance of falling. Ask your doctor what other things that you can do to help prevent falls. This information is not intended to replace advice given to you by your health care provider. Make sure you discuss any questions you have with your health care provider. Document Released: 07/27/2009 Document Revised: 03/07/2016 Document Reviewed: 11/04/2014 Elsevier Interactive Patient Education  2017 [employer].

## 2022-11-20 ENCOUNTER — Other Ambulatory Visit: Payer: Self-pay | Admitting: Family Medicine

## 2022-11-21 ENCOUNTER — Other Ambulatory Visit: Payer: Self-pay | Admitting: Family Medicine

## 2022-11-25 ENCOUNTER — Other Ambulatory Visit: Payer: Self-pay | Admitting: Family Medicine

## 2022-11-25 DIAGNOSIS — Z1231 Encounter for screening mammogram for malignant neoplasm of breast: Secondary | ICD-10-CM

## 2022-11-26 ENCOUNTER — Ambulatory Visit: Payer: Medicare Other | Attending: Cardiovascular Disease | Admitting: Pharmacist

## 2022-11-26 DIAGNOSIS — E782 Mixed hyperlipidemia: Secondary | ICD-10-CM | POA: Diagnosis not present

## 2022-11-26 NOTE — Progress Notes (Signed)
Patient ID: CAEDYN COGDILL                 DOB: [birth year]                    MRN: [id number]      HPI: Anne [name] is a 77 y.o. female patient referred to lipid clinic by Dr. Tamala Julian. PMH is significant for lightheadedness, prediabetes, mild aortic stenosis, asymptomatic coronary atherosclerosis with elevated coronary calcium score, stroke. Coronary calcium score of 176 in 2022. CVA per chart.  Patient previously seen by lipid clinic. Pravastatin was switched to rosuvastatin 33m daily. LDL-C had improved, but was still above goal. Ezetimibe 114mdaily was added 10/17/22.   Patient presents today to lipid clinic. She reiterates that she has not done all the things that were asked of her such as exercising. She admits she has plenty of excuses. 5 people died in [id number] is still upset about her brother-in-laws death. Has had some issues with depression. Doesn't want to walk outside due to a [place]    Current Medications: rosuvastatin 59m60maily, ezetimibe 51m92mily Intolerances: simvastatin (leg pain), pravastatin  Risk Factors:  LDL-C goal: <70 ApoB goal: <80  Diet: she says she "doesn't eat junk"   Exercise: none  Family History: Family history is unknown by patient.   Social History:  Social History   Socioeconomic History   Marital status: Single    Spouse name: Not on file   Number of children: Not on file   Years of education: Not on file   Highest education level: Not on file  Occupational History   Occupation: part itme retail  Tobacco Use   Smoking status: Never   Smokeless tobacco: Never  Vaping Use   Vaping Use: Never used  Substance and Sexual Activity   Alcohol use: Not Currently    Comment: rare   Drug use: No   Sexual activity: Not Currently  Other Topics Concern   Not on file  Social History Narrative   Not on file   Social Determinants of Health   Financial Resource Strain: Low Risk  (10/21/2022)    Overall Financial Resource Strain (CARDIA)    Difficulty of Paying Living Expenses: Not hard at all  Food Insecurity: No Food Insecurity (10/21/2022)   Hunger Vital Sign    Worried About Running Out of Food in the Last Year: Never true    Ran Raleighthe Last Year: Never true  Transportation Needs: No Transportation Needs (10/21/2022)   PRAPARE - TranHydrologistdical): No    Lack of Transportation (Non-Medical): No  Physical Activity: Inactive (10/21/2022)   Exercise Vital Sign    Days of Exercise per Week: 0 days    Minutes of Exercise per Session: 0 min  Stress: No Stress Concern Present (10/21/2022)   FinnCampobelloFeeling of Stress : Not at all  Social Connections: Moderately Integrated (10/21/2022)   Social Connection and Isolation Panel [NHANES]    Frequency of Communication with Friends and Family: More than three times a week    Frequency of Social Gatherings with Friends and Family: More than three times a week    Attends Religious Services: More than 4 times per year    Active Member of [employer]: Yes    Attends Club or  Organization Meetings: More than 4 times per year    Marital Status: Never married  Intimate Partner Violence: Not At Risk (10/21/2022)   Humiliation, Afraid, Rape, and Kick questionnaire    Fear of Current or Ex-Partner: No    Emotionally Abused: No    Physically Abused: No    Sexually Abused: No     Labs: 12/31/21 TC 221, TG 115, HDL 59, LDL-C 142 (pravastatin 41m MWF)  10/10/22 TC 190, TG 146, HDL 48, LDL-C 116 (rosuvastatin 522mdaily) Lipid Panel     Component Value Date/Time   CHOL 190 10/10/2022 0919   TRIG 146 10/10/2022 0919   HDL 48 10/10/2022 0919   CHOLHDL 4.0 10/10/2022 0919   CHOLHDL 5 03/19/2021 1058   VLDL 29.2 03/19/2021 1058   LDLCALC 116 (H) 10/10/2022 0919   LDLDIRECT 158.7 06/04/2013 1024   LABVLDL 26 10/10/2022 0919     Past Medical History:  Diagnosis Date   Hyperlipidemia    Hypertension    Osteopenia    per DEXA on 07-19-09   Stroke ([place]12-2008   has mild left leg weakness, saw Dr. DoBrett Fairy  Current Outpatient Medications on File Prior to Visit  Medication Sig Dispense Refill   acetaminophen (TYLENOL) 325 MG tablet Take 650 mg by mouth every 6 (six) hours as needed for mild pain.     amLODipine (NORVASC) 10 MG tablet Take 1 tablet (10 mg total) by mouth daily. *Appointment required for future refills 90 tablet 0   aspirin 81 MG tablet Take 81 mg by mouth daily.     benzonatate (TESSALON) 100 MG capsule Take 1 capsule (100 mg total) by mouth 3 (three) times daily as needed for cough. 21 capsule 0   Calcium-Vitamin D (CALTRATE 600 PLUS-VIT D PO) Take 1 tablet by mouth daily.     COVID-19 mRNA vaccine, Moderna, (MODERNA COVID-19 VACCINE) 100 MCG/0.5ML injection Inject into the muscle. 0.25 mL 0   ezetimibe (ZETIA) 10 MG tablet Take 1 tablet (10 mg total) by mouth daily. 90 tablet 1   hydrochlorothiazide (HYDRODIURIL) 25 MG tablet TAKE 1 TABLET BY MOUTH EVERY DAY 90 tablet 0   KLOR-CON M20 20 MEQ tablet TAKE 1 TABLET BY MOUTH EVERY DAY 90 tablet 3   loratadine (CLARITIN) 10 MG tablet Take 1 tablet (10 mg total) by mouth daily. 30 tablet 11   losartan (COZAAR) 50 MG tablet TAKE 1 TABLET (50 MG TOTAL) BY MOUTH DAILY. SCHEDULE ANNUAL APPT FOR FUTURE REFILLS 90 tablet 0   meclizine (ANTIVERT) 25 MG tablet Take 1 tablet (25 mg total) by mouth every 4 (four) hours as needed for dizziness. 60 tablet 2   Multiple Vitamins-Minerals (CENTRUM SILVER PO) Take 1 tablet by mouth daily.     nirmatrelvir/ritonavir EUA (PAXLOVID) 20 x 150 MG & 10 x 100MG TABS Patient GFR is 79. Take nirmatrelvir (150 mg) two tablets twice daily for 5 days and ritonavir (100 mg) one tablet twice daily for 5 days. 30 tablet 0   omeprazole (PRILOSEC) 40 MG capsule Take 1 capsule (40 mg total) by mouth daily. 30 capsule 3    ondansetron (ZOFRAN-ODT) 4 MG disintegrating tablet Take 1 tablet (4 mg total) by mouth every 8 (eight) hours as needed for nausea or vomiting. 10 tablet 0   rosuvastatin (CRESTOR) 5 MG tablet Take 1 tablet (5 mg total) by mouth daily. 90 tablet 3   vitamin C (ASCORBIC ACID) 500 MG tablet Take 500 mg by mouth daily.  No current facility-administered medications on file prior to visit.    Allergies  Allergen Reactions   Iodine Itching and Other (See Comments)    "lump in tongue when eats seafood"   Latex Itching    GLOVES    Assessment/Plan:  1. Hyperlipidemia -  Hyperlipidemia Assessment: Discussed with patient medication options if LDL-C is not at goal on rosuvastatin and ezetimibe. She does not seem interested in changes Reviewed that exercise is very important for blood sugar, blood pressure, mood and can help some with cholesterol Encouraged her to do workout videos or walk inside her home if she doesn't want to walk the neighborhood or go to the [facility]  Plan: Check LFT, lipids in April  Start exercising Continue rosuvastatin 24m daily and ezetimibe 169mdaily Can consider PCSK9i if LDL-C is >70, but patient does not seem interested.    Thank you,  MeRamond DialPharm.D, BCPS, CPP [facility] [address]Phone: [phone]: [phone]

## 2022-11-26 NOTE — Assessment & Plan Note (Signed)
Assessment: Discussed with patient medication options if LDL-C is not at goal on rosuvastatin and ezetimibe. She does not seem interested in changes Reviewed that exercise is very important for blood sugar, blood pressure, mood and can help some with cholesterol Encouraged her to do workout videos or walk inside her home if she doesn't want to walk the neighborhood or go to the [facility]  Plan: Check LFT, lipids in April  Start exercising Continue rosuvastatin 68m daily and ezetimibe 143mdaily Can consider PCSK9i if LDL-C is >70, but patient does not seem interested.

## 2022-11-26 NOTE — Patient Instructions (Addendum)
Start exercising- walking (moderate pace) and home exercises (squat to chair, chair yoga ect).  Continue rosuvastatin 91m daily, ezetimibe 122mdaily  Recheck labs April 23. You can come to the lab anytime from 7:15AM to 4:30 PM. Labs should be fasting for 8-12 hours  Please call me at [phone] any questions

## 2022-12-05 ENCOUNTER — Other Ambulatory Visit: Payer: Self-pay | Admitting: Family Medicine

## 2022-12-05 ENCOUNTER — Ambulatory Visit
Admission: RE | Admit: 2022-12-05 | Discharge: 2022-12-05 | Disposition: A | Discharge status: Home / Self Care | Payer: Medicare Other | Source: Ambulatory Visit | Attending: Family Medicine | Admitting: Family Medicine

## 2022-12-05 DIAGNOSIS — Z1231 Encounter for screening mammogram for malignant neoplasm of breast: Secondary | ICD-10-CM

## 2022-12-06 ENCOUNTER — Telehealth: Payer: Self-pay | Admitting: Family Medicine

## 2022-12-06 DIAGNOSIS — H04123 Dry eye syndrome of bilateral lacrimal glands: Secondary | ICD-10-CM

## 2022-12-06 NOTE — Telephone Encounter (Signed)
Spoke with Anne [name] aware that mammogram report has not been released for Dr Sarajane Jews to review, advised Anne [name] she will get a call once the results are on epic, verbalized understanding

## 2022-12-06 NOTE — Telephone Encounter (Signed)
FYI

## 2022-12-06 NOTE — Telephone Encounter (Signed)
Requesting to go over mammo results

## 2022-12-06 NOTE — Telephone Encounter (Signed)
Patient states she received a call from this number but no message was left. Returning call for mammogram results, does not want to worry about it over the weekend

## 2022-12-06 NOTE — Telephone Encounter (Signed)
Attempted to return pt call with no success. Pt voicemail is not set up, will try later

## 2022-12-16 NOTE — Telephone Encounter (Signed)
Patient requesting call to discuss results

## 2022-12-16 NOTE — Telephone Encounter (Signed)
Awaiting reply from MD.

## 2022-12-17 NOTE — Telephone Encounter (Signed)
Her mammogram was normal. She will be due for another one in a year

## 2022-12-18 NOTE — Telephone Encounter (Signed)
Spoke with patient message given concerning mammogram results.    Patient has concerns about not producing tears.   She is requesting a referral to an Optometrist.

## 2022-12-19 NOTE — Telephone Encounter (Signed)
She should see an ophthalmologist (an MD) for this. I will do the referral

## 2022-12-19 NOTE — Addendum Note (Signed)
Addended by: Alysia Penna A on: 12/19/2022 08:26 AM   Modules accepted: Orders

## 2022-12-20 NOTE — Telephone Encounter (Signed)
Called patient at [phone], lvm referral was place and to call back with any questions or concerns.

## 2023-01-12 ENCOUNTER — Other Ambulatory Visit: Payer: Self-pay | Admitting: Family Medicine

## 2023-01-27 ENCOUNTER — Telehealth: Payer: Self-pay | Admitting: Cardiology

## 2023-01-27 NOTE — Telephone Encounter (Signed)
Yes ezetimibe can cause GI issues.  She has lipid labs scheduled in a week or two to assess efficacy of adding ezetimibe to her rosuvastatin. Would see if she's willing to continue on current regimen until her labs (could also move date up) to see how her cholesterol control is or if changes need to be made.  Otherwise, could consider decreasing her ezetimibe to 5mg  - can cut 10mg  tablet in half which can be tolerated better GI wise but still has similar efficacy to 10mg  dose.

## 2023-01-27 NOTE — Telephone Encounter (Signed)
Will reach out to PharmD team and Dr. Shari Prows for further assistance with zetia side effect of causing the pt gas/stomach issues, and to further advise on this.  Will follow-up with the pt accordingly thereafter.

## 2023-01-27 NOTE — Telephone Encounter (Signed)
Called the pt and endorsed PharmD's recommendations.  She states she will continue the zetia 10 mg dose but just really wanted to make sure that this wouldn't cause her harm like stomach cancer, if she continues taking.  Pt aware that it will not cause that but can cause GI issues.  Advised her if tolerability is low with the 10 mg dose, she can always call us back and we can cut it in 1/2 for her, taking 5 mg po daily.  Pt states she will continue the 10 mg dose and come in for lab as scheduled.  Pt verbalized understanding and agrees with this plan.  Pt was appreciative for all the assistance provided.

## 2023-01-27 NOTE — Telephone Encounter (Signed)
Pt c/o medication issue:  1. Name of Medication: ezetimibe (ZETIA) 10 MG tablet   2. How are you currently taking this medication (dosage and times per day)?   3. Are you having a reaction (difficulty breathing--STAT)?   4. What is your medication issue? Pt states that with this medication, it gives her gas. She states she wants to know if Dr. Shari Prows thinks she should continue taking it as she doesn't want to take it if it'll kill her. She would like a c/b.

## 2023-02-04 ENCOUNTER — Ambulatory Visit: Payer: Medicare Other | Attending: Interventional Cardiology

## 2023-02-04 DIAGNOSIS — E782 Mixed hyperlipidemia: Secondary | ICD-10-CM

## 2023-02-05 LAB — HEPATIC FUNCTION PANEL
ALT: 18 IU/L (ref 0–32)
AST: 22 IU/L (ref 0–40)
Albumin: 4.1 g/dL (ref 3.8–4.8)
Alkaline Phosphatase: 97 IU/L (ref 44–121)
Bilirubin Total: 0.5 mg/dL (ref 0.0–1.2)
Bilirubin, Direct: 0.15 mg/dL (ref 0.00–0.40)
Total Protein: 6.9 g/dL (ref 6.0–8.5)

## 2023-02-05 LAB — LIPID PANEL
Chol/HDL Ratio: 2.9 ratio (ref 0.0–4.4)
Cholesterol, Total: 144 mg/dL (ref 100–199)
HDL: 49 mg/dL (ref >39)
LDL Chol Calc (NIH): 76 mg/dL (ref 0–99)
Triglycerides: 104 mg/dL (ref 0–149)
VLDL Cholesterol Cal: 19 mg/dL (ref 5–40)

## 2023-02-10 ENCOUNTER — Telehealth: Payer: Self-pay | Admitting: Pharmacist

## 2023-02-10 NOTE — Telephone Encounter (Signed)
Called pt to review lipid results. LDL-C 76, non HDL-C 95 LDL-C close to goal and non-HDL-C at goal. Continue ezetimbie and rosuvastatin 5mg  daily. LVM for pt to call back.

## 2023-02-11 NOTE — Telephone Encounter (Signed)
Result has been communicated with patient.

## 2023-02-14 ENCOUNTER — Other Ambulatory Visit: Payer: Self-pay | Admitting: Family Medicine

## 2023-02-16 ENCOUNTER — Other Ambulatory Visit: Payer: Self-pay | Admitting: Family Medicine

## 2023-02-17 ENCOUNTER — Other Ambulatory Visit: Payer: Self-pay | Admitting: Family Medicine

## 2023-02-17 ENCOUNTER — Other Ambulatory Visit: Payer: Self-pay

## 2023-02-17 MED ORDER — ROSUVASTATIN CALCIUM 5 MG PO TABS: 5.0000 mg | ORAL_TABLET | Freq: Every day | ORAL | 1 refills | Status: DC

## 2023-03-10 ENCOUNTER — Encounter (HOSPITAL_COMMUNITY): Payer: Self-pay | Admitting: Emergency Medicine

## 2023-03-10 ENCOUNTER — Other Ambulatory Visit: Payer: Self-pay

## 2023-03-10 ENCOUNTER — Ambulatory Visit (HOSPITAL_COMMUNITY)
Admission: EM | Admit: 2023-03-10 | Discharge: 2023-03-10 | Disposition: A | Discharge status: Home / Self Care | Payer: Medicare Other | Attending: Physician Assistant | Admitting: Physician Assistant

## 2023-03-10 DIAGNOSIS — W5501XA Bitten by cat, initial encounter: Secondary | ICD-10-CM | POA: Diagnosis not present

## 2023-03-10 DIAGNOSIS — S61032A Puncture wound without foreign body of left thumb without damage to nail, initial encounter: Secondary | ICD-10-CM

## 2023-03-10 MED ORDER — AMOXICILLIN-POT CLAVULANATE 875-125 MG PO TABS: 1.0000 | ORAL_TABLET | Freq: Two times a day (BID) | ORAL | 0 refills | Status: DC

## 2023-03-10 MED ORDER — MUPIROCIN 2 % EX OINT: 1.0000 | TOPICAL_OINTMENT | Freq: Every day | CUTANEOUS | 0 refills | Status: DC

## 2023-03-10 NOTE — ED Triage Notes (Signed)
Patient c/o having a cat bite on her left thumb since last night.

## 2023-03-10 NOTE — ED Provider Notes (Signed)
[facility]    CSN: [id number] Arrival date & time: 03/10/23  7829      History   Chief Complaint Chief Complaint  Patient presents with   Animal Bite    Patient c/o having a cat bite on her left thumb since last night.    HPI CAMAY FINAMORE is a 77 y.o. female.   Patient presents today for evaluation of cat bite that occurred yesterday evening.  Reports that she was inspecting her cats lower leg when it turned around and bit her on the left thumb.  She reports that this is an indoor cat that is up-to-date on its vaccines and has no concern for rabies.  She believes that she is up-to-date on her tetanus and review of chart indicates this was last given 08/02/2022.  She has cleaned this area with alcohol and hydrogen peroxide.  She is right-handed.  Denies any numbness or paresthesias in the thumb.  Pain is rated 3 on a 0-10 pain scale, described as throbbing, no aggravating or alleviating factors identified.  She denies any recent antibiotic use.    Past Medical History:  Diagnosis Date   Hyperlipidemia    Hypertension    Osteopenia    per DEXA on 07-19-09   Stroke (HCC) 09-2007   has mild left leg weakness, saw Dr. Vickey Huger    Patient Active Problem List   Diagnosis Date Noted   COVID-19 virus infection 05/27/2022   GERD (gastroesophageal reflux disease) 08/29/2021   Bradycardia, unspecified 01/02/2018   Mallet deformity of fourth finger, right 02/02/2014   ALLERGIC RHINITIS 05/08/2010   DIZZINESS 02/01/2010   LIPOMA 08/11/2009   OSTEOPENIA 08/11/2009   SUPRAVENTRICULAR PREMATURE BEATS 07/28/2008   FATIGUE 06/16/2008   MYOSITIS 05/12/2008   Hyperlipidemia 11/24/2007   Essential hypertension 11/24/2007   History of cardiovascular disorder 11/24/2007    History reviewed. No pertinent surgical history.  OB History   No obstetric history on file.      Home Medications    Prior to Admission medications   Medication Sig Start Date End Date Taking?  Authorizing Provider  amoxicillin-clavulanate (AUGMENTIN) 875-125 MG tablet Take 1 tablet by mouth every 12 (twelve) hours. 03/10/23  Yes Maycol Hoying, Denny Peon K, PA-C  mupirocin ointment (BACTROBAN) 2 % Apply 1 Application topically daily. 03/10/23  Yes Mirriam Vadala, Noberto Retort, PA-C  acetaminophen (TYLENOL) 325 MG tablet Take 650 mg by mouth every 6 (six) hours as needed for mild pain.    [provider]  amLODipine (NORVASC) 10 MG tablet TAKE 1 TABLET (10 MG TOTAL) BY MOUTH DAILY. *APPOINTMENT REQUIRED FOR FUTURE REFILLS 02/17/23   Nelwyn Salisbury, MD  aspirin 81 MG tablet Take 81 mg by mouth daily.    [provider]  benzonatate (TESSALON) 100 MG capsule Take 1 capsule (100 mg total) by mouth 3 (three) times daily as needed for cough. 05/26/22   Zenia Resides, MD  Calcium-Vitamin D (CALTRATE 600 PLUS-VIT D PO) Take 1 tablet by mouth daily.    [provider]  ezetimibe (ZETIA) 10 MG tablet Take 1 tablet (10 mg total) by mouth daily. 10/17/22   Meriam Sprague, MD  hydrochlorothiazide (HYDRODIURIL) 25 MG tablet TAKE 1 TABLET BY MOUTH EVERY DAY 02/14/23   Nelwyn Salisbury, MD  loratadine (CLARITIN) 10 MG tablet Take 1 tablet (10 mg total) by mouth daily. 01/31/21   Nelwyn Salisbury, MD  losartan (COZAAR) 50 MG tablet TAKE 1 TABLET (50 MG TOTAL) BY MOUTH DAILY.  SCHEDULE ANNUAL APPT FOR FUTURE REFILLS 02/17/23   Nelwyn Salisbury, MD  meclizine (ANTIVERT) 25 MG tablet Take 1 tablet (25 mg total) by mouth every 4 (four) hours as needed for dizziness. 07/03/21   Nelwyn Salisbury, MD  Multiple Vitamins-Minerals (CENTRUM SILVER PO) Take 1 tablet by mouth daily.    [provider]  omeprazole (PRILOSEC) 40 MG capsule Take 1 capsule (40 mg total) by mouth daily. 11/06/20   Nelwyn Salisbury, MD  ondansetron (ZOFRAN-ODT) 4 MG disintegrating tablet Take 1 tablet (4 mg total) by mouth every 8 (eight) hours as needed for nausea or vomiting. 05/26/22   Zenia Resides, MD  potassium chloride SA (KLOR-CON  M20) 20 MEQ tablet TAKE 1 TABLET BY MOUTH EVERY DAY 01/13/23   Nelwyn Salisbury, MD  rosuvastatin (CRESTOR) 5 MG tablet Take 1 tablet (5 mg total) by mouth daily. 02/17/23   Meriam Sprague, MD  vitamin C (ASCORBIC ACID) 500 MG tablet Take 500 mg by mouth daily.    [provider]    Family History Family History  Family history unknown: Yes    Social History Social History   Tobacco Use   Smoking status: Never   Smokeless tobacco: Never  Vaping Use   Vaping Use: Never used  Substance Use Topics   Alcohol use: Not Currently    Comment: rare   Drug use: No     Allergies   Iodine and Latex   Review of Systems Review of Systems  Constitutional:  Negative for activity change, appetite change, fatigue and fever.  Gastrointestinal:  Negative for abdominal pain, diarrhea, nausea and vomiting.  Musculoskeletal:  Negative for arthralgias and myalgias.  Skin:  Positive for wound. Negative for color change.     Physical Exam Triage Vital Signs ED Triage Vitals  Enc Vitals Group     BP 03/10/23 0821 (!) 145/83     Pulse Rate 03/10/23 0821 (!) 59     Resp 03/10/23 0821 18     Temp 03/10/23 0821 98.3 F (36.8 C)     Temp Source 03/10/23 0821 Oral     SpO2 03/10/23 0821 96 %     Weight 03/10/23 0822 165 lb 5.5 oz (75 kg)     Height 03/10/23 0822 5\' 5"  (1.651 m)     Head Circumference --      Peak Flow --      Pain Score 03/10/23 0822 3     Pain Loc --      Pain Edu? --      Excl. in GC? --    No data found.  Updated Vital Signs BP (!) 145/83 (BP Location: Right Arm)   Pulse (!) 59   Temp 98.3 F (36.8 C) (Oral)   Resp 18   Ht 5\' 5"  (1.651 m)   Wt 165 lb 5.5 oz (75 kg)   SpO2 96%   BMI 27.51 kg/m   Visual Acuity Right Eye Distance:   Left Eye Distance:   Bilateral Distance:    Right Eye Near:   Left Eye Near:    Bilateral Near:     Physical Exam Vitals reviewed.  Constitutional:      General: She is awake. She is not in acute distress.     Appearance: Normal appearance. She is well-developed. She is not ill-appearing.     Comments: Very pleasant female appears stated age in no acute distress sitting comfortably in exam room  HENT:  Head: Normocephalic and atraumatic.  Cardiovascular:     Rate and Rhythm: Normal rate and regular rhythm.     Heart sounds: Normal heart sounds, S1 normal and S2 normal. No murmur heard.    Comments: Capillary refill within 2 seconds left thumb Pulmonary:     Effort: Pulmonary effort is normal.     Breath sounds: Normal breath sounds. No wheezing, rhonchi or rales.     Comments: Clear to auscultation bilaterally Abdominal:     Palpations: Abdomen is soft.     Tenderness: There is no abdominal tenderness.  Musculoskeletal:     Left hand: No swelling, deformity or tenderness. Normal range of motion. There is no disruption of two-point discrimination. Normal capillary refill.     Comments: Left thumb: Puncture wound noted medial nail fold with surrounding erythema.  No active bleeding or drainage noted.  Normal active range of motion of fingers.  Finger neurovascularly intact.  Psychiatric:        Behavior: Behavior is cooperative.      UC Treatments / Results  Labs (all labs ordered are listed, but only abnormal results are displayed) Labs Reviewed - No data to display  EKG   Radiology No results found.  Procedures Procedures (including critical care time)  Medications Ordered in UC Medications - No data to display  Initial Impression / Assessment and Plan / UC Course  I have reviewed the triage vital signs and the nursing notes.  Pertinent labs & imaging results that were available during my care of the patient were reviewed by me and considered in my medical decision making (see chart for details).     Patient is well-appearing, afebrile, nontoxic, nontachycardic.  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  We discussed the importance of wound care and she was encouraged to wash this with soap and water and apply Bactroban ointment with dressing changes.  Given associated animal bite will cover with Augmentin.  No indication for dose adjustment based on metabolic panel from 06/25/2022 with creatinine of 0.89 and calculated creatinine clearance of 64.53 mL/min.  She is up-to-date on tetanus that was last given 08/02/2022.  We discussed that if she has any worsening or changing symptoms including increasing redness, pain, swelling, systemic symptoms including fever, nausea, vomiting she needs to be seen immediately.  Strict return precautions given to which she expressed understanding.  Final Clinical Impressions(s) / UC Diagnoses   Final diagnoses:  Puncture wound of left thumb, initial encounter  Cat bite, initial encounter     Discharge Instructions      Keep this area clean with soap and water.  Apply mupirocin ointment daily.  Take Augmentin twice daily for 7 days.  Take this with food as it can upset your stomach.  Your tetanus is up-to-date and was last given 08/02/2022 as we do not need to repeat this today.  Monitor the area of redness and pain.  If this continues to spread or if you develop any other symptoms including increasing pain, swelling, fever, nausea, vomiting, body aches you need to be seen immediately.     ED Prescriptions     Medication Sig Dispense Auth. Provider   mupirocin ointment (BACTROBAN) 2 % Apply 1 Application topically daily. 22 g Melodie Ashworth K, PA-C   amoxicillin-clavulanate (AUGMENTIN) 875-125 MG tablet Take 1 tablet by mouth every 12 (twelve) hours. 14 tablet Gurshan Settlemire, Noberto Retort, PA-C  PDMP not reviewed this encounter.   Jeani Hawking, PA-C 03/10/23 [phone]

## 2023-03-10 NOTE — Discharge Instructions (Signed)
Keep this area clean with soap and water.  Apply mupirocin ointment daily.  Take Augmentin twice daily for 7 days.  Take this with food as it can upset your stomach.  Your tetanus is up-to-date and was last given 08/02/2022 as we do not need to repeat this today.  Monitor the area of redness and pain.  If this continues to spread or if you develop any other symptoms including increasing pain, swelling, fever, nausea, vomiting, body aches you need to be seen immediately.

## 2023-03-19 ENCOUNTER — Telehealth: Payer: Self-pay | Admitting: Family Medicine

## 2023-03-19 NOTE — Telephone Encounter (Signed)
Pt would like to know why MD is sending her to see Dr. Alden Hipp, because she already saw an eye doctor?  Please advise.

## 2023-03-21 NOTE — Telephone Encounter (Signed)
I have no idea what she is talking about. I have not seen her since last August

## 2023-03-26 NOTE — Telephone Encounter (Signed)
Attempted to call pt no option to leave a message on pt mobile number. Will try later

## 2023-03-27 ENCOUNTER — Other Ambulatory Visit: Payer: Self-pay | Admitting: Family Medicine

## 2023-03-27 NOTE — Telephone Encounter (Signed)
Attempted to contact pt pt phone kept ringing with no option to leave a message.

## 2023-03-28 ENCOUNTER — Telehealth: Payer: Self-pay | Admitting: Family Medicine

## 2023-03-28 NOTE — Telephone Encounter (Signed)
Misplaced her amLODipine (NORVASC) 10 MG tablet requesting refills or wants to know if she can do without it

## 2023-04-03 NOTE — Telephone Encounter (Signed)
Pt has refills at the pharmacy. Left detailed message for pt to call the office with any questions

## 2023-04-25 DIAGNOSIS — H5213 Myopia, bilateral: Secondary | ICD-10-CM | POA: Diagnosis not present

## 2023-04-25 DIAGNOSIS — H04123 Dry eye syndrome of bilateral lacrimal glands: Secondary | ICD-10-CM | POA: Diagnosis not present

## 2023-04-25 DIAGNOSIS — H02831 Dermatochalasis of right upper eyelid: Secondary | ICD-10-CM | POA: Diagnosis not present

## 2023-04-25 DIAGNOSIS — H02834 Dermatochalasis of left upper eyelid: Secondary | ICD-10-CM | POA: Diagnosis not present

## 2023-04-25 DIAGNOSIS — H2513 Age-related nuclear cataract, bilateral: Secondary | ICD-10-CM | POA: Diagnosis not present

## 2023-04-25 DIAGNOSIS — H40013 Open angle with borderline findings, low risk, bilateral: Secondary | ICD-10-CM | POA: Diagnosis not present

## 2023-04-25 DIAGNOSIS — H35033 Hypertensive retinopathy, bilateral: Secondary | ICD-10-CM | POA: Diagnosis not present

## 2023-04-28 ENCOUNTER — Other Ambulatory Visit: Payer: Self-pay

## 2023-04-28 MED ORDER — EZETIMIBE 10 MG PO TABS: 10.0000 mg | ORAL_TABLET | Freq: Every day | ORAL | 0 refills | Status: DC

## 2023-04-30 ENCOUNTER — Ambulatory Visit (HOSPITAL_COMMUNITY)
Admission: EM | Admit: 2023-04-30 | Discharge: 2023-04-30 | Disposition: A | Discharge status: Home / Self Care | Payer: Medicare Other | Attending: Emergency Medicine | Admitting: Emergency Medicine

## 2023-04-30 ENCOUNTER — Encounter (HOSPITAL_COMMUNITY): Payer: Self-pay

## 2023-04-30 DIAGNOSIS — Z8679 Personal history of other diseases of the circulatory system: Secondary | ICD-10-CM

## 2023-04-30 DIAGNOSIS — W57XXXA Bitten or stung by nonvenomous insect and other nonvenomous arthropods, initial encounter: Secondary | ICD-10-CM

## 2023-04-30 MED ORDER — TRIAMCINOLONE ACETONIDE 0.5 % EX OINT: 1.0000 | TOPICAL_OINTMENT | Freq: Two times a day (BID) | CUTANEOUS | 0 refills | Status: AC

## 2023-04-30 NOTE — ED Provider Notes (Signed)
[facility]    CSN: [id number] Arrival date & time: 04/30/23  0802      History   Chief Complaint Chief Complaint  Patient presents with   Rash    HPI Anne [name] is a 77 y.o. female.   77 year old female, An Lannan, presents to urgent care for evaluation of "rash". Pt has several insect bites noted, no treatment tried, states she was outside at friend's house on porch when noticed bites. No respiratory issues,no hives, no lip swelling or throat issues.  The history is provided by the patient. No language interpreter was used.    Past Medical History:  Diagnosis Date   Hyperlipidemia    Hypertension    Osteopenia    per DEXA on 07-19-09   Stroke (HCC) 09-2007   has mild left leg weakness, saw Dr. Vickey Huger    Patient Active Problem List   Diagnosis Date Noted   Bite, insect 04/30/2023   COVID-19 virus infection 05/27/2022   GERD (gastroesophageal reflux disease) 08/29/2021   Bradycardia, unspecified 01/02/2018   Mallet deformity of fourth finger, right 02/02/2014   ALLERGIC RHINITIS 05/08/2010   DIZZINESS 02/01/2010   LIPOMA 08/11/2009   OSTEOPENIA 08/11/2009   SUPRAVENTRICULAR PREMATURE BEATS 07/28/2008   FATIGUE 06/16/2008   MYOSITIS 05/12/2008   Hyperlipidemia 11/24/2007   Essential hypertension 11/24/2007   History of hypertension 11/24/2007    History reviewed. No pertinent surgical history.  OB History   No obstetric history on file.      Home Medications    Prior to Admission medications   Medication Sig Start Date End Date Taking? Authorizing Provider  acetaminophen (TYLENOL) 325 MG tablet Take 650 mg by mouth every 6 (six) hours as needed for mild pain.   Yes [provider]  amLODipine (NORVASC) 10 MG tablet TAKE 1 TABLET (10 MG TOTAL) BY MOUTH DAILY. *APPOINTMENT REQUIRED FOR FUTURE REFILLS 02/17/23  Yes Nelwyn Salisbury, MD  aspirin 81 MG tablet Take 81 mg by mouth daily.   Yes [provider]   Calcium-Vitamin D (CALTRATE 600 PLUS-VIT D PO) Take 1 tablet by mouth daily.   Yes [provider]  ezetimibe (ZETIA) 10 MG tablet Take 1 tablet (10 mg total) by mouth daily. 04/28/23  Yes Meriam Sprague, MD  hydrochlorothiazide (HYDRODIURIL) 25 MG tablet TAKE 1 TABLET BY MOUTH EVERY DAY 02/14/23  Yes Nelwyn Salisbury, MD  loratadine (CLARITIN) 10 MG tablet Take 1 tablet (10 mg total) by mouth daily. 01/31/21  Yes Nelwyn Salisbury, MD  losartan (COZAAR) 50 MG tablet TAKE 1 TABLET (50 MG TOTAL) BY MOUTH DAILY. SCHEDULE ANNUAL APPT FOR FUTURE REFILLS 02/17/23  Yes Nelwyn Salisbury, MD  meclizine (ANTIVERT) 25 MG tablet Take 1 tablet (25 mg total) by mouth every 4 (four) hours as needed for dizziness. 07/03/21  Yes Nelwyn Salisbury, MD  Multiple Vitamins-Minerals (CENTRUM SILVER PO) Take 1 tablet by mouth daily.   Yes [provider]  omeprazole (PRILOSEC) 40 MG capsule Take 1 capsule (40 mg total) by mouth daily. 11/06/20  Yes Nelwyn Salisbury, MD  ondansetron (ZOFRAN-ODT) 4 MG disintegrating tablet Take 1 tablet (4 mg total) by mouth every 8 (eight) hours as needed for nausea or vomiting. 05/26/22  Yes Zenia Resides, MD  potassium chloride SA (KLOR-CON M20) 20 MEQ tablet TAKE 1 TABLET BY MOUTH EVERY DAY 01/13/23  Yes Nelwyn Salisbury, MD  rosuvastatin (CRESTOR) 5 MG tablet Take 1 tablet (5 mg  total) by mouth daily. 02/17/23  Yes Meriam Sprague, MD  triamcinolone ointment (KENALOG) 0.5 % Apply 1 Application topically 2 (two) times daily for 7 days. To affected areas,avoid face 04/30/23 05/07/23 Yes Rahma Meller, Para March, NP  vitamin C (ASCORBIC ACID) 500 MG tablet Take 500 mg by mouth daily.   Yes [provider]  amoxicillin-clavulanate (AUGMENTIN) 875-125 MG tablet Take 1 tablet by mouth every 12 (twelve) hours. 03/10/23   Raspet, Noberto Retort, PA-C  benzonatate (TESSALON) 100 MG capsule Take 1 capsule (100 mg total) by mouth 3 (three) times daily as needed for cough. 05/26/22   Zenia Resides, MD  mupirocin ointment (BACTROBAN) 2 % Apply 1 Application topically daily. 03/10/23   Raspet, Noberto Retort, PA-C    Family History Family History  Family history unknown: Yes    Social History Social History   Tobacco Use   Smoking status: Never   Smokeless tobacco: Never  Vaping Use   Vaping status: Never Used  Substance Use Topics   Alcohol use: Not Currently    Comment: rare   Drug use: No     Allergies   Iodine, Latex, and Shellfish allergy   Review of Systems Review of Systems  Skin:  Positive for color change and rash.  All other systems reviewed and are negative.    Physical Exam Triage Vital Signs ED Triage Vitals  Encounter Vitals Group     BP      Systolic BP Percentile      Diastolic BP Percentile      Pulse      Resp      Temp      Temp src      SpO2      Weight      Height      Head Circumference      Peak Flow      Pain Score      Pain Loc      Pain Education      Exclude from Growth Chart    No data found.  Updated Vital Signs BP (!) 151/79 (BP Location: Left Arm)   Pulse 64   Temp (!) 97.5 F (36.4 C) (Oral)   Resp 18   SpO2 95%   Visual Acuity Right Eye Distance:   Left Eye Distance:   Bilateral Distance:    Right Eye Near:   Left Eye Near:    Bilateral Near:     Physical Exam Vitals and nursing note reviewed.  Constitutional:      Appearance: Normal appearance. She is well-developed and well-groomed.  Cardiovascular:     Rate and Rhythm: Normal rate.  Pulmonary:     Effort: Pulmonary effort is normal.     Comments: No respiratory distress Skin:    General: Skin is warm.     Capillary Refill: Capillary refill takes less than 2 seconds.     Findings: Erythema and rash present. Rash is macular and papular.     Comments: Pt has mild erythematous areas to popliteal area bilaterally, waistline trunk(front/back) c/w insect bites, no drainage,no fluctuance  No welts, no urticaria  Neurological:     General: No  focal deficit present.     Mental Status: She is alert and oriented to person, place, and time.     GCS: GCS eye subscore is 4. GCS verbal subscore is 5. GCS motor subscore is 6.  Psychiatric:        Attention and Perception: Attention normal.  Mood and Affect: Mood normal.        Speech: Speech normal.        Behavior: Behavior normal. Behavior is cooperative.      UC Treatments / Results  Labs (all labs ordered are listed, but only abnormal results are displayed) Labs Reviewed - No data to display  EKG   Radiology No results found.  Procedures Procedures (including critical care time)  Medications Ordered in UC Medications - No data to display  Initial Impression / Assessment and Plan / UC Course  I have reviewed the triage vital signs and the nursing notes.  Pertinent labs & imaging results that were available during my care of the patient were reviewed by me and considered in my medical decision making (see chart for details).     Ddx: Insect bites, rash,hx of hypertension Final Clinical Impressions(s) / UC Diagnoses   Final diagnoses:  Insect bite, unspecified site, initial encounter  History of hypertension     Discharge Instructions      Make sure to avoid early morning,late evening as insect activity is at peak. Use bug spray when outside, long sleeves/pants, apply triamcinolone cream on insect bites as prescribed,take zyrtec over the counter med as label directed. Wash hands frequently, trim nails,avoid scratching. If you develop fever, streaking, worsening issues please follow up immediately with PCP or go to ER.      ED Prescriptions     Medication Sig Dispense Auth. Provider   triamcinolone ointment (KENALOG) 0.5 % Apply 1 Application topically 2 (two) times daily for 7 days. To affected areas,avoid face 15 g Keeghan Mcintire, Para March, NP      PDMP not reviewed this encounter.   Clancy Gourd, NP 04/30/23 1132

## 2023-04-30 NOTE — ED Triage Notes (Signed)
Monday pt noticed red whelps on back of her calf and back that are itchy, pt states she feels fatigued since Monday.

## 2023-04-30 NOTE — Discharge Instructions (Signed)
Make sure to avoid early morning,late evening as insect activity is at peak. Use bug spray when outside, long sleeves/pants, apply triamcinolone cream on insect bites as prescribed,take zyrtec over the counter med as label directed. Wash hands frequently, trim nails,avoid scratching. If you develop fever, streaking, worsening issues please follow up immediately with PCP or go to ER.

## 2023-05-22 ENCOUNTER — Ambulatory Visit: Payer: Medicare Other | Admitting: Family Medicine

## 2023-05-24 ENCOUNTER — Other Ambulatory Visit: Payer: Self-pay | Admitting: Family Medicine

## 2023-05-26 ENCOUNTER — Other Ambulatory Visit: Payer: Self-pay

## 2023-05-26 MED ORDER — EZETIMIBE 10 MG PO TABS: 10.0000 mg | ORAL_TABLET | Freq: Every day | ORAL | 1 refills | Status: DC

## 2023-06-23 ENCOUNTER — Telehealth: Payer: Self-pay | Admitting: Family Medicine

## 2023-06-23 NOTE — Telephone Encounter (Addendum)
Prescription Request  06/23/2023  LOV: 08/02/22 = Acute Visit with Dr. Caryl Never LVV:  09/11/22 = Acute Visit with Dr. Salomon Fick  What is the name of the medication or equipment?  Losartan (COZAAR) 50 MG tablet Pt states she is completely out of this Rx, for about 2 months now, and pharmacy is saying she has to wait until 07/11/23  Rosuvastatin (CRESTOR) 5 MG tablet Pt states she is completely out of this Rx. (Ordered on: 05/06/24Authorizing provider:Pemberton, Kathlynn Grate, MD)  Have you contacted your pharmacy to request a refill? Yes   Which pharmacy would you like this sent to?   [id number] [place], [place] - [address] [place] [address] Phone: [phone] Fax: [phone]    Patient notified that their request is being sent to the clinical staff for review and that they should receive a response within 2 business days.   Please advise at Mobile [phone] (mobile)

## 2023-06-24 ENCOUNTER — Other Ambulatory Visit: Payer: Self-pay

## 2023-06-24 MED ORDER — LOSARTAN POTASSIUM 50 MG PO TABS: 50.0000 mg | ORAL_TABLET | Freq: Every day | ORAL | 0 refills | Status: DC

## 2023-06-26 NOTE — Telephone Encounter (Signed)
Done

## 2023-06-27 ENCOUNTER — Telehealth: Payer: Self-pay | Admitting: Internal Medicine

## 2023-06-27 MED ORDER — ROSUVASTATIN CALCIUM 5 MG PO TABS: 5.0000 mg | ORAL_TABLET | Freq: Every day | ORAL | 0 refills | Status: DC

## 2023-06-27 MED ORDER — EZETIMIBE 10 MG PO TABS: 10.0000 mg | ORAL_TABLET | Freq: Every day | ORAL | 0 refills | Status: DC

## 2023-06-27 NOTE — Telephone Encounter (Signed)
*  STAT* If patient is at the pharmacy, call can be transferred to refill team.   1. Which medications need to be refilled? (please list name of each medication and dose if known) ezetimibe (ZETIA) 10 MG tablet   rosuvastatin (CRESTOR) 5 MG tablet   2. Which pharmacy/location (including street and city if local pharmacy) is medication to be sent to?  [id number] - [place], [place] - [address]    3. Do they need a 30 day or 90 day supply? 90

## 2023-06-27 NOTE — Telephone Encounter (Signed)
Pt's medications were sent to pt's pharmacy as requested. Confirmation received.

## 2023-07-15 ENCOUNTER — Ambulatory Visit (INDEPENDENT_AMBULATORY_CARE_PROVIDER_SITE_OTHER): Payer: Medicare Other

## 2023-07-15 ENCOUNTER — Ambulatory Visit (HOSPITAL_COMMUNITY)
Admission: EM | Admit: 2023-07-15 | Discharge: 2023-07-15 | Disposition: A | Discharge status: Home / Self Care | Payer: Medicare Other | Attending: Family Medicine | Admitting: Family Medicine

## 2023-07-15 ENCOUNTER — Encounter (HOSPITAL_COMMUNITY): Payer: Self-pay | Admitting: Emergency Medicine

## 2023-07-15 DIAGNOSIS — M25561 Pain in right knee: Secondary | ICD-10-CM

## 2023-07-15 DIAGNOSIS — M1711 Unilateral primary osteoarthritis, right knee: Secondary | ICD-10-CM | POA: Diagnosis not present

## 2023-07-15 DIAGNOSIS — M25562 Pain in left knee: Secondary | ICD-10-CM | POA: Diagnosis not present

## 2023-07-15 NOTE — ED Triage Notes (Signed)
Pt fell in parking lot and landed on both knee. Pt states right knee hurts worse than left but both feel "strange"

## 2023-07-15 NOTE — Discharge Instructions (Signed)
If not allergic, you may use over the counter ibuprofen or acetaminophen as needed. ° °

## 2023-07-16 NOTE — ED Provider Notes (Signed)
[facility]   [id number] 07/15/23 Arrival Time: 1420  ASSESSMENT & PLAN:  1. Acute pain of left knee   2. Acute pain of right knee    I have personally viewed and independently interpreted the imaging studies ordered this visit. Bilat knees: no fx appreciated.  Prefers OTC Aleve BID with food. WBAT.   Orders Placed This Encounter  Procedures   DG Knee Complete 4 Views Left   DG Knee Complete 4 Views Right   Recommend:  Follow-up Information     Nelwyn Salisbury, MD.   Specialty: Family Medicine Why: If worsening or failing to improve as anticipated. Contact information: [address] [name] [address] [phone]                 Reviewed expectations re: course of current medical issues. Questions answered. Outlined signs and symptoms indicating need for more acute intervention. Patient verbalized understanding. After Visit Summary given.  SUBJECTIVE: History from: patient. Anne [name] is a 77 y.o. female who reports bilat knee pain s/p fall today. Is ambulatory. No tx PTA.   History reviewed. No pertinent surgical history.    OBJECTIVE:  Vitals:   07/15/23 1458  BP: (!) 146/80  Pulse: 66  Resp: 16  Temp: 97.7 F (36.5 C)  TempSrc: Oral  SpO2: 96%    General appearance: alert; no distress HEENT: [place]; AT Neck: supple with FROM Resp: unlabored respirations Extremities: Bilat knees: warm with well perfused appearance; no gross defor; TTP over ant knees; FROM CV: brisk extremity capillary refill of bilateral LE; 2+ DP pulse of bilateral LE. Skin: warm and dry; no visible rashes Neurologic: gait normal; normal sensation and strength of bilateral LE Psychological: alert and cooperative; normal mood and affect  Imaging: DG Knee Complete 4 Views Right  Result Date: 07/15/2023 CLINICAL DATA:  Pain after fall onto knees. EXAM: RIGHT KNEE - COMPLETE 4+ VIEW COMPARISON:  Right knee radiographs 03/27/2021 FINDINGS: Moderate  to severe medial compartment joint space narrowing with mild subchondral sclerosis and peripheral osteophytosis, similar to prior. The lateral compartment joint space is maintained. No joint effusion. No acute fracture or dislocation. Mild-to-moderate atherosclerotic calcifications. IMPRESSION: Moderate to severe medial compartment osteoarthritis, similar to prior. Electronically Signed   By: Neita Garnet M.D.   On: 07/15/2023 16:58   DG Knee Complete 4 Views Left  Result Date: 07/15/2023 CLINICAL DATA:  Pain after fall onto knees. EXAM: LEFT KNEE - COMPLETE 4+ VIEW COMPARISON:  None Available. FINDINGS: Minimal medial compartment joint space narrowing. No joint effusion. No acute fracture or dislocation. Mild-to-moderate atherosclerotic calcifications. IMPRESSION: Minimal medial compartment joint space narrowing. No acute fracture. Electronically Signed   By: Neita Garnet M.D.   On: 07/15/2023 16:56      Allergies  Allergen Reactions   Iodine Itching and Other (See Comments)    "lump in tongue when eats seafood"   Latex Itching    GLOVES   Shellfish Allergy Swelling    Past Medical History:  Diagnosis Date   Hyperlipidemia    Hypertension    Osteopenia    per DEXA on 07-19-09   Stroke (HCC) 09-2007   has mild left leg weakness, saw Dr. Vickey Huger   Social History   Socioeconomic History   Marital status: Single    Spouse name: Not on file   Number of children: Not on file   Years of education: Not on file   Highest education level: Not on file  Occupational History  Occupation: part Printmaker  Tobacco Use   Smoking status: Never   Smokeless tobacco: Never  Vaping Use   Vaping status: Never Used  Substance and Sexual Activity   Alcohol use: Not Currently    Comment: rare   Drug use: No   Sexual activity: Not Currently  Other Topics Concern   Not on file  Social History Narrative   Not on file   Social Determinants of Health   Financial Resource Strain: Low Risk   (10/21/2022)   Overall Financial Resource Strain (CARDIA)    Difficulty of Paying Living Expenses: Not hard at all  Food Insecurity: No Food Insecurity (10/21/2022)   Hunger Vital Sign    Worried About Running Out of Food in the Last Year: Never true    Ran Out of Food in the Last Year: Never true  Transportation Needs: No Transportation Needs (10/21/2022)   PRAPARE - Administrator, Civil Service (Medical): No    Lack of Transportation (Non-Medical): No  Physical Activity: Inactive (10/21/2022)   Exercise Vital Sign    Days of Exercise per Week: 0 days    Minutes of Exercise per Session: 0 min  Stress: No Stress Concern Present (10/21/2022)   [employer] of Occupational Health - Occupational Stress Questionnaire    Feeling of Stress : Not at all  Social Connections: Moderately Integrated (10/21/2022)   Social Connection and Isolation Panel [NHANES]    Frequency of Communication with Friends and Family: More than three times a week    Frequency of Social Gatherings with Friends and Family: More than three times a week    Attends Religious Services: More than 4 times per year    Active Member of [employer] or Organizations: Yes    Attends Engineer, structural: More than 4 times per year    Marital Status: Never married   Family History  Family history unknown: Yes   History reviewed. No pertinent surgical history.     Mardella Layman, MD 07/16/23 1014

## 2023-07-22 ENCOUNTER — Other Ambulatory Visit: Payer: Self-pay

## 2023-07-22 ENCOUNTER — Other Ambulatory Visit: Payer: Self-pay | Admitting: Family Medicine

## 2023-07-22 MED ORDER — COMIRNATY 30 MCG/0.3ML IM SUSY
PREFILLED_SYRINGE | INTRAMUSCULAR | 0 refills | Status: DC
  Filled 2023-07-22: qty 0.3, 1d supply, fill #0

## 2023-07-22 MED ORDER — FLUAD 0.5 ML IM SUSY
PREFILLED_SYRINGE | INTRAMUSCULAR | 0 refills | Status: AC
  Filled 2023-07-22: qty 0.5, 1d supply, fill #0

## 2023-08-01 ENCOUNTER — Telehealth: Payer: Self-pay | Admitting: Family Medicine

## 2023-08-01 ENCOUNTER — Encounter: Payer: Self-pay | Admitting: Family Medicine

## 2023-08-01 ENCOUNTER — Ambulatory Visit (INDEPENDENT_AMBULATORY_CARE_PROVIDER_SITE_OTHER): Payer: Medicare Other | Admitting: Family Medicine

## 2023-08-01 VITALS — BP 142/80 | HR 61 | Temp 98.0°F | Wt 166.0 lb

## 2023-08-01 DIAGNOSIS — N390 Urinary tract infection, site not specified: Secondary | ICD-10-CM | POA: Diagnosis not present

## 2023-08-01 DIAGNOSIS — L210 Seborrhea capitis: Secondary | ICD-10-CM | POA: Diagnosis not present

## 2023-08-01 LAB — POC URINALSYSI DIPSTICK (AUTOMATED)
Bilirubin, UA: NEGATIVE
Blood, UA: NEGATIVE
Glucose, UA: NEGATIVE
Ketones, UA: NEGATIVE
Leukocytes, UA: NEGATIVE
Nitrite, UA: NEGATIVE
Protein, UA: NEGATIVE
Spec Grav, UA: 1.01 (ref 1.010–1.025)
Urobilinogen, UA: 0.2 U/dL
pH, UA: 7 (ref 5.0–8.0)

## 2023-08-01 MED ORDER — CIPROFLOXACIN HCL 500 MG PO TABS: 500.0000 mg | ORAL_TABLET | Freq: Two times a day (BID) | ORAL | 0 refills | Status: DC

## 2023-08-01 MED ORDER — KETOCONAZOLE 2 % EX SHAM: 1.0000 | MEDICATED_SHAMPOO | CUTANEOUS | 5 refills | Status: AC

## 2023-08-01 NOTE — Progress Notes (Signed)
   Subjective:    Patient ID: Anne [name], female    DOB: 1946-01-17, 77 y.o.   MRN: [id number]  HPI Here for 2 issues. First about 2 weeks ago she began to have urgency to urinate with burning. No fever or back pain. Drinking wtaer. Also several months ago she noticed areas of her scalp that began to itch and to flake. She has tried [employer] and Shoulders shampoos with no results.    Review of Systems  Constitutional: Negative.   Respiratory: Negative.    Cardiovascular: Negative.   Gastrointestinal: Negative.   Genitourinary:  Positive for dysuria, frequency and urgency. Negative for flank pain and hematuria.       Objective:   Physical Exam Constitutional:      Appearance: Normal appearance. She is not ill-appearing.  Cardiovascular:     Rate and Rhythm: Normal rate and regular rhythm.     Pulses: Normal pulses.     Heart sounds: Normal heart sounds.  Pulmonary:     Effort: Pulmonary effort is normal.     Breath sounds: Normal breath sounds.  Abdominal:     Tenderness: There is no right CVA tenderness or left CVA tenderness.  Skin:    Comments: Her occipital scalp has areas with a lot of flaking   Neurological:     Mental Status: She is alert.           Assessment & Plan:  She has a UTI, so we will treat this with 7 days of Cipro. Culture the sample. She also has seborrhea, and we will treat this with Nizoral 2% shampoo. Gershon Crane, MD

## 2023-08-01 NOTE — Telephone Encounter (Signed)
Pt has a CPE coming up in Nov, pt is aware that she will get her labs done then. Attempted to call pt for advise but mailbox is full and no option to leave a message.

## 2023-08-01 NOTE — Addendum Note (Signed)
Addended by: Carola Rhine on: 08/01/2023 09:29 AM   Modules accepted: Orders

## 2023-08-01 NOTE — Telephone Encounter (Signed)
Pt states she thought provider had told her she would need to have her thyroid levels checked, however she can't remember for certain. No orders placed that I can see. Pt is requesting a call back to discuss further.

## 2023-08-02 LAB — URINE CULTURE
MICRO NUMBER:: 15613854
Result:: NO GROWTH
SPECIMEN QUALITY:: ADEQUATE

## 2023-08-28 ENCOUNTER — Encounter: Payer: Medicare Other | Admitting: Family Medicine

## 2023-09-06 ENCOUNTER — Encounter: Payer: Self-pay | Admitting: Cardiovascular Disease

## 2023-09-06 NOTE — Progress Notes (Unsigned)
  Cardiology Office Note:  .   Date:  09/08/2023  ID:  Anne [name], DOB March 30, 1946, MRN [id number] PCP: Nelwyn Salisbury, MD  [facility] Providers Cardiologist:  None    History of Present Illness: .    Nov. 25, 2024   Anne [name] is a 77 y.o. female with hx of pre-diabetes, mild AS, stroke,  elevated CAC  She is a previous patient of Dr, Katrinka Blazing I am meeting her for the firs time today   Has not taken her BP meds ( did not take her meds )  No CP , no dyspnea  She has normal left ventricular systolic function with EF of 60 to 65%.  She has grade 1 diastolic dysfunction.  She has moderately elevated pulmonary pressures with an estimated PA pressure of 48 mmHg. There is mild mitral stenosis. Mild mitral regurgitation. Mild aortic stenosis with a mean aortic valve gradient of 17 mmHg.    ROS:   Studies Reviewed: .         Risk Assessment/Calculations:             Physical Exam:   VS:  BP 130/80   Pulse (!) 57   Ht 5\' 5"  (1.651 m)   Wt 166 lb 6.4 oz (75.5 kg)   SpO2 96%   BMI 27.69 kg/m    Wt Readings from Last 3 Encounters:  09/08/23 166 lb 6.4 oz (75.5 kg)  08/01/23 166 lb (75.3 kg)  03/10/23 165 lb 5.5 oz (75 kg)    GEN: Well nourished, well developed in no acute distress NECK: No JVD; No carotid bruits CARDIAC: RRR,  soft systolic murmur  RESPIRATORY:  Clear to auscultation without rales, wheezing or rhonchi  ABDOMEN: Soft, non-tender, non-distended EXTREMITIES:  No edema; No deformity   ECG:    EKG Interpretation Date/Time:  Monday September 08 2023 15:11:22 EST Ventricular Rate:  57 PR Interval:  170 QRS Duration:  112 QT Interval:  444 QTC Calculation: 432 R Axis:   -38  Text Interpretation: Sinus bradycardia with marked sinus arrhythmia Left axis deviation Moderate voltage criteria for LVH, may be normal variant ( R in aVL , Cornell product ) When compared with ECG of 25-Jun-2022 09:47, No significant change was found Confirmed by  Kristeen Miss ([id number]) on 09/08/2023 4:48:26 PM    ASSESSMENT AND PLAN: .    Aortic stenosis:  mild , no symptoms,  will get an echo in a year.  2.   DM:   managed by her primary MD   3.    Coronary artery calcification:     cont meds . LDL is 76.   Her goal is LDL< 70           Dispo: 1 year with Dr. Izora Ribas  Signed,    Kristeen Miss, MD  09/08/2023 5:01 PM    [facility] [address] [place],  [address] [place], [place]  [address] Phone: [phone]; Fax: [phone]

## 2023-09-08 ENCOUNTER — Ambulatory Visit: Payer: Medicare Other | Attending: Cardiovascular Disease | Admitting: Cardiovascular Disease

## 2023-09-08 ENCOUNTER — Encounter: Payer: Self-pay | Admitting: Cardiovascular Disease

## 2023-09-08 VITALS — BP 130/80 | HR 57 | Ht 65.0 in | Wt 166.4 lb

## 2023-09-08 DIAGNOSIS — R0989 Other specified symptoms and signs involving the circulatory and respiratory systems: Secondary | ICD-10-CM

## 2023-09-08 DIAGNOSIS — I350 Nonrheumatic aortic (valve) stenosis: Secondary | ICD-10-CM | POA: Diagnosis not present

## 2023-09-08 DIAGNOSIS — E782 Mixed hyperlipidemia: Secondary | ICD-10-CM

## 2023-09-08 MED ORDER — AMLODIPINE BESYLATE 10 MG PO TABS: 10.0000 mg | ORAL_TABLET | Freq: Every day | ORAL | 3 refills | Status: DC

## 2023-09-08 MED ORDER — LOSARTAN POTASSIUM 50 MG PO TABS: 50.0000 mg | ORAL_TABLET | Freq: Every day | ORAL | 3 refills | Status: DC

## 2023-09-08 NOTE — Patient Instructions (Signed)
Lab Work: Lipids, ALT, BMET today If you have labs (blood work) drawn today and your tests are completely normal, you will receive your results only by: MyChart Message (if you have MyChart) OR A paper copy in the mail If you have any lab test that is abnormal or we need to change your treatment, we will call you to review the results.   Testing/Procedures: Carotid Ultrasound Your physician has requested that you have a carotid duplex. This test is an ultrasound of the carotid arteries in your neck. It looks at blood flow through these arteries that supply the brain with blood. Allow one hour for this exam. There are no restrictions or special instructions.  ECHO (in 1 year) Your physician has requested that you have an echocardiogram. Echocardiography is a painless test that uses sound waves to create images of your heart. It provides your doctor with information about the size and shape of your heart and how well your heart's chambers and valves are working. This procedure takes approximately one hour. There are no restrictions for this procedure. Please do NOT wear cologne, perfume, aftershave, or lotions (deodorant is allowed). Please arrive 15 minutes prior to your appointment time.  Please note: We ask at that you not bring children with you during ultrasound (echo/ vascular) testing. Due to room size and safety concerns, children are not allowed in the ultrasound rooms during exams. Our front office staff cannot provide observation of children in our lobby area while testing is being conducted. An adult accompanying a patient to their appointment will only be allowed in the ultrasound room at the discretion of the ultrasound technician under special circumstances. We apologize for any inconvenience.  Follow-Up: At [facility], you and your health needs are our priority.  As part of our continuing mission to provide you with exceptional heart care, we have created designated  Provider Care Teams.  These Care Teams include your primary Cardiologist (physician) and Advanced Practice Providers (APPs -  Physician Assistants and Nurse Practitioners) who all work together to provide you with the care you need, when you need it.  We recommend signing up for the patient portal called "MyChart".  Sign up information is provided on this After Visit Summary.  MyChart is used to connect with patients for Virtual Visits (Telemedicine).  Patients are able to view lab/test results, encounter notes, upcoming appointments, etc.  Non-urgent messages can be sent to your provider as well.   To learn more about what you can do with MyChart, go to [email or web address].    Your next appointment:   1 year(s)  Provider:   Riley Lam, MD

## 2023-09-09 LAB — BASIC METABOLIC PANEL
BUN/Creatinine Ratio: 15 (ref 12–28)
BUN: 11 mg/dL (ref 8–27)
CO2: 24 mmol/L (ref 20–29)
Calcium: 9.6 mg/dL (ref 8.7–10.3)
Chloride: 98 mmol/L (ref 96–106)
Creatinine, Ser: 0.73 mg/dL (ref 0.57–1.00)
Glucose: 88 mg/dL (ref 70–99)
Potassium: 4.1 mmol/L (ref 3.5–5.2)
Sodium: 139 mmol/L (ref 134–144)
eGFR: 85 mL/min/{1.73_m2} (ref >59)

## 2023-09-09 LAB — LIPID PANEL
Chol/HDL Ratio: 3 {ratio} (ref 0.0–4.4)
Cholesterol, Total: 163 mg/dL (ref 100–199)
HDL: 55 mg/dL (ref >39)
LDL Chol Calc (NIH): 89 mg/dL (ref 0–99)
Triglycerides: 106 mg/dL (ref 0–149)
VLDL Cholesterol Cal: 19 mg/dL (ref 5–40)

## 2023-09-09 LAB — ALT: ALT: 20 [IU]/L (ref 0–32)

## 2023-09-18 ENCOUNTER — Telehealth: Payer: Self-pay

## 2023-09-18 DIAGNOSIS — E782 Mixed hyperlipidemia: Secondary | ICD-10-CM

## 2023-09-18 DIAGNOSIS — Z79899 Other long term (current) drug therapy: Secondary | ICD-10-CM

## 2023-09-18 MED ORDER — ROSUVASTATIN CALCIUM 10 MG PO TABS: 10.0000 mg | ORAL_TABLET | Freq: Every day | ORAL | 3 refills | Status: AC

## 2023-09-18 MED ORDER — EZETIMIBE 10 MG PO TABS: 10.0000 mg | ORAL_TABLET | Freq: Every day | ORAL | 3 refills | Status: AC

## 2023-09-18 NOTE — Telephone Encounter (Signed)
Called and spoke with patient who verbalized understanding. Crestor 10mg  sent to pharmacy on file. Requested refill on zetia, sent that also at this time. Repeat labs placed at this time.

## 2023-09-18 NOTE — Telephone Encounter (Signed)
-----   Message from Kristeen Miss sent at 09/09/2023  9:46 AM EST ----- BMP looks great  Lipids are stable  LDL is 89 .  Her goal is < 70 Lets increase her rosuvastatin to 10 mg a day  Continue zetia 10 mg a day . Continue heart healthy diet, exercise program   Recheck labs in 3 months

## 2023-09-21 ENCOUNTER — Other Ambulatory Visit: Payer: Self-pay | Admitting: Family Medicine

## 2023-09-23 ENCOUNTER — Ambulatory Visit (HOSPITAL_COMMUNITY)
Admission: RE | Admit: 2023-09-23 | Discharge: 2023-09-23 | Disposition: A | Discharge status: Home / Self Care | Payer: Medicare Other | Source: Ambulatory Visit | Attending: Cardiovascular Disease | Admitting: Cardiovascular Disease

## 2023-09-23 ENCOUNTER — Other Ambulatory Visit: Payer: Self-pay | Admitting: Family Medicine

## 2023-09-23 DIAGNOSIS — E782 Mixed hyperlipidemia: Secondary | ICD-10-CM | POA: Diagnosis not present

## 2023-09-23 DIAGNOSIS — R0989 Other specified symptoms and signs involving the circulatory and respiratory systems: Secondary | ICD-10-CM | POA: Diagnosis not present

## 2023-09-23 DIAGNOSIS — I350 Nonrheumatic aortic (valve) stenosis: Secondary | ICD-10-CM | POA: Insufficient documentation

## 2023-09-29 ENCOUNTER — Telehealth: Payer: Self-pay | Admitting: Cardiovascular Disease

## 2023-09-29 NOTE — Telephone Encounter (Signed)
Patient is returning call in regards to results. Requesting call back. 

## 2023-09-29 NOTE — Telephone Encounter (Signed)
Attempted to reach patient at this number, still no answer and no voicemail set up. Will re-attempt later.  Nahser, Deloris Ping, MD sent to Lars Mage, RN Mild plaque in his right ICA  Repeat carotid duplex scan in 1 year

## 2023-10-02 ENCOUNTER — Ambulatory Visit: Payer: Medicare Other | Admitting: Family Medicine

## 2023-10-02 ENCOUNTER — Encounter: Payer: Self-pay | Admitting: Family Medicine

## 2023-10-02 VITALS — BP 118/60 | HR 64 | Temp 98.2°F | Ht 65.0 in | Wt 167.8 lb

## 2023-10-02 DIAGNOSIS — E782 Mixed hyperlipidemia: Secondary | ICD-10-CM | POA: Diagnosis not present

## 2023-10-02 DIAGNOSIS — K219 Gastro-esophageal reflux disease without esophagitis: Secondary | ICD-10-CM

## 2023-10-02 DIAGNOSIS — Z1211 Encounter for screening for malignant neoplasm of colon: Secondary | ICD-10-CM | POA: Diagnosis not present

## 2023-10-02 DIAGNOSIS — R739 Hyperglycemia, unspecified: Secondary | ICD-10-CM | POA: Diagnosis not present

## 2023-10-02 DIAGNOSIS — I10 Essential (primary) hypertension: Secondary | ICD-10-CM

## 2023-10-02 LAB — CBC WITH DIFFERENTIAL/PLATELET
Basophils Absolute: 0.1 10*3/uL (ref 0.0–0.1)
Basophils Relative: 0.8 % (ref 0.0–3.0)
Eosinophils Absolute: 0.2 10*3/uL (ref 0.0–0.7)
Eosinophils Relative: 2.3 % (ref 0.0–5.0)
HCT: 40 % (ref 36.0–46.0)
Hemoglobin: 13.5 g/dL (ref 12.0–15.0)
Lymphocytes Relative: 20 % (ref 12.0–46.0)
Lymphs Abs: 1.9 10*3/uL (ref 0.7–4.0)
MCHC: 33.8 g/dL (ref 30.0–36.0)
MCV: 90.7 fL (ref 78.0–100.0)
Monocytes Absolute: 0.7 10*3/uL (ref 0.1–1.0)
Monocytes Relative: 7.2 % (ref 3.0–12.0)
Neutro Abs: 6.7 10*3/uL (ref 1.4–7.7)
Neutrophils Relative %: 69.7 % (ref 43.0–77.0)
Platelets: 376 10*3/uL (ref 150.0–400.0)
RBC: 4.41 Mil/uL (ref 3.87–5.11)
RDW: 13.8 % (ref 11.5–15.5)
WBC: 9.5 10*3/uL (ref 4.0–10.5)

## 2023-10-02 LAB — HEMOGLOBIN A1C: Hgb A1c MFr Bld: 6.5 % (ref 4.6–6.5)

## 2023-10-02 LAB — TSH: TSH: 3 u[IU]/mL (ref 0.35–5.50)

## 2023-10-02 NOTE — Progress Notes (Signed)
Subjective:    Patient ID: Anne [name], female    DOB: [birth year], 77 y.o.   MRN: [id number]  HPI Here to follow up on issues. She feels well in general. Her Bp has been stable. She saw Dr. Lourena Simmonds a few weeks ago and he seemed to think she was doing well. She had some fasting labs that showed her LDL to be 89. He increased her Rosuvastatin to 10 mg daily because our goal is to get the LDL below 70. Her renal function was normal. He plans to get another ECHO next year. Otherwise her GERD is stable. She gets regular mammograms. She still has not had a colonoscopy yet. We set this up a year or so ago, but she never followe through with it.    Review of Systems  Constitutional: Negative.   HENT: Negative.    Eyes: Negative.   Respiratory: Negative.    Cardiovascular: Negative.   Gastrointestinal: Negative.   Genitourinary:  Negative for decreased urine volume, difficulty urinating, dyspareunia, dysuria, enuresis, flank pain, frequency, hematuria, pelvic pain and urgency.  Musculoskeletal: Negative.   Skin: Negative.   Neurological: Negative.  Negative for headaches.  Psychiatric/Behavioral: Negative.         Objective:   Physical Exam Constitutional:      General: She is not in acute distress.    Appearance: She is well-developed. She is obese.  HENT:     Head: Normocephalic and atraumatic.     Right Ear: External ear normal.     Left Ear: External ear normal.     Nose: Nose normal.     Mouth/Throat:     Pharynx: No oropharyngeal exudate.  Eyes:     General: No scleral icterus.    Conjunctiva/sclera: Conjunctivae normal.     Pupils: Pupils are equal, round, and reactive to light.  Neck:     Thyroid: No thyromegaly.     Vascular: No JVD.  Cardiovascular:     Rate and Rhythm: Normal rate and regular rhythm.     Pulses: Normal pulses.     Heart sounds: Normal heart sounds. No murmur heard.    No friction rub. No gallop.  Pulmonary:     Effort: Pulmonary effort is  normal. No respiratory distress.     Breath sounds: Normal breath sounds. No wheezing or rales.  Chest:     Chest wall: No tenderness.  Abdominal:     General: Bowel sounds are normal. There is no distension.     Palpations: Abdomen is soft. There is no mass.     Tenderness: There is no abdominal tenderness. There is no guarding or rebound.  Musculoskeletal:        General: No tenderness. Normal range of motion.     Cervical back: Normal range of motion and neck supple.  Lymphadenopathy:     Cervical: No cervical adenopathy.  Skin:    General: Skin is warm and dry.     Findings: No erythema or rash.  Neurological:     General: No focal deficit present.     Mental Status: She is alert and oriented to person, place, and time.     Cranial Nerves: No cranial nerve deficit.     Motor: No abnormal muscle tone.     Coordination: Coordination normal.     Deep Tendon Reflexes: Reflexes are normal and symmetric. Reflexes normal.  Psychiatric:        Mood and Affect: Mood normal.  Behavior: Behavior normal.        Thought Content: Thought content normal.        Judgment: Judgment normal.           Assessment & Plan:  Her HTN is well controlled. Her dyslipidemia is being closely managed by Dr. Lourena Simmonds. Her GERD st stable. We will get labs today including CBC, A1c, and TSH. We will set up a colonoscopy. We spent a total of ( 34  ) minutes reviewing records and discussing these issues.  Gershon Crane, MD

## 2023-10-03 NOTE — Telephone Encounter (Signed)
Called patient and left message on machine asking that she call office back.

## 2023-10-06 NOTE — Telephone Encounter (Signed)
Patient is returning RN's call. Please advise. 

## 2023-10-06 NOTE — Telephone Encounter (Signed)
Returned call to patient and told her the below:  Anne [name], Anne Ping, MD sent to Lars Mage, RN Mild plaque in his right ICA  Repeat carotid duplex scan in 1 year  Already discussed with patient. She doesn't have any questions.

## 2023-10-22 ENCOUNTER — Telehealth: Payer: Self-pay | Admitting: Family Medicine

## 2023-10-22 NOTE — Telephone Encounter (Signed)
 Patient is returning a call there is no messages in patient chart on who called she would like a call back and would like a message left she stated she is not able to get to the phone fast enough it rings two times then hangs up.

## 2023-10-24 NOTE — Telephone Encounter (Signed)
 Left a message for pt on her voicemail advised to all the office back regarding lab results done last month

## 2023-10-29 ENCOUNTER — Ambulatory Visit: Payer: Medicare PPO

## 2023-10-29 ENCOUNTER — Other Ambulatory Visit: Payer: Self-pay

## 2023-10-29 VITALS — Ht 65.0 in | Wt 167.0 lb

## 2023-10-29 DIAGNOSIS — Z0000 Encounter for general adult medical examination without abnormal findings: Secondary | ICD-10-CM | POA: Diagnosis not present

## 2023-10-29 MED ORDER — FLUAD 0.5 ML IM SUSY
0.5000 mL | PREFILLED_SYRINGE | Freq: Once | INTRAMUSCULAR | 0 refills | Status: AC
  Filled 2023-10-29: qty 0.5, 1d supply, fill #0

## 2023-10-29 NOTE — Patient Instructions (Addendum)
 Ms. Araiza , Thank you for taking time to come for your Medicare Wellness Visit. I appreciate your ongoing commitment to your health goals. Please review the following plan we discussed and let me know if I can assist you in the future.   Referrals/Orders/Follow-Ups/Clinician Recommendations:   This is a list of the screening recommended for you and due dates:  Health Maintenance  Topic Date Due   Pneumonia Vaccine (1 of 2 - PCV) Never done   Zoster (Shingles) Vaccine (1 of 2) Never done   Flu Shot  05/15/2023   COVID-19 Vaccine (4 - 2024-25 season) 09/16/2023   Medicare Annual Wellness Visit  10/28/2024   DTaP/Tdap/Td vaccine (2 - Td or Tdap) 08/02/2032   DEXA scan (bone density measurement)  Completed   Hepatitis C Screening  Completed   HPV Vaccine  Aged Out    Advanced directives: (Declined) Advance directive discussed with you today. Even though you declined this today, please call our office should you change your mind, and we can give you the proper paperwork for you to fill out.  Next Medicare Annual Wellness Visit scheduled for next year: Yes

## 2023-10-29 NOTE — Progress Notes (Signed)
Subjective:   Anne [name] is a 78 y.o. female who presents for Medicare Annual (Subsequent) preventive examination.  Visit Complete: Virtual I connected with  Anne [name] on 10/29/23 by a audio enabled telemedicine application and verified that I am speaking with the correct person using two identifiers.  Patient Location: Home  Provider Location: Home Office  I discussed the limitations of evaluation and management by telemedicine. The patient expressed understanding and agreed to proceed.  Vital Signs: Because this visit was a virtual/telehealth visit, some criteria may be missing or patient reported. Any vitals not documented were not able to be obtained and vitals that have been documented are patient reported.     Cardiac Risk Factors include: advanced age (>38men, >34 women);hypertension     Objective:    Today's Vitals   10/29/23 0931  Weight: 167 lb (75.8 kg)  Height: 5\' 5"  (1.651 m)   Body mass index is 27.79 kg/m.     10/29/2023    9:38 AM 10/21/2022    9:37 AM 06/25/2022   10:05 AM 10/16/2021    8:42 AM 07/16/2021    5:29 PM 06/30/2021   11:58 PM 10/03/2020    8:23 AM  Advanced Directives  Does Patient Have a Medical Advance Directive? No No No No No No No  Would patient like information on creating a medical advance directive? No - Patient declined No - Patient declined  Yes (MAU/Ambulatory/Procedural Areas - Information given) No - Patient declined  No - Patient declined    Current Medications (verified) Outpatient Encounter Medications as of 10/29/2023  Medication Sig   acetaminophen (TYLENOL) 325 MG tablet Take 650 mg by mouth every 6 (six) hours as needed for mild pain.   amLODipine (NORVASC) 10 MG tablet Take 1 tablet (10 mg total) by mouth daily.   aspirin 81 MG tablet Take 81 mg by mouth daily.   benzonatate (TESSALON) 100 MG capsule Take 1 capsule (100 mg total) by mouth 3 (three) times daily as needed for cough.   Calcium-Vitamin D (CALTRATE  600 PLUS-VIT D PO) Take 1 tablet by mouth daily.   COVID-19 mRNA vaccine, Pfizer, (COMIRNATY) syringe Inject into the muscle.   ezetimibe (ZETIA) 10 MG tablet Take 1 tablet (10 mg total) by mouth daily.   hydrochlorothiazide (HYDRODIURIL) 25 MG tablet TAKE 1 TABLET BY MOUTH EVERY DAY   influenza vaccine adjuvanted (FLUAD) 0.5 ML injection Inject into the muscle.   ketoconazole (NIZORAL) 2 % shampoo Apply 1 Application topically 2 (two) times a week.   KLOR-CON M20 20 MEQ tablet TAKE 1 TABLET BY MOUTH EVERY DAY   loratadine (CLARITIN) 10 MG tablet Take 1 tablet (10 mg total) by mouth daily.   losartan (COZAAR) 50 MG tablet Take 1 tablet (50 mg total) by mouth daily.   meclizine (ANTIVERT) 25 MG tablet Take 1 tablet (25 mg total) by mouth every 4 (four) hours as needed for dizziness.   Multiple Vitamins-Minerals (CENTRUM SILVER PO) Take 1 tablet by mouth daily.   omeprazole (PRILOSEC) 40 MG capsule Take 1 capsule (40 mg total) by mouth daily.   ondansetron (ZOFRAN-ODT) 4 MG disintegrating tablet Take 1 tablet (4 mg total) by mouth every 8 (eight) hours as needed for nausea or vomiting.   rosuvastatin (CRESTOR) 10 MG tablet Take 1 tablet (10 mg total) by mouth daily.   vitamin C (ASCORBIC ACID) 500 MG tablet Take 500 mg by mouth daily.   No facility-administered encounter medications on file as of  10/29/2023.    Allergies (verified) Iodine, Latex, and Shellfish allergy   History: Past Medical History:  Diagnosis Date   Hyperlipidemia    Hypertension    Osteopenia    per DEXA on 07-19-09   Stroke (HCC) 09-2007   has mild left leg weakness, saw Dr. Vickey Huger   History reviewed. No pertinent surgical history. Family History  Family history unknown: Yes   Social History   Socioeconomic History   Marital status: Single    Spouse name: Not on file   Number of children: Not on file   Years of education: Not on file   Highest education level: Not on file  Occupational History    Occupation: part itme retail  Tobacco Use   Smoking status: Never   Smokeless tobacco: Never  Vaping Use   Vaping status: Never Used  Substance and Sexual Activity   Alcohol use: Not Currently    Comment: rare   Drug use: No   Sexual activity: Not Currently  Other Topics Concern   Not on file  Social History Narrative   Not on file   Social Drivers of Health   Financial Resource Strain: Low Risk  (10/29/2023)   Overall Financial Resource Strain (CARDIA)    Difficulty of Paying Living Expenses: Not hard at all  Food Insecurity: No Food Insecurity (10/29/2023)   Hunger Vital Sign    Worried About Running Out of Food in the Last Year: Never true    Ran Out of Food in the Last Year: Never true  Transportation Needs: No Transportation Needs (10/29/2023)   PRAPARE - Administrator, Civil Service (Medical): No    Lack of Transportation (Non-Medical): No  Physical Activity: Inactive (10/29/2023)   Exercise Vital Sign    Days of Exercise per Week: 0 days    Minutes of Exercise per Session: 0 min  Stress: No Stress Concern Present (10/29/2023)   [employer] of Occupational Health - Occupational Stress Questionnaire    Feeling of Stress : Not at all  Social Connections: Moderately Integrated (10/29/2023)   Social Connection and Isolation Panel [NHANES]    Frequency of Communication with Friends and Family: More than three times a week    Frequency of Social Gatherings with Friends and Family: More than three times a week    Attends Religious Services: More than 4 times per year    Active Member of [employer] or Organizations: Yes    Attends Engineer, structural: More than 4 times per year    Marital Status: Never married    Tobacco Counseling Counseling given: Not Answered   Clinical Intake:  Pre-visit preparation completed: Yes Activities of Daily Living    10/29/2023    9:37 AM  In your present state of health, do you have any difficulty performing the  following activities:  Hearing? 0  Vision? 0  Difficulty concentrating or making decisions? 0  Walking or climbing stairs? 0  Dressing or bathing? 0  Doing errands, shopping? 0  Preparing Food and eating ? N  Using the Toilet? N  In the past six months, have you accidently leaked urine? N  Do you have problems with loss of bowel control? N  Managing your Medications? N  Managing your Finances? N  Housekeeping or managing your Housekeeping? N    Patient Care Team: Nelwyn Salisbury, MD as PCP - General  Indicate any recent Medical Services you may have received from other than [facility] providers in the  past year (date may be approximate).     Assessment:   This is a routine wellness examination for [place].  Hearing/Vision screen Hearing Screening - Comments:: Denies hearing difficulties   Vision Screening - Comments:: Wears rx glasses - up to date with routine eye exams with  Dr Dione Booze   Goals Addressed               This Visit's Progress     Increase physical activity (pt-stated)        Lose weight       Depression Screen    10/29/2023    9:35 AM 10/21/2022    9:36 AM 03/12/2022    9:50 AM 10/16/2021    8:43 AM 10/03/2020    8:20 AM 09/03/2018    9:39 AM 11/09/2014   10:49 AM  PHQ 2/9 Scores  PHQ - 2 Score 0 0 1 1 1 1  0  PHQ- 9 Score   2        Fall Risk    10/29/2023    9:37 AM 10/21/2022    9:37 AM 03/12/2022    9:51 AM 10/16/2021    8:43 AM 04/23/2021    1:34 PM  Fall Risk   Falls in the past year? 1 0 1 0 1  Number falls in past yr: 0 0 0  0  Injury with Fall? 0 0 1  0  Risk for fall due to : No Fall Risks No Fall Risks Impaired balance/gait Medication side effect No Fall Risks  Follow up Falls prevention discussed Falls prevention discussed Falls evaluation completed Falls evaluation completed;Education provided;Falls prevention discussed Follow up appointment  Comment     [facility]    MEDICARE RISK AT HOME: Medicare Risk at Home Any stairs in  or around the home?: Yes If so, are there any without handrails?: No Home free of loose throw rugs in walkways, pet beds, electrical cords, etc?: Yes Adequate lighting in your home to reduce risk of falls?: Yes Life alert?: No Use of a cane, walker or w/c?: No Grab bars in the bathroom?: Yes Shower chair or bench in shower?: Yes Elevated toilet seat or a handicapped toilet?: Yes  TIMED UP AND GO:  Was the test performed?  No    Cognitive Function:    09/03/2018    9:43 AM  MMSE - Mini Mental State Exam  Not completed: --        10/29/2023    9:38 AM 10/21/2022    9:38 AM 10/16/2021    8:47 AM 10/03/2020    8:27 AM  6CIT Screen  What Year? 0 points 0 points 0 points 0 points  What month? 0 points 0 points 0 points 0 points  What time? 0 points 0 points 0 points   Count back from 20 0 points 0 points 0 points 0 points  Months in reverse 4 points 2 points 0 points 4 points  Repeat phrase 2 points 0 points 4 points 0 points  Total Score 6 points 2 points 4 points     Immunizations Immunization History  Administered Date(s) Administered   Fluad Quad(high Dose 65+) 07/30/2019, 09/27/2020, 08/29/2021, 08/01/2022   Influenza, High Dose Seasonal PF 11/17/2015, 10/18/2016, 10/23/2017, 07/28/2018   Moderna Covid-19 Vaccine Bivalent Booster 20yrs & up 08/14/2021   Moderna SARS-COV2 Booster Vaccination 08/31/2020, 05/04/2021   Moderna Sars-Covid-2 Vaccination 08/31/2020   Pfizer(Comirnaty)Fall Seasonal Vaccine 12 years and older 07/22/2023   Tdap 08/02/2022  TDAP status: Up to date  Flu Vaccine status: Due, Education has been provided regarding the importance of this vaccine. Advised may receive this vaccine at local pharmacy or Health Dept. Aware to provide a copy of the vaccination record if obtained from local pharmacy or Health Dept. Verbalized acceptance and understanding.  Pneumococcal vaccine status: Due, Education has been provided regarding the importance of this  vaccine. Advised may receive this vaccine at local pharmacy or Health Dept. Aware to provide a copy of the vaccination record if obtained from local pharmacy or Health Dept. Verbalized acceptance and understanding.  Covid-19 vaccine status: Declined, Education has been provided regarding the importance of this vaccine but patient still declined. Advised may receive this vaccine at local pharmacy or Health Dept.or vaccine clinic. Aware to provide a copy of the vaccination record if obtained from local pharmacy or Health Dept. Verbalized acceptance and understanding.  Qualifies for Shingles Vaccine? Yes   Zostavax completed No   Shingrix Completed?: No.    Education has been provided regarding the importance of this vaccine. Patient has been advised to call insurance company to determine out of pocket expense if they have not yet received this vaccine. Advised may also receive vaccine at local pharmacy or Health Dept. Verbalized acceptance and understanding.  Screening Tests Health Maintenance  Topic Date Due   Pneumonia Vaccine 50+ Years old (1 of 2 - PCV) Never done   Zoster Vaccines- Shingrix (1 of 2) Never done   INFLUENZA VACCINE  05/15/2023   COVID-19 Vaccine (4 - 2024-25 season) 09/16/2023   Medicare Annual Wellness (AWV)  10/28/2024   DTaP/Tdap/Td (2 - Td or Tdap) 08/02/2032   DEXA SCAN  Completed   Hepatitis C Screening  Completed   HPV VACCINES  Aged Out    Health Maintenance  Health Maintenance Due  Topic Date Due   Pneumonia Vaccine 65+ Years old (1 of 2 - PCV) Never done   Zoster Vaccines- Shingrix (1 of 2) Never done   INFLUENZA VACCINE  05/15/2023   COVID-19 Vaccine (4 - 2024-25 season) 09/16/2023        Bone Density status: Completed 04/25/20. Results reflect: Bone density results: OSTEOPOROSIS. Repeat every   years.    Additional Screening:  Hepatitis C Screening: does qualify; Completed 09/03/18  Vision Screening: Recommended annual ophthalmology exams for  early detection of glaucoma and other disorders of the eye. Is the patient up to date with their annual eye exam?  Yes  Who is the provider or what is the name of the office in which the patient attends annual eye exams? Dr Dione Booze If pt is not established with a provider, would they like to be referred to a provider to establish care? No .   Dental Screening: Recommended annual dental exams for proper oral hygiene   Community Resource Referral / Chronic Care Management:  CRR required this visit?  No   CCM required this visit?  No     Plan:     I have personally reviewed and noted the following in the patient's chart:   Medical and social history Use of alcohol, tobacco or illicit drugs  Current medications and supplements including opioid prescriptions. Patient is not currently taking opioid prescriptions. Functional ability and status Nutritional status Physical activity Advanced directives List of other physicians Hospitalizations, surgeries, and ER visits in previous 12 months Vitals Screenings to include cognitive, depression, and falls Referrals and appointments  In addition, I have reviewed and discussed with patient certain preventive  protocols, quality metrics, and best practice recommendations. A written personalized care plan for preventive services as well as general preventive health recommendations were provided to patient.     Tillie Rung, LPN   1/61/0960   After Visit Summary: (MyChart) Due to this being a telephonic visit, the after visit summary with patients personalized plan was offered to patient via MyChart   Nurse Notes: None

## 2023-11-21 ENCOUNTER — Emergency Department (HOSPITAL_COMMUNITY): Payer: Medicare PPO

## 2023-11-21 ENCOUNTER — Emergency Department (HOSPITAL_COMMUNITY)
Admission: EM | Admit: 2023-11-21 | Discharge: 2023-11-22 | Disposition: A | Discharge status: Home / Self Care | Payer: Medicare PPO | Attending: Emergency Medicine | Admitting: Emergency Medicine

## 2023-11-21 ENCOUNTER — Other Ambulatory Visit: Payer: Self-pay

## 2023-11-21 ENCOUNTER — Ambulatory Visit (HOSPITAL_COMMUNITY)
Admission: EM | Admit: 2023-11-21 | Discharge: 2023-11-21 | Disposition: A | Discharge status: Home / Self Care | Payer: Medicare PPO | Attending: Physician Assistant | Admitting: Physician Assistant

## 2023-11-21 ENCOUNTER — Encounter (HOSPITAL_COMMUNITY): Payer: Self-pay

## 2023-11-21 DIAGNOSIS — R059 Cough, unspecified: Secondary | ICD-10-CM | POA: Insufficient documentation

## 2023-11-21 DIAGNOSIS — Z20822 Contact with and (suspected) exposure to covid-19: Secondary | ICD-10-CM | POA: Insufficient documentation

## 2023-11-21 DIAGNOSIS — R42 Dizziness and giddiness: Secondary | ICD-10-CM

## 2023-11-21 DIAGNOSIS — E876 Hypokalemia: Secondary | ICD-10-CM

## 2023-11-21 DIAGNOSIS — R2990 Unspecified symptoms and signs involving the nervous system: Secondary | ICD-10-CM | POA: Diagnosis not present

## 2023-11-21 DIAGNOSIS — I700 Atherosclerosis of aorta: Secondary | ICD-10-CM | POA: Diagnosis not present

## 2023-11-21 DIAGNOSIS — Z8616 Personal history of COVID-19: Secondary | ICD-10-CM | POA: Insufficient documentation

## 2023-11-21 DIAGNOSIS — R55 Syncope and collapse: Secondary | ICD-10-CM | POA: Insufficient documentation

## 2023-11-21 DIAGNOSIS — R112 Nausea with vomiting, unspecified: Secondary | ICD-10-CM

## 2023-11-21 DIAGNOSIS — H5702 Anisocoria: Secondary | ICD-10-CM | POA: Insufficient documentation

## 2023-11-21 DIAGNOSIS — H5509 Other forms of nystagmus: Secondary | ICD-10-CM | POA: Insufficient documentation

## 2023-11-21 DIAGNOSIS — I6782 Cerebral ischemia: Secondary | ICD-10-CM | POA: Insufficient documentation

## 2023-11-21 DIAGNOSIS — Z8673 Personal history of transient ischemic attack (TIA), and cerebral infarction without residual deficits: Secondary | ICD-10-CM | POA: Insufficient documentation

## 2023-11-21 DIAGNOSIS — Z79899 Other long term (current) drug therapy: Secondary | ICD-10-CM | POA: Insufficient documentation

## 2023-11-21 DIAGNOSIS — D72829 Elevated white blood cell count, unspecified: Secondary | ICD-10-CM

## 2023-11-21 DIAGNOSIS — Z7982 Long term (current) use of aspirin: Secondary | ICD-10-CM | POA: Insufficient documentation

## 2023-11-21 DIAGNOSIS — Z91040 Latex allergy status: Secondary | ICD-10-CM | POA: Insufficient documentation

## 2023-11-21 DIAGNOSIS — I4510 Unspecified right bundle-branch block: Secondary | ICD-10-CM | POA: Insufficient documentation

## 2023-11-21 DIAGNOSIS — I10 Essential (primary) hypertension: Secondary | ICD-10-CM | POA: Insufficient documentation

## 2023-11-21 DIAGNOSIS — R110 Nausea: Secondary | ICD-10-CM | POA: Insufficient documentation

## 2023-11-21 DIAGNOSIS — R270 Ataxia, unspecified: Secondary | ICD-10-CM | POA: Diagnosis not present

## 2023-11-21 DIAGNOSIS — R0981 Nasal congestion: Secondary | ICD-10-CM | POA: Insufficient documentation

## 2023-11-21 LAB — CBC WITH DIFFERENTIAL/PLATELET
Abs Immature Granulocytes: 0.1 10*3/uL — ABNORMAL HIGH (ref 0.00–0.07)
Basophils Absolute: 0.1 10*3/uL (ref 0.0–0.1)
Basophils Relative: 0 %
Eosinophils Absolute: 0 10*3/uL (ref 0.0–0.5)
Eosinophils Relative: 0 %
HCT: 42.1 % (ref 36.0–46.0)
Hemoglobin: 14 g/dL (ref 12.0–15.0)
Immature Granulocytes: 1 %
Lymphocytes Relative: 6 %
Lymphs Abs: 1 10*3/uL (ref 0.7–4.0)
MCH: 30.4 pg (ref 26.0–34.0)
MCHC: 33.3 g/dL (ref 30.0–36.0)
MCV: 91.5 fL (ref 80.0–100.0)
Monocytes Absolute: 0.5 10*3/uL (ref 0.1–1.0)
Monocytes Relative: 3 %
Neutro Abs: 16 10*3/uL — ABNORMAL HIGH (ref 1.7–7.7)
Neutrophils Relative %: 90 %
Platelets: 408 10*3/uL — ABNORMAL HIGH (ref 150–400)
RBC: 4.6 MIL/uL (ref 3.87–5.11)
RDW: 12.8 % (ref 11.5–15.5)
WBC: 17.7 10*3/uL — ABNORMAL HIGH (ref 4.0–10.5)
nRBC: 0 % (ref 0.0–0.2)

## 2023-11-21 LAB — BASIC METABOLIC PANEL
Anion gap: 16 — ABNORMAL HIGH (ref 5–15)
BUN: 8 mg/dL (ref 8–23)
CO2: 27 mmol/L (ref 22–32)
Calcium: 9.6 mg/dL (ref 8.9–10.3)
Chloride: 93 mmol/L — ABNORMAL LOW (ref 98–111)
Creatinine, Ser: 0.62 mg/dL (ref 0.44–1.00)
GFR, Estimated: >60 mL/min (ref >60)
Glucose, Bld: 130 mg/dL — ABNORMAL HIGH (ref 70–99)
Potassium: 3.3 mmol/L — ABNORMAL LOW (ref 3.5–5.1)
Sodium: 136 mmol/L (ref 135–145)

## 2023-11-21 LAB — RESP PANEL BY RT-PCR (RSV, FLU A&B, COVID)  RVPGX2
Influenza A by PCR: NEGATIVE
Influenza B by PCR: NEGATIVE
Resp Syncytial Virus by PCR: NEGATIVE
SARS Coronavirus 2 by RT PCR: NEGATIVE

## 2023-11-21 MED ORDER — ONDANSETRON 4 MG PO TBDP
4.0000 mg | ORAL_TABLET | Freq: Once | ORAL | Status: AC
  Administered 2023-11-21: 4 mg via ORAL

## 2023-11-21 MED ORDER — ONDANSETRON 4 MG PO TBDP
ORAL_TABLET | ORAL | Status: AC
  Filled 2023-11-21: qty 1

## 2023-11-21 NOTE — ED Provider Notes (Addendum)
 [facility]    CSN: [id number] Arrival date & time: 11/21/23  1925      History   Chief Complaint Chief Complaint  Patient presents with   Nausea   Dizziness    HPI Anne [name] is a 78 y.o. female.   HPI  She reports she is having dizziness, aching on the right side of her body, nausea but no vomiting   She states she had runny nose, slight cough- runny nose has persisted  She report both legs are hurting but right side is worse   She states she went to a gathering about 2 weeks ago and was told that a few people there were sick with the flu She denies knowledge of other sick contacts   She states last week she had an ear ache on the left side  She is still having a lot of nausea  She has a previous hx of stroke. Chart review shows she is taking baby aspirin, Zetia  and statin   Past Medical History:  Diagnosis Date   Hyperlipidemia    Hypertension    Osteopenia    per DEXA on 07-19-09   Stroke (HCC) 09-2007   has mild left leg weakness, saw Dr. Chalice    Patient Active Problem List   Diagnosis Date Noted   Bite, insect 04/30/2023   COVID-19 virus infection 05/27/2022   GERD (gastroesophageal reflux disease) 08/29/2021   Bradycardia, unspecified 01/02/2018   Mallet deformity of fourth finger, right 02/02/2014   Allergic rhinitis 05/08/2010   DIZZINESS 02/01/2010   Lipoma 08/11/2009   Disorder of bone and cartilage 08/11/2009   SUPRAVENTRICULAR PREMATURE BEATS 07/28/2008   FATIGUE 06/16/2008   MYOSITIS 05/12/2008   Hyperlipidemia 11/24/2007   Essential hypertension 11/24/2007    History reviewed. No pertinent surgical history.  OB History   No obstetric history on file.      Home Medications    Prior to Admission medications   Medication Sig Start Date End Date Taking? Authorizing Provider  acetaminophen  (TYLENOL ) 325 MG tablet Take 650 mg by mouth every 6 (six) hours as needed for mild pain.   Yes [provider]   amLODipine  (NORVASC ) 10 MG tablet Take 1 tablet (10 mg total) by mouth daily. 09/08/23  Yes Nahser, Aleene PARAS, MD  aspirin 81 MG tablet Take 81 mg by mouth daily.   Yes [provider]  Calcium -Vitamin D (CALTRATE 600 PLUS-VIT D PO) Take 1 tablet by mouth daily.   Yes [provider]  ezetimibe  (ZETIA ) 10 MG tablet Take 1 tablet (10 mg total) by mouth daily. 09/18/23  Yes Nahser, Aleene PARAS, MD  hydrochlorothiazide  (HYDRODIURIL ) 25 MG tablet TAKE 1 TABLET BY MOUTH EVERY DAY 09/24/23  Yes Johnny Garnette LABOR, MD  ketoconazole  (NIZORAL ) 2 % shampoo Apply 1 Application topically 2 (two) times a week. 08/04/23  Yes Johnny Garnette LABOR, MD  KLOR-CON  M20 20 MEQ tablet TAKE 1 TABLET BY MOUTH EVERY DAY 09/22/23  Yes Johnny Garnette LABOR, MD  loratadine  (CLARITIN ) 10 MG tablet Take 1 tablet (10 mg total) by mouth daily. 01/31/21  Yes Johnny Garnette LABOR, MD  losartan  (COZAAR ) 50 MG tablet Take 1 tablet (50 mg total) by mouth daily. 09/08/23  Yes Nahser, Aleene PARAS, MD  Multiple Vitamins-Minerals (CENTRUM SILVER PO) Take 1 tablet by mouth daily.   Yes [provider]  omeprazole  (PRILOSEC) 40 MG capsule Take 1 capsule (40 mg total) by mouth daily. 11/06/20  Yes Johnny Garnette LABOR, MD  ondansetron  (ZOFRAN -ODT) 4 MG disintegrating tablet Take 1 tablet (4 mg total) by mouth every 8 (eight) hours as needed for nausea or vomiting. 05/26/22  Yes Banister, Sharlet POUR, MD  rosuvastatin  (CRESTOR ) 10 MG tablet Take 1 tablet (10 mg total) by mouth daily. 09/18/23  Yes Nahser, Aleene PARAS, MD  vitamin C (ASCORBIC ACID) 500 MG tablet Take 500 mg by mouth daily.   Yes [provider]  benzonatate  (TESSALON ) 100 MG capsule Take 1 capsule (100 mg total) by mouth 3 (three) times daily as needed for cough. 05/26/22   Banister, Pamela K, MD  COVID-19 mRNA vaccine, Pfizer, (COMIRNATY ) syringe Inject into the muscle. 07/22/23   Luiz Channel, MD  influenza vaccine adjuvanted (FLUAD ) 0.5 ML injection Inject into the muscle. 07/22/23    Luiz Channel, MD  meclizine  (ANTIVERT ) 25 MG tablet Take 1 tablet (25 mg total) by mouth every 4 (four) hours as needed for dizziness. 07/03/21   Johnny Garnette LABOR, MD    Family History Family History  Family history unknown: Yes    Social History Social History   Tobacco Use   Smoking status: Never   Smokeless tobacco: Never  Vaping Use   Vaping status: Never Used  Substance Use Topics   Alcohol use: Not Currently    Comment: rare   Drug use: No     Allergies   Iodine, Latex, and Shellfish allergy   Review of Systems Review of Systems  Constitutional:  Positive for chills and fatigue. Negative for fever.  HENT:  Positive for congestion, ear pain and rhinorrhea. Negative for postnasal drip and sore throat.   Respiratory:  Positive for cough.   Gastrointestinal:  Positive for nausea. Negative for diarrhea and vomiting.  Musculoskeletal:  Positive for myalgias.  Neurological:  Positive for dizziness and headaches. Negative for syncope, facial asymmetry, speech difficulty, weakness and numbness.  Psychiatric/Behavioral:  Negative for confusion.      Physical Exam Triage Vital Signs ED Triage Vitals  Encounter Vitals Group     BP 11/21/23 2020 (!) 156/78     Systolic BP Percentile --      Diastolic BP Percentile --      Pulse Rate 11/21/23 2020 63     Resp 11/21/23 2020 18     Temp 11/21/23 2020 (!) 97.4 F (36.3 C)     Temp Source 11/21/23 2020 Oral     SpO2 11/21/23 2020 97 %     Weight 11/21/23 2019 165 lb (74.8 kg)     Height 11/21/23 2019 5' 5 (1.651 m)     Head Circumference --      Peak Flow --      Pain Score 11/21/23 2018 8     Pain Loc --      Pain Education --      Exclude from Growth Chart --    No data found.  Updated Vital Signs BP (!) 156/78 (BP Location: Right Arm)   Pulse 63   Temp (!) 97.4 F (36.3 C) (Oral)   Resp 18   Ht 5' 5 (1.651 m)   Wt 165 lb (74.8 kg)   SpO2 97%   BMI 27.46 kg/m   Visual Acuity Right Eye Distance:    Left Eye Distance:   Bilateral Distance:    Right Eye Near:   Left Eye Near:    Bilateral Near:     Physical Exam Vitals reviewed.  Constitutional:      General: She is awake.  Appearance: Normal appearance. She is well-developed and well-groomed.  HENT:     Head: Normocephalic and atraumatic.  Eyes:     General: Lids are normal. Gaze aligned appropriately.     Extraocular Movements: Extraocular movements intact.     Right eye: Normal extraocular motion and no nystagmus.     Left eye: Normal extraocular motion and no nystagmus.     Conjunctiva/sclera: Conjunctivae normal.     Pupils: Pupils are unequal.     Right eye: Pupil is not reactive.     Left eye: Pupil is not reactive.     Comments: Right pupil is approximately 2 to 3 mm in diameter and fixed.  Left pupil is approximately 4 to 5 mm in diameter with minimal reaction to light.  Cardiovascular:     Rate and Rhythm: Normal rate. Rhythm regularly irregular.     Pulses:          Radial pulses are 2+ on the right side and 2+ on the left side.     Heart sounds: Murmur heard.     Comments: Patient has irregular beat every 3 beats. Pulmonary:     Effort: Pulmonary effort is normal.     Breath sounds: Normal breath sounds. No decreased air movement. No decreased breath sounds, wheezing, rhonchi or rales.  Musculoskeletal:     Cervical back: Normal range of motion and neck supple. Normal range of motion.  Neurological:     Mental Status: She is alert and oriented to person, place, and time.     GCS: GCS eye subscore is 4. GCS verbal subscore is 5. GCS motor subscore is 6.     Cranial Nerves: Cranial nerve deficit present. No dysarthria or facial asymmetry.     Motor: No weakness, tremor, atrophy or abnormal muscle tone.     Coordination: Finger-Nose-Finger Test normal.     Gait: Gait abnormal.     Comments: Patient has difficulty smiling and there is some nasolabial flattening on the left side of her face. Strength is 5  out of 5 bilaterally with regards to hip flexion, dorsi and plantarflexion and grip strength.   Psychiatric:        Behavior: Behavior is cooperative.      UC Treatments / Results  Labs (all labs ordered are listed, but only abnormal results are displayed) Labs Reviewed - No data to display  EKG   Radiology No results found.  Procedures Procedures (including critical care time)  Medications Ordered in UC Medications  ondansetron  (ZOFRAN -ODT) disintegrating tablet 4 mg (4 mg Oral Given 11/21/23 2027)    Initial Impression / Assessment and Plan / UC Course  I have reviewed the triage vital signs and the nursing notes.  Pertinent labs & imaging results that were available during my care of the patient were reviewed by me and considered in my medical decision making (see chart for details).      Final Clinical Impressions(s) / UC Diagnoses   Final diagnoses:  Dizziness  Abnormal neurological exam    Acute, new concern Patient presents today with concerns for dizziness, runny nose, pain predominantly on the right side of her body.  She also reports nausea but no vomiting or diarrhea. Physical exam was concerning for unequal fixed pupils, nasolabial flattening, difficulty smiling.  Strength appears to be 5 out of 5 and coordination with nose to finger is intact.  Gait testing was excluded today due to dizziness and the fact that during intake she was leaning against  the wall to walk appropriately.  Given abnormal neurological exam I recommend prompt evaluation at the emergency room to rule out a stroke. Given her other constellation of symptoms and lack of point-of-care testing today I cannot rule out flu, COVID, acute viral URI.  She does have a history of vertigo but her meclizine  prescription is about 77 years old and I am unsure if she has been using this.  I discussed my concerns and recommendations with the patient and she is amenable to going to the emergency room for  stroke rule out.  Patient presented to the emergency room on her own.  Recommend close follow-up with PCP after emergency room discharge.   Discharge Instructions   None    ED Prescriptions   None    PDMP not reviewed this encounter.   Ethylene Reznick, Rocky BRAVO, PA-C 11/21/23 2106    Jaquanda Wickersham, Rocky BRAVO, PA-C 11/21/23 2111

## 2023-11-21 NOTE — ED Provider Triage Note (Signed)
 Emergency Medicine Provider Triage Evaluation Note  Anne [name] , a 78 y.o. female  was evaluated in triage.  Pt complains of dizziness x 1 week.  Review of Systems  Positive: Near syncope, cough, congestion Negative: Fever, chills, shortness of breath, chest pain, confusion  Physical Exam  BP (!) 166/76   Pulse 71   Temp 97.8 F (36.6 C)   Resp 16   Ht 5' 5 (1.651 m)   Wt 74.8 kg   SpO2 98%   BMI 27.46 kg/m  Gen:   Awake, no distress   Resp:  Normal effort  MSK:   Moves extremities without difficulty  Other:  Anisocoria which patient states is not new  Medical Decision Making  Medically screening exam initiated at 9:59 PM.  Appropriate orders placed.  TKEYA STENCIL was informed that the remainder of the evaluation will be completed by another provider, this initial triage assessment does not replace that evaluation, and the importance of remaining in the ED until their evaluation is complete.  Labs and imaging ordered   Francis Ileana SAILOR, PA-C 11/21/23 2200

## 2023-11-21 NOTE — ED Triage Notes (Addendum)
 Nausea, dizziness onset yesterday. No fevers but having chills this morning. No known sick exposure. Patient states she is feeling very nauseous at time of triage, as though she needs to throw up.   Patient is off balance and having to sway into the walls. Having slight blurred vision at times.   Patient states that she is allergic to all perfumes. States that since smelling a strong perfume this week she has not been feeling well.

## 2023-11-21 NOTE — Discharge Instructions (Addendum)
 Recommend prompt emergency room evaluation for stroke rule out.

## 2023-11-21 NOTE — ED Triage Notes (Signed)
 Pt arrived from UC c/o weakness, and almost passing out, cough, congestion. NIH 0 in triage. LKW last week. Pt states she has felt all these s/sx intermittently through out the week. Pupils noted to be unequal, pt states that this finding is not new for her.

## 2023-11-21 NOTE — ED Notes (Signed)
 Patient is being discharged from the Urgent Care and sent to the Emergency Department via Wheelchair with UC staff and patient's family member. Per [place], PA, patient is in need of higher level of care due to unequal pupils, dizziness and unsteady gait. Patient is aware and verbalizes understanding of plan of care.  Vitals:   11/21/23 2020  BP: (!) 156/78  Pulse: 63  Resp: 18  Temp: (!) 97.4 F (36.3 C)  SpO2: 97%

## 2023-11-22 ENCOUNTER — Emergency Department (HOSPITAL_COMMUNITY): Payer: Medicare PPO

## 2023-11-22 LAB — URINALYSIS, W/ REFLEX TO CULTURE (INFECTION SUSPECTED)
Bacteria, UA: NONE SEEN
Bilirubin Urine: NEGATIVE
Glucose, UA: NEGATIVE mg/dL
Hgb urine dipstick: NEGATIVE
Ketones, ur: NEGATIVE mg/dL
Leukocytes,Ua: NEGATIVE
Nitrite: NEGATIVE
Protein, ur: NEGATIVE mg/dL
Specific Gravity, Urine: 1.005 (ref 1.005–1.030)
pH: 8 (ref 5.0–8.0)

## 2023-11-22 MED ORDER — MECLIZINE HCL 25 MG PO TABS
25.0000 mg | ORAL_TABLET | Freq: Once | ORAL | Status: AC
  Administered 2023-11-22: 25 mg via ORAL
  Filled 2023-11-22: qty 1

## 2023-11-22 MED ORDER — POTASSIUM CHLORIDE 20 MEQ PO PACK
40.0000 meq | PACK | Freq: Two times a day (BID) | ORAL | Status: DC
  Administered 2023-11-22: 40 meq via ORAL
  Filled 2023-11-22: qty 2

## 2023-11-22 MED ORDER — SODIUM CHLORIDE 0.9 % IV BOLUS
500.0000 mL | Freq: Once | INTRAVENOUS | Status: AC
  Administered 2023-11-22: 500 mL via INTRAVENOUS

## 2023-11-22 MED ORDER — ONDANSETRON HCL 4 MG/2ML IJ SOLN
4.0000 mg | Freq: Once | INTRAMUSCULAR | Status: AC
  Administered 2023-11-22: 4 mg via INTRAVENOUS
  Filled 2023-11-22: qty 2

## 2023-11-22 NOTE — Discharge Instructions (Signed)
 It was our pleasure to provide your ER care today - we hope that you feel better.  Drink plenty of fluids/stay well hydrated.   From today's labs your white blood is elevated.  Also, your potassium level is mildly low - for potassium, eat plenty of fruits and vegetables, and follow up with your doctor.   For your recent symptoms and lab results, follow up closely with primary care doctor in the next 1-2 weeks - call office Monday to arrange follow up appointment.   Return to ER if worse, new symptoms, fevers, severe dizziness, new or worsening pain, chest pain, trouble breathing, abdominal pain, persistent vomiting, weak/fainting, or other concern.

## 2023-11-22 NOTE — ED Notes (Signed)
 Pt states she is unsure why she be having these dizzy spells. RN inquired about her meclizine  and pt states her PCP told her to take as needed. RN informed she may need to f/u to see if that's a medication she needs to start again.

## 2023-11-22 NOTE — ED Notes (Signed)
 Pt has returned from MRI.

## 2023-11-22 NOTE — ED Notes (Signed)
 Patient transported to MRI

## 2023-11-22 NOTE — ED Provider Notes (Signed)
 [place] EMERGENCY DEPARTMENT AT [facility] Provider Note  CSN: [id number] Arrival date & time: 11/21/23 2103  Chief Complaint(s) Dizziness  HPI Anne [name] is a 78 y.o. female with a past medical history listed below including prior strokes as well as peripheral vertigo here for dizzy spell that began yesterday afternoon.  Feels like the room is spinning and patient is having difficulty ambulating.  She reports that last week she had URI symptoms including runny nose, cough, congestion.  She reports some sick contacts a few weeks ago.  Reports that the URI symptoms have resolved.  She did endorse nausea without emesis during the episode of dizziness.  Still reports feeling dizzy but improved.  She was sent here from urgent care to rule out possible stroke.  She denied any focal deficits or paresthesias.  The history is provided by the patient.    Past Medical History Past Medical History:  Diagnosis Date   Hyperlipidemia    Hypertension    Osteopenia    per DEXA on 07-19-09   Stroke (HCC) 09-2007   has mild left leg weakness, saw Dr. Chalice   Patient Active Problem List   Diagnosis Date Noted   Bite, insect 04/30/2023   COVID-19 virus infection 05/27/2022   GERD (gastroesophageal reflux disease) 08/29/2021   Bradycardia, unspecified 01/02/2018   Mallet deformity of fourth finger, right 02/02/2014   Allergic rhinitis 05/08/2010   DIZZINESS 02/01/2010   Lipoma 08/11/2009   Disorder of bone and cartilage 08/11/2009   SUPRAVENTRICULAR PREMATURE BEATS 07/28/2008   FATIGUE 06/16/2008   MYOSITIS 05/12/2008   Hyperlipidemia 11/24/2007   Essential hypertension 11/24/2007   Home Medication(s) Prior to Admission medications   Medication Sig Start Date End Date Taking? Authorizing Provider  acetaminophen  (TYLENOL ) 325 MG tablet Take 650 mg by mouth every 6 (six) hours as needed for mild pain.    [provider]  amLODipine  (NORVASC ) 10 MG tablet Take 1  tablet (10 mg total) by mouth daily. 09/08/23   Nahser, Aleene PARAS, MD  aspirin 81 MG tablet Take 81 mg by mouth daily.    [provider]  benzonatate  (TESSALON ) 100 MG capsule Take 1 capsule (100 mg total) by mouth 3 (three) times daily as needed for cough. 05/26/22   Banister, Pamela K, MD  Calcium -Vitamin D (CALTRATE 600 PLUS-VIT D PO) Take 1 tablet by mouth daily.    [provider]  COVID-19 mRNA vaccine, Pfizer, (COMIRNATY ) syringe Inject into the muscle. 07/22/23   Luiz Channel, MD  ezetimibe  (ZETIA ) 10 MG tablet Take 1 tablet (10 mg total) by mouth daily. 09/18/23   Nahser, Aleene PARAS, MD  hydrochlorothiazide  (HYDRODIURIL ) 25 MG tablet TAKE 1 TABLET BY MOUTH EVERY DAY 09/24/23   Johnny Garnette LABOR, MD  influenza vaccine adjuvanted (FLUAD ) 0.5 ML injection Inject into the muscle. 07/22/23   Luiz Channel, MD  ketoconazole  (NIZORAL ) 2 % shampoo Apply 1 Application topically 2 (two) times a week. 08/04/23   Johnny Garnette LABOR, MD  KLOR-CON  M20 20 MEQ tablet TAKE 1 TABLET BY MOUTH EVERY DAY 09/22/23   Johnny Garnette LABOR, MD  loratadine  (CLARITIN ) 10 MG tablet Take 1 tablet (10 mg total) by mouth daily. 01/31/21   Johnny Garnette LABOR, MD  losartan  (COZAAR ) 50 MG tablet Take 1 tablet (50 mg total) by mouth daily. 09/08/23   Nahser, Aleene PARAS, MD  meclizine  (ANTIVERT ) 25 MG tablet Take 1 tablet (25 mg total) by mouth every 4 (four) hours as needed for  dizziness. 07/03/21   Johnny Garnette LABOR, MD  Multiple Vitamins-Minerals (CENTRUM SILVER PO) Take 1 tablet by mouth daily.    [provider]  omeprazole  (PRILOSEC) 40 MG capsule Take 1 capsule (40 mg total) by mouth daily. 11/06/20   Johnny Garnette LABOR, MD  ondansetron  (ZOFRAN -ODT) 4 MG disintegrating tablet Take 1 tablet (4 mg total) by mouth every 8 (eight) hours as needed for nausea or vomiting. 05/26/22   Vonna Sharlet POUR, MD  rosuvastatin  (CRESTOR ) 10 MG tablet Take 1 tablet (10 mg total) by mouth daily. 09/18/23   Nahser, Aleene PARAS, MD  vitamin C  (ASCORBIC ACID) 500 MG tablet Take 500 mg by mouth daily.    [provider]                                                                                                                                    Allergies Iodine, Latex, and Shellfish allergy  Review of Systems Review of Systems As noted in HPI  Physical Exam Vital Signs  I have reviewed the triage vital signs BP (!) 161/77   Pulse 63   Temp 97.6 F (36.4 C)   Resp 17   Ht 5' 5 (1.651 m)   Wt 74.8 kg   SpO2 98%   BMI 27.46 kg/m   Physical Exam Vitals reviewed.  Constitutional:      General: She is not in acute distress.    Appearance: She is well-developed. She is not diaphoretic.  HENT:     Head: Normocephalic and atraumatic.     Nose: Nose normal.  Eyes:     General: No scleral icterus.       Right eye: No discharge.        Left eye: No discharge.     Conjunctiva/sclera: Conjunctivae normal.     Pupils: Pupils are equal, round, and reactive to light.     Comments: Unequal pupils: R: 2mm, L:26mm Reactive to light  Cardiovascular:     Rate and Rhythm: Regular rhythm.     Heart sounds: No murmur heard.    No friction rub. No gallop.  Pulmonary:     Effort: Pulmonary effort is normal. No respiratory distress.     Breath sounds: Normal breath sounds. No stridor. No rales.  Abdominal:     General: There is no distension.     Palpations: Abdomen is soft.     Tenderness: There is no abdominal tenderness.  Musculoskeletal:        General: No tenderness.     Cervical back: Normal range of motion and neck supple.  Skin:    General: Skin is warm and dry.     Findings: No erythema or rash.  Neurological:     Mental Status: She is alert and oriented to person, place, and time.     Comments: Mental Status:  Alert and oriented to person, place, and time.  Attention  and concentration normal.  Speech clear.  Recent memory is intact  Cranial Nerves:  II Visual Fields: Intact to confrontation.  Visual fields intact. III, IV, VI: Pupils equal and reactive to light and near. Full eye movement with unilateral nystagmus  V Facial Sensation: Normal. No weakness of masticatory muscles  VII: No facial weakness or asymmetry  VIII Auditory Acuity: Grossly normal  IX/X: The uvula is midline; the palate elevates symmetrically  XI: Normal sternocleidomastoid and trapezius strength  XII: The tongue is midline. No atrophy or fasciculations.   Motor System: Muscle Strength: 5/5 and symmetric in the upper and lower extremities. No pronation or drift.  Muscle Tone: Tone and muscle bulk are normal in the upper and lower extremities.  Coordination: Intact finger-to-nose, heel-to-shin. No tremor.  Sensation: Intact to light touch, and pinprick. Negative Romberg test.  Gait: mild ataxia  HINTS Plus: Nystagmus: Unilateral with correcting to the right  Head impulse: Abnormal Skew: Normal Hearing: Intact        ED Results and Treatments Labs (all labs ordered are listed, but only abnormal results are displayed) Labs Reviewed  BASIC METABOLIC PANEL - Abnormal; Notable for the following components:      Result Value   Potassium 3.3 (*)    Chloride 93 (*)    Glucose, Bld 130 (*)    Anion gap 16 (*)    All other components within normal limits  CBC WITH DIFFERENTIAL/PLATELET - Abnormal; Notable for the following components:   WBC 17.7 (*)    Platelets 408 (*)    Neutro Abs 16.0 (*)    Abs Immature Granulocytes 0.10 (*)    All other components within normal limits  RESP PANEL BY RT-PCR (RSV, FLU A&B, COVID)  [id number]  URINALYSIS, W/ REFLEX TO CULTURE (INFECTION SUSPECTED)                                                                                                                         EKG  EKG Interpretation Date/Time:  Friday November 21 2023 21:54:54 EST Ventricular Rate:  67 PR Interval:  168 QRS Duration:  98 QT Interval:  412 QTC Calculation: 435 R Axis:   -36  Text  Interpretation: Sinus rhythm with Premature atrial complexes Left axis deviation Incomplete right bundle branch block Moderate voltage criteria for LVH, may be normal variant ( R in aVL , Cornell product ) Cannot rule out Anterior infarct , age undetermined Abnormal ECG When compared with ECG of 08-Sep-2023 15:11, PREVIOUS ECG IS PRESENT Confirmed by Trine Likes [phone]) on 11/22/2023 4:37:26 AM       Radiology CT Head Wo Contrast Result Date: 11/21/2023 CLINICAL DATA:  Syncope/presyncope, cerebrovascular cause suspected. EXAM: CT HEAD WITHOUT CONTRAST TECHNIQUE: Contiguous axial images were obtained from the base of the skull through the vertex without intravenous contrast. RADIATION DOSE REDUCTION: This exam was performed according to the departmental dose-optimization program which includes automated exposure control, adjustment of the mA and/or kV according to patient  size and/or use of iterative reconstruction technique. COMPARISON:  07/03/2022. FINDINGS: Brain: No acute intracranial hemorrhage, midline shift or mass effect. No extra-axial fluid collection. Mild atrophy is noted. Periventricular white matter hypodensities are present bilaterally. Old lacunar infarcts are present bilaterally. No hydrocephalus. Vascular: No hyperdense vessel or unexpected calcification. Skull: Normal. Negative for fracture or focal lesion. Sinuses/Orbits: No acute finding. Other: None. IMPRESSION: 1. No acute intracranial process. 2. Atrophy with chronic microvascular ischemic changes. Electronically Signed   By: Leita Birmingham M.D.   On: 11/21/2023 23:00   DG Chest 1 View Result Date: 11/21/2023 CLINICAL DATA:  Dizziness, near syncope EXAM: CHEST  1 VIEW COMPARISON:  06/25/2022 FINDINGS: Single frontal view of the chest demonstrates a stable cardiac silhouette. No airspace disease, effusion, or pneumothorax. Stable atherosclerosis of the thoracic aorta. No acute bony abnormalities. IMPRESSION: 1. Stable chest, no acute  process. Electronically Signed   By: Ozell Daring M.D.   On: 11/21/2023 22:18    Medications Ordered in ED Medications  meclizine  (ANTIVERT ) tablet 25 mg (25 mg Oral Given 11/22/23 0636)  ondansetron  (ZOFRAN ) injection 4 mg (4 mg Intravenous Given 11/22/23 0706)  sodium chloride  0.9 % bolus 500 mL (500 mLs Intravenous New Bag/Given 11/22/23 0706)   Procedures Procedures  (including critical care time) Medical Decision Making / ED Course   Medical Decision Making Amount and/or Complexity of Data Reviewed Labs: ordered. Decision-making details documented in ED Course. Radiology: ordered and independent interpretation performed. Decision-making details documented in ED Course. ECG/medicine tests: ordered and independent interpretation performed. Decision-making details documented in ED Course.  Risk Prescription drug management.    Patient presents with vertiginous symptoms. Prior history of stroke as well as peripheral vertigo. Exam is reassuring for peripheral etiology patient has unsteady gait with prior history of stroke. CBC with leukocytosis likely from demargination from vertiginous symptoms but will assess for infectious process. Viral panel negative for COVID, influenza, RSV.  Chest x-ray without evidence of pneumonia. UA pending.  CT head negative for ICH. Pending MRI  Patient care turned over to oncoming provider. Patient case and results discussed in detail; please see their note for further ED managment.       Final Clinical Impression(s) / ED Diagnoses Final diagnoses:  None    This chart was dictated using voice recognition software.  Despite best efforts to proofread,  errors can occur which can change the documentation meaning.    Trine Raynell Moder, MD 11/22/23 [id number]

## 2023-11-22 NOTE — ED Provider Notes (Addendum)
 Signed out to d/c to home if/when MRI neg for acute process.   MRI neg for acute process.   Recheck pt, feels improved. Pt denies fever, chills, sweats. No focal area of swelling, redness, or pain. No abd pain or nv. Abd soft non tender. No dysuria. No headache, no sinus pain/pressure, no neck stiffness or rigidity.   Vitals normal.  Afebrile throughout ED visit.   Pt currently appears stable for ED d/c.  Rec pcp f/u.   Return precautions provided.      Bernard Drivers, MD 11/22/23 1018

## 2023-11-22 NOTE — ED Notes (Signed)
 Pt ambulated to restroom. Pt denies dizziness at this time. Pt provided urine cup.

## 2023-11-22 NOTE — ED Notes (Signed)
 Pt provided bag lunch with saltine crackers and medication.

## 2023-12-01 ENCOUNTER — Encounter: Payer: Self-pay | Admitting: Family Medicine

## 2023-12-01 ENCOUNTER — Ambulatory Visit: Payer: Medicare PPO | Admitting: Family Medicine

## 2023-12-01 VITALS — BP 140/80 | HR 61 | Temp 97.9°F | Wt 164.0 lb

## 2023-12-01 DIAGNOSIS — R42 Dizziness and giddiness: Secondary | ICD-10-CM | POA: Insufficient documentation

## 2023-12-01 MED ORDER — MECLIZINE HCL 25 MG PO TABS
25.0000 mg | ORAL_TABLET | ORAL | 2 refills | Status: AC | PRN
Start: 2023-12-01 — End: ?

## 2023-12-01 NOTE — Progress Notes (Signed)
   Subjective:    Patient ID: Anne [name], female    DOB: [birth year], 79 y.o.   MRN: [id number]  HPI Here to follow up on an ED visit on 11-21-23 for dizziness. She admits to several days of sinus congestion before this, and then she had the onset of dizziness when she moved her head quickly. She had spells of nausea, but she did not vomit. At the ED her exam was normal. Her EKG and CXR were normal. A non-contrasted CT of the head was normal except for chronic atrophy. She was advised to start back on Claritin 10 mg daily, and this had helped with the sinus congestion.    Review of Systems  Constitutional: Negative.   HENT:  Positive for congestion. Negative for ear pain, postnasal drip, sinus pain and sore throat.   Eyes: Negative.   Respiratory: Negative.    Cardiovascular: Negative.   Neurological:  Positive for dizziness. Negative for tremors, seizures, syncope, facial asymmetry, speech difficulty, weakness, light-headedness, numbness and headaches.       Objective:   Physical Exam Constitutional:      Appearance: Normal appearance.  Cardiovascular:     Rate and Rhythm: Normal rate and regular rhythm.     Pulses: Normal pulses.     Heart sounds: Normal heart sounds.  Pulmonary:     Effort: Pulmonary effort is normal.     Breath sounds: Normal breath sounds.  Neurological:     Mental Status: She is alert.           Assessment & Plan:  She has had a spell of vertigo, and this appears to have caused her to have vertigo. Since getting back on Claritin daily, this has resolved. Recheck as needed. We spent a total of ( 35  ) minutes reviewing records and discussing these issues.   Gershon Crane, MD

## 2023-12-09 ENCOUNTER — Telehealth: Payer: Self-pay | Admitting: Cardiovascular Disease

## 2023-12-09 NOTE — Telephone Encounter (Signed)
*  STAT* If patient is at the pharmacy, call can be transferred to refill team.   1. Which medications need to be refilled? (please list name of each medication and dose if known) rosuvastatin (CRESTOR) 10 MG tablet   2. Which pharmacy/location (including street and city if local pharmacy) is medication to be sent to? rosuvastatin (CRESTOR) 10 MG tablet  3. Do they need a 30 day or 90 day supply? 30

## 2023-12-18 ENCOUNTER — Other Ambulatory Visit: Payer: Self-pay | Admitting: Family Medicine

## 2023-12-18 DIAGNOSIS — Z1231 Encounter for screening mammogram for malignant neoplasm of breast: Secondary | ICD-10-CM

## 2023-12-23 ENCOUNTER — Telehealth: Payer: Self-pay | Admitting: Cardiovascular Disease

## 2023-12-23 NOTE — Telephone Encounter (Signed)
 Pt c/o medication issue:  1. Name of Medication: ezetimibe (ZETIA) 10 MG tablet  rosuvastatin (CRESTOR) 10 MG tablet as directed  2. How are you currently taking this medication (dosage and times per day)? As directed  3. Are you having a reaction (difficulty breathing--STAT)? No  4. What is your medication issue? Muscle pain

## 2023-12-25 NOTE — Telephone Encounter (Signed)
 Left message to call back

## 2023-12-25 NOTE — Telephone Encounter (Signed)
 Pt returning call

## 2023-12-25 NOTE — Telephone Encounter (Signed)
 Left voicemail to return call to office

## 2023-12-26 ENCOUNTER — Ambulatory Visit: Admitting: Family Medicine

## 2023-12-26 ENCOUNTER — Encounter: Payer: Self-pay | Admitting: Family Medicine

## 2023-12-26 VITALS — BP 130/70 | HR 89 | Temp 98.1°F | Wt 162.4 lb

## 2023-12-26 DIAGNOSIS — M79651 Pain in right thigh: Secondary | ICD-10-CM | POA: Diagnosis not present

## 2023-12-26 NOTE — Progress Notes (Signed)
   Subjective:    Patient ID: Anne [name], female    DOB: October 24, 1945, 78 y.o.   MRN: [id number]  HPI Here for an intermittent dull aching pain in the right thigh that started about 2 weeks ago. No recent trauma. No leg swelling. At times it is so severe she has to use a cane to walk, and at other times she has no pain at all. Today is a good day with no pain. She thinks this is a side effect of her cholesterol medications. She had been taking Rosuvastatin 5 mg and Zetia 10 mg daily for several years, until her cardiologist, Dr. Elease Hashimoto, increased the Rosuvastatin to 10 mg daily last November. At that time her LDL was 89, and his goal was to get this below 70.    Review of Systems  Constitutional: Negative.   Respiratory: Negative.    Cardiovascular: Negative.   Musculoskeletal:  Positive for myalgias.       Objective:   Physical Exam Constitutional:      General: She is not in acute distress.    Appearance: Normal appearance.  Cardiovascular:     Rate and Rhythm: Normal rate and regular rhythm.     Pulses: Normal pulses.     Heart sounds: Normal heart sounds.  Pulmonary:     Effort: Pulmonary effort is normal.     Breath sounds: Normal breath sounds.  Musculoskeletal:     Comments: 1+ edema in both ankles. Her right thigh is normal today with no tenderness   Neurological:     Mental Status: She is alert.           Assessment & Plan:  Thigh pain, which could be related to her statin. She will decrease this back to 5 mg daily, and we will see how she does. She will report back in 2 weeks.  Gershon Crane, MD

## 2023-12-30 NOTE — Telephone Encounter (Signed)
 Pt saw PCP Gershon Crane) on 12/26/23:  Assessment & Plan:  Thigh pain, which could be related to her statin. She will decrease this back to 5 mg daily, and we will see how she does. She will report back in 2 weeks.  Gershon Crane, MD  Will close this encounter as we haven't been able to reach patient and it appears her statin-related muscle pain is being handled by her PCP.

## 2024-01-01 ENCOUNTER — Other Ambulatory Visit: Payer: Self-pay | Admitting: Family Medicine

## 2024-01-01 NOTE — Telephone Encounter (Signed)
 Patient is returning call.

## 2024-01-02 NOTE — Telephone Encounter (Signed)
 Attempted to return call to patient, but no answer and went to voicemail. Left detailed message stating that we were trying to return her call about a medication concern and if she wished to discuss further, to call office back.

## 2024-01-02 NOTE — Telephone Encounter (Signed)
 Patient returned call to office and stated that she's had "pain running up and down her body" and she's never had pain before so didn't know what was causing it. She questioned if it could be her statin. States pains are sporadic. She has decreased Crestor to 5mg  daily and continues zetia. She feels like there's maybe been a slight improvement in the pain. Advised that she stop completely (both meds) for 2 weeks, then resume just the Crestor at 5mg  daily and see how she feels. Can add back in zetia if tolerated. She will call and update [place]

## 2024-01-06 ENCOUNTER — Ambulatory Visit

## 2024-01-07 ENCOUNTER — Ambulatory Visit (INDEPENDENT_AMBULATORY_CARE_PROVIDER_SITE_OTHER): Admitting: Internal Medicine

## 2024-01-07 ENCOUNTER — Ambulatory Visit: Admitting: Internal Medicine

## 2024-01-07 ENCOUNTER — Ambulatory Visit: Payer: Self-pay

## 2024-01-07 ENCOUNTER — Encounter: Payer: Self-pay | Admitting: Internal Medicine

## 2024-01-07 VITALS — BP 130/84 | HR 70 | Temp 97.5°F | Wt 163.8 lb

## 2024-01-07 DIAGNOSIS — M25551 Pain in right hip: Secondary | ICD-10-CM

## 2024-01-07 NOTE — Telephone Encounter (Signed)
 Copied from CRM [id number]. Topic: Clinical - Red Word Triage >> Jan 07, 2024 10:28 AM Eunice Blase wrote: Red Word that prompted transfer to Nurse Triage: Pt call has leg pain did not want to answer questions, wants to speak with nurse.   Chief Complaint: Leg Pain  Symptoms: Leg Pain  Frequency: 3 Weeks  Pertinent Negatives: Patient denies fever, chest pain, dyspnea.   Disposition: [] ED /[] Urgent Care (no appt availability in office) / [x] Appointment(In office/virtual)/ []  [facility]/ [] Home Care/ [] Refused Recommended Disposition /[] [facility]/ []  Follow-up with PCP'  Additional Notes: CS is being triaged for leg pain.Localized to the right leg the patient complains of severe pain in her thigh that goes down that leg. The patient denies an inability to walk. Scheduled the patient an in office appointment for today.   Answer Assessment - Initial Assessment Questions 1. ONSET: "When did the pain start?"      3 Weeks  2. LOCATION: "Where is the pain located?"      Right Leg, Thigh to lower leg  3. PAIN: "How bad is the pain?"    (Scale 1-10; or mild, moderate, severe)   -  MILD (1-3): doesn't interfere with normal activities    -  MODERATE (4-7): interferes with normal activities (e.g., work or school) or awakens from sleep, limping    -  SEVERE (8-10): excruciating pain, unable to do any normal activities, unable to walk     10  4. WORK OR EXERCISE: "Has there been any recent work or exercise that involved this part of the body?"      No  5. CAUSE: "What do you think is causing the leg pain?"     Unsure, patient did fall, knees hit the pavement.   6. OTHER SYMPTOMS: "Do you have any other symptoms?" (e.g., chest pain, back pain, breathing difficulty, swelling, rash, fever, numbness, weakness)     Numbness and Weakness  7. PREGNANCY: "Is there any chance you are pregnant?" "When was your last menstrual period?"     No and No  Protocols used: Leg  Pain-A-AH

## 2024-01-07 NOTE — Progress Notes (Unsigned)
 Established Patient Office Visit     CC/Reason for Visit: Right hip pain  HPI: Anne [name] is a 78 y.o. female who is coming in today for the above mentioned reasons.  She was seen last week for right thigh pain and her statin dose was reduced thinking that this could be statin induced myalgias.  She states she has not felt any better in fact has been feeling worse.  When asked to point to the area of pain she clearly points to her right groin crease.  Says it is painful to climb steps.  Her left leg or hip is not affected.  She took 2 Tylenol this morning with significant relief.   Past Medical/Surgical History: Past Medical History:  Diagnosis Date   Hyperlipidemia    Hypertension    Osteopenia    per DEXA on 07-19-09   Stroke (HCC) 09-2007   has mild left leg weakness, saw Dr. Vickey Huger    History reviewed. No pertinent surgical history.  Social History:  reports that she has never smoked. She has never used smokeless tobacco. She reports that she does not currently use alcohol. She reports that she does not use drugs.  Allergies: Allergies  Allergen Reactions   Iodine Itching and Other (See Comments)    "lump in tongue when eats seafood"   Latex Itching    GLOVES   Shellfish Allergy Swelling    Family History:  Family History  Family history unknown: Yes     Current Outpatient Medications:    acetaminophen (TYLENOL) 325 MG tablet, Take 650 mg by mouth every 6 (six) hours as needed for mild pain., Disp: , Rfl:    amLODipine (NORVASC) 10 MG tablet, Take 1 tablet (10 mg total) by mouth daily., Disp: 90 tablet, Rfl: 3   aspirin 81 MG tablet, Take 81 mg by mouth daily., Disp: , Rfl:    benzonatate (TESSALON) 100 MG capsule, Take 1 capsule (100 mg total) by mouth 3 (three) times daily as needed for cough., Disp: 21 capsule, Rfl: 0   Calcium-Vitamin D (CALTRATE 600 PLUS-VIT D PO), Take 1 tablet by mouth daily., Disp: , Rfl:    COVID-19 mRNA vaccine, Pfizer,  (COMIRNATY) syringe, Inject into the muscle., Disp: 0.3 mL, Rfl: 0   ezetimibe (ZETIA) 10 MG tablet, Take 1 tablet (10 mg total) by mouth daily., Disp: 90 tablet, Rfl: 3   hydrochlorothiazide (HYDRODIURIL) 25 MG tablet, TAKE 1 TABLET BY MOUTH EVERY DAY, Disp: 90 tablet, Rfl: 0   influenza vaccine adjuvanted (FLUAD) 0.5 ML injection, Inject into the muscle., Disp: 0.5 mL, Rfl: 0   ketoconazole (NIZORAL) 2 % shampoo, Apply 1 Application topically 2 (two) times a week., Disp: 120 mL, Rfl: 5   KLOR-CON M20 20 MEQ tablet, TAKE 1 TABLET BY MOUTH EVERY DAY, Disp: 90 tablet, Rfl: 1   loratadine (CLARITIN) 10 MG tablet, Take 1 tablet (10 mg total) by mouth daily., Disp: 30 tablet, Rfl: 11   losartan (COZAAR) 50 MG tablet, Take 1 tablet (50 mg total) by mouth daily., Disp: 90 tablet, Rfl: 3   meclizine (ANTIVERT) 25 MG tablet, Take 1 tablet (25 mg total) by mouth every other day as needed for dizziness., Disp: 60 tablet, Rfl: 2   Multiple Vitamins-Minerals (CENTRUM SILVER PO), Take 1 tablet by mouth daily., Disp: , Rfl:    omeprazole (PRILOSEC) 40 MG capsule, Take 1 capsule (40 mg total) by mouth daily., Disp: 30 capsule, Rfl: 3   ondansetron (ZOFRAN-ODT)  4 MG disintegrating tablet, Take 1 tablet (4 mg total) by mouth every 8 (eight) hours as needed for nausea or vomiting., Disp: 10 tablet, Rfl: 0   rosuvastatin (CRESTOR) 10 MG tablet, Take 1 tablet (10 mg total) by mouth daily., Disp: 90 tablet, Rfl: 3   vitamin C (ASCORBIC ACID) 500 MG tablet, Take 500 mg by mouth daily., Disp: , Rfl:   Review of Systems:  Negative unless indicated in HPI.   Physical Exam: Vitals:   01/07/24 1555  BP: 130/84  Pulse: 70  Temp: (!) 97.5 F (36.4 C)  TempSrc: Oral  SpO2: 98%  Weight: 163 lb 12.8 oz (74.3 kg)    Body mass index is 27.26 kg/m.    Impression and Plan:  Right hip pain -     Ambulatory referral to Orthopedic Surgery   -I think this is more likely anterior hip pain than a statin induced  myalgia.  Since Tylenol seems to be working well she will continue.  I will place referral to orthopedics.  Time spent:22 minutes reviewing chart, interviewing and examining patient and formulating plan of care.     Chaya Jan, MD [facility]

## 2024-01-16 ENCOUNTER — Telehealth: Payer: Self-pay | Admitting: Family Medicine

## 2024-01-16 NOTE — Telephone Encounter (Signed)
 Copied from CRM [id number]. Topic: General - Other >> Jan 16, 2024 12:57 PM Florestine Avers wrote: Reason for CRM: Patient called in requesting a call back from Dr. Ardyth Harps. Patient was told to get an xray and she was unable too at the moment.

## 2024-01-19 NOTE — Telephone Encounter (Signed)
 Have her follow up with me if the hip pain does not improve

## 2024-01-19 NOTE — Telephone Encounter (Signed)
 Patient has stopped taking her medication for her cholesterol and her hip pain has improved.  But she would like to have an xray of her hip.  Okay to order?

## 2024-01-19 NOTE — Telephone Encounter (Signed)
 Spoke with pt states that she is better today, advised to call the office if pain does not improve.

## 2024-02-04 ENCOUNTER — Ambulatory Visit: Payer: Self-pay

## 2024-02-04 ENCOUNTER — Telehealth: Payer: Self-pay

## 2024-02-04 NOTE — Telephone Encounter (Signed)
 Copied from CRM [id number]. Topic: Clinical - Medication Question >> Feb 04, 2024  9:41 AM Antwanette L wrote: Reason for CRM: Patients is requesting a callback from [place]. Patient has some questions about rosuvastatin  (CRESTOR ) 10 MG. The patient can be contacted at [phone] or [phone]

## 2024-02-04 NOTE — Telephone Encounter (Signed)
 Copied from CRM [id number]. Topic: Clinical - Red Word Triage >> Feb 04, 2024  3:07 PM Shereese L wrote: [facility] that prompted transfer to Nurse Triage: swelling in right leg, severe pain   Chief Complaint: Right leg pain Symptoms: Right leg pain Frequency: Constant  Disposition: [] ED /[] Urgent Care (no appt availability in office) / [x] Appointment(In office/virtual)/ []  [facility]/ [] Home Care/ [] Refused Recommended Disposition /[] [facility]/ []  Follow-up with PCP Additional Notes: Patient states she was seen recently for right leg pain. She states that her pain has not gone away and is unsure of what to do for her pain. I discussed home care instructions with the patient and have scheduled an appointment on Friday for evaluation. Patient instructed to call back for new or worsening symptoms. Patient verbalized understanding and agreement with this plan    Reason for Disposition  [1] MODERATE pain (e.g., interferes with normal activities, limping) AND [2] present > 3 days  Answer Assessment - Initial Assessment Questions 1. ONSET: "When did the pain start?"      2 weeks 2. LOCATION: "Where is the pain located?"      Right leg  3. PAIN: "How bad is the pain?"    (Scale 1-10; or mild, moderate, severe)   -  MILD (1-3): doesn't interfere with normal activities    -  MODERATE (4-7): interferes with normal activities (e.g., work or school) or awakens from sleep, limping    -  SEVERE (8-10): excruciating pain, unable to do any normal activities, unable to walk     Mild to moderate 4. WORK OR EXERCISE: "Has there been any recent work or exercise that involved this part of the body?"      No 5. CAUSE: "What do you think is causing the leg pain?"     Believes due to Crestor  6. OTHER SYMPTOMS: "Do you have any other symptoms?" (e.g., chest pain, back pain, breathing difficulty, swelling, rash, fever, numbness, weakness)     Mild swelling  Protocols used: Leg  Pain-A-AH

## 2024-02-05 NOTE — Telephone Encounter (Signed)
 Noted.

## 2024-02-05 NOTE — Telephone Encounter (Signed)
 Attempted to call pt on both phone numbers listed mailbox full and no option to leave a message. Pt has f/u appointment tomorrow with Dr Alyne Babinski on 02/06/24 we will discuss this at the visit

## 2024-02-06 ENCOUNTER — Encounter: Payer: Self-pay | Admitting: Family Medicine

## 2024-02-06 ENCOUNTER — Ambulatory Visit

## 2024-02-06 ENCOUNTER — Ambulatory Visit
Admission: RE | Admit: 2024-02-06 | Discharge: 2024-02-06 | Disposition: A | Discharge status: Home / Self Care | Source: Ambulatory Visit | Attending: Family Medicine | Admitting: Family Medicine

## 2024-02-06 ENCOUNTER — Ambulatory Visit: Admitting: Family Medicine

## 2024-02-06 VITALS — BP 138/78 | HR 52 | Temp 98.5°F | Wt 161.8 lb

## 2024-02-06 DIAGNOSIS — Z1231 Encounter for screening mammogram for malignant neoplasm of breast: Secondary | ICD-10-CM | POA: Diagnosis not present

## 2024-02-06 DIAGNOSIS — M79604 Pain in right leg: Secondary | ICD-10-CM

## 2024-02-06 NOTE — Progress Notes (Signed)
   Subjective:    Patient ID: Anne [name], female    DOB: Apr 22, 1946, 78 y.o.   MRN: [id number]  HPI Here to follow up on right leg pain that started about 4 weeks ago. At our last visit we could not recall any recent trauma, but today she says that a week before this began she lost her balance stepping off a curb. She was able to catch herself before she fell, but she jerked her body around to some extent. She has been seen here on 12-26-23 and again on 01-07-24, and each time she describes the pain a little differently. At first the pain seemed to be centered in the anterior thigh, then it seemed to involve the hip primarily. Today she describes it as a sharp, sometimes severe, pain that begins in the right buttock and then radiates down the entire right leg to the foot. No back pain. No numbness or weakness in the leg. Sleeping with a pillow beneath her knees helps. Tylenol  helps as well. At her last visit a referral was placed to Orthopedics, but they were unable to reach her so the referral was cancelled.    Review of Systems  Constitutional: Negative.   Respiratory: Negative.    Cardiovascular: Negative.   Musculoskeletal:  Positive for myalgias. Negative for arthralgias and back pain.       Objective:   Physical Exam Constitutional:      General: She is not in acute distress.    Appearance: Normal appearance.     Comments: She is able to get up and down from the exam table without pain   Cardiovascular:     Rate and Rhythm: Normal rate and regular rhythm.     Pulses: Normal pulses.     Heart sounds: Normal heart sounds.  Pulmonary:     Effort: Pulmonary effort is normal.     Breath sounds: Normal breath sounds.  Musculoskeletal:     Comments: The lower back is not tender ,and the lumbar spine has full ROM. Both hips have full ROM wit no pain. The right thigh and groin are not tender   Neurological:     Mental Status: She is alert.           Assessment & Plan:  She  has right leg pain that sounds more like a nerve pain than a joint or muscle pain. She will take Tylenol  as needed. We will refer her to Sports Medicine.  Corita Diego, MD

## 2024-02-11 ENCOUNTER — Observation Stay (HOSPITAL_COMMUNITY)
Admission: EM | Admit: 2024-02-11 | Discharge: 2024-02-14 | Disposition: A | Discharge status: Home / Self Care | Attending: Internal Medicine | Admitting: Internal Medicine

## 2024-02-11 ENCOUNTER — Other Ambulatory Visit: Payer: Self-pay

## 2024-02-11 DIAGNOSIS — Z7982 Long term (current) use of aspirin: Secondary | ICD-10-CM | POA: Insufficient documentation

## 2024-02-11 DIAGNOSIS — R42 Dizziness and giddiness: Principal | ICD-10-CM

## 2024-02-11 DIAGNOSIS — K219 Gastro-esophageal reflux disease without esophagitis: Secondary | ICD-10-CM | POA: Diagnosis not present

## 2024-02-11 DIAGNOSIS — R531 Weakness: Secondary | ICD-10-CM | POA: Diagnosis not present

## 2024-02-11 DIAGNOSIS — I2699 Other pulmonary embolism without acute cor pulmonale: Secondary | ICD-10-CM | POA: Diagnosis not present

## 2024-02-11 DIAGNOSIS — Z91040 Latex allergy status: Secondary | ICD-10-CM | POA: Diagnosis not present

## 2024-02-11 DIAGNOSIS — Z79899 Other long term (current) drug therapy: Secondary | ICD-10-CM | POA: Diagnosis not present

## 2024-02-11 DIAGNOSIS — R1111 Vomiting without nausea: Secondary | ICD-10-CM | POA: Diagnosis not present

## 2024-02-11 DIAGNOSIS — E876 Hypokalemia: Secondary | ICD-10-CM | POA: Diagnosis present

## 2024-02-11 DIAGNOSIS — R110 Nausea: Secondary | ICD-10-CM | POA: Diagnosis not present

## 2024-02-11 DIAGNOSIS — E785 Hyperlipidemia, unspecified: Secondary | ICD-10-CM | POA: Diagnosis not present

## 2024-02-11 DIAGNOSIS — I6521 Occlusion and stenosis of right carotid artery: Secondary | ICD-10-CM | POA: Diagnosis not present

## 2024-02-11 DIAGNOSIS — I10 Essential (primary) hypertension: Secondary | ICD-10-CM | POA: Diagnosis present

## 2024-02-11 DIAGNOSIS — R0989 Other specified symptoms and signs involving the circulatory and respiratory systems: Secondary | ICD-10-CM | POA: Diagnosis not present

## 2024-02-11 DIAGNOSIS — E042 Nontoxic multinodular goiter: Secondary | ICD-10-CM | POA: Diagnosis not present

## 2024-02-11 DIAGNOSIS — I2510 Atherosclerotic heart disease of native coronary artery without angina pectoris: Secondary | ICD-10-CM | POA: Diagnosis not present

## 2024-02-11 DIAGNOSIS — Z8673 Personal history of transient ischemic attack (TIA), and cerebral infarction without residual deficits: Secondary | ICD-10-CM | POA: Insufficient documentation

## 2024-02-11 DIAGNOSIS — I6782 Cerebral ischemia: Secondary | ICD-10-CM | POA: Diagnosis not present

## 2024-02-11 DIAGNOSIS — J9811 Atelectasis: Secondary | ICD-10-CM | POA: Diagnosis not present

## 2024-02-11 DIAGNOSIS — R112 Nausea with vomiting, unspecified: Secondary | ICD-10-CM | POA: Diagnosis present

## 2024-02-11 DIAGNOSIS — R9089 Other abnormal findings on diagnostic imaging of central nervous system: Secondary | ICD-10-CM | POA: Diagnosis not present

## 2024-02-11 LAB — COMPREHENSIVE METABOLIC PANEL WITH GFR
ALT: 25 U/L (ref 0–44)
AST: 31 U/L (ref 15–41)
Albumin: 4.1 g/dL (ref 3.5–5.0)
Alkaline Phosphatase: 80 U/L (ref 38–126)
Anion gap: 12 (ref 5–15)
BUN: 13 mg/dL (ref 8–23)
CO2: 26 mmol/L (ref 22–32)
Calcium: 9.1 mg/dL (ref 8.9–10.3)
Chloride: 98 mmol/L (ref 98–111)
Creatinine, Ser: 0.55 mg/dL (ref 0.44–1.00)
GFR, Estimated: >60 mL/min (ref >60)
Glucose, Bld: 144 mg/dL — ABNORMAL HIGH (ref 70–99)
Potassium: 3.1 mmol/L — ABNORMAL LOW (ref 3.5–5.1)
Sodium: 136 mmol/L (ref 135–145)
Total Bilirubin: 0.7 mg/dL (ref 0.0–1.2)
Total Protein: 8.1 g/dL (ref 6.5–8.1)

## 2024-02-11 LAB — CBC
HCT: 43.2 % (ref 36.0–46.0)
Hemoglobin: 14.5 g/dL (ref 12.0–15.0)
MCH: 30.2 pg (ref 26.0–34.0)
MCHC: 33.6 g/dL (ref 30.0–36.0)
MCV: 90 fL (ref 80.0–100.0)
Platelets: 439 10*3/uL — ABNORMAL HIGH (ref 150–400)
RBC: 4.8 MIL/uL (ref 3.87–5.11)
RDW: 12.8 % (ref 11.5–15.5)
WBC: 14.1 10*3/uL — ABNORMAL HIGH (ref 4.0–10.5)
nRBC: 0 % (ref 0.0–0.2)

## 2024-02-11 LAB — URINALYSIS, ROUTINE W REFLEX MICROSCOPIC
Bacteria, UA: NONE SEEN
Bilirubin Urine: NEGATIVE
Glucose, UA: NEGATIVE mg/dL
Hgb urine dipstick: NEGATIVE
Ketones, ur: 20 mg/dL — AB
Leukocytes,Ua: NEGATIVE
Nitrite: NEGATIVE
Protein, ur: 100 mg/dL — AB
Specific Gravity, Urine: 1.014 (ref 1.005–1.030)
pH: 7 (ref 5.0–8.0)

## 2024-02-11 LAB — LIPASE, BLOOD: Lipase: 26 U/L (ref 11–51)

## 2024-02-11 MED ORDER — ONDANSETRON HCL 4 MG/2ML IJ SOLN
4.0000 mg | Freq: Once | INTRAMUSCULAR | Status: AC
  Administered 2024-02-12: 4 mg via INTRAVENOUS
  Filled 2024-02-11: qty 2

## 2024-02-11 MED ORDER — POTASSIUM CHLORIDE CRYS ER 20 MEQ PO TBCR
40.0000 meq | EXTENDED_RELEASE_TABLET | Freq: Once | ORAL | Status: AC
  Administered 2024-02-12: 40 meq via ORAL
  Filled 2024-02-11: qty 2

## 2024-02-11 MED ORDER — MECLIZINE HCL 25 MG PO TABS
25.0000 mg | ORAL_TABLET | Freq: Once | ORAL | Status: AC
  Administered 2024-02-12: 25 mg via ORAL
  Filled 2024-02-11: qty 1

## 2024-02-11 NOTE — ED Notes (Signed)
 Attempted IV access x 2 without success.

## 2024-02-11 NOTE — ED Provider Triage Note (Signed)
 Emergency Medicine Provider Triage Evaluation Note  Anne [name] , a 78 y.o. female  was evaluated in triage.  Pt complains of emesis and dizziness.  About 7 hours ago.  Endorses room spinning sensation with the dizziness.  Vomiting has been persistent.  Denies abdominal pain.  Also denies shortness of breath, chest pain or palpitations.  Review of Systems  Positive: See above Negative: See above  Physical Exam  BP 136/70 (BP Location: Right Arm)   Pulse 70   Temp 97.8 F (36.6 C) (Oral)   Resp 16   SpO2 98%  Gen:   Awake, no distress   Resp:  Normal effort  MSK:   Moves extremities without difficulty  Other:    Medical Decision Making  Medically screening exam initiated at 5:24 PM.  Appropriate orders placed.  ELEESA VANAKEN was informed that the remainder of the evaluation will be completed by another provider, this initial triage assessment does not replace that evaluation, and the importance of remaining in the ED until their evaluation is complete.  Work up started   Janalee Mcmurray, PA-C 02/11/24 1725

## 2024-02-11 NOTE — ED Provider Notes (Signed)
 [place] EMERGENCY DEPARTMENT AT [facility] Provider Note   CSN: [id number] Arrival date & time: 02/11/24  1659     History  Chief Complaint  Patient presents with   Emesis    Pt arrives via ems today for emesis and dizziness that began approx 7hrs ago. Denies fever, chills, and diarrhea. Headache ongoing for 1 week, no chest pain or urinary symptoms. BG 129, R18, 97.65F, 94%RA, HR78, 158/85    Anne [name] is a 78 y.o. female.  Patient is a 78 year old female with a past medical history of hypertension and TIA presenting to the emergency department with dizziness and nausea.  Patient states that she woke up this morning feeling a little bit queasy and unwell.  She states around 11 AM she suddenly felt room spinning dizziness.  She states she had worsening nausea and vomiting and has been dry heaving throughout the day.  She states that her dizziness gets worse anytime she tries to move or get up.  She denies any associated vision changes, numbness or weakness.  States that she does have a mild headache.  States that she has had similar symptoms that she has been seen here in the past but they were not as severe.  The history is provided by the patient.  Emesis      Home Medications Prior to Admission medications   Medication Sig Start Date End Date Taking? Authorizing Provider  acetaminophen  (TYLENOL ) 325 MG tablet Take 650 mg by mouth every 6 (six) hours as needed for mild pain.    [provider]  amLODipine  (NORVASC ) 10 MG tablet Take 1 tablet (10 mg total) by mouth daily. 09/08/23   Nahser, Lela Purple, MD  aspirin 81 MG tablet Take 81 mg by mouth daily.    [provider]  benzonatate  (TESSALON ) 100 MG capsule Take 1 capsule (100 mg total) by mouth 3 (three) times daily as needed for cough. 05/26/22   Banister, Pamela K, MD  Calcium -Vitamin D (CALTRATE 600 PLUS-VIT D PO) Take 1 tablet by mouth daily.    [provider]  COVID-19 mRNA  vaccine, Pfizer, (COMIRNATY ) syringe Inject into the muscle. 07/22/23   Liane Redman, MD  ezetimibe  (ZETIA ) 10 MG tablet Take 1 tablet (10 mg total) by mouth daily. Patient not taking: Reported on 02/06/2024 09/18/23   Nahser, Lela Purple, MD  hydrochlorothiazide  (HYDRODIURIL ) 25 MG tablet TAKE 1 TABLET BY MOUTH EVERY DAY 01/01/24   Donley Furth, MD  influenza vaccine adjuvanted (FLUAD) 0.5 ML injection Inject into the muscle. 07/22/23   Liane Redman, MD  ketoconazole  (NIZORAL ) 2 % shampoo Apply 1 Application topically 2 (two) times a week. 08/04/23   Donley Furth, MD  KLOR-CON  M20 20 MEQ tablet TAKE 1 TABLET BY MOUTH EVERY DAY 09/22/23   Donley Furth, MD  loratadine  (CLARITIN ) 10 MG tablet Take 1 tablet (10 mg total) by mouth daily. 01/31/21   Donley Furth, MD  losartan  (COZAAR ) 50 MG tablet Take 1 tablet (50 mg total) by mouth daily. 09/08/23   Nahser, Lela Purple, MD  meclizine  (ANTIVERT ) 25 MG tablet Take 1 tablet (25 mg total) by mouth every other day as needed for dizziness. 12/01/23   Donley Furth, MD  Multiple Vitamins-Minerals (CENTRUM SILVER PO) Take 1 tablet by mouth daily.    [provider]  omeprazole  (PRILOSEC) 40 MG capsule Take 1 capsule (40 mg total) by mouth daily. 11/06/20   Donley Furth, MD  ondansetron  (  ZOFRAN -ODT) 4 MG disintegrating tablet Take 1 tablet (4 mg total) by mouth every 8 (eight) hours as needed for nausea or vomiting. 05/26/22   Ann Keto, MD  rosuvastatin  (CRESTOR ) 10 MG tablet Take 1 tablet (10 mg total) by mouth daily. 09/18/23   Nahser, Lela Purple, MD  vitamin C (ASCORBIC ACID) 500 MG tablet Take 500 mg by mouth daily.    [provider]      Allergies    Iodine, Latex, and Shellfish allergy    Review of Systems   Review of Systems  Gastrointestinal:  Positive for vomiting.    Physical Exam Updated Vital Signs BP (!) 155/93 (BP Location: Left Arm)   Pulse 74   Temp 98.1 F (36.7 C) (Oral)   Resp 16   SpO2 98%  Physical  Exam Vitals and nursing note reviewed.  Constitutional:      General: She is not in acute distress.    Appearance: Normal appearance.  HENT:     Head: Normocephalic and atraumatic.     Nose: Nose normal.     Mouth/Throat:     Mouth: Mucous membranes are dry.     Pharynx: Oropharynx is clear.  Eyes:     Extraocular Movements: Extraocular movements intact.     Conjunctiva/sclera: Conjunctivae normal.     Pupils: Pupils are equal, round, and reactive to light.     Comments: Left-sided and vertical nystagmus on exam  Cardiovascular:     Rate and Rhythm: Normal rate and regular rhythm.     Heart sounds: Normal heart sounds.  Pulmonary:     Effort: Pulmonary effort is normal.     Breath sounds: Normal breath sounds.  Abdominal:     General: Abdomen is flat.     Palpations: Abdomen is soft.     Tenderness: There is no abdominal tenderness.  Musculoskeletal:        General: Normal range of motion.     Cervical back: Normal range of motion.  Skin:    General: Skin is warm and dry.  Neurological:     General: No focal deficit present.     Mental Status: She is alert and oriented to person, place, and time.     Cranial Nerves: No cranial nerve deficit.     Sensory: No sensory deficit.     Motor: No weakness.     Coordination: Coordination normal.  Psychiatric:        Mood and Affect: Mood normal.        Behavior: Behavior normal.     ED Results / Procedures / Treatments   Labs (all labs ordered are listed, but only abnormal results are displayed) Labs Reviewed  COMPREHENSIVE METABOLIC PANEL WITH GFR - Abnormal; Notable for the following components:      Result Value   Potassium 3.1 (*)    Glucose, Bld 144 (*)    All other components within normal limits  CBC - Abnormal; Notable for the following components:   WBC 14.1 (*)    Platelets 439 (*)    All other components within normal limits  URINALYSIS, ROUTINE W REFLEX MICROSCOPIC - Abnormal; Notable for the following  components:   APPearance HAZY (*)    Ketones, ur 20 (*)    Protein, ur 100 (*)    All other components within normal limits  LIPASE, BLOOD    EKG EKG Interpretation Date/Time:  Wednesday February 11 2024 17:36:10 EDT Ventricular Rate:  75 PR Interval:  168  QRS Duration:  108 QT Interval:  406 QTC Calculation: 454 R Axis:   -53  Text Interpretation: Sinus arrhythmia Consider left atrial enlargement Incomplete RBBB and LAFB RSR' in V1 or V2, right VCD or RVH LVH with secondary repolarization abnormality Duplicate EKG Confirmed by Celesta Coke ([id number]) on 02/11/2024 9:05:47 PM  Radiology No results found.  Procedures Procedures    Medications Ordered in ED Medications  potassium chloride  SA (KLOR-CON  M) CR tablet 40 mEq (has no administration in time range)  ondansetron  (ZOFRAN ) injection 4 mg (has no administration in time range)  meclizine  (ANTIVERT ) tablet 25 mg (has no administration in time range)    ED Course/ Medical Decision Making/ A&P Clinical Course as of 02/12/24 0020  Thu Feb 12, 2024  0019 Patient signed out to Dr. Carylon Claude pending CTA and reassessment after meds. [VK]    Clinical Course User Index [VK] Kingsley, Zyad Boomer K, DO                                 Medical Decision Making This patient presents to the ED with chief complaint(s) of dizziness, nausea with pertinent past medical history of hypertension, TIA which further complicates the presenting complaint. The complaint involves an extensive differential diagnosis and also carries with it a high risk of complications and morbidity.    The differential diagnosis includes central versus peripheral vertigo, arrhythmia, anemia, dehydration, electrolyte abnormality  Additional history obtained: Additional history obtained from N/A Records reviewed Primary Care Documents and prior ED records  ED Course and Reassessment: On patient's arrival she was hemodynamically stable in no acute distress.  Was  initially evaluated in triage and had EKG and labs performed.  EKG showed normal sinus rhythm without acute ischemic changes, labs showed mild hypokalemia will be repleted and mild leukocytosis.  Suspect this is reactionary from her vomiting as she has no other infectious symptoms on exam.  Exam does appear consistent with vertigo and with her horizontal nystagmus and prior history of TIA will have a CTA of her head and neck today.  Did have a recent negative MRI in February.  Will be treated with meclizine  and Zofran  and will be closely reassessed.  Independent labs interpretation:  The following labs were independently interpreted: mild hypokalemia, otherwise within normal range  Independent visualization of imaging: - Pending    Amount and/or Complexity of Data Reviewed Labs: ordered. Radiology: ordered.  Risk Prescription drug management.          Final Clinical Impression(s) / ED Diagnoses Final diagnoses:  None    Rx / DC Orders ED Discharge Orders     None         Kingsley, Meghin Thivierge K, DO 02/12/24 0020

## 2024-02-11 NOTE — Progress Notes (Deleted)
   Joanna Muck, PhD, LAT, ATC acting as a scribe for Garlan Juniper, MD.  Anne [name] is a 78 y.o. female who presents to [facility] at [facility] today for R leg pain ongoing for over a month. She is unsure about injury, but recalls losing her balance and stepping off of a curb oddly. Pt locates pain to ***  Low back pain: Radiating pain: LE numbness/tingling: LE weakness: Aggravates: Treatments tried: Tylenol , sleeping w/ a pillow under her legs  Pertinent review of systems: ***  Relevant historical information: ***   Exam:  There were no vitals taken for this visit. General: Well Developed, well nourished, and in no acute distress.   MSK: ***    Lab and Radiology Results No results found for this or any previous visit (from the past 72 hours). No results found.     Assessment and Plan: 78 y.o. female with ***   PDMP not reviewed this encounter. No orders of the defined types were placed in this encounter.  No orders of the defined types were placed in this encounter.    Discussed warning signs or symptoms. Please see discharge instructions. Patient expresses understanding.   ***

## 2024-02-12 ENCOUNTER — Observation Stay (HOSPITAL_BASED_OUTPATIENT_CLINIC_OR_DEPARTMENT_OTHER)

## 2024-02-12 ENCOUNTER — Ambulatory Visit: Admitting: Family Medicine

## 2024-02-12 ENCOUNTER — Encounter (HOSPITAL_COMMUNITY): Payer: Self-pay | Admitting: Internal Medicine

## 2024-02-12 ENCOUNTER — Emergency Department (HOSPITAL_COMMUNITY)

## 2024-02-12 DIAGNOSIS — I2510 Atherosclerotic heart disease of native coronary artery without angina pectoris: Secondary | ICD-10-CM | POA: Diagnosis not present

## 2024-02-12 DIAGNOSIS — R0989 Other specified symptoms and signs involving the circulatory and respiratory systems: Secondary | ICD-10-CM | POA: Diagnosis not present

## 2024-02-12 DIAGNOSIS — J9811 Atelectasis: Secondary | ICD-10-CM | POA: Diagnosis not present

## 2024-02-12 DIAGNOSIS — R9089 Other abnormal findings on diagnostic imaging of central nervous system: Secondary | ICD-10-CM | POA: Diagnosis not present

## 2024-02-12 DIAGNOSIS — I6521 Occlusion and stenosis of right carotid artery: Secondary | ICD-10-CM | POA: Diagnosis not present

## 2024-02-12 DIAGNOSIS — E042 Nontoxic multinodular goiter: Secondary | ICD-10-CM | POA: Diagnosis not present

## 2024-02-12 DIAGNOSIS — E876 Hypokalemia: Secondary | ICD-10-CM | POA: Diagnosis present

## 2024-02-12 DIAGNOSIS — I6782 Cerebral ischemia: Secondary | ICD-10-CM | POA: Diagnosis not present

## 2024-02-12 DIAGNOSIS — I2699 Other pulmonary embolism without acute cor pulmonale: Principal | ICD-10-CM

## 2024-02-12 DIAGNOSIS — I2602 Saddle embolus of pulmonary artery with acute cor pulmonale: Secondary | ICD-10-CM

## 2024-02-12 LAB — ECHOCARDIOGRAM COMPLETE
AR max vel: 1.46 cm2
AV Area VTI: 1.35 cm2
AV Area mean vel: 1.4 cm2
AV Mean grad: 17 mmHg
AV Peak grad: 29.7 mmHg
Ao pk vel: 2.72 m/s
Area-P 1/2: 3.17 cm2
Calc EF: 65.6 %
Height: 65 in
MV VTI: 2.13 cm2
P 1/2 time: 1100 ms
S' Lateral: 3.1 cm
Single Plane A2C EF: 70.1 %
Single Plane A4C EF: 60.5 %
Weight: 2592 [oz_av]

## 2024-02-12 LAB — CBC
HCT: 41.9 % (ref 36.0–46.0)
Hemoglobin: 13.4 g/dL (ref 12.0–15.0)
MCH: 30.3 pg (ref 26.0–34.0)
MCHC: 32 g/dL (ref 30.0–36.0)
MCV: 94.8 fL (ref 80.0–100.0)
Platelets: 392 10*3/uL (ref 150–400)
RBC: 4.42 MIL/uL (ref 3.87–5.11)
RDW: 13.1 % (ref 11.5–15.5)
WBC: 12.3 10*3/uL — ABNORMAL HIGH (ref 4.0–10.5)
nRBC: 0 % (ref 0.0–0.2)

## 2024-02-12 LAB — BASIC METABOLIC PANEL WITH GFR
Anion gap: 11 (ref 5–15)
BUN: 15 mg/dL (ref 8–23)
CO2: 24 mmol/L (ref 22–32)
Calcium: 8.7 mg/dL — ABNORMAL LOW (ref 8.9–10.3)
Chloride: 99 mmol/L (ref 98–111)
Creatinine, Ser: 1.01 mg/dL — ABNORMAL HIGH (ref 0.44–1.00)
GFR, Estimated: 58 mL/min — ABNORMAL LOW (ref >60)
Glucose, Bld: 96 mg/dL (ref 70–99)
Potassium: 3.3 mmol/L — ABNORMAL LOW (ref 3.5–5.1)
Sodium: 134 mmol/L — ABNORMAL LOW (ref 135–145)

## 2024-02-12 LAB — HEPARIN LEVEL (UNFRACTIONATED)
Heparin Unfractionated: 0.36 [IU]/mL (ref 0.30–0.70)
Heparin Unfractionated: 0.55 [IU]/mL (ref 0.30–0.70)

## 2024-02-12 LAB — MAGNESIUM: Magnesium: 2.2 mg/dL (ref 1.7–2.4)

## 2024-02-12 LAB — PHOSPHORUS: Phosphorus: 3 mg/dL (ref 2.5–4.6)

## 2024-02-12 MED ORDER — ACETAMINOPHEN 650 MG RE SUPP: 650.0000 mg | Freq: Four times a day (QID) | RECTAL | Status: DC | PRN

## 2024-02-12 MED ORDER — SODIUM CHLORIDE (PF) 0.9 % IJ SOLN
INTRAMUSCULAR | Status: AC
  Filled 2024-02-12: qty 50

## 2024-02-12 MED ORDER — MECLIZINE HCL 25 MG PO TABS
25.0000 mg | ORAL_TABLET | ORAL | Status: DC | PRN
Start: 2024-02-12 — End: 2024-02-12

## 2024-02-12 MED ORDER — ACETAMINOPHEN 325 MG PO TABS: 650.0000 mg | ORAL_TABLET | Freq: Four times a day (QID) | ORAL | Status: DC | PRN

## 2024-02-12 MED ORDER — IOHEXOL 350 MG/ML SOLN
75.0000 mL | Freq: Once | INTRAVENOUS | Status: AC | PRN
  Administered 2024-02-12: 75 mL via INTRAVENOUS

## 2024-02-12 MED ORDER — EZETIMIBE 10 MG PO TABS
10.0000 mg | ORAL_TABLET | Freq: Every day | ORAL | Status: DC
  Administered 2024-02-12 – 2024-02-14 (×3): 10 mg via ORAL
  Filled 2024-02-12 (×3): qty 1

## 2024-02-12 MED ORDER — PNEUMOCOCCAL 20-VAL CONJ VACC 0.5 ML IM SUSY
0.5000 mL | PREFILLED_SYRINGE | INTRAMUSCULAR | Status: DC
  Filled 2024-02-12: qty 0.5

## 2024-02-12 MED ORDER — LORATADINE 10 MG PO TABS: 10.0000 mg | ORAL_TABLET | Freq: Every day | ORAL | Status: DC | PRN

## 2024-02-12 MED ORDER — LOSARTAN POTASSIUM 50 MG PO TABS
50.0000 mg | ORAL_TABLET | Freq: Every day | ORAL | Status: DC
  Administered 2024-02-12 – 2024-02-14 (×3): 50 mg via ORAL
  Filled 2024-02-12 (×3): qty 1

## 2024-02-12 MED ORDER — AMLODIPINE BESYLATE 10 MG PO TABS
10.0000 mg | ORAL_TABLET | Freq: Every day | ORAL | Status: DC
  Administered 2024-02-12 – 2024-02-14 (×3): 10 mg via ORAL
  Filled 2024-02-12 (×2): qty 1
  Filled 2024-02-12: qty 2

## 2024-02-12 MED ORDER — MECLIZINE HCL 25 MG PO TABS
25.0000 mg | ORAL_TABLET | Freq: Three times a day (TID) | ORAL | Status: DC | PRN
  Administered 2024-02-13: 25 mg via ORAL
  Filled 2024-02-12: qty 1

## 2024-02-12 MED ORDER — IOHEXOL 350 MG/ML SOLN
75.0000 mL | Freq: Once | INTRAVENOUS | Status: AC | PRN
Start: 2024-02-12 — End: 2024-02-12
  Administered 2024-02-12: 75 mL via INTRAVENOUS

## 2024-02-12 MED ORDER — ROSUVASTATIN CALCIUM 10 MG PO TABS
20.0000 mg | ORAL_TABLET | Freq: Every day | ORAL | Status: DC
  Administered 2024-02-12 – 2024-02-14 (×3): 20 mg via ORAL
  Filled 2024-02-12: qty 2
  Filled 2024-02-12: qty 1
  Filled 2024-02-12: qty 2

## 2024-02-12 MED ORDER — POTASSIUM CHLORIDE CRYS ER 20 MEQ PO TBCR
20.0000 meq | EXTENDED_RELEASE_TABLET | Freq: Two times a day (BID) | ORAL | Status: DC
  Administered 2024-02-12 – 2024-02-14 (×5): 20 meq via ORAL
  Filled 2024-02-12 (×5): qty 1

## 2024-02-12 MED ORDER — HEPARIN (PORCINE) 25000 UT/250ML-% IV SOLN
1050.0000 [IU]/h | INTRAVENOUS | Status: DC
  Administered 2024-02-12 – 2024-02-13 (×2): 1150 [IU]/h via INTRAVENOUS
  Administered 2024-02-14: 1050 [IU]/h via INTRAVENOUS
  Filled 2024-02-12 (×3): qty 250

## 2024-02-12 MED ORDER — HYDROCHLOROTHIAZIDE 25 MG PO TABS
25.0000 mg | ORAL_TABLET | Freq: Every day | ORAL | Status: DC
  Administered 2024-02-12 – 2024-02-14 (×3): 25 mg via ORAL
  Filled 2024-02-12 (×3): qty 1

## 2024-02-12 MED ORDER — HEPARIN BOLUS VIA INFUSION
5000.0000 [IU] | Freq: Once | INTRAVENOUS | Status: AC
  Administered 2024-02-12: 5000 [IU] via INTRAVENOUS
  Filled 2024-02-12: qty 5000

## 2024-02-12 NOTE — Plan of Care (Signed)

## 2024-02-12 NOTE — ED Notes (Signed)
 [place] PIV placed.

## 2024-02-12 NOTE — ED Provider Notes (Signed)
 Patient signed out pending CTA head and neck.  CTA shows extensive arthrosclerotic disease but no culprit vessels or concerning acute finding.  However, she does have what appears to be PEs.  Recommend CT PE study.  Unfortunately this took most of the evening to obtain.  CT PE study is positive for right upper lobe PE as well as nonocclusive thrombus on the left.  Heparin  was initiated.  On recheck, patient is hemodynamically stable.  She does report some leg pain recently.  Will likely need lower extremity Dopplers.  Discussed with her the plan of care to admit given PEs.  Patient is agreeable to plan.   Rory Collard, MD 02/12/24 [phone]

## 2024-02-12 NOTE — ED Notes (Addendum)
 Needing new IV placed for scans. Waiting for US  IV to be placed

## 2024-02-12 NOTE — H&P (Addendum)
 History and Physical    Patient: Anne [name]:[id number] DOB: April 07, 1946 DOA: 02/11/2024 DOS: the patient was seen and examined on 02/12/2024 PCP: Anne Furth, MD  Patient coming from: Home  Chief Complaint:  Chief Complaint  Patient presents with   Emesis    Pt arrives via ems today for emesis and dizziness that began approx 7hrs ago. Denies fever, chills, and diarrhea. Headache ongoing for 1 week, no chest pain or urinary symptoms. BG 129, R18, 97.102F, 94%RA, HR78, 158/85   HPI: Anne [name] is a 78 y.o. 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  Incidentally, the patient was found to have a pulmonary embolus in the emergency department.  She has not travel, but given her medical history and age is sedentary.  She had 1 sister that has a history of miscarriages and her mother had a "blood clot" on unspecified area.  No extended family members to her knowledge with hypercoagulation issues.  Recently, she was having bilateral calf swelling and tenderness that was more intense on the right.  Denied fever, chills, rhinorrhea, sore throat, wheezing or hemoptysis.  No chest pain, palpitations, diaphoresis, PND, orthopnea, but has occasional pitting edema of the lower extremities.  No abdominal pain, nausea, emesis, diarrhea, constipation, melena or hematochezia.  No flank pain, dysuria, frequency or hematuria.  No polyuria, polydipsia, polyphagia or blurred vision.   Lab work: Urinalysis is hazy with ketones of 20 and protein of 100 mg/dL.  CBC showed a white count of 14.1, hemoglobin 14.5 g/dL  and platelets 811.  CMP showed a potassium of 3.1 mmol/L and glucose 144 mg/dL, but the rest of the CMP measurements were normal.  Lipase was 26.  Imaging: CTA head and neck with no acute intracranial normality.  There was mild cerebral atrophy with chronic microvascular ischemic disease.  It was negative for large vessel occlusion or other emergent finding.  Extensive intracranial atherosclerotic disease, diffuse tortuosity of the major arterial vasculature of the head and neck suggesting chronic underlying hypertension.  Suspected PE in left upper lobe segmental pulmonary artery.  Diffuse osteopenia.  Aortic atherosclerosis.  CTA chest with right upper lobe segmental PE, and more nonocclusive appearing thrombus in the left lower lobe superior segment.  However the left upper lobe PE seen earlier has resolved.  No central or saddle embolus.  No pulmonary infarct or pleural effusion.  Lower lung volumes with patchy atelectasis.  Calcified coronary artery.  ED course: Initial vital signs were temperature 97.8 F, pulse 70, respirations 16, BP 136/70 mmHg O2 sat 98% on room air.  The patient received heparin  5000 units SQ, meclizine  25 mg p.o., ondansetron  4 mg IVP and KCl 40 mEq p.o.  Review of Systems: As mentioned in the history of present illness. All other systems reviewed and are negative.  Past Medical History:  Diagnosis Date   Hyperlipidemia    Hypertension    Osteopenia    per DEXA on 07-19-09   Stroke (HCC) 09-2007   has mild left leg weakness, saw Dr. Albertina [name]   No past surgical history on file. Social History:  reports that she has never smoked. She has never used smokeless tobacco. She  reports that she does not currently use alcohol. She reports that she does not use drugs.  Allergies  Allergen Reactions   Iodine Itching and Other (See Comments)    "lump in tongue when eats seafood"   Latex Itching    GLOVES   Shellfish Allergy Swelling    Family History  Family history  unknown: Yes    Prior to Admission medications   Medication Sig Start Date End Date Taking? Authorizing Provider  acetaminophen  (TYLENOL ) 325 MG tablet Take 650 mg by mouth every 6 (six) hours as needed for mild pain.   Yes [provider]  amLODipine  (NORVASC ) 10 MG tablet Take 1 tablet (10 mg total) by mouth daily. 09/08/23  Yes [name], Anne Purple, MD  Calcium -Vitamin D (CALTRATE 600 PLUS-VIT D PO) Take 1 tablet by mouth daily.   Yes [provider]  ezetimibe  (ZETIA ) 10 MG tablet Take 1 tablet (10 mg total) by mouth daily. 09/18/23  Yes [name], Anne Purple, MD  hydrochlorothiazide  (HYDRODIURIL ) 25 MG tablet TAKE 1 TABLET BY MOUTH EVERY DAY 01/01/24  Yes Anne Furth, MD  ketoconazole  (NIZORAL ) 2 % shampoo Apply 1 Application topically 2 (two) times a week. 08/04/23  Yes Anne Furth, MD  KLOR-CON  M20 20 MEQ tablet TAKE 1 TABLET BY MOUTH EVERY DAY 09/22/23  Yes Anne Furth, MD  loratadine  (CLARITIN ) 10 MG tablet Take 1 tablet (10 mg total) by mouth daily. 01/31/21  Yes Anne Furth, MD  losartan  (COZAAR ) 50 MG tablet Take 1 tablet (50 mg total) by mouth daily. 09/08/23  Yes [name], Anne Purple, MD  meclizine  (ANTIVERT ) 25 MG tablet Take 1 tablet (25 mg total) by mouth every other day as needed for dizziness. 12/01/23  Yes Anne Furth, MD  Multiple Vitamins-Minerals (CENTRUM SILVER PO) Take 1 tablet by mouth daily.   Yes [provider]  ondansetron  (ZOFRAN -ODT) 4 MG disintegrating tablet Take 1 tablet (4 mg total) by mouth every 8 (eight) hours as needed for nausea or vomiting. 05/26/22  Yes [name], Anne Boatman, MD  rosuvastatin  (CRESTOR ) 10 MG tablet Take 1 tablet (10 mg total) by mouth daily. Patient taking differently: Take 20 mg by mouth daily. 09/18/23  Yes [name], Anne Purple, MD  vitamin C (ASCORBIC ACID) 500 MG tablet Take 500 mg by mouth daily.   Yes [provider]  aspirin 81 MG tablet Take 81 mg by mouth daily. Patient not taking: Reported on 02/12/2024     [provider]  influenza vaccine adjuvanted (FLUAD) 0.5 ML injection Inject into the muscle. 07/22/23   Anne Redman, MD    Physical Exam: Vitals:   02/12/24 0200 02/12/24 0335 02/12/24 0425 02/12/24 0530  BP: (!) 146/86  (!) 146/72 123/69  Pulse: 72  63 60  Resp:   18 16  Temp:   98.3 F (36.8 C)   TempSrc:      SpO2: 94%  97% 96%  Weight:  73.5 kg    Height:  5\' 5"  (1.651 m)     Physical Exam Vitals and nursing note reviewed.  Constitutional:      General: She is awake. She is not in acute distress.    Appearance: She is ill-appearing.  HENT:     Head: Normocephalic.     Nose: No rhinorrhea.     Mouth/Throat:     Mouth: Mucous membranes are moist.  Eyes:     General: No scleral icterus.    Pupils: Pupils are equal, round,  and reactive to light.  Neck:     Vascular: No JVD.  Cardiovascular:     Rate and Rhythm: Normal rate and regular rhythm.     Heart sounds: S1 normal and S2 normal.  Pulmonary:     Effort: Pulmonary effort is normal.     Breath sounds: Normal breath sounds. No wheezing, rhonchi or rales.  Abdominal:     General: Bowel sounds are normal. There is no distension.     Palpations: Abdomen is soft.     Tenderness: There is no abdominal tenderness. There is no guarding.  Musculoskeletal:     Cervical back: Neck supple.     Right lower leg: No edema.     Left lower leg: No edema.  Skin:    General: Skin is warm and dry.  Neurological:     General: No focal deficit present.     Mental Status: She is alert and oriented to person, place, and time.  Psychiatric:        Mood and Affect: Mood normal.        Behavior: Behavior normal. Behavior is cooperative.     Data Reviewed:  Results are pending, will review when available. EKG: Vent. rate 75 BPM PR interval 168 ms QRS duration 108 ms QT/QTcB 406/454 ms P-R-T axes 63 -53 78 Sinus arrhythmia Consider left atrial enlargement Incomplete RBBB and LAFB RSR' in V1 or V2, right VCD  or RVH LVH with secondary repolarization abnormality  Assessment and Plan: Principal Problem:   Acute pulmonary embolism (HCC) Admit to telemetry/inpatient. As needed supplemental oxygen. Continue heparin  per pharmacy. Check transthoracic echocardiogram. Check bilateral lower extremity venous Doppler. Will switch to DOAC prior to discharge. Follow-up CBC and chemistry in the morning.  Active Problems:   Hyperlipidemia Continue ezetimibe  10 mg p.o. daily. Continue rosuvastatin  20 mg p.o. daily.    Essential hypertension Continue amlodipine  10 mg p.o. daily. Continue hydrochlorothiazide  25 mg p.o. daily. Continue losartan  50 mg p.o. daily.    GERD (gastroesophageal reflux disease) Antiacid, H2 blocker or PPI as needed.    Vertigo Feels much better now. Continue meclizine  25 mg TID as needed.    Hypokalemia Supplementing. Continue potassium 20 mEq p.o. daily. Follow-up potassium level.    Advance Care Planning:   Code Status: Full Code   Consults:   Family Communication:   Severity of Illness: The appropriate patient status for this patient is OBSERVATION. Observation status is judged to be reasonable and necessary in order to provide the required intensity of service to ensure the patient's safety. The patient's presenting symptoms, physical exam findings, and initial radiographic and laboratory data in the context of their medical condition is felt to place them at decreased risk for further clinical deterioration. Furthermore, it is anticipated that the patient will be medically stable for discharge from the hospital within 2 midnights of admission.   Author: Danice Dural, MD 02/12/2024 7:47 AM  For on call review [email or web address].   This document was prepared using Dragon voice recognition software and may contain some unintended transcription errors.

## 2024-02-12 NOTE — Progress Notes (Signed)
 PHARMACY - ANTICOAGULATION CONSULT NOTE  Pharmacy Consult for Heparin  Indication: pulmonary embolus  Allergies  Allergen Reactions   Iodine Itching and Other (See Comments)    "lump in tongue when eats seafood"   Latex Itching    GLOVES   Shellfish Allergy Swelling    Patient Measurements: Height: 5\' 5"  (165.1 cm) Weight: 73.5 kg (162 lb) IBW/kg (Calculated) : 57 HEPARIN  DW (KG): 71.9  Vital Signs: Temp: 98.7 F (37.1 C) (05/01 2014) BP: 126/63 (05/01 2014) Pulse Rate: 59 (05/01 2014)  Labs: Recent Labs    02/11/24 1729 02/12/24 1529 02/12/24 1652 02/12/24 2134  HGB 14.5  --  13.4  --   HCT 43.2  --  41.9  --   PLT 439*  --  392  --   HEPARINUNFRC  --  0.36  --  0.55  CREATININE 0.55  --  1.01*  --     Estimated Creatinine Clearance: 47.6 mL/min (A) (by C-G formula based on SCr of 1.01 mg/dL (H)).   Medical History: Past Medical History:  Diagnosis Date   GERD (gastroesophageal reflux disease) 08/29/2021   Hyperlipidemia    Hypertension    Osteopenia    per DEXA on 07-19-09   Stroke (HCC) 09/14/2007   has mild left leg weakness, saw Dr. Albertina Hugger    Assessment: AC/Heme: New PE. Baseline CBC WNL - Hep level 0.55 in goal.  Goal of Therapy:  Heparin  level 0.3-0.7 units/ml Monitor platelets by anticoagulation protocol: Yes   Plan:  IV heparin  1150 units/hr Daily HL and CBC F/u dopplers  Beau Bound RPh 02/12/2024, 10:24 PM

## 2024-02-12 NOTE — Progress Notes (Signed)
  Carryover admission to the Day Admitter.  I discussed this case with the EDP, Dr. Donita Furrow.  Per these discussions:   This is a 78 year old female who is being admitted with acute pulmonary emboli after presenting with dizziness and vertigo symptoms.  For the latter she underwent CTA head and neck, which showed no evidence of large vessel occlusion, but did show evidence of multiple acute pulmonary emboli, including segmental right upper lobe pulmonary embolism as well as subsegmental left lower lobe pulmonary embolism.  No prior history of PE.  Not associate with any report of chest pain or shortness of breath.  Resume if stable.  Started on heparin  drip by EDP.  I have placed an order for observation to med/tele for further evaluation management of the above.  I have placed some additional preliminary admit orders via the adult multi-morbid admission order set. I have also ordered continuation of heparin  drip in addition to placing order for echocardiogram in the morning.    Camelia Cavalier, DO Hospitalist

## 2024-02-12 NOTE — Progress Notes (Signed)
 PHARMACY - ANTICOAGULATION CONSULT NOTE  Pharmacy Consult for IV heparin  Indication: pulmonary embolus  Allergies  Allergen Reactions   Iodine Itching and Other (See Comments)    "lump in tongue when eats seafood"   Latex Itching    GLOVES   Shellfish Allergy Swelling    Patient Measurements: Height: 5\' 5"  (165.1 cm) Weight: 73.5 kg (162 lb) IBW/kg (Calculated) : 57 HEPARIN  DW (KG): 71.9  Vital Signs: Temp: 98.3 F (36.8 C) (05/01 0425) Temp Source: Oral (04/30 2100) BP: 123/69 (05/01 0530) Pulse Rate: 60 (05/01 0530)  Labs: Recent Labs    02/11/24 1729  HGB 14.5  HCT 43.2  PLT 439*  CREATININE 0.55    Estimated Creatinine Clearance: 60.1 mL/min (by C-G formula based on SCr of 0.55 mg/dL).   Medical History: Past Medical History:  Diagnosis Date   Hyperlipidemia    Hypertension    Osteopenia    per DEXA on 07-19-09   Stroke (HCC) 09-2007   has mild left leg weakness, saw Dr. Albertina Hugger    Medications:  (Not in a hospital admission)   Assessment: Pharmacy is consulted to start heparin  drip on 78 yo female diagnosed with acute PE. CT PE study is positive for right upper lobe PE as well as nonocclusive thrombus on the left. Med rec not completed yet, but no indication that pt is on blood thinners or antiplatelet agents PTA .  Today, 02/12/24  Hgb 14.5, plt 439  Scr 0.55 mg/dl, CRCl 60 ml/min    Goal of Therapy:  Heparin  level 0.3-0.7 units/ml Monitor platelets by anticoagulation protocol: Yes   Plan:  Heparin  5000 unit bolus followed by heparin  1150 units/hr  Obtain HL 8 hours after start of infusion Daily CBC while on heparin  Monitor for signs and symptoms of bleeding    Van Gelinas, PharmD, BCPS 02/12/2024 6:51 AM

## 2024-02-12 NOTE — Progress Notes (Signed)
 PHARMACY - ANTICOAGULATION CONSULT NOTE  Pharmacy Consult for Heparin  Indication: pulmonary embolus  Allergies  Allergen Reactions   Iodine Itching and Other (See Comments)    "lump in tongue when eats seafood"   Latex Itching    GLOVES   Shellfish Allergy Swelling    Patient Measurements: Height: 5\' 5"  (165.1 cm) Weight: 73.5 kg (162 lb) IBW/kg (Calculated) : 57 HEPARIN  DW (KG): 71.9  Vital Signs: Temp: 98.1 F (36.7 C) (05/01 1145) BP: 102/67 (05/01 1445) Pulse Rate: 60 (05/01 1445)  Labs: Recent Labs    02/11/24 1729 02/12/24 1529  HGB 14.5  --   HCT 43.2  --   PLT 439*  --   HEPARINUNFRC  --  0.36  CREATININE 0.55  --     Estimated Creatinine Clearance: 60.1 mL/min (by C-G formula based on SCr of 0.55 mg/dL).   Medical History: Past Medical History:  Diagnosis Date   Hyperlipidemia    Hypertension    Osteopenia    per DEXA on 07-19-09   Stroke (HCC) 09-2007   has mild left leg weakness, saw Dr. Albertina Hugger    Assessment: AC/Heme: New PE. Baseline CBC WNL - Hep level 0.36 in goal.  Goal of Therapy:  Heparin  level 0.3-0.7 units/ml Monitor platelets by anticoagulation protocol: Yes   Plan:  IV heparin  1150 units/hr Confirm therapeutic hep level in 6 hrs. Daily HL and CBC F/u dopplers  Riyana Biel S. Zannie Hey, PharmD, BCPS Clinical Staff Pharmacist Enis Harsh Stillinger 02/12/2024,4:15 PM

## 2024-02-12 NOTE — Progress Notes (Signed)
  Echocardiogram 2D Echocardiogram has been performed.  Royden Corin 02/12/2024, 9:46 AM

## 2024-02-13 ENCOUNTER — Observation Stay (HOSPITAL_COMMUNITY)

## 2024-02-13 DIAGNOSIS — E876 Hypokalemia: Secondary | ICD-10-CM | POA: Diagnosis not present

## 2024-02-13 DIAGNOSIS — E7841 Elevated Lipoprotein(a): Secondary | ICD-10-CM | POA: Diagnosis not present

## 2024-02-13 DIAGNOSIS — R42 Dizziness and giddiness: Secondary | ICD-10-CM

## 2024-02-13 DIAGNOSIS — Z86711 Personal history of pulmonary embolism: Secondary | ICD-10-CM | POA: Diagnosis not present

## 2024-02-13 DIAGNOSIS — K219 Gastro-esophageal reflux disease without esophagitis: Secondary | ICD-10-CM

## 2024-02-13 DIAGNOSIS — I10 Essential (primary) hypertension: Secondary | ICD-10-CM | POA: Diagnosis not present

## 2024-02-13 DIAGNOSIS — I2699 Other pulmonary embolism without acute cor pulmonale: Secondary | ICD-10-CM | POA: Diagnosis not present

## 2024-02-13 DIAGNOSIS — I2690 Septic pulmonary embolism without acute cor pulmonale: Secondary | ICD-10-CM

## 2024-02-13 LAB — CBC
HCT: 37.2 % (ref 36.0–46.0)
HCT: 39 % (ref 36.0–46.0)
Hemoglobin: 12.4 g/dL (ref 12.0–15.0)
Hemoglobin: 12.9 g/dL (ref 12.0–15.0)
MCH: 30.7 pg (ref 26.0–34.0)
MCH: 30.1 pg (ref 26.0–34.0)
MCHC: 33.3 g/dL (ref 30.0–36.0)
MCHC: 33.1 g/dL (ref 30.0–36.0)
MCV: 92.1 fL (ref 80.0–100.0)
MCV: 90.9 fL (ref 80.0–100.0)
Platelets: 356 10*3/uL (ref 150–400)
Platelets: 381 10*3/uL (ref 150–400)
RBC: 4.04 MIL/uL (ref 3.87–5.11)
RBC: 4.29 MIL/uL (ref 3.87–5.11)
RDW: 13.1 % (ref 11.5–15.5)
RDW: 13 % (ref 11.5–15.5)
WBC: 9.7 10*3/uL (ref 4.0–10.5)
WBC: 9.3 10*3/uL (ref 4.0–10.5)
nRBC: 0 % (ref 0.0–0.2)
nRBC: 0 % (ref 0.0–0.2)

## 2024-02-13 LAB — RENAL FUNCTION PANEL
Albumin: 3.4 g/dL — ABNORMAL LOW (ref 3.5–5.0)
Anion gap: 8 (ref 5–15)
BUN: 16 mg/dL (ref 8–23)
CO2: 28 mmol/L (ref 22–32)
Calcium: 8.8 mg/dL — ABNORMAL LOW (ref 8.9–10.3)
Chloride: 98 mmol/L (ref 98–111)
Creatinine, Ser: 0.55 mg/dL (ref 0.44–1.00)
GFR, Estimated: >60 mL/min (ref >60)
Glucose, Bld: 144 mg/dL — ABNORMAL HIGH (ref 70–99)
Phosphorus: 3.1 mg/dL (ref 2.5–4.6)
Potassium: 3.4 mmol/L — ABNORMAL LOW (ref 3.5–5.1)
Sodium: 134 mmol/L — ABNORMAL LOW (ref 135–145)

## 2024-02-13 LAB — HEPARIN LEVEL (UNFRACTIONATED)
Heparin Unfractionated: 0.71 [IU]/mL — ABNORMAL HIGH (ref 0.30–0.70)
Heparin Unfractionated: 0.71 [IU]/mL — ABNORMAL HIGH (ref 0.30–0.70)

## 2024-02-13 NOTE — Progress Notes (Signed)
   02/13/24 1357  TOC Brief Assessment  Insurance and Status Reviewed  Patient has primary care physician Yes  Home environment has been reviewed Home with family  Prior level of function: Indepedent  Prior/Current Home Services No current home services  Readmission risk has been reviewed Yes  Transition of care needs transition of care needs identified, TOC will continue to follow   MOON given, pt has no new recommendations at this time. TOC will continue to follow.

## 2024-02-13 NOTE — Progress Notes (Signed)
   02/13/24 1540  Spiritual Encounters  Type of Visit Attempt (pt unavailable)   Per spiritual consult request by clinical team, I attempted to visit with Ms. Wiens. At time of visit she was sleeping soundly. Will attempt to visit later for support and to share HCPOA information.  Whitt Auletta L. Minetta Aly, M.Div [phone]

## 2024-02-13 NOTE — Care Management Obs Status (Signed)
 MEDICARE OBSERVATION STATUS NOTIFICATION   Patient Details  Name: Anne [name] MRN: [id number] Date of Birth: [birth year]   Medicare Observation Status Notification Given:  Yes    Tessie Fila, RN 02/13/2024, 1:15 PM

## 2024-02-13 NOTE — Plan of Care (Addendum)
 VSS. No c/o pain. LBM 4/30. Heparin  gtt infusing at 11.5ml/hr. No evidence of bleeding. No acute events overnight.  Problem: Education: Goal: Knowledge of General Education information will improve Description: Including pain rating scale, medication(s)/side effects and non-pharmacologic comfort measures Outcome: Progressing   Problem: Clinical Measurements: Goal: Ability to maintain clinical measurements within normal limits will improve Outcome: Progressing Goal: Will remain free from infection Outcome: Progressing Goal: Cardiovascular complication will be avoided Outcome: Progressing   Problem: Safety: Goal: Ability to remain free from injury will improve Outcome: Progressing

## 2024-02-13 NOTE — Progress Notes (Signed)
 Lower extremity venous duplex completed. Please see CV Procedures for preliminary results.  Estanislao Heimlich, RVT 02/13/24 2:07 PM

## 2024-02-13 NOTE — Progress Notes (Signed)
 PHARMACY - ANTICOAGULATION CONSULT NOTE  Pharmacy Consult for Heparin  Indication: pulmonary embolus  Allergies  Allergen Reactions   Iodine Itching and Other (See Comments)    "lump in tongue when eats seafood"   Latex Itching    GLOVES   Shellfish Allergy Swelling    Patient Measurements: Height: 5\' 5"  (165.1 cm) Weight: 73.5 kg (162 lb) IBW/kg (Calculated) : 57 HEPARIN  DW (KG): 71.9  Vital Signs: Temp: 97.6 F (36.4 C) (05/02 0448) BP: 145/76 (05/02 0448) Pulse Rate: 49 (05/02 0448)  Labs: Recent Labs    02/11/24 1729 02/12/24 1529 02/12/24 1652 02/12/24 2134 02/13/24 0521  HGB 14.5  --  13.4  --  12.4  HCT 43.2  --  41.9  --  37.2  PLT 439*  --  392  --  356  HEPARINUNFRC  --  0.36  --  0.55 0.71*  CREATININE 0.55  --  1.01*  --   --     Estimated Creatinine Clearance: 47.6 mL/min (A) (by C-G formula based on SCr of 1.01 mg/dL (H)).   Medical History: Past Medical History:  Diagnosis Date   GERD (gastroesophageal reflux disease) 08/29/2021   Hyperlipidemia    Hypertension    Osteopenia    per DEXA on 07-19-09   Stroke (HCC) 09/14/2007   has mild left leg weakness, saw Dr. Albertina Hugger    Assessment: Pharmacy is consulted to start heparin  drip on 78 yo female diagnosed with acute PE. CT PE study is positive for right upper lobe PE as well as nonocclusive thrombus on the left. Med rec not completed yet, but no indication that pt is on blood thinners or antiplatelet agents PTA     AC/Heme:  HL 0.71, slightly supratherapeutic  Hgb 12.4, plt 356   Goal of Therapy:  Heparin  level 0.3-0.7 units/ml Monitor platelets by anticoagulation protocol: Yes   Plan:  Decrease IV heparin  to 1100 units/hr as last few levels have been trending up  Obtain HL 8 hours after rate change  Daily HL and CBC F/u dopplers  Van Gelinas, PharmD, BCPS 02/13/2024 9:57 AM

## 2024-02-13 NOTE — Progress Notes (Signed)
 PHARMACY - ANTICOAGULATION CONSULT NOTE  Pharmacy Consult for Heparin  Indication: pulmonary embolus  Allergies  Allergen Reactions   Iodine Itching and Other (See Comments)    "lump in tongue when eats seafood"   Latex Itching    GLOVES   Shellfish Allergy Swelling    Patient Measurements: Height: 5\' 5"  (165.1 cm) Weight: 73.5 kg (162 lb) IBW/kg (Calculated) : 57 HEPARIN  DW (KG): 71.9  Vital Signs: Temp: 97.3 F (36.3 C) (05/02 1212) Temp Source: Oral (05/02 1212) BP: 157/80 (05/02 1212) Pulse Rate: 57 (05/02 1212)  Labs: Recent Labs    02/11/24 1729 02/12/24 1529 02/12/24 1652 02/12/24 2134 02/13/24 0521 02/13/24 1108 02/13/24 1843  HGB 14.5  --  13.4  --  12.4 12.9  --   HCT 43.2  --  41.9  --  37.2 39.0  --   PLT 439*  --  392  --  356 381  --   HEPARINUNFRC  --    < >  --  0.55 0.71*  --  0.71*  CREATININE 0.55  --  1.01*  --   --  0.55  --    < > = values in this interval not displayed.    Estimated Creatinine Clearance: 60.1 mL/min (by C-G formula based on SCr of 0.55 mg/dL).   Medical History: Past Medical History:  Diagnosis Date   GERD (gastroesophageal reflux disease) 08/29/2021   Hyperlipidemia    Hypertension    Osteopenia    per DEXA on 07-19-09   Stroke (HCC) 09/14/2007   has mild left leg weakness, saw Dr. Albertina Hugger    Assessment: Pharmacy is consulted to start heparin  drip on 78 yo female diagnosed with acute PE. CT PE study is positive for right upper lobe PE as well as nonocclusive thrombus on the left. Med rec not completed yet, but no indication that pt is on blood thinners or antiplatelet agents PTA     AC/Heme:  18:43 Heparin  level (HL) 0.71, remains slightly supratherapeutic with IV heparin  infusing at decreased rate of 1100 units/hr Hgb 12.9, plt 381 Lower Venous DVT Study: No evidence of DVT No bleeding or complications of therapy per nurse  Goal of Therapy:  Heparin  level 0.3-0.7 units/ml Monitor platelets by  anticoagulation protocol: Yes   Plan:  Decrease IV heparin  to 1050 units/hr  Obtain HL 8 hours after rate change  Monitor daily heparin  level, CBC, signs/symptoms of bleeding   Thank you for allowing pharmacy to be a part of this patient's care.  Alfredo Inch, PharmD, BCPS Clinical Pharmacist [facility] 02/13/2024 7:39 PM

## 2024-02-13 NOTE — Progress Notes (Signed)
 PROGRESS NOTE    Anne [name]  NFA:[id number] DOB: 14-Mar-1946 DOA: 02/11/2024 PCP: Anne Furth, MD  Outpatient Specialists:     Brief Narrative:  Patient is a 78 year old female with past medical history significant for hyperlipidemia, hypertension, osteopenia, history of CVA with mild residual left lower extremity weakness, GERD, history of myositis, vertigo, supraventricular premature beats, history of bradycardia and allergic rhinitis.  Reports having pain involving the lower extremity prior to presentation.  Patient also reported vertigo.  Dental, patient was found to have pulmonary embolism during workup.  Other findings include potassium of 3.3 and serum creatinine of 1.01 (up from 0.55).  Patient is currently on heparin .  Vertigo has resolved.  Leg pain has resolved.   Assessment & Plan:   Principal Problem:   Acute pulmonary embolism (HCC) Active Problems:   Hyperlipidemia   Essential hypertension   GERD (gastroesophageal reflux disease)   Vertigo   Hypokalemia   Acute pulmonary embolism (HCC) -Continue heparin  drip. - Echo result is noted. - Likely switch to Eliquis the next 1 to 2 days.    Hyperlipidemia Continue ezetimibe  10 mg p.o. daily. Continue rosuvastatin  20 mg p.o. daily.   Essential hypertension Continue amlodipine  10 mg p.o. daily. Continue hydrochlorothiazide  25 mg p.o. daily. Continue losartan  50 mg p.o. daily.   GERD (gastroesophageal reflux disease) Antiacid, H2 blocker or PPI as needed.   Vertigo -Likely benign. - Resolved.   Continue meclizine  25 mg TID as needed.   Hypokalemia -Likely secondary to nausea and vomiting - Continue to monitor and replete. - Also monitor magnesium level.    DVT prophylaxis: Heparin  drip Code Status: Full code Family Communication:  Disposition Plan: Observation.   Consultants:  None.  Procedures:  None.  Antimicrobials:  None.   Subjective: No chest pain. No shortness of breath. No  leg pain. No particular  Objective: Vitals:   02/12/24 2014 02/12/24 2359 02/13/24 0448 02/13/24 1212  BP: 126/63 122/63 (!) 145/76 (!) 157/80  Pulse: (!) 59 (!) 55 (!) 49 (!) 57  Resp: 18 18 18 16   Temp: 98.7 F (37.1 C) 98.3 F (36.8 C) 97.6 F (36.4 C) (!) 97.3 F (36.3 C)  TempSrc:    Oral  SpO2: 98% 95% 95% 98%  Weight:      Height:        Intake/Output Summary (Last 24 hours) at 02/13/2024 1608 Last data filed at 02/13/2024 4696 Gross per 24 hour  Intake 560 ml  Output 250 ml  Net 310 ml   Filed Weights   02/12/24 0335  Weight: 73.5 kg    Examination:  General exam: Appears calm and comfortable  Respiratory system: Clear to auscultation.  Cardiovascular system: S1 & S2, systolic murmur.   Gastrointestinal system: Abdomen is left and nontender. Central nervous system: Awake and alert.  Extremities: No leg edema.  Data Reviewed: I have personally reviewed following labs and imaging studies  CBC: Recent Labs  Lab 02/11/24 1729 02/12/24 1652 02/13/24 0521 02/13/24 1108  WBC 14.1* 12.3* 9.7 9.3  HGB 14.5 13.4 12.4 12.9  HCT 43.2 41.9 37.2 39.0  MCV 90.0 94.8 92.1 90.9  PLT 439* 392 356 381   Basic Metabolic Panel: Recent Labs  Lab 02/11/24 1729 02/12/24 1652 02/13/24 1108  NA 136 134* 134*  K 3.1* 3.3* 3.4*  CL 98 99 98  CO2 26 24 28   GLUCOSE 144* 96 144*  BUN 13 15 16   CREATININE 0.55 1.01* 0.55  CALCIUM  9.1 8.7* 8.8*  MG  --  2.2  --   PHOS  --  3.0 3.1   GFR: Estimated Creatinine Clearance: 60.1 mL/min (by C-G formula based on SCr of 0.55 mg/dL). Liver Function Tests: Recent Labs  Lab 02/11/24 1729 02/13/24 1108  AST 31  --   ALT 25  --   ALKPHOS 80  --   BILITOT 0.7  --   PROT 8.1  --   ALBUMIN 4.1 3.4*   Recent Labs  Lab 02/11/24 1729  LIPASE 26   No results for input(s): "AMMONIA" in the last 168 hours. Coagulation Profile: No results for input(s): "INR", "PROTIME" in the last 168 hours. Cardiac Enzymes: No results for  input(s): "CKTOTAL", "CKMB", "CKMBINDEX", "TROPONINI" in the last 168 hours. BNP (last 3 results) No results for input(s): "PROBNP" in the last 8760 hours. HbA1C: No results for input(s): "HGBA1C" in the last 72 hours. CBG: No results for input(s): "GLUCAP" in the last 168 hours. Lipid Profile: No results for input(s): "CHOL", "HDL", "LDLCALC", "TRIG", "CHOLHDL", "LDLDIRECT" in the last 72 hours. Thyroid  Function Tests: No results for input(s): "TSH", "T4TOTAL", "FREET4", "T3FREE", "THYROIDAB" in the last 72 hours. Anemia Panel: No results for input(s): "VITAMINB12", "FOLATE", "FERRITIN", "TIBC", "IRON", "RETICCTPCT" in the last 72 hours. Urine analysis:    Component Value Date/Time   COLORURINE YELLOW 02/11/2024 1901   APPEARANCEUR HAZY (A) 02/11/2024 1901   LABSPEC 1.014 02/11/2024 1901   PHURINE 7.0 02/11/2024 1901   GLUCOSEU NEGATIVE 02/11/2024 1901   HGBUR NEGATIVE 02/11/2024 1901   BILIRUBINUR NEGATIVE 02/11/2024 1901   BILIRUBINUR neg 08/01/2023 0927   KETONESUR 20 (A) 02/11/2024 1901   PROTEINUR 100 (A) 02/11/2024 1901   UROBILINOGEN 0.2 08/01/2023 0927   UROBILINOGEN 0.2 06/24/2022 1122   NITRITE NEGATIVE 02/11/2024 1901   LEUKOCYTESUR NEGATIVE 02/11/2024 1901   Sepsis Labs: @LABRCNTIP (procalcitonin:4,lacticidven:4)  )No results found for this or any previous visit (from the past 240 hours).       Radiology Studies: VAS US  LOWER EXTREMITY VENOUS (DVT) Result Date: 02/13/2024  Lower Venous DVT Study Patient Name:  Anne [name]  Date of Exam:   02/13/2024 Medical Rec #: [id number]         Accession #:    [id number] Date of Birth: 11-20-1945         Patient Gender: F Patient Age:   66 years Exam Location:  [facility] Procedure:      VAS US  LOWER EXTREMITY VENOUS (DVT) Referring Phys: Anne [name] --------------------------------------------------------------------------------  Indications: Pulmonary embolism.  Risk Factors: Confirmed PE past pregnancy.  Comparison Study: None. Performing Technologist: Anne [name]  Examination Guidelines: A complete evaluation includes B-mode imaging, spectral Doppler, color Doppler, and power Doppler as needed of all accessible portions of each vessel. Bilateral testing is considered an integral part of a complete examination. Limited examinations for reoccurring indications may be performed as noted. The reflux portion of the exam is performed with the patient in reverse Trendelenburg.  +---------+---------------+---------+-----------+----------+-------------------+ RIGHT    CompressibilityPhasicitySpontaneityPropertiesThrombus Aging      +---------+---------------+---------+-----------+----------+-------------------+ CFV      Full           Yes      Yes                                      +---------+---------------+---------+-----------+----------+-------------------+ SFJ      Full                                                             +---------+---------------+---------+-----------+----------+-------------------+  FV Prox  Full                                                             +---------+---------------+---------+-----------+----------+-------------------+ FV Mid   Full                                                             +---------+---------------+---------+-----------+----------+-------------------+ FV DistalFull                                                             +---------+---------------+---------+-----------+----------+-------------------+ PFV      Full                                                             +---------+---------------+---------+-----------+----------+-------------------+ POP      Full           Yes      Yes                                      +---------+---------------+---------+-----------+----------+-------------------+ PTV      Full                    Yes                                       +---------+---------------+---------+-----------+----------+-------------------+ PERO                             Yes                  Not well visualized +---------+---------------+---------+-----------+----------+-------------------+   +---------+---------------+---------+-----------+----------+-------------------+ LEFT     CompressibilityPhasicitySpontaneityPropertiesThrombus Aging      +---------+---------------+---------+-----------+----------+-------------------+ CFV      Full           Yes      Yes                                      +---------+---------------+---------+-----------+----------+-------------------+ SFJ      Full                                                             +---------+---------------+---------+-----------+----------+-------------------+ FV Prox  Full                                                             +---------+---------------+---------+-----------+----------+-------------------+  FV Mid   Full                                                             +---------+---------------+---------+-----------+----------+-------------------+ FV DistalFull                                                             +---------+---------------+---------+-----------+----------+-------------------+ PFV      Full                                                             +---------+---------------+---------+-----------+----------+-------------------+ POP      Full           Yes      Yes                                      +---------+---------------+---------+-----------+----------+-------------------+ PTV      Full                    Yes                                      +---------+---------------+---------+-----------+----------+-------------------+ PERO     Full                    Yes                  Not well visualized +---------+---------------+---------+-----------+----------+-------------------+      Summary: BILATERAL: - No evidence of deep vein thrombosis seen in the lower extremities, bilaterally. -No evidence of popliteal cyst, bilaterally.   *See table(s) above for measurements and observations.    Preliminary    ECHOCARDIOGRAM COMPLETE Result Date: 02/12/2024    ECHOCARDIOGRAM REPORT   Patient Name:   EMMELINE SHINE Date of Exam: 02/12/2024 Medical Rec #:  [id number]        Height:       65.0 in Accession #:    [id number]       Weight:       162.0 lb Date of Birth:  01/31/1946        BSA:          1.809 m Patient Age:    77 years         BP:           123/69 mmHg Patient Gender: F                HR:           63 bpm. Exam Location:  Inpatient Procedure: 2D Echo, Cardiac Doppler and Color Doppler (Both Spectral and Color            Flow Doppler were utilized during procedure). Indications:    I26.02 Pulmonary  embolus  History:        Patient has prior history of Echocardiogram examinations, most                 recent 08/07/2022. Abnormal ECG,                 Signs/Symptoms:Dizziness/Lightheadedness; Risk                 Factors:Hypertension and Dyslipidemia.  Sonographer:    Raynelle Callow RDCS Referring Phys: [id number] Roxana Copier IMPRESSIONS  1. Left ventricular ejection fraction, by estimation, is 60 to 65%. Left ventricular ejection fraction by 2D MOD biplane is 65.6 %. The left ventricle has normal function. The left ventricle has no regional wall motion abnormalities. Left ventricular diastolic parameters are consistent with Grade I diastolic dysfunction (impaired relaxation).  2. Right ventricular systolic function is normal. The right ventricular size is mildly enlarged. There is mildly elevated pulmonary artery systolic pressure. The estimated right ventricular systolic pressure is 45.0 mmHg.  3. Right atrial size was mildly dilated.  4. The mitral valve is normal in structure. Trivial mitral valve regurgitation. No evidence of mitral stenosis. Moderate mitral annular calcification.  5. Tricuspid  valve regurgitation is mild to moderate.  6. The aortic valve is tricuspid. There is moderate calcification of the aortic valve. Aortic valve regurgitation is mild. Mild to moderate aortic valve stenosis. Aortic valve area, by VTI measures 1.35 cm. Aortic valve mean gradient measures 17.0 mmHg.  7. The inferior vena cava is dilated in size with <50% respiratory variability, suggesting right atrial pressure of 15 mmHg. FINDINGS  Left Ventricle: Left ventricular ejection fraction, by estimation, is 60 to 65%. Left ventricular ejection fraction by 2D MOD biplane is 65.6 %. The left ventricle has normal function. The left ventricle has no regional wall motion abnormalities. The left ventricular internal cavity size was normal in size. There is no left ventricular hypertrophy. Left ventricular diastolic parameters are consistent with Grade I diastolic dysfunction (impaired relaxation). Right Ventricle: The right ventricular size is mildly enlarged. No increase in right ventricular wall thickness. Right ventricular systolic function is normal. There is mildly elevated pulmonary artery systolic pressure. The tricuspid regurgitant velocity is 2.74 m/s, and with an assumed right atrial pressure of 15 mmHg, the estimated right ventricular systolic pressure is 45.0 mmHg. Left Atrium: Left atrial size was normal in size. Right Atrium: Right atrial size was mildly dilated. Pericardium: There is no evidence of pericardial effusion. Mitral Valve: The mitral valve is normal in structure. Moderate mitral annular calcification. Trivial mitral valve regurgitation. No evidence of mitral valve stenosis. MV peak gradient, 7.1 mmHg. The mean mitral valve gradient is 2.0 mmHg. Tricuspid Valve: The tricuspid valve is normal in structure. Tricuspid valve regurgitation is mild to moderate. Aortic Valve: The aortic valve is tricuspid. There is moderate calcification of the aortic valve. Aortic valve regurgitation is mild. Aortic regurgitation  PHT measures 1100 msec. Mild to moderate aortic stenosis is present. Aortic valve mean gradient measures 17.0 mmHg. Aortic valve peak gradient measures 29.7 mmHg. Aortic valve area, by VTI measures 1.35 cm. Pulmonic Valve: The pulmonic valve was normal in structure. Pulmonic valve regurgitation is trivial. Aorta: The aortic root is normal in size and structure. Venous: The inferior vena cava is dilated in size with less than 50% respiratory variability, suggesting right atrial pressure of 15 mmHg. IAS/Shunts: No atrial level shunt detected by color flow Doppler.  LEFT VENTRICLE PLAX 2D  Biplane EF (MOD) LVIDd:         4.80 cm         LV Biplane EF:   Left LVIDs:         3.10 cm                          ventricular LV PW:         1.20 cm                          ejection LV IVS:        1.10 cm                          fraction by LVOT diam:     2.10 cm                          2D MOD LV SV:         83                               biplane is LV SV Index:   46                               65.6 %. LVOT Area:     3.46 cm                                Diastology                                LV e' medial:    5.22 cm/s LV Volumes (MOD)               LV E/e' medial:  14.5 LV vol d, MOD    77.8 ml       LV e' lateral:   4.35 cm/s A2C:                           LV E/e' lateral: 17.4 LV vol d, MOD    48.1 ml A4C: LV vol s, MOD    23.3 ml A2C: LV vol s, MOD    19.0 ml A4C: LV SV MOD A2C:   54.5 ml LV SV MOD A4C:   48.1 ml LV SV MOD BP:    40.6 ml RIGHT VENTRICLE             IVC RV S prime:     15.10 cm/s  IVC diam: 2.40 cm TAPSE (M-mode): 2.8 cm LEFT ATRIUM             Index        RIGHT ATRIUM           Index LA diam:        2.20 cm 1.22 cm/m   RA Area:     19.10 cm LA Vol (A2C):   21.8 ml 12.05 ml/m  RA Volume:   59.20 ml  32.73 ml/m LA Vol (A4C):   33.7 ml 18.63 ml/m LA Biplane Vol: 28.0 ml 15.48 ml/m  AORTIC VALVE  PULMONIC VALVE AV Area (Vmax):    1.46 cm      PR End  Diast Vel: 1.62 msec AV Area (Vmean):   1.40 cm AV Area (VTI):     1.35 cm AV Vmax:           272.33 cm/s AV Vmean:          180.000 cm/s AV VTI:            0.618 m AV Peak Grad:      29.7 mmHg AV Mean Grad:      17.0 mmHg LVOT Vmax:         114.50 cm/s LVOT Vmean:        72.750 cm/s LVOT VTI:          0.240 m LVOT/AV VTI ratio: 0.39 AI PHT:            1100 msec  AORTA Ao Root diam: 2.90 cm Ao Asc diam:  3.40 cm MITRAL VALVE                TRICUSPID VALVE MV Area (PHT): 3.17 cm     TR Peak grad:   30.0 mmHg MV Area VTI:   2.13 cm     TR Vmax:        274.00 cm/s MV Peak grad:  7.1 mmHg MV Mean grad:  2.0 mmHg     SHUNTS MV Vmax:       1.33 m/s     Systemic VTI:  0.24 m MV Vmean:      58.1 cm/s    Systemic Diam: 2.10 cm MV Decel Time: 239 msec MV E velocity: 75.50 cm/s MV A velocity: 112.00 cm/s MV E/A ratio:  0.67 Dalton McleanMD Electronically signed by Archer Bear Signature Date/Time: 02/12/2024/10:21:07 AM    Final    CT Angio Chest PE W and/or Wo Contrast Result Date: 02/12/2024 CLINICAL DATA:  78 year old female with upper lobe pulmonary emboli on CTA head and neck this morning. EXAM: CT ANGIOGRAPHY CHEST WITH CONTRAST TECHNIQUE: Multidetector CT imaging of the chest was performed using the standard protocol during bolus administration of intravenous contrast. Multiplanar CT image reconstructions and MIPs were obtained to evaluate the vascular anatomy. RADIATION DOSE REDUCTION: This exam was performed according to the departmental dose-optimization program which includes automated exposure control, adjustment of the mA and/or kV according to patient size and/or use of iterative reconstruction technique. CONTRAST:  75mL OMNIPAQUE  IOHEXOL  350 MG/ML SOLN COMPARISON:  CTA head and neck today 0039 hours. No previous chest CTA. FINDINGS: Cardiovascular: Excellent contrast bolus timing in the pulmonary arterial tree. Interestingly, the left upper lobe pulmonary artery filling defects which were most conspicuous  at 0039 hours today do not persist. And there is no central or saddle embolus. However, positive for contralateral right upper lobe segmental pulmonary artery thrombus (series 10, image 129), more nonocclusive clot in the left lower lobe superior segment (series 10, image 147). Heart size at the upper limits of normal. No pericardial effusion. Calcified aortic atherosclerosis. No contrast in the aorta on this exam. Calcified coronary artery atherosclerosis on series 10, image 175. Mediastinum/Nodes: Negative for mediastinal mass or lymphadenopathy. Lungs/Pleura: Major airways are patent. Lung volumes somewhat lower than that on the earlier neck CTA. Patchy dependent and bilateral lung base opacity most resembling atelectasis. No pleural effusion. No convincing pulmonary infarct. No consolidation. Upper Abdomen: Contrast being excreted from both kidneys. Small but circumscribed low-density area in the lateral right hepatic lobe series 4 image  115, compatible with benign etiology such as cyst or hemangioma (no follow-up imaging recommended). Negative visible spleen, pancreas, adrenal glands and bowel in the upper abdomen. Musculoskeletal: No acute or suspicious osseous lesion identified. Mild for age thoracic spine degeneration. Review of the MIP images confirms the above findings. IMPRESSION: 1. The study is Positive for Right Upper Lobe Segmental Pulmonary Embolus, and more non-occlusive appearing thrombus in the left lower lobe superior segment. However, the left upper lobe PE seen at 0039 hours today has resolved. No central or saddle embolus. 2. No pulmonary infarct or pleural effusion. Lower lung volumes with patchy atelectasis. 3. Calcified coronary artery, Aortic Atherosclerosis (ICD10-I70.0). Electronically Signed   By: Marlise Simpers M.D.   On: 02/12/2024 06:07   CT Angio Head Neck W WO CM Result Date: 02/12/2024 CLINICAL DATA:  Initial evaluation for acute vertigo. MRI from 11/22/2023 EXAM: CT ANGIOGRAPHY HEAD  AND NECK WITH AND WITHOUT CONTRAST TECHNIQUE: Multidetector CT imaging of the head and neck was performed using the standard protocol during bolus administration of intravenous contrast. Multiplanar CT image reconstructions and MIPs were obtained to evaluate the vascular anatomy. Carotid stenosis measurements (when applicable) are obtained utilizing NASCET criteria, using the distal internal carotid diameter as the denominator. RADIATION DOSE REDUCTION: This exam was performed according to the departmental dose-optimization program which includes automated exposure control, adjustment of the mA and/or kV according to patient size and/or use of iterative reconstruction technique. CONTRAST:  75mL OMNIPAQUE  IOHEXOL  350 MG/ML SOLN COMPARISON:  None Available. FINDINGS: CT HEAD FINDINGS Brain: Mild cerebral atrophy with chronic microvascular ischemic disease. No acute intracranial hemorrhage. No acute large vessel territory infarct. No mass lesion or midline shift. Mild ventricular prominence related global parenchymal volume loss without hydrocephalus. No extra-axial fluid collection. Vascular: No abnormal hyperdense vessel. Calcified atherosclerosis present at the skull base. Skull: Scalp soft tissues demonstrate no acute finding. Calvarium intact. Sinuses/Orbits: Right gaze noted. Paranasal sinuses and mastoid air cells are clear. Other: None. Review of the MIP images confirms the above findings CTA NECK FINDINGS Aortic arch: Visualized arch within normal limits for caliber standard branch pattern. Mild aortic atherosclerosis. No stenosis about the origin the great vessels. Right carotid system: Right common and internal carotid arteries are tortuous but patent without dissection. Mild atheromatous change about the right carotid bulb without stenosis. Left carotid system: Left common and internal carotid arteries are tortuous but patent without dissection. No hemodynamically significant stenosis about the left carotid  artery system. Vertebral arteries: Left vertebral artery arises directly from the aortic arch. Vertebral arteries are tortuous but patent without stenosis or dissection. Skeleton: Bones are diffusely osteopenic. There is question of superimposed scattered lucent lesions within the visualized osseous structures. While this may be related to osteopenia, possible myeloma could also have this appearance. Mild-to-moderate spondylosis at C5-6 and C6-7. Patient is edentulous. Other neck: No other acute finding. Few small thyroid  nodules measuring up to 1 cm, of doubtful significance given size and patient age, no follow-up imaging recommended (ref: J Am Coll Radiol. 2015 Feb;12(2): 143-50). Upper chest: Suspected subocclusive filling defect within a left upper lobe segmental pulmonary artery (series 12, image 4), suspicious for pulmonary embolism. No other visible acute findings. Review of the MIP images confirms the above findings CTA HEAD FINDINGS Anterior circulation: Atheromatous change about the carotid siphons without hemodynamically significant stenosis. A1 segments patent without stenosis. Right A1 dominant. Normal anterior communicating artery complex. Atheromatous change about the ACAs bilaterally with focal severe right A2/A3 stenosis (series 14, image  195). Atheromatous irregularity about the M1 segments bilaterally. Moderate stenosis at the distal left M1 segment (series 17, image 40). Left MCA bifurcation is ectatic without discrete aneurysm. MCA branches are patent and perfused bilaterally. Diffuse small vessel atheromatous irregularity. Posterior circulation: Dominant left V4 segment patent without significant stenosis. Atheromatous change about the right V4 segment with severe stenosis just prior to the vertebrobasilar junction (series 14, image 176). Neither PICA origin well visualized. Atheromatous irregularity about the basilar with moderate to severe stenoses about the mid-distal basilar artery (series  18, image 22). Superior cerebral arteries patent bilaterally. Basilar tip ectatic without discrete aneurysm. Both PCAs primarily supplied via the basilar. Atheromatous irregularity about the PCAs without proximal high-grade stenosis. Venous sinuses: Grossly patent allowing for timing the contrast bolus. Anatomic variants: As above.  No aneurysm. Review of the MIP images confirms the above findings IMPRESSION: CT HEAD: 1. No acute intracranial abnormality. 2. Mild cerebral atrophy with chronic microvascular ischemic disease. CTA HEAD AND NECK: 1. Negative CTA for large vessel occlusion or other emergent finding. 2. Extensive intracranial atherosclerotic disease with resultant severe right A2/A3 stenosis, moderate left M1 stenosis, severe right V4 stenosis just prior to the vertebrobasilar junction, with moderate to severe stenoses about the mid-distal basilar artery. 3. Diffuse tortuosity of the major arterial vasculature of the head and neck, suggesting chronic underlying hypertension. 4. Suspected subocclusive filling defect within a left upper lobe segmental pulmonary artery, suspicious for pulmonary embolism. Further evaluation with dedicated chest CT recommended. 5. Diffuse osteopenia with question of superimposed scattered lucent lesions within the visualized osseous structures. While this may be related to osteopenia, possible myeloma could also have this appearance. Correlation with laboratory values recommended. 6.  Aortic Atherosclerosis (ICD10-I70.0). Critical Value/emergent results were called by telephone at the time of interpretation on 02/12/2024 at 1:36 am to provider Dr Carylon Claude, who verbally acknowledged these results. Electronically Signed   By: Virgia Griffins M.D.   On: 02/12/2024 01:39        Scheduled Meds:  amLODipine   10 mg Oral Daily   ezetimibe   10 mg Oral Daily   hydrochlorothiazide   25 mg Oral Daily   losartan   50 mg Oral Daily   pneumococcal 20-valent conjugate vaccine  0.5  mL Intramuscular Tomorrow-1000   potassium chloride  SA  20 mEq Oral BID   rosuvastatin   20 mg Oral Daily   Continuous Infusions:  heparin  1,100 Units/hr (02/13/24 1043)     LOS: 0 days    Time spent: 35 minutes.    Fonnie Iba, MD  [facility] Pager #: [phone] 7PM-7AM contact night coverage as above

## 2024-02-14 ENCOUNTER — Other Ambulatory Visit (HOSPITAL_COMMUNITY): Payer: Self-pay

## 2024-02-14 DIAGNOSIS — E876 Hypokalemia: Secondary | ICD-10-CM | POA: Diagnosis not present

## 2024-02-14 DIAGNOSIS — I2699 Other pulmonary embolism without acute cor pulmonale: Secondary | ICD-10-CM | POA: Diagnosis not present

## 2024-02-14 LAB — CBC
HCT: 39.9 % (ref 36.0–46.0)
Hemoglobin: 13.1 g/dL (ref 12.0–15.0)
MCH: 30.2 pg (ref 26.0–34.0)
MCHC: 32.8 g/dL (ref 30.0–36.0)
MCV: 91.9 fL (ref 80.0–100.0)
Platelets: 374 10*3/uL (ref 150–400)
RBC: 4.34 MIL/uL (ref 3.87–5.11)
RDW: 12.8 % (ref 11.5–15.5)
WBC: 10.3 10*3/uL (ref 4.0–10.5)
nRBC: 0 % (ref 0.0–0.2)

## 2024-02-14 LAB — HEPARIN LEVEL (UNFRACTIONATED): Heparin Unfractionated: 0.51 [IU]/mL (ref 0.30–0.70)

## 2024-02-14 MED ORDER — APIXABAN (ELIQUIS) VTE STARTER PACK (10MG AND 5MG)
ORAL_TABLET | ORAL | 0 refills | Status: DC
  Filled 2024-02-14: qty 74, 30d supply, fill #0

## 2024-02-14 MED ORDER — APIXABAN 5 MG PO TABS
10.0000 mg | ORAL_TABLET | Freq: Two times a day (BID) | ORAL | Status: DC
  Administered 2024-02-14: 10 mg via ORAL
  Filled 2024-02-14: qty 2

## 2024-02-14 MED ORDER — APIXABAN 5 MG PO TABS: 5.0000 mg | ORAL_TABLET | Freq: Two times a day (BID) | ORAL | Status: DC

## 2024-02-14 MED ORDER — POTASSIUM CHLORIDE CRYS ER 20 MEQ PO TBCR
40.0000 meq | EXTENDED_RELEASE_TABLET | Freq: Once | ORAL | Status: AC
  Administered 2024-02-14: 40 meq via ORAL
  Filled 2024-02-14: qty 2

## 2024-02-14 NOTE — Discharge Instructions (Signed)
 Information on my medicine - ELIQUIS (apixaban)  Why was Eliquis prescribed for you? Eliquis was prescribed to treat blood clots that may have been found in the veins of your legs (deep vein thrombosis) or in your lungs (pulmonary embolism) and to reduce the risk of them occurring again.  What do You need to know about Eliquis ? The starting dose is 10 mg (two 5 mg tablets) taken TWICE daily for the FIRST SEVEN (7) DAYS, then on (enter date)  02/21/24 the dose is reduced to ONE 5 mg tablet taken TWICE daily.  Eliquis may be taken with or without food.   Try to take the dose about the same time in the morning and in the evening. If you have difficulty swallowing the tablet whole please discuss with your pharmacist how to take the medication safely.  Take Eliquis exactly as prescribed and DO NOT stop taking Eliquis without talking to the doctor who prescribed the medication.  Stopping may increase your risk of developing a new blood clot.  Refill your prescription before you run out.  After discharge, you should have regular check-up appointments with your healthcare provider that is prescribing your Eliquis.    What do you do if you miss a dose? If a dose of ELIQUIS is not taken at the scheduled time, take it as soon as possible on the same day and twice-daily administration should be resumed. The dose should not be doubled to make up for a missed dose.  Important Safety Information A possible side effect of Eliquis is bleeding. You should call your healthcare provider right away if you experience any of the following: Bleeding from an injury or your nose that does not stop. Unusual colored urine (red or dark brown) or unusual colored stools (red or black). Unusual bruising for unknown reasons. A serious fall or if you hit your head (even if there is no bleeding).  Some medicines may interact with Eliquis and might increase your risk of bleeding or clotting while on Eliquis. To help  avoid this, consult your healthcare provider or pharmacist prior to using any new prescription or non-prescription medications, including herbals, vitamins, non-steroidal anti-inflammatory drugs (NSAIDs) and supplements.  This website has more information on Eliquis (apixaban): http://www.eliquis.com/eliquis/home

## 2024-02-14 NOTE — Progress Notes (Signed)
 PHARMACY - ANTICOAGULATION CONSULT NOTE  Pharmacy Consult for Eliquis Indication: pulmonary embolus  Allergies  Allergen Reactions   Iodine Itching and Other (See Comments)    "lump in tongue when eats seafood"   Latex Itching    GLOVES   Shellfish Allergy Swelling    Patient Measurements: Height: 5\' 5"  (165.1 cm) Weight: 73.5 kg (162 lb) IBW/kg (Calculated) : 57 HEPARIN  DW (KG): 71.9  Vital Signs: Temp: 97.7 F (36.5 C) (05/03 0440) Temp Source: Oral (05/03 0440) BP: 135/74 (05/03 0440) Pulse Rate: 54 (05/03 0440)  Labs: Recent Labs    02/11/24 1729 02/12/24 1529 02/12/24 1652 02/12/24 2134 02/13/24 0521 02/13/24 1108 02/13/24 1843 02/14/24 0534  HGB 14.5  --  13.4  --  12.4 12.9  --  13.1  HCT 43.2  --  41.9  --  37.2 39.0  --  39.9  PLT 439*  --  392  --  356 381  --  374  HEPARINUNFRC  --    < >  --    < > 0.71*  --  0.71* 0.51  CREATININE 0.55  --  1.01*  --   --  0.55  --   --    < > = values in this interval not displayed.    Estimated Creatinine Clearance: 60.1 mL/min (by C-G formula based on SCr of 0.55 mg/dL).   Medical History: Past Medical History:  Diagnosis Date   GERD (gastroesophageal reflux disease) 08/29/2021   Hyperlipidemia    Hypertension    Osteopenia    per DEXA on 07-19-09   Stroke (HCC) 09/14/2007   has mild left leg weakness, saw Dr. Albertina Hugger    Assessment:  AC/Heme: New PE. Baseline CBC WNL. dopplers = no evidence of DVT - 5/3: Start Eliquis . CBC remains WNL  Goal of Therapy:  Therapeutic oral anticoagulation  Plan:  D/c IV heparin  Eliquis 10mg  BID x 7d then 5mg  BID   Shaan Rhoads S. Zannie Hey, PharmD, BCPS Clinical Staff Pharmacist Enis Harsh Stillinger 02/14/2024,8:56 AM

## 2024-02-14 NOTE — Plan of Care (Signed)

## 2024-02-14 NOTE — Progress Notes (Signed)
 Mobility Specialist - Progress Note   02/14/24 1203  Mobility  Activity Ambulated with assistance in hallway;Ambulated with assistance to bathroom  Level of Assistance Modified independent, requires aide device or extra time  Assistive Device Front wheel walker  Distance Ambulated (ft) 250 ft  Activity Response Tolerated well  Mobility Referral Yes  Mobility visit 1 Mobility  Mobility Specialist Start Time (ACUTE ONLY) 1145  Mobility Specialist Stop Time (ACUTE ONLY) 1202  Mobility Specialist Time Calculation (min) (ACUTE ONLY) 17 min   Pt received in bed and agreeable to mobility. Prior to ambulating, pt requested assistance to the bathroom. After ambulation, pt c/o R sided pain. RN made aware. No other complaints during session. Pt to bed after session with all needs met.   [facility]

## 2024-02-14 NOTE — Progress Notes (Signed)
 PHARMACY - ANTICOAGULATION CONSULT NOTE  Pharmacy Consult for Heparin  Indication: pulmonary embolus  Allergies  Allergen Reactions   Iodine Itching and Other (See Comments)    "lump in tongue when eats seafood"   Latex Itching    GLOVES   Shellfish Allergy Swelling    Patient Measurements: Height: 5\' 5"  (165.1 cm) Weight: 73.5 kg (162 lb) IBW/kg (Calculated) : 57 HEPARIN  DW (KG): 71.9  Vital Signs: Temp: 97.7 F (36.5 C) (05/03 0440) Temp Source: Oral (05/03 0440) BP: 135/74 (05/03 0440) Pulse Rate: 54 (05/03 0440)  Labs: Recent Labs    02/11/24 1729 02/12/24 1529 02/12/24 1652 02/12/24 2134 02/13/24 0521 02/13/24 1108 02/13/24 1843 02/14/24 0534  HGB 14.5  --  13.4  --  12.4 12.9  --  13.1  HCT 43.2  --  41.9  --  37.2 39.0  --  39.9  PLT 439*  --  392  --  356 381  --  374  HEPARINUNFRC  --    < >  --    < > 0.71*  --  0.71* 0.51  CREATININE 0.55  --  1.01*  --   --  0.55  --   --    < > = values in this interval not displayed.    Estimated Creatinine Clearance: 60.1 mL/min (by C-G formula based on SCr of 0.55 mg/dL).   Medical History: Past Medical History:  Diagnosis Date   GERD (gastroesophageal reflux disease) 08/29/2021   Hyperlipidemia    Hypertension    Osteopenia    per DEXA on 07-19-09   Stroke (HCC) 09/14/2007   has mild left leg weakness, saw Dr. Albertina Hugger    Assessment: Pharmacy is consulted to start heparin  drip on 78 yo female diagnosed with acute PE. CT PE study is positive for right upper lobe PE as well as nonocclusive thrombus on the left. Med rec not completed yet, but no indication that pt is on blood thinners or antiplatelet agents PTA     AC/Heme:  HL 0.51 therapeutic on 1050 units/hr CBC WNL No bleeding reported   Goal of Therapy:  Heparin  level 0.3-0.7 units/ml Monitor platelets by anticoagulation protocol: Yes   Plan:  continue IV heparin  at 1050 units/hr  Heparin  level in 8 hours Monitor daily heparin  level, CBC,  signs/symptoms of bleeding   Thank you for allowing pharmacy to be a part of this patient's care.  Beau Bound RPh 02/14/2024, 6:02 AM

## 2024-02-14 NOTE — Discharge Summary (Signed)
 Physician Discharge Summary  Anne [name] ZOX:[id number] DOB: [birth year] DOA: 02/11/2024  PCP: Anne Furth, MD  Admit date: 02/11/2024 Discharge date: 02/14/2024  Admitted From: home Discharge disposition: home   Recommendations for Outpatient Follow-Up:   Be sure patient is UTD on cancer screening New med: eliquis   Discharge Diagnosis:   Principal Problem:   Acute pulmonary embolism (HCC) Active Problems:   Hyperlipidemia   Essential hypertension   GERD (gastroesophageal reflux disease)   Vertigo   Hypokalemia    Discharge Condition: Improved.  Diet recommendation:   Regular.  Wound care: None.  Code status: Full.   History of Present Illness:   Patient is a 78 year old female with past medical history significant for hyperlipidemia, hypertension, osteopenia, history of CVA with mild residual left lower extremity weakness, GERD, history of myositis, vertigo, supraventricular premature beats, history of bradycardia and allergic rhinitis.  Reports having pain involving the lower extremity prior to presentation.  Patient also reported vertigo.  Dental, patient was found to have pulmonary embolism during workup.  Other findings include potassium of 3.3 and serum creatinine of 1.01 (up from 0.55).  Patient is currently on heparin .  Vertigo has resolved.  Leg pain has resolved.    Hospital Course by Problem:   Acute pulmonary embolism (HCC) -eliquis -? Provoked from left leg injury when getting in car? -PCP to ensure UTD on cancer screenings  -treat 3-6 months   Hyperlipidemia Continue ezetimibe  10 mg p.o. daily. Continue rosuvastatin  20 mg p.o. daily.   Essential hypertension Continue amlodipine  10 mg p.o. daily. Continue hydrochlorothiazide  25 mg p.o. daily. Continue losartan  50 mg p.o. daily.   GERD (gastroesophageal reflux disease) Antiacid, H2 blocker or PPI as needed.   Vertigo --resolved   Hypokalemia -repleted    Medical  Consultants:      Discharge Exam:   Vitals:   02/14/24 0440 02/14/24 0936  BP: 135/74 (!) 141/71  Pulse: (!) 54   Resp: 18   Temp: 97.7 F (36.5 C)   SpO2: 97%    Vitals:   02/13/24 1212 02/13/24 2018 02/14/24 0440 02/14/24 0936  BP: (!) 157/80 127/72 135/74 (!) 141/71  Pulse: (!) 57 (!) 42 (!) 54   Resp: 16 18 18    Temp: (!) 97.3 F (36.3 C) 97.7 F (36.5 C) 97.7 F (36.5 C)   TempSrc: Oral Oral Oral   SpO2: 98% 95% 97%   Weight:      Height:        General exam: Appears calm and comfortable.   The results of significant diagnostics from this hospitalization (including imaging, microbiology, ancillary and laboratory) are listed below for reference.     Procedures and Diagnostic Studies:   ECHOCARDIOGRAM COMPLETE Result Date: 02/12/2024    ECHOCARDIOGRAM REPORT   Patient Name:   SUSANNAH [name] Date of Exam: 02/12/2024 Medical Rec #:  [id number]        Height:       65.0 in Accession #:    [id number]       Weight:       162.0 lb Date of Birth:  [birth year]        BSA:          1.809 m Patient Age:    77 years         BP:           123/69 mmHg Patient Gender: F  HR:           63 bpm. Exam Location:  Inpatient Procedure: 2D Echo, Cardiac Doppler and Color Doppler (Both Spectral and Color            Flow Doppler were utilized during procedure). Indications:    I26.02 Pulmonary embolus  History:        Patient has prior history of Echocardiogram examinations, most                 recent 08/07/2022. Abnormal ECG,                 Signs/Symptoms:Dizziness/Lightheadedness; Risk                 Factors:Hypertension and Dyslipidemia.  Sonographer:    Anne [name] RDCS Referring Phys: [id number] Anne [name] IMPRESSIONS  1. Left ventricular ejection fraction, by estimation, is 60 to 65%. Left ventricular ejection fraction by 2D MOD biplane is 65.6 %. The left ventricle has normal function. The left ventricle has no regional wall motion abnormalities. Left ventricular diastolic  parameters are consistent with Grade I diastolic dysfunction (impaired relaxation).  2. Right ventricular systolic function is normal. The right ventricular size is mildly enlarged. There is mildly elevated pulmonary artery systolic pressure. The estimated right ventricular systolic pressure is 45.0 mmHg.  3. Right atrial size was mildly dilated.  4. The mitral valve is normal in structure. Trivial mitral valve regurgitation. No evidence of mitral stenosis. Moderate mitral annular calcification.  5. Tricuspid valve regurgitation is mild to moderate.  6. The aortic valve is tricuspid. There is moderate calcification of the aortic valve. Aortic valve regurgitation is mild. Mild to moderate aortic valve stenosis. Aortic valve area, by VTI measures 1.35 cm. Aortic valve mean gradient measures 17.0 mmHg.  7. The inferior vena cava is dilated in size with <50% respiratory variability, suggesting right atrial pressure of 15 mmHg. FINDINGS  Left Ventricle: Left ventricular ejection fraction, by estimation, is 60 to 65%. Left ventricular ejection fraction by 2D MOD biplane is 65.6 %. The left ventricle has normal function. The left ventricle has no regional wall motion abnormalities. The left ventricular internal cavity size was normal in size. There is no left ventricular hypertrophy. Left ventricular diastolic parameters are consistent with Grade I diastolic dysfunction (impaired relaxation). Right Ventricle: The right ventricular size is mildly enlarged. No increase in right ventricular wall thickness. Right ventricular systolic function is normal. There is mildly elevated pulmonary artery systolic pressure. The tricuspid regurgitant velocity is 2.74 m/s, and with an assumed right atrial pressure of 15 mmHg, the estimated right ventricular systolic pressure is 45.0 mmHg. Left Atrium: Left atrial size was normal in size. Right Atrium: Right atrial size was mildly dilated. Pericardium: There is no evidence of pericardial  effusion. Mitral Valve: The mitral valve is normal in structure. Moderate mitral annular calcification. Trivial mitral valve regurgitation. No evidence of mitral valve stenosis. MV peak gradient, 7.1 mmHg. The mean mitral valve gradient is 2.0 mmHg. Tricuspid Valve: The tricuspid valve is normal in structure. Tricuspid valve regurgitation is mild to moderate. Aortic Valve: The aortic valve is tricuspid. There is moderate calcification of the aortic valve. Aortic valve regurgitation is mild. Aortic regurgitation PHT measures 1100 msec. Mild to moderate aortic stenosis is present. Aortic valve mean gradient measures 17.0 mmHg. Aortic valve peak gradient measures 29.7 mmHg. Aortic valve area, by VTI measures 1.35 cm. Pulmonic Valve: The pulmonic valve was normal in structure. Pulmonic valve regurgitation is trivial. Aorta:  The aortic root is normal in size and structure. Venous: The inferior vena cava is dilated in size with less than 50% respiratory variability, suggesting right atrial pressure of 15 mmHg. IAS/Shunts: No atrial level shunt detected by color flow Doppler.  LEFT VENTRICLE PLAX 2D                        Biplane EF (MOD) LVIDd:         4.80 cm         LV Biplane EF:   Left LVIDs:         3.10 cm                          ventricular LV PW:         1.20 cm                          ejection LV IVS:        1.10 cm                          fraction by LVOT diam:     2.10 cm                          2D MOD LV SV:         83                               biplane is LV SV Index:   46                               65.6 %. LVOT Area:     3.46 cm                                Diastology                                LV e' medial:    5.22 cm/s LV Volumes (MOD)               LV E/e' medial:  14.5 LV vol d, MOD    77.8 ml       LV e' lateral:   4.35 cm/s A2C:                           LV E/e' lateral: 17.4 LV vol d, MOD    48.1 ml A4C: LV vol s, MOD    23.3 ml A2C: LV vol s, MOD    19.0 ml A4C: LV SV MOD A2C:    54.5 ml LV SV MOD A4C:   48.1 ml LV SV MOD BP:    40.6 ml RIGHT VENTRICLE             IVC RV S prime:     15.10 cm/s  IVC diam: 2.40 cm TAPSE (M-mode): 2.8 cm LEFT ATRIUM             Index        RIGHT ATRIUM  Index LA diam:        2.20 cm 1.22 cm/m   RA Area:     19.10 cm LA Vol (A2C):   21.8 ml 12.05 ml/m  RA Volume:   59.20 ml  32.73 ml/m LA Vol (A4C):   33.7 ml 18.63 ml/m LA Biplane Vol: 28.0 ml 15.48 ml/m  AORTIC VALVE                     PULMONIC VALVE AV Area (Vmax):    1.46 cm      PR End Diast Vel: 1.62 msec AV Area (Vmean):   1.40 cm AV Area (VTI):     1.35 cm AV Vmax:           272.33 cm/s AV Vmean:          180.000 cm/s AV VTI:            0.618 m AV Peak Grad:      29.7 mmHg AV Mean Grad:      17.0 mmHg LVOT Vmax:         114.50 cm/s LVOT Vmean:        72.750 cm/s LVOT VTI:          0.240 m LVOT/AV VTI ratio: 0.39 AI PHT:            1100 msec  AORTA Ao Root diam: 2.90 cm Ao Asc diam:  3.40 cm MITRAL VALVE                TRICUSPID VALVE MV Area (PHT): 3.17 cm     TR Peak grad:   30.0 mmHg MV Area VTI:   2.13 cm     TR Vmax:        274.00 cm/s MV Peak grad:  7.1 mmHg MV Mean grad:  2.0 mmHg     SHUNTS MV Vmax:       1.33 m/s     Systemic VTI:  0.24 m MV Vmean:      58.1 cm/s    Systemic Diam: 2.10 cm MV Decel Time: 239 msec MV E velocity: 75.50 cm/s MV A velocity: 112.00 cm/s MV E/A ratio:  0.67 Dalton McleanMD Electronically signed by Archer Bear Signature Date/Time: 02/12/2024/10:21:07 AM    Final    CT Angio Chest PE W and/or Wo Contrast Result Date: 02/12/2024 CLINICAL DATA:  78 year old female with upper lobe pulmonary emboli on CTA head and neck this morning. EXAM: CT ANGIOGRAPHY CHEST WITH CONTRAST TECHNIQUE: Multidetector CT imaging of the chest was performed using the standard protocol during bolus administration of intravenous contrast. Multiplanar CT image reconstructions and MIPs were obtained to evaluate the vascular anatomy. RADIATION DOSE REDUCTION: This exam was  performed according to the departmental dose-optimization program which includes automated exposure control, adjustment of the mA and/or kV according to patient size and/or use of iterative reconstruction technique. CONTRAST:  75mL OMNIPAQUE  IOHEXOL  350 MG/ML SOLN COMPARISON:  CTA head and neck today 0039 hours. No previous chest CTA. FINDINGS: Cardiovascular: Excellent contrast bolus timing in the pulmonary arterial tree. Interestingly, the left upper lobe pulmonary artery filling defects which were most conspicuous at 0039 hours today do not persist. And there is no central or saddle embolus. However, positive for contralateral right upper lobe segmental pulmonary artery thrombus (series 10, image 129), more nonocclusive clot in the left lower lobe superior segment (series 10, image 147). Heart size at the upper limits of normal. No pericardial effusion. Calcified aortic atherosclerosis.  No contrast in the aorta on this exam. Calcified coronary artery atherosclerosis on series 10, image 175. Mediastinum/Nodes: Negative for mediastinal mass or lymphadenopathy. Lungs/Pleura: Major airways are patent. Lung volumes somewhat lower than that on the earlier neck CTA. Patchy dependent and bilateral lung base opacity most resembling atelectasis. No pleural effusion. No convincing pulmonary infarct. No consolidation. Upper Abdomen: Contrast being excreted from both kidneys. Small but circumscribed low-density area in the lateral right hepatic lobe series 4 image 115, compatible with benign etiology such as cyst or hemangioma (no follow-up imaging recommended). Negative visible spleen, pancreas, adrenal glands and bowel in the upper abdomen. Musculoskeletal: No acute or suspicious osseous lesion identified. Mild for age thoracic spine degeneration. Review of the MIP images confirms the above findings. IMPRESSION: 1. The study is Positive for Right Upper Lobe Segmental Pulmonary Embolus, and more non-occlusive appearing  thrombus in the left lower lobe superior segment. However, the left upper lobe PE seen at 0039 hours today has resolved. No central or saddle embolus. 2. No pulmonary infarct or pleural effusion. Lower lung volumes with patchy atelectasis. 3. Calcified coronary artery, Aortic Atherosclerosis (ICD10-I70.0). Electronically Signed   By: Marlise Simpers M.D.   On: 02/12/2024 06:07   CT Angio Head Neck W WO CM Result Date: 02/12/2024 CLINICAL DATA:  Initial evaluation for acute vertigo. MRI from 11/22/2023 EXAM: CT ANGIOGRAPHY HEAD AND NECK WITH AND WITHOUT CONTRAST TECHNIQUE: Multidetector CT imaging of the head and neck was performed using the standard protocol during bolus administration of intravenous contrast. Multiplanar CT image reconstructions and MIPs were obtained to evaluate the vascular anatomy. Carotid stenosis measurements (when applicable) are obtained utilizing NASCET criteria, using the distal internal carotid diameter as the denominator. RADIATION DOSE REDUCTION: This exam was performed according to the departmental dose-optimization program which includes automated exposure control, adjustment of the mA and/or kV according to patient size and/or use of iterative reconstruction technique. CONTRAST:  75mL OMNIPAQUE  IOHEXOL  350 MG/ML SOLN COMPARISON:  None Available. FINDINGS: CT HEAD FINDINGS Brain: Mild cerebral atrophy with chronic microvascular ischemic disease. No acute intracranial hemorrhage. No acute large vessel territory infarct. No mass lesion or midline shift. Mild ventricular prominence related global parenchymal volume loss without hydrocephalus. No extra-axial fluid collection. Vascular: No abnormal hyperdense vessel. Calcified atherosclerosis present at the skull base. Skull: Scalp soft tissues demonstrate no acute finding. Calvarium intact. Sinuses/Orbits: Right gaze noted. Paranasal sinuses and mastoid air cells are clear. Other: None. Review of the MIP images confirms the above findings CTA  NECK FINDINGS Aortic arch: Visualized arch within normal limits for caliber standard branch pattern. Mild aortic atherosclerosis. No stenosis about the origin the great vessels. Right carotid system: Right common and internal carotid arteries are tortuous but patent without dissection. Mild atheromatous change about the right carotid bulb without stenosis. Left carotid system: Left common and internal carotid arteries are tortuous but patent without dissection. No hemodynamically significant stenosis about the left carotid artery system. Vertebral arteries: Left vertebral artery arises directly from the aortic arch. Vertebral arteries are tortuous but patent without stenosis or dissection. Skeleton: Bones are diffusely osteopenic. There is question of superimposed scattered lucent lesions within the visualized osseous structures. While this may be related to osteopenia, possible myeloma could also have this appearance. Mild-to-moderate spondylosis at C5-6 and C6-7. Patient is edentulous. Other neck: No other acute finding. Few small thyroid  nodules measuring up to 1 cm, of doubtful significance given size and patient age, no follow-up imaging recommended (ref: J Am Coll Radiol. 2015  Feb;12(2): 143-50). Upper chest: Suspected subocclusive filling defect within a left upper lobe segmental pulmonary artery (series 12, image 4), suspicious for pulmonary embolism. No other visible acute findings. Review of the MIP images confirms the above findings CTA HEAD FINDINGS Anterior circulation: Atheromatous change about the carotid siphons without hemodynamically significant stenosis. A1 segments patent without stenosis. Right A1 dominant. Normal anterior communicating artery complex. Atheromatous change about the ACAs bilaterally with focal severe right A2/A3 stenosis (series 14, image 195). Atheromatous irregularity about the M1 segments bilaterally. Moderate stenosis at the distal left M1 segment (series 17, image 40). Left  MCA bifurcation is ectatic without discrete aneurysm. MCA branches are patent and perfused bilaterally. Diffuse small vessel atheromatous irregularity. Posterior circulation: Dominant left V4 segment patent without significant stenosis. Atheromatous change about the right V4 segment with severe stenosis just prior to the vertebrobasilar junction (series 14, image 176). Neither PICA origin well visualized. Atheromatous irregularity about the basilar with moderate to severe stenoses about the mid-distal basilar artery (series 18, image 22). Superior cerebral arteries patent bilaterally. Basilar tip ectatic without discrete aneurysm. Both PCAs primarily supplied via the basilar. Atheromatous irregularity about the PCAs without proximal high-grade stenosis. Venous sinuses: Grossly patent allowing for timing the contrast bolus. Anatomic variants: As above.  No aneurysm. Review of the MIP images confirms the above findings IMPRESSION: CT HEAD: 1. No acute intracranial abnormality. 2. Mild cerebral atrophy with chronic microvascular ischemic disease. CTA HEAD AND NECK: 1. Negative CTA for large vessel occlusion or other emergent finding. 2. Extensive intracranial atherosclerotic disease with resultant severe right A2/A3 stenosis, moderate left M1 stenosis, severe right V4 stenosis just prior to the vertebrobasilar junction, with moderate to severe stenoses about the mid-distal basilar artery. 3. Diffuse tortuosity of the major arterial vasculature of the head and neck, suggesting chronic underlying hypertension. 4. Suspected subocclusive filling defect within a left upper lobe segmental pulmonary artery, suspicious for pulmonary embolism. Further evaluation with dedicated chest CT recommended. 5. Diffuse osteopenia with question of superimposed scattered lucent lesions within the visualized osseous structures. While this may be related to osteopenia, possible myeloma could also have this appearance. Correlation with  laboratory values recommended. 6.  Aortic Atherosclerosis (ICD10-I70.0). Critical Value/emergent results were called by telephone at the time of interpretation on 02/12/2024 at 1:36 am to provider Dr Carylon Claude, who verbally acknowledged these results. Electronically Signed   By: Virgia Griffins M.D.   On: 02/12/2024 01:39     Labs:   Basic Metabolic Panel: Recent Labs  Lab 02/11/24 1729 02/12/24 1652 02/13/24 1108  NA 136 134* 134*  K 3.1* 3.3* 3.4*  CL 98 99 98  CO2 26 24 28   GLUCOSE 144* 96 144*  BUN 13 15 16   CREATININE 0.55 1.01* 0.55  CALCIUM  9.1 8.7* 8.8*  MG  --  2.2  --   PHOS  --  3.0 3.1   GFR Estimated Creatinine Clearance: 60.1 mL/min (by C-G formula based on SCr of 0.55 mg/dL). Liver Function Tests: Recent Labs  Lab 02/11/24 1729 02/13/24 1108  AST 31  --   ALT 25  --   ALKPHOS 80  --   BILITOT 0.7  --   PROT 8.1  --   ALBUMIN 4.1 3.4*   Recent Labs  Lab 02/11/24 1729  LIPASE 26   No results for input(s): "AMMONIA" in the last 168 hours. Coagulation profile No results for input(s): "INR", "PROTIME" in the last 168 hours.  CBC: Recent Labs  Lab 02/11/24 1729 02/12/24  1652 02/13/24 0521 02/13/24 1108 02/14/24 0534  WBC 14.1* 12.3* 9.7 9.3 10.3  HGB 14.5 13.4 12.4 12.9 13.1  HCT 43.2 41.9 37.2 39.0 39.9  MCV 90.0 94.8 92.1 90.9 91.9  PLT 439* 392 356 381 374   Cardiac Enzymes: No results for input(s): "CKTOTAL", "CKMB", "CKMBINDEX", "TROPONINI" in the last 168 hours. BNP: Invalid input(s): "POCBNP" CBG: No results for input(s): "GLUCAP" in the last 168 hours. D-Dimer No results for input(s): "DDIMER" in the last 72 hours. Hgb A1c No results for input(s): "HGBA1C" in the last 72 hours. Lipid Profile No results for input(s): "CHOL", "HDL", "LDLCALC", "TRIG", "CHOLHDL", "LDLDIRECT" in the last 72 hours. Thyroid  function studies No results for input(s): "TSH", "T4TOTAL", "T3FREE", "THYROIDAB" in the last 72 hours.  Invalid input(s):  "FREET3" Anemia work up No results for input(s): "VITAMINB12", "FOLATE", "FERRITIN", "TIBC", "IRON", "RETICCTPCT" in the last 72 hours. Microbiology No results found for this or any previous visit (from the past 240 hours).   Discharge Instructions:   Discharge Instructions     Diet general   Complete by: As directed    Increase activity slowly   Complete by: As directed       Allergies as of 02/14/2024       Reactions   Iodine Itching, Other (See Comments)   "lump in tongue when eats seafood"   Latex Itching   GLOVES   Shellfish Allergy Swelling        Medication List     STOP taking these medications    aspirin 81 MG tablet       TAKE these medications    acetaminophen  325 MG tablet Commonly known as: TYLENOL  Take 650 mg by mouth every 6 (six) hours as needed for mild pain.   amLODipine  10 MG tablet Commonly known as: NORVASC  Take 1 tablet (10 mg total) by mouth daily.   Apixaban Starter Pack (10mg  and 5mg ) Commonly known as: ELIQUIS STARTER PACK Take as directed on package: start with two-5mg  tablets twice daily for 7 days. On day 8, switch to one-5mg  tablet twice daily.   ascorbic acid 500 MG tablet Commonly known as: VITAMIN C Take 500 mg by mouth daily.   CALTRATE 600 PLUS-VIT D PO Take 1 tablet by mouth daily.   CENTRUM SILVER PO Take 1 tablet by mouth daily.   ezetimibe  10 MG tablet Commonly known as: ZETIA  Take 1 tablet (10 mg total) by mouth daily.   Fluad 0.5 ML injection Generic drug: influenza vaccine adjuvanted Inject into the muscle.   hydrochlorothiazide  25 MG tablet Commonly known as: HYDRODIURIL  TAKE 1 TABLET BY MOUTH EVERY DAY   ketoconazole  2 % shampoo Commonly known as: NIZORAL  Apply 1 Application topically 2 (two) times a week.   Klor-Con  M20 20 MEQ tablet Generic drug: potassium chloride  SA TAKE 1 TABLET BY MOUTH EVERY DAY   loratadine  10 MG tablet Commonly known as: CLARITIN  Take 1 tablet (10 mg total) by  mouth daily.   losartan  50 MG tablet Commonly known as: COZAAR  Take 1 tablet (50 mg total) by mouth daily.   meclizine  25 MG tablet Commonly known as: ANTIVERT  Take 1 tablet (25 mg total) by mouth every other day as needed for dizziness.   ondansetron  4 MG disintegrating tablet Commonly known as: ZOFRAN -ODT Take 1 tablet (4 mg total) by mouth every 8 (eight) hours as needed for nausea or vomiting.   rosuvastatin  10 MG tablet Commonly known as: CRESTOR  Take 1 tablet (10 mg total) by mouth  daily. What changed: how much to take        Follow-up Information     Anne Furth, MD Follow up in 1 week(s).   Specialty: Family Medicine Why: BMP Contact information: [address] [place] [address] [phone]                  Time coordinating discharge: 45 min  Signed:  Enrigue Harvard DO  [facility] 02/14/2024, 11:52 AM

## 2024-02-17 ENCOUNTER — Telehealth: Payer: Self-pay

## 2024-02-17 NOTE — Telephone Encounter (Signed)
 Copied from CRM [id number]. Topic: Clinical - Lab/Test Results >> Feb 17, 2024 10:23 AM Artemio Larry wrote: Reason for CRM: Patient requested call back regarding mammogram results at [phone] or [phone]

## 2024-02-17 NOTE — Telephone Encounter (Signed)
 Attempted to call pt no way of leaving a message will try again in the morning

## 2024-02-18 ENCOUNTER — Encounter: Payer: Self-pay | Admitting: Family Medicine

## 2024-02-18 ENCOUNTER — Ambulatory Visit (INDEPENDENT_AMBULATORY_CARE_PROVIDER_SITE_OTHER): Admitting: Family Medicine

## 2024-02-18 VITALS — BP 124/60 | HR 63 | Temp 97.4°F | Wt 162.6 lb

## 2024-02-18 DIAGNOSIS — K56609 Unspecified intestinal obstruction, unspecified as to partial versus complete obstruction: Secondary | ICD-10-CM | POA: Diagnosis not present

## 2024-02-18 DIAGNOSIS — R42 Dizziness and giddiness: Secondary | ICD-10-CM | POA: Diagnosis not present

## 2024-02-18 DIAGNOSIS — I2699 Other pulmonary embolism without acute cor pulmonale: Secondary | ICD-10-CM | POA: Diagnosis not present

## 2024-02-18 NOTE — Telephone Encounter (Signed)
 Pt is aware of the mammogram results

## 2024-02-18 NOTE — Progress Notes (Signed)
 Subjective:    Patient ID: Anne [name], female    DOB: Jun 07, 1946, 78 y.o.   MRN: [id number]  HPI Here to follow up on a hospital stay from 02-11-24 to 02-14-24. She presented to the ED primarily complaining of severe vertigo and vomiting which had started about 5 hours prior to her arrival. Incidentally she had also been dealing with intermittent severe pains in the right leg for the previous 3 weeks. The pain would start in the right buttock and radiate down to the foot. There was no swelling in the leg or foot. No chest pain or SOB. She had been seen in our clinic several times for the leg pain, and it was felt to be of a musculoskeletal origin. In the ED she was given IV fluids and anti-nausea medication, and the vertigo and vomiting stopped. At the same time the pain in her right leg suddenly went away. As part of her workup for the vertigo she had CT scans of her head and chest. The head CCT was unremarkable, but the chest CT revealed a large RUL PE and a smaller LLL PE. Her labs were unremarkable including a creatinine of 0.55. Her ECHO showed an EF of 60-65% with Grade I diastolic dysfunction and mild to moderate AV stenosis. Her EKG was fine. In retrospect I believe her right leg pain had been caused by a DVT which broke free and went to her lungs while she was waiting in the ED. The vertigo and vomiting were totally unrelated to the thrombus. She eventually had dopplers on both legs but these were negative for DVT's. She was started on Eliquis to take 20 mg BID for 7 days and then change to 10 mg BID. Today she feel back top normal with no leg pain or SOB or chest pain.    Review of Systems  Constitutional: Negative.   Respiratory: Negative.    Cardiovascular: Negative.   Gastrointestinal: Negative.   Neurological: Negative.        Objective:   Physical Exam Constitutional:      Appearance: Normal appearance.  Cardiovascular:     Rate and Rhythm: Normal rate and regular rhythm.      Pulses: Normal pulses.     Heart sounds: Normal heart sounds.  Pulmonary:     Effort: Pulmonary effort is normal.     Breath sounds: Normal breath sounds.  Musculoskeletal:     Comments: Trace edema in both ankles   Neurological:     Mental Status: She is alert and oriented to person, place, and time. Mental status is at baseline.           Assessment & Plan:  She has two pulmonary emboli which likely originated from a DVT in the right thigh or pelvis. She is stable now and is anticoagulated with Eliquis. I suspect she will need to stay on this the rest of her life. She also had a bout of vertigo with vomiting, and this has resolved. We do not have an explanation for the origin of this thrombus as she has not had any recent trauma or immobilizations. She is concerned that she could have a cancer somewhere in her body. As noted she just had CT scans of the head and chest that were clear of cancer. She keeps up with mammogram, and these have been normal. Her last colonoscopy was clear but this was well over 10 years ago. We agreed to proceed with a CT scan of the abdomen and  pelvis to complete a whole body workup. She will follow up with us  in 2 weeks. We spent a total of ( 35  ) minutes reviewing records and discussing these issues.  Corita Diego, MD   Corita Diego, MD

## 2024-02-19 ENCOUNTER — Telehealth: Payer: Self-pay

## 2024-02-19 NOTE — Telephone Encounter (Signed)
 Spoke with pt about allergies. Pt has never had any issues with Iodinated Contrast and no documented allergy to Iodinated Contrast. No 13 hr prep needed.

## 2024-02-25 ENCOUNTER — Ambulatory Visit: Admitting: Internal Medicine

## 2024-02-25 ENCOUNTER — Telehealth: Payer: Self-pay | Admitting: *Deleted

## 2024-02-25 NOTE — Telephone Encounter (Signed)
 Copied from CRM [id number]. Topic: Clinical - Medical Advice >> Feb 25, 2024  2:44 PM [place] J wrote: Reason for CRM: Patient would like to speak with Dr. Leonce Ralph nurse in regards to some questions she has about her medical condition.

## 2024-02-25 NOTE — Telephone Encounter (Signed)
 Spoke with pt offered appointment for 02/26/24 at 11 am with Dr Ival Marines due to PCP not being available.Pt advised to got to the ED if pain and symptoms gets worse, pt verbalized understanding

## 2024-02-26 ENCOUNTER — Ambulatory Visit (INDEPENDENT_AMBULATORY_CARE_PROVIDER_SITE_OTHER): Admitting: Internal Medicine

## 2024-02-26 ENCOUNTER — Encounter: Payer: Self-pay | Admitting: Internal Medicine

## 2024-02-26 VITALS — BP 110/66 | HR 85 | Temp 97.7°F | Wt 162.2 lb

## 2024-02-26 DIAGNOSIS — M79604 Pain in right leg: Secondary | ICD-10-CM

## 2024-02-26 NOTE — Progress Notes (Signed)
 Established Patient Office Visit     CC/Reason for Visit: Right leg pain  HPI: Anne [name] is a 78 y.o. female who is coming in today for the above mentioned reasons.  She was recently diagnosed in the emergency department with 2 pulmonary embolisms.  Her Dopplers of her lower legs did not show evidence of DVT.  She has been having right lower leg pain for some time.  She has been compliant with Eliquis  she just dropped down from the initial 7-day dosing of 20 mg twice daily to 10 mg twice daily.   Past Medical/Surgical History: Past Medical History:  Diagnosis Date   GERD (gastroesophageal reflux disease) 08/29/2021   Hyperlipidemia    Hypertension    Osteopenia    per DEXA on 07-19-09   Stroke (HCC) 09/14/2007   has mild left leg weakness, saw Dr. Albertina Hugger    History reviewed. No pertinent surgical history.  Social History:  reports that she has never smoked. She has never used smokeless tobacco. She reports that she does not currently use alcohol. She reports that she does not use drugs.  Allergies: Allergies  Allergen Reactions   Iodine Itching and Other (See Comments)    "lump in tongue when eats seafood"   Latex Itching    GLOVES   Shellfish Allergy Swelling    Family History:  Family History  Family history unknown: Yes     Current Outpatient Medications:    acetaminophen  (TYLENOL ) 325 MG tablet, Take 650 mg by mouth every 6 (six) hours as needed for mild pain., Disp: , Rfl:    amLODipine  (NORVASC ) 10 MG tablet, Take 1 tablet (10 mg total) by mouth daily., Disp: 90 tablet, Rfl: 3   APIXABAN  (ELIQUIS ) VTE STARTER PACK (10MG  AND 5MG ), Take as directed on package: start with two-5mg  tablets twice daily for 7 days. On day 8, switch to one-5mg  tablet twice daily., Disp: 74 each, Rfl: 0   Calcium -Vitamin D (CALTRATE 600 PLUS-VIT D PO), Take 1 tablet by mouth daily., Disp: , Rfl:    ezetimibe  (ZETIA ) 10 MG tablet, Take 1 tablet (10 mg total) by mouth  daily., Disp: 90 tablet, Rfl: 3   hydrochlorothiazide  (HYDRODIURIL ) 25 MG tablet, TAKE 1 TABLET BY MOUTH EVERY DAY, Disp: 90 tablet, Rfl: 0   influenza vaccine adjuvanted (FLUAD) 0.5 ML injection, Inject into the muscle., Disp: 0.5 mL, Rfl: 0   ketoconazole  (NIZORAL ) 2 % shampoo, Apply 1 Application topically 2 (two) times a week., Disp: 120 mL, Rfl: 5   KLOR-CON  M20 20 MEQ tablet, TAKE 1 TABLET BY MOUTH EVERY DAY, Disp: 90 tablet, Rfl: 1   loratadine  (CLARITIN ) 10 MG tablet, Take 1 tablet (10 mg total) by mouth daily., Disp: 30 tablet, Rfl: 11   losartan  (COZAAR ) 50 MG tablet, Take 1 tablet (50 mg total) by mouth daily., Disp: 90 tablet, Rfl: 3   meclizine  (ANTIVERT ) 25 MG tablet, Take 1 tablet (25 mg total) by mouth every other day as needed for dizziness., Disp: 60 tablet, Rfl: 2   Multiple Vitamins-Minerals (CENTRUM SILVER PO), Take 1 tablet by mouth daily., Disp: , Rfl:    ondansetron  (ZOFRAN -ODT) 4 MG disintegrating tablet, Take 1 tablet (4 mg total) by mouth every 8 (eight) hours as needed for nausea or vomiting., Disp: 10 tablet, Rfl: 0   rosuvastatin  (CRESTOR ) 10 MG tablet, Take 1 tablet (10 mg total) by mouth daily. (Patient taking differently: Take 20 mg by mouth daily.), Disp: 90 tablet, Rfl: 3  vitamin C (ASCORBIC ACID) 500 MG tablet, Take 500 mg by mouth daily., Disp: , Rfl:   Review of Systems:  Negative unless indicated in HPI.   Physical Exam: Vitals:   02/26/24 1113  BP: 110/66  Pulse: 85  Temp: 97.7 F (36.5 C)  TempSrc: Oral  SpO2: 98%  Weight: 162 lb 3.2 oz (73.6 kg)    Body mass index is 26.99 kg/m.   Physical Exam Musculoskeletal:        General: No swelling or tenderness.     Right lower leg: No edema.     Left lower leg: No edema.      Impression and Plan:  Right leg pain   There is no erythema or swelling of the legs.  She recently had Dopplers that were negative for DVT.  She is she is already fully anticoagulated on Eliquis  I do not think we  need to rule her out for DVT at this time.  Time spent:21 minutes reviewing chart, interviewing and examining patient and formulating plan of care.     Marguerita Shih, MD [facility]

## 2024-02-27 ENCOUNTER — Ambulatory Visit
Admission: RE | Admit: 2024-02-27 | Discharge: 2024-02-27 | Disposition: A | Discharge status: Home / Self Care | Source: Ambulatory Visit | Attending: Family Medicine | Admitting: Family Medicine

## 2024-02-27 DIAGNOSIS — K56609 Unspecified intestinal obstruction, unspecified as to partial versus complete obstruction: Secondary | ICD-10-CM

## 2024-02-27 MED ORDER — IOPAMIDOL (ISOVUE-370) INJECTION 76%
80.0000 mL | Freq: Once | INTRAVENOUS | Status: AC | PRN
Start: 2024-02-27 — End: 2024-02-27
  Administered 2024-02-27: 80 mL via INTRAVENOUS

## 2024-03-02 ENCOUNTER — Ambulatory Visit: Payer: Self-pay | Admitting: Family Medicine

## 2024-03-03 ENCOUNTER — Ambulatory Visit (INDEPENDENT_AMBULATORY_CARE_PROVIDER_SITE_OTHER)

## 2024-03-03 ENCOUNTER — Ambulatory Visit (INDEPENDENT_AMBULATORY_CARE_PROVIDER_SITE_OTHER): Admitting: Family Medicine

## 2024-03-03 ENCOUNTER — Encounter: Payer: Self-pay | Admitting: Family Medicine

## 2024-03-03 VITALS — BP 124/72 | HR 63 | Temp 97.6°F | Wt 159.0 lb

## 2024-03-03 DIAGNOSIS — I2699 Other pulmonary embolism without acute cor pulmonale: Secondary | ICD-10-CM | POA: Diagnosis not present

## 2024-03-03 DIAGNOSIS — M79604 Pain in right leg: Secondary | ICD-10-CM

## 2024-03-03 DIAGNOSIS — M1611 Unilateral primary osteoarthritis, right hip: Secondary | ICD-10-CM | POA: Diagnosis not present

## 2024-03-03 DIAGNOSIS — M25551 Pain in right hip: Secondary | ICD-10-CM

## 2024-03-03 MED ORDER — APIXABAN 5 MG PO TABS: 5.0000 mg | ORAL_TABLET | Freq: Two times a day (BID) | ORAL | 3 refills | Status: AC

## 2024-03-03 NOTE — Progress Notes (Signed)
   Subjective:    Patient ID: Anne [name], female    DOB: 1945/10/21, 78 y.o.   MRN: [id number]  HPI Here to follow up on a recent diagnosis of bilateral Pe's which were found during a hospital stay from 01-15-24 to 02-14-24. She was started on Eliquis , and she is now on a maintenance dose of 5 mg BID. After our last visit we sent her for a CT of the abdomen and pelvis, and this returned as normal. Today she also complains again of pain in the right hip and leg. This started almost 3 months ago with no known trauma. We had referred her to Sports Medicine for this, but since the pain went away while she was in the hospital, this was cancelled. Now she says the same pain has flared up again in the past few days. The pain starts in the right hip and buttock region and it radiates to the right thigh and calf. No swelling. She is taking Tylenol  and this helps somewhat. Today this pain has improved.    Review of Systems  Constitutional: Negative.   Respiratory: Negative.    Cardiovascular: Negative.   Musculoskeletal:  Positive for arthralgias.       Objective:   Physical Exam Constitutional:      Appearance: Normal appearance.     Comments: She walks with no obvious pain   Cardiovascular:     Rate and Rhythm: Normal rate and regular rhythm.     Pulses: Normal pulses.     Heart sounds: Normal heart sounds.  Pulmonary:     Effort: Pulmonary effort is normal.     Breath sounds: Normal breath sounds.  Musculoskeletal:     Comments: The right buttock and hip are normal on exam with full ROM and no tenderness. Likewise the entire right leg is normal on exam   Neurological:     Mental Status: She is alert.           Assessment & Plan:  Intermittent right hip and leg pain. We will place a new referral to Sports Medicine,  and we will send her for Xrays of the pelvis and the right hip today.  Corita Diego, MD

## 2024-03-04 ENCOUNTER — Ambulatory Visit: Payer: Self-pay

## 2024-03-04 NOTE — Telephone Encounter (Signed)
 Chief Complaint: cough  Symptoms: cough, sneezing, runny nose. Frequency: yesterday Pertinent Negatives: Patient denies sob Disposition: [] ED /[] Urgent Care (no appt availability in office) / [] Appointment(In office/virtual)/ []  [facility]/ [x] Home Care/ [] Refused Recommended Disposition /[] [place] [facility]/ [x]  Follow-up with PCP Additional Notes: pt states that on yesterday after her dr appt she developed some hotness and  and she started coughing, runny nose and sneezing.  States she is currently taking Claritin . States that she is wondering about taking Claritin  since starting on the Eliquis . States her temp was 99 yesterday but has gone back down. States that she did take a Claritin  this morning. States that she does have some cough and cold medication for hypertension.   Copied From CRM [id number]. Reason for Triage: Patient having cold symptoms,  she is taking Claritin , still coughing and sneezing  and itchy watery eyes. She is taking a new  medication and wanting to know what else she  can take to help.   Reason for Disposition  Cough with cold symptoms (e.g., runny nose, postnasal drip, throat clearing)  Protocols used: Cough - Acute Non-Productive-A-AH

## 2024-03-05 ENCOUNTER — Other Ambulatory Visit: Payer: Self-pay | Admitting: Family Medicine

## 2024-03-05 MED ORDER — HYDROCODONE BIT-HOMATROP MBR 5-1.5 MG/5ML PO SOLN: 5.0000 mL | ORAL | 0 refills | Status: DC | PRN

## 2024-03-05 NOTE — Progress Notes (Signed)
 I sent in the cough syrup. She can safely take Claritin  with Eliquis 

## 2024-03-05 NOTE — Telephone Encounter (Signed)
 Notified pt that new Rx was sent to her pharmacy

## 2024-03-05 NOTE — Telephone Encounter (Signed)
 Attempted to return pt call with no success. Will try again later

## 2024-03-05 NOTE — Telephone Encounter (Signed)
 Done

## 2024-03-05 NOTE — Telephone Encounter (Signed)
 Spoke with pt states that she has a dry cough sneezing watery eyes and a running nose. Pt states that she need something sent to help her cough especially at night. Please advise

## 2024-03-11 ENCOUNTER — Ambulatory Visit: Admitting: Family Medicine

## 2024-03-11 NOTE — Progress Notes (Deleted)
   Joanna Muck, PhD, LAT, ATC acting as a scribe for Garlan Juniper, MD.  KRISTIANNE ALBIN is a 78 y.o. female who presents to [facility] at [facility] today for R leg pain ongoing for over a month. She is unsure about injury, but recalls losing her balance and stepping off of a curb oddly. Pt locates pain to ***  Low back pain: Radiating pain: LE numbness/tingling: LE weakness: Aggravates: Treatments tried: Tylenol , sleeping w/ a pillow under her legs  Dx imaging: 03/03/24 R hip XR  Pertinent review of systems: ***  Relevant historical information: ***   Exam:  There were no vitals taken for this visit. General: Well Developed, well nourished, and in no acute distress.   MSK: ***    Lab and Radiology Results No results found for this or any previous visit (from the past 72 hours). No results found.     Assessment and Plan: 78 y.o. female with ***   PDMP not reviewed this encounter. No orders of the defined types were placed in this encounter.  No orders of the defined types were placed in this encounter.    Discussed warning signs or symptoms. Please see discharge instructions. Patient expresses understanding.   ***

## 2024-03-12 ENCOUNTER — Telehealth: Payer: Self-pay | Admitting: Family Medicine

## 2024-03-12 ENCOUNTER — Ambulatory Visit: Admitting: Family Medicine

## 2024-03-12 VITALS — BP 148/88 | HR 62 | Ht 65.0 in | Wt 160.0 lb

## 2024-03-12 DIAGNOSIS — M5416 Radiculopathy, lumbar region: Secondary | ICD-10-CM | POA: Diagnosis not present

## 2024-03-12 DIAGNOSIS — G5701 Lesion of sciatic nerve, right lower limb: Secondary | ICD-10-CM

## 2024-03-12 NOTE — Patient Instructions (Addendum)
 Thank you for coming in today.   I've referred you to Physical Therapy here, at this office.  You can schedule your 1st visit at the check-out desk before you leave today.   I can prescribed medicines if needed. Just let me know  Check back in 6 weeks

## 2024-03-12 NOTE — Telephone Encounter (Signed)
 Patient called stating that she would like to proceed with the medication that was discussed at her visit today to help with her pain.  [facility] [place]

## 2024-03-12 NOTE — Progress Notes (Signed)
   Joanna Muck, PhD, LAT, ATC acting as a scribe for Garlan Juniper, MD.  Anne [name] is a 78 y.o. female who presents to [facility] at [facility] today for R leg pain ongoing for over a month. She is unsure about injury, but recalls losing her balance and stepping off of a curb oddly. Pt locates pain to from her R buttock/low back through the whole R leg.   Low back pain: yes- into R buttock Radiating pain: yes- whole R leg LE numbness/tingling: yes LE weakness: yes Aggravates: walking Treatments tried: Tylenol , sleeping w/ a pillow under her legs, compression stockings  Dx imaging: 03/03/24 R hip XR  Pertinent review of systems: No fevers or chills  Relevant historical information: History of a pulmonary embolism on anticoagulation.   Exam:  BP (!) 148/88   Pulse 62   Ht 5\' 5"  (1.651 m)   Wt 160 lb (72.6 kg)   SpO2 99%   BMI 26.63 kg/m  General: Well Developed, well nourished, and in no acute distress.   MSK: L-spine nontender palpation spinal midline.  Decreased lumbar motion. Right hip normal-appearing normal motion.  Nontender to palpation at greater trochanter. Lower extremity strength decreased right hip flexion and knee extension 4+/5 otherwise intact throughout. Reflexes are equal and normal bilaterally.    Lab and Radiology Results  CT scan images of lumbar spine and pelvis obtained on CT scan abdomen and pelvis dated Feb 27, 2024 personally and independently interpreted. Mild lumbar degeneration visible.  No significant DJD.  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      Assessment and Plan: 78 y.o. female with right buttocks pain radiating down the right leg in an S1 dermatomal pattern.  She does have some weakness to hip flexion and knee extension which does not fit the S1 dermatome pattern.  I would expect more L3 nerve root issues with that weakness pattern.  She is a good candidate for physical therapy.  Plan  for trial of physical therapy and recheck in about 6 weeks.  Consider prednisone and gabapentin if needed.  Right now her symptoms are not bothersome enough for her to take that medicine.  Differential diagnosis includes sciatica and/or piriformis syndrome.   PDMP not reviewed this encounter. Orders Placed This Encounter  Procedures   Ambulatory referral to Physical Therapy    Referral Priority:   Routine    Referral Type:   Physical Medicine    Referral Reason:   Specialty Services Required    Requested Specialty:   Physical Therapy    Number of Visits Requested:   1   No orders of the defined types were placed in this encounter.    Discussed warning signs or symptoms. Please see discharge instructions. Patient expresses understanding.   The above documentation has been reviewed and is accurate and complete Garlan Juniper, M.D.

## 2024-03-15 ENCOUNTER — Ambulatory Visit: Payer: Self-pay | Admitting: Family Medicine

## 2024-03-15 ENCOUNTER — Ambulatory Visit: Admitting: Family Medicine

## 2024-03-15 DIAGNOSIS — E782 Mixed hyperlipidemia: Secondary | ICD-10-CM

## 2024-03-15 DIAGNOSIS — I10 Essential (primary) hypertension: Secondary | ICD-10-CM

## 2024-03-15 DIAGNOSIS — I2699 Other pulmonary embolism without acute cor pulmonale: Secondary | ICD-10-CM

## 2024-03-16 MED ORDER — GABAPENTIN 100 MG PO CAPS: 100.0000 mg | ORAL_CAPSULE | Freq: Every evening | ORAL | 3 refills | Status: DC | PRN

## 2024-03-16 NOTE — Telephone Encounter (Signed)
 I believe that was gabapentin.  [facility] [place].

## 2024-03-17 NOTE — Telephone Encounter (Signed)
 Pt also asking about rx for Prednisone, which was mentioned at her visit. Note sure if she should get a prescription for that as well, also wonders if it is OK to take at the same time.   Forwarding to Dr. Alease Hunter to review and advise.   Per visit note:   Assessment and Plan: 78 y.o. female with right buttocks pain radiating down the right leg in an S1 dermatomal pattern.  She does have some weakness to hip flexion and knee extension which does not fit the S1 dermatome pattern.  I would expect more L3 nerve root issues with that weakness pattern.  She is a good candidate for physical therapy.  Plan for trial of physical therapy and recheck in about 6 weeks.  Consider prednisone and gabapentin if needed.  Right now her symptoms are not bothersome enough for her to take that medicine.   Differential diagnosis includes sciatica and/or piriformis syndrome.

## 2024-03-18 ENCOUNTER — Telehealth: Payer: Self-pay | Admitting: *Deleted

## 2024-03-18 MED ORDER — PREDNISONE 50 MG PO TABS: 50.0000 mg | ORAL_TABLET | Freq: Every day | ORAL | 0 refills | Status: DC

## 2024-03-18 NOTE — Telephone Encounter (Signed)
 Called pt and advised. She will call back with any questions or concerns,   Normand Beckwith, CMA

## 2024-03-18 NOTE — Telephone Encounter (Signed)
 I did not prescribe prednisone during the visit.  Based on your phone call I have just sent it to the pharmacy.  You can take it with your current medications if needed.

## 2024-03-18 NOTE — Progress Notes (Signed)
 Care Guide Pharmacy Note  03/18/2024 Name: [name] MRN: [id number] DOB: [birth year]  Referred By: Donley Furth, MD Reason for referral: Complex Care Management (Outreach to schedule referral with pharmacist )   Anne [name] is a 78 y.o. year old female who is a primary care patient of Donley Furth, MD.  Laird Pih was referred to the pharmacist for assistance related to: Eliquis   Successful contact was made with the patient to discuss pharmacy services including being ready for the pharmacist to call at least 5 minutes before the scheduled appointment time and to have medication bottles and any blood pressure readings ready for review. The patient agreed to meet with the pharmacist via telephone visit on 03/22/2024  Kandis Ormond, CMA [place]  [facility], [facility] Guide Direct Dial: [phone]  Fax: [phone] Website: [email or web address]

## 2024-03-18 NOTE — Addendum Note (Signed)
 Addended by: Syliva Even on: 03/18/2024 07:01 AM   Modules accepted: Orders

## 2024-03-22 ENCOUNTER — Other Ambulatory Visit

## 2024-03-22 ENCOUNTER — Telehealth: Payer: Self-pay | Admitting: *Deleted

## 2024-03-22 NOTE — Progress Notes (Deleted)
   03/22/2024  Patient ID: Anne [name], female   DOB: 1945-11-19, 77 y.o.   MRN: [id number]  Attempted to contact patient for scheduled appointment for medication management. Busy signal, unable to leave voicemail.  Carnell Christian, PharmD Clinical Pharmacist [phone]

## 2024-03-22 NOTE — Progress Notes (Unsigned)
 Complex Care Management Care Guide Note  03/22/2024 Name: Anne [name] MRN: [id number] DOB: 1946-03-03  Anne [name] is a 78 y.o. year old female who is a primary care patient of Donley Furth, MD and is actively engaged with the care management team. I reached out to Laird Pih by phone today to assist with re-scheduling  with the Pharmacist.  Follow up plan: Unsuccessful telephone outreach attempt made. A HIPAA compliant phone message was left for the patient providing contact information and requesting a return call.  Kandis Ormond, CMA [place]  [facility], [facility] Guide Direct Dial: [phone]  Fax: [phone] Website: [email or web address]

## 2024-03-23 NOTE — Progress Notes (Signed)
 Complex Care Management Care Guide Note  03/23/2024 Name: Anne [name] MRN: [id number] DOB: January 22, 1946  Anne [name] is a 78 y.o. year old female who is a primary care patient of Donley Furth, MD and is actively engaged with the care management team. I reached out to Laird Pih by phone today to assist with re-scheduling  with the Pharmacist.  Follow up plan: Unsuccessful telephone outreach attempt made. A HIPAA compliant phone message was left for the patient providing contact information and requesting a return call.no further outreach attempts will be made due to inability to maintain patient contact.   Kandis Ormond, CMA [place]  [facility], [facility] Guide Direct Dial: [phone]  Fax: [phone] Website: [email or web address]

## 2024-03-24 ENCOUNTER — Ambulatory Visit (INDEPENDENT_AMBULATORY_CARE_PROVIDER_SITE_OTHER): Admitting: Family Medicine

## 2024-03-24 ENCOUNTER — Telehealth: Payer: Self-pay

## 2024-03-24 ENCOUNTER — Ambulatory Visit: Payer: Self-pay | Admitting: *Deleted

## 2024-03-24 ENCOUNTER — Encounter: Payer: Self-pay | Admitting: Family Medicine

## 2024-03-24 VITALS — BP 124/72 | HR 61 | Temp 98.1°F | Wt 164.2 lb

## 2024-03-24 DIAGNOSIS — M545 Low back pain, unspecified: Secondary | ICD-10-CM | POA: Insufficient documentation

## 2024-03-24 DIAGNOSIS — M5441 Lumbago with sciatica, right side: Secondary | ICD-10-CM

## 2024-03-24 DIAGNOSIS — G8929 Other chronic pain: Secondary | ICD-10-CM | POA: Diagnosis not present

## 2024-03-24 NOTE — Progress Notes (Signed)
   Subjective:    Patient ID: Anne [name], female    DOB: 22-Feb-1946, 78 y.o.   MRN: [id number]  HPI Here with questions about her right low back and right leg pain. She saw Dr. Jorja Newport on 03-12-25, and he has set her up to begin PT next week. He also prescribed Prednisone  to take 50 mg daily for 5 days, as well as Gabapentin  to take at bedtime. However she is too nervous to take these so she has been taking Tylenol  only. The pain can be severe at times and it keeps her up at night.    Review of Systems  Constitutional: Negative.   Respiratory: Negative.    Cardiovascular: Negative.        Objective:   Physical Exam Constitutional:      Appearance: Normal appearance.  Cardiovascular:     Rate and Rhythm: Normal rate and regular rhythm.     Pulses: Normal pulses.     Heart sounds: Normal heart sounds.  Pulmonary:     Effort: Pulmonary effort is normal.     Breath sounds: Normal breath sounds.  Neurological:     Mental Status: She is alert.           Assessment & Plan:  Right low back pain with right sided sciatica. I reassured her that taking these medications is safe. I gave her a medication schedule as follows: in the mornings take Tylenol  1000 mg and Prednisone  50 mg. In the midday take Tylenol  1000 mg. At bedtime take Tylenol  1000 mg and Gabapentin  300 mg. She will start PT on Monday.  Anne Diego, MD

## 2024-03-24 NOTE — Telephone Encounter (Signed)
 Copied from CRM [id number]. Topic: General - Other >> Mar 24, 2024  8:04 AM Kita Perish H wrote: Reason for CRM: Patient is calling to speak with pharmacist regarding medication, states they spoke yesterday and she's calling her back.  Deklyn [phone]

## 2024-03-24 NOTE — Telephone Encounter (Signed)
 Patient already spoken with, appt scheduled.

## 2024-03-24 NOTE — Telephone Encounter (Signed)
                         FYI Only or Action Required?: Action required by provider  Patient was last seen in primary care on 03/03/2024 by Donley Furth, MD. Called Nurse Triage reporting Leg Swelling. Symptoms began yesterday. Interventions attempted: OTC medications: tylenol . Symptoms are: gradually worsening.  Triage Disposition: See HCP Within 4 Hours (Or PCP Triage)  Patient/caregiver understands and will follow disposition?: yes         Copied from CRM [id number]. Topic: Clinical - Red Word Triage >> Mar 24, 2024  8:45 AM Annelle Kiel wrote: Red Word that prompted transfer to Nurse Triage: right leg pain and its swollen Reason for Disposition  [1] Thigh, calf, or ankle swelling AND [2] bilateral AND [3] 1 side is more swollen  Answer Assessment - Initial Assessment Questions 1. ONSET: When did the swelling start? (e.g., minutes, hours, days)     Yesterday  2. LOCATION: What part of the leg is swollen?  Are both legs swollen or just one leg?     Both legs but right leg more with pain 3. SEVERITY: How bad is the swelling? (e.g., localized; mild, moderate, severe)   - Localized: Small area of swelling localized to one leg.   - MILD pedal edema: Swelling limited to foot and ankle, pitting edema < 1/4 inch (6 mm) deep, rest and elevation eliminate most or all swelling.   - MODERATE edema: Swelling of lower leg to knee, pitting edema > 1/4 inch (6 mm) deep, rest and elevation only partially reduce swelling.   - SEVERE edema: Swelling extends above knee, facial or hand swelling present.      Swelling leaves indention when touched, and swelling up to knees  4. REDNESS: Does the swelling look red or infected?     No  5. PAIN: Is the swelling painful to touch? If Yes, ask: How painful is it?   (Scale 1-10; mild, moderate or severe)     Pain with standing 6/10. Took 2 tylenol  6. FEVER: Do you have a fever? If Yes, ask: What is it, how was it measured,  and when did it start?      na 7. CAUSE: What do you think is causing the leg swelling?     Not sure  8. MEDICAL HISTORY: Do you have a history of blood clots (e.g., DVT), cancer, heart failure, kidney disease, or liver failure?     3 weeks ago DVT  9. RECURRENT SYMPTOM: Have you had leg swelling before? If Yes, ask: When was the last time? What happened that time?     Yes blood clot diagnosed and taking eliquis  x few weeks. 10. OTHER SYMPTOMS: Do you have any other symptoms? (e.g., chest pain, difficulty breathing)       No , right leg shallow breathing at times. Did report temp 99.7 this am . Took tylenol  for pain .  11. PREGNANCY: Is there any chance you are pregnant? When was your last menstrual period?       Na   Right leg swelling worse last night than this am. Pain with walking and right knee pain. Reports taking eliquis  s/p DVT. Took gabapentin  for pain but has not started prednisone  per Dr Alease Hunter. Tylenol  has helped with pain. Pitting edema present . Swelling up to knee.  Appt scheduled today .  Protocols used: Leg Swelling and Edema-A-AH

## 2024-03-24 NOTE — Telephone Encounter (Signed)
 Pt seen this afternoon by Dr Alyne Babinski for this problem

## 2024-03-24 NOTE — Progress Notes (Signed)
   03/24/2024  Patient ID: Anne [name], female   DOB: 1946-08-16, 78 y.o.   MRN: [id number]  Patient returned my missed call, requesting to reschedule her appointment to discuss med cost concerns.  Patient agrees to phone call appt on 03/29/24 at 2:30pm.  Carnell Christian, PharmD Clinical Pharmacist [phone]

## 2024-03-29 ENCOUNTER — Encounter: Payer: Self-pay | Admitting: Physical Therapy

## 2024-03-29 ENCOUNTER — Ambulatory Visit: Admitting: Physical Therapy

## 2024-03-29 ENCOUNTER — Other Ambulatory Visit: Payer: Self-pay

## 2024-03-29 ENCOUNTER — Other Ambulatory Visit

## 2024-03-29 DIAGNOSIS — M79604 Pain in right leg: Secondary | ICD-10-CM | POA: Diagnosis not present

## 2024-03-29 DIAGNOSIS — M6281 Muscle weakness (generalized): Secondary | ICD-10-CM | POA: Diagnosis not present

## 2024-03-29 DIAGNOSIS — M5459 Other low back pain: Secondary | ICD-10-CM

## 2024-03-29 NOTE — Therapy (Signed)
 OUTPATIENT PHYSICAL THERAPY EVALUATION   Patient Name: Anne [name] MRN: [id number] DOB:09-15-1946, 78 y.o., female Today's Date: 03/30/2024   END OF SESSION:  PT End of Session - 03/29/24 1203     Visit Number 1    Number of Visits 9    Date for PT Re-Evaluation 05/24/24    Authorization Type [employer] MCR    PT Start Time 1146    PT Stop Time 1230    PT Time Calculation (min) 44 min    Activity Tolerance Patient tolerated treatment well    Behavior During Therapy WFL for tasks assessed/performed          Past Medical History:  Diagnosis Date   GERD (gastroesophageal reflux disease) 08/29/2021   Hyperlipidemia    Hypertension    Osteopenia    per DEXA on 07-19-09   Stroke (HCC) 09/14/2007   has mild left leg weakness, saw Dr. Albertina Hugger   History reviewed. No pertinent surgical history. Patient Active Problem List   Diagnosis Date Noted   Low back pain 03/24/2024   Acute pulmonary embolism (HCC) 02/12/2024   Hypokalemia 02/12/2024   Vertigo 12/01/2023   Bite, insect 04/30/2023   COVID-19 virus infection 05/27/2022   GERD (gastroesophageal reflux disease) 08/29/2021   Bradycardia, unspecified 01/02/2018   Mallet deformity of fourth finger, right 02/02/2014   Allergic rhinitis 05/08/2010   DIZZINESS 02/01/2010   Lipoma 08/11/2009   Disorder of bone and cartilage 08/11/2009   SUPRAVENTRICULAR PREMATURE BEATS 07/28/2008   FATIGUE 06/16/2008   MYOSITIS 05/12/2008   Hyperlipidemia 11/24/2007   Essential hypertension 11/24/2007    PCP: Donley Furth, MD  REFERRING PROVIDER: Syliva Even, MD  REFERRING DIAG: Lumbar radiculopathy  Rationale for Evaluation and Treatment: Rehabilitation  THERAPY DIAG:  Pain in right leg  Other low back pain  Muscle weakness (generalized)  ONSET DATE: 12/26/2023   SUBJECTIVE:         SUBJECTIVE STATEMENT: Patient reports about 3 months ago she opened her car door and in an effort not to hit herself she stepped back  oddly on the right leg and spun around. From that point on her right leg has been bothering her. Pain can travel from the right lower back all the way down to the calf and back up. States it is hard for her to describe her pain, it is not tender but still has pain that can be excruciating. Pain has been severely limiting her activity level. She has begun taking her medication more regularly the past few days and her pain has improved since that time.  PERTINENT HISTORY:  See PMH above  PAIN:  Are you having pain? Yes:  NPRS scale: 4/10 currently, pain at worse 7/10 Pain location: Right leg Pain description: Ache Aggravating factors: Standing, walking, moving around Relieving factors: Medication, ice, rest  PRECAUTIONS: None  RED FLAGS: None   WEIGHT BEARING RESTRICTIONS: No  FALLS:  Has patient fallen in last 6 months? No  PLOF: Independent  PATIENT GOALS: Pain relief   OBJECTIVE:  Note: Objective measures were completed at Evaluation unless otherwise noted. PATIENT SURVEYS:  PSFS: 3.66 Walking: 5 Moving around with speed: 3 Standing for a while: 3  COGNITION: Overall cognitive status: Within functional limits for tasks assessed     SENSATION: WFL  MUSCLE LENGTH: Hamstring grossly WFL  POSTURE:   Rounded shoulder posture  PALPATION: Non-tender to palpation  Lumbar CPAs grossly WFL and non-painful  LUMBAR ROM:   AROM eval  Flexion  WFL  Extension 75%  Right lateral flexion WFL  Left lateral flexion WFL  Right rotation WFL  Left rotation WFL   (Blank rows = not tested)  LOWER EXTREMITY ROM:      Hip PROM grossly WFL and non-painful  LOWER EXTREMITY MMT:    MMT Right eval Left eval  Hip flexion 4- 4-  Hip extension 3 3+  Hip abduction 3- 3  Hip adduction    Hip internal rotation    Hip external rotation    Knee flexion 5 5  Knee extension 5 5  Ankle dorsiflexion    Ankle plantarflexion    Ankle inversion    Ankle eversion     (Blank rows  = not tested)  LUMBAR SPECIAL TESTS:  Slump and SLR negative for radicular symptoms  FUNCTIONAL TESTS:  5 times sit to stand: 16 seconds  GAIT: Assistive device utilized: None Level of assistance: Complete Independence Comments: Shuffling gait pattern   TREATMENT [facility] Adult PT Treatment:                                                DATE: 03/29/2024 Side clamshell with red x 10 each Bridge 10 x 5 sec SLR x 10 each  PATIENT EDUCATION:  Education details: Exam findings, POC, HEP Person educated: Patient Education method: Explanation, Demonstration, Tactile cues, Verbal cues, and Handouts Education comprehension: verbalized understanding, returned demonstration, verbal cues required, tactile cues required, and needs further education  HOME EXERCISE PROGRAM: Access Code: [email or web address]   ASSESSMENT: CLINICAL IMPRESSION: Patient is a 78 y.o. female who was seen today for physical therapy evaluation and treatment for chronic right leg pain that does seem to be related to the lumbar spine. She has mild limitations with lumbar spine mobility and gross hip and core strength deficits, but was not reporting much pain or reproduction of her right leg symptoms with testing. She reported majority of pain this visit was located to the right gluteal region but unable to reproduce any pain with palpation of the lumbar spine or right hip/leg. Exercises provided to address strength deficits.  OBJECTIVE IMPAIRMENTS: Abnormal gait, decreased activity tolerance, decreased balance, decreased ROM, decreased strength, and pain.   ACTIVITY LIMITATIONS: carrying, lifting, standing, and locomotion level  PARTICIPATION LIMITATIONS: meal prep, cleaning, shopping, and community activity  PERSONAL FACTORS: Fitness, Past/current experiences, and Time since onset of injury/illness/exacerbation are also affecting patient's functional outcome.   REHAB POTENTIAL: Good  CLINICAL DECISION MAKING:  Stable/uncomplicated  EVALUATION COMPLEXITY: Low   GOALS: Goals reviewed with patient? Yes  SHORT TERM GOALS: Target date: 04/26/2024  Patient will be I with initial HEP in order to progress with therapy. Baseline: HEP provided at eval Goal status: INITIAL  2.  Patient will report right leg pain at worst with activity </= 5/10 in order to reduce functional limitations Baseline: 7/10 Goal status: INITIAL  LONG TERM GOALS: Target date: 05/24/2024  Patient will be I with final HEP to maintain progress from PT. Baseline: HEP provided at eval Goal status: INITIAL  2.  Patient will report PSFS >/= 6 in order to indicate improvement in her functional status Baseline: 3.66 Goal status: INITIAL  3.  Patient will demonstrate hip strength >/= 4/5 MMT in order to improve her walking and activity tolerance Baseline: see limitations above Goal status: INITIAL  4.  Patient will perform 5xSTS </=  12 seconds in order to indicate improvement in strength, mobility, and reduced fall risk Baseline: 16 seconds Goal status: INITIAL   PLAN: PT FREQUENCY: 1x/week  PT DURATION: 1 week  PLANNED INTERVENTIONS: 97164- PT Re-evaluation, 97750- Physical Performance Testing, 97110-Therapeutic exercises, 97530- Therapeutic activity, 97112- Neuromuscular re-education, 97535- Self Care, 16109- Manual therapy, 97116- Gait training, [phone] (1-2 muscles), 20561 (3+ muscles)- Dry Needling, Patient/Family education, Balance training, Stair training, Joint mobilization, Joint manipulation, Spinal manipulation, Spinal mobilization, Cryotherapy, and Moist heat.  PLAN FOR NEXT SESSION: Review HEP and progress PRN, continue progression for core, hip, and LE strengthening, gait training, balance training    Leah Primus, PT, DPT, LAT, ATC 03/30/24  8:28 AM Phone: [phone] Fax: [phone]

## 2024-03-29 NOTE — Progress Notes (Signed)
   03/29/2024  Patient ID: Anne [name], female   DOB: November 29, 1945, 78 y.o.   MRN: [id number]  Reached out to patient via telephone at referral of PCP to discuss Eliquis  cost concerns.  Patient reports she just picked up a three month supply last week for $90/90DS.  Says she managed but is wondering if any assistance available.  Patient is unsure if she has met the 3% OOP spending expense for rx for 2025. Will prepare application and patient will ask pharmacy for expense report and return to office for further processing and review. Patient will continue to get from pharmacy in the meantime.  Carnell Christian, PharmD Clinical Pharmacist [phone]

## 2024-03-29 NOTE — Patient Instructions (Signed)
 Access Code: [email or web address] URL: [email or web address] Date: 03/29/2024 Prepared by: Leah Primus  Exercises - Clam with Resistance  - 1 x daily - 2 sets - 10 reps - Bridge  - 1 x daily - 2 sets - 10 reps - 5 seconds hold - Active Straight Leg Raise with Quad Set  - 1 x daily - 2 sets - 10 reps

## 2024-03-30 ENCOUNTER — Telehealth: Payer: Self-pay

## 2024-03-30 NOTE — Telephone Encounter (Signed)
 Hey, when you have a chance could you please submit this [employer] PA? Date range 03/29/2024 - 05/24/2024 for 8 visits should be good. Please let me know if you need any clinical information. Thank you!

## 2024-03-31 NOTE — Telephone Encounter (Signed)
 Authorization #[id number] 03/29/2024 - 05/24/2024 8 visits and 1 re evaluation No pre cert required for 40981 and 20561 Will scan the approval letter in media

## 2024-04-21 ENCOUNTER — Encounter: Payer: Self-pay | Admitting: Physical Therapy

## 2024-04-21 ENCOUNTER — Other Ambulatory Visit: Payer: Self-pay

## 2024-04-21 ENCOUNTER — Ambulatory Visit: Admitting: Physical Therapy

## 2024-04-21 DIAGNOSIS — M5459 Other low back pain: Secondary | ICD-10-CM

## 2024-04-21 DIAGNOSIS — M79604 Pain in right leg: Secondary | ICD-10-CM

## 2024-04-21 DIAGNOSIS — M6281 Muscle weakness (generalized): Secondary | ICD-10-CM | POA: Diagnosis not present

## 2024-04-21 NOTE — Patient Instructions (Signed)
 Access Code: [email or web address] URL: [email or web address] Date: 04/21/2024 Prepared by: Elaine Daring  Exercises - Clam with Resistance  - 1 x daily - 2 sets - 10 reps - Bridge  - 1 x daily - 2 sets - 10 reps - 5 seconds hold - Active Straight Leg Raise with Quad Set  - 1 x daily - 2 sets - 10 reps - Sit to Stand Without Arm Support  - 1 x daily - 3 sets - 10 reps

## 2024-04-21 NOTE — Therapy (Signed)
 OUTPATIENT PHYSICAL THERAPY TREATMENT   Patient Name: Anne [name] MRN: [id number] DOB:[birth year], 78 y.o., female Today's Date: 04/21/2024   END OF SESSION:  PT End of Session - 04/21/24 1132     Visit Number 2    Number of Visits 9    Date for PT Re-Evaluation 05/24/24    Authorization Type [employer] MCR    Authorization Time Period 03/29/2024 - 05/24/2024    Authorization - Visit Number 2    Authorization - Number of Visits 8    PT Start Time 1146    PT Stop Time 1230    PT Time Calculation (min) 44 min    Activity Tolerance Patient tolerated treatment well    Behavior During Therapy [facility] for tasks assessed/performed           Past Medical History:  Diagnosis Date   GERD (gastroesophageal reflux disease) 08/29/2021   Hyperlipidemia    Hypertension    Osteopenia    per DEXA on 07-19-09   Stroke (HCC) 09/14/2007   has mild left leg weakness, saw Dr. Chalice   History reviewed. No pertinent surgical history. Patient Active Problem List   Diagnosis Date Noted   Low back pain 03/24/2024   Acute pulmonary embolism (HCC) 02/12/2024   Hypokalemia 02/12/2024   Vertigo 12/01/2023   Bite, insect 04/30/2023   COVID-19 virus infection 05/27/2022   GERD (gastroesophageal reflux disease) 08/29/2021   Bradycardia, unspecified 01/02/2018   Mallet deformity of fourth finger, right 02/02/2014   Allergic rhinitis 05/08/2010   DIZZINESS 02/01/2010   Lipoma 08/11/2009   Disorder of bone and cartilage 08/11/2009   SUPRAVENTRICULAR PREMATURE BEATS 07/28/2008   FATIGUE 06/16/2008   MYOSITIS 05/12/2008   Hyperlipidemia 11/24/2007   Essential hypertension 11/24/2007    PCP: Johnny Garnette LABOR, MD  REFERRING PROVIDER: Joane Artist RAMAN, MD  REFERRING DIAG: Lumbar radiculopathy  Rationale for Evaluation and Treatment: Rehabilitation  THERAPY DIAG:  Pain in right leg  Other low back pain  Muscle weakness (generalized)  ONSET DATE: 12/26/2023   SUBJECTIVE:         SUBJECTIVE  STATEMENT: Patient reports she tried some different shoes about 4 days ago and since then she has been having more pain that makes it difficult to walk.   Eval: Patient reports about 3 months ago she opened her car door and in an effort not to hit herself she stepped back oddly on the right leg and spun around. From that point on her right leg has been bothering her. Pain can travel from the right lower back all the way down to the calf and back up. States it is hard for her to describe her pain, it is not tender but still has pain that can be excruciating. Pain has been severely limiting her activity level. She has begun taking her medication more regularly the past few days and her pain has improved since that time.  PERTINENT HISTORY:  See PMH above  PAIN:  Are you having pain? Yes:  NPRS scale: 4/10 currently, pain at worse 7/10 Pain location: Right lower back/hip and leg Pain description: Ache Aggravating factors: Standing, walking, moving around Relieving factors: Medication, ice, rest  PRECAUTIONS: None  PATIENT GOALS: Pain relief   OBJECTIVE:  Note: Objective measures were completed at Evaluation unless otherwise noted. PATIENT SURVEYS:  PSFS: 3.66 Walking: 5 Moving around with speed: 3 Standing for a while: 3   SENSATION: WFL  MUSCLE LENGTH: Hamstring grossly WFL  POSTURE:   Rounded shoulder  posture  PALPATION: Non-tender to palpation  Lumbar CPAs grossly WFL and non-painful  LUMBAR ROM:   AROM eval  Flexion WFL  Extension 75%  Right lateral flexion WFL  Left lateral flexion WFL  Right rotation WFL  Left rotation WFL   (Blank rows = not tested)  LOWER EXTREMITY ROM:      Hip PROM grossly WFL and non-painful  LOWER EXTREMITY MMT:    MMT Right eval Left eval Rt / Lt 04/21/2024  Hip flexion 4- 4-   Hip extension 3 3+   Hip abduction 3- 3 3- / 3  Hip adduction     Hip internal rotation     Hip external rotation     Knee flexion 5 5   Knee  extension 5 5   Ankle dorsiflexion     Ankle plantarflexion     Ankle inversion     Ankle eversion      (Blank rows = not tested)  LUMBAR SPECIAL TESTS:  Slump and SLR negative for radicular symptoms  FUNCTIONAL TESTS:  5 times sit to stand: 16 seconds  GAIT: Assistive device utilized: None Level of assistance: Complete Independence Comments: Shuffling gait pattern   TREATMENT [facility] Adult PT Treatment:                                                DATE: 04/21/2024 Recumbent bike L3 x 5 min to improve endurance and workload capacity LAD for Rt LE x 5 bouts Hooklying PPT 2 x 10 x 5 sec Bridge 2 x 10 x 5 sec SLR with abdominal engagement 2 x 10 each Side clamshell with green 3 x 10 each Sit to stand 2 x 10  PATIENT EDUCATION:  Education details: HEP update Person educated: Patient Education method: Explanation, Demonstration, Tactile cues, Verbal cues, and Handouts Education comprehension: verbalized understanding, returned demonstration, verbal cues required, tactile cues required, and needs further education  HOME EXERCISE PROGRAM: Access Code: [email or web address]   ASSESSMENT: CLINICAL IMPRESSION: Patient tolerated therapy well with no adverse effects. Therapy focused primarily on core stabilization and hip strengthening with good tolerance. She does continue to exhibit gross hip strength deficits. She did respond well to the LAD for the the right leg reporting relaxation of the right hip and lower back region. She was able to complete all exercises without any report of pain and states she feels like the exercises are helping. Updated HEP to progress strengthening for home. Patient would benefit from continued skilled PT to progress mobility and strength in order to reduce pain and maximize functional ability.   Eval: Patient is a 78 y.o. female who was seen today for physical therapy evaluation and treatment for chronic right leg pain that does seem to be related to the lumbar spine.  She has mild limitations with lumbar spine mobility and gross hip and core strength deficits, but was not reporting much pain or reproduction of her right leg symptoms with testing. She reported majority of pain this visit was located to the right gluteal region but unable to reproduce any pain with palpation of the lumbar spine or right hip/leg. Exercises provided to address strength deficits.  OBJECTIVE IMPAIRMENTS: Abnormal gait, decreased activity tolerance, decreased balance, decreased ROM, decreased strength, and pain.   ACTIVITY LIMITATIONS: carrying, lifting, standing, and locomotion level  PARTICIPATION LIMITATIONS: meal prep, cleaning, shopping, and community  activity  PERSONAL FACTORS: Fitness, Past/current experiences, and Time since onset of injury/illness/exacerbation are also affecting patient's functional outcome.    GOALS: Goals reviewed with patient? Yes  SHORT TERM GOALS: Target date: 04/26/2024  Patient will be I with initial HEP in order to progress with therapy. Baseline: HEP provided at eval Goal status: INITIAL  2.  Patient will report right leg pain at worst with activity </= 5/10 in order to reduce functional limitations Baseline: 7/10 Goal status: INITIAL  LONG TERM GOALS: Target date: 05/24/2024  Patient will be I with final HEP to maintain progress from PT. Baseline: HEP provided at eval Goal status: INITIAL  2.  Patient will report PSFS >/= 6 in order to indicate improvement in her functional status Baseline: 3.66 Goal status: INITIAL  3.  Patient will demonstrate hip strength >/= 4/5 MMT in order to improve her walking and activity tolerance Baseline: see limitations above Goal status: INITIAL  4.  Patient will perform 5xSTS </= 12 seconds in order to indicate improvement in strength, mobility, and reduced fall risk Baseline: 16 seconds Goal status: INITIAL   PLAN: PT FREQUENCY: 1x/week  PT DURATION: 1 week  PLANNED INTERVENTIONS: 97164- PT  Re-evaluation, 97750- Physical Performance Testing, 97110-Therapeutic exercises, 97530- Therapeutic activity, 97112- Neuromuscular re-education, 97535- Self Care, 02859- Manual therapy, [id number]- Gait training, [phone] (1-2 muscles), 20561 (3+ muscles)- Dry Needling, Patient/Family education, Balance training, Stair training, Joint mobilization, Joint manipulation, Spinal manipulation, Spinal mobilization, Cryotherapy, and Moist heat.  PLAN FOR NEXT SESSION: Review HEP and progress PRN, continue progression for core, hip, and LE strengthening, gait training, balance training    Elaine Daring, PT, DPT, LAT, ATC 04/21/24  12:37 PM Phone: [phone] Fax: [phone]

## 2024-04-27 ENCOUNTER — Telehealth: Payer: Self-pay

## 2024-04-27 NOTE — Telephone Encounter (Signed)
 Called pt advised of how to take the Gabapentin  medication, voiced understanding Copied from CRM [id number]. Topic: General - Other >> Apr 26, 2024 10:26 AM Charolett L wrote: Reason for CRM: patient is requesting for the nurse to give her a call back

## 2024-04-28 ENCOUNTER — Encounter: Payer: Self-pay | Admitting: Family Medicine

## 2024-04-28 ENCOUNTER — Other Ambulatory Visit: Payer: Self-pay

## 2024-04-28 ENCOUNTER — Encounter: Payer: Self-pay | Admitting: Physical Therapy

## 2024-04-28 ENCOUNTER — Ambulatory Visit: Admitting: Physical Therapy

## 2024-04-28 ENCOUNTER — Ambulatory Visit: Admitting: Family Medicine

## 2024-04-28 VITALS — BP 122/84 | HR 80 | Ht 65.0 in | Wt 163.0 lb

## 2024-04-28 DIAGNOSIS — M79604 Pain in right leg: Secondary | ICD-10-CM | POA: Diagnosis not present

## 2024-04-28 DIAGNOSIS — M6281 Muscle weakness (generalized): Secondary | ICD-10-CM | POA: Diagnosis not present

## 2024-04-28 DIAGNOSIS — M5416 Radiculopathy, lumbar region: Secondary | ICD-10-CM

## 2024-04-28 DIAGNOSIS — M5459 Other low back pain: Secondary | ICD-10-CM

## 2024-04-28 NOTE — Patient Instructions (Addendum)
 Thank you for coming in today.   Stop Gabapentin   Take Tylenol  as needed   Continue Physical Therapy  See you back as needed.

## 2024-04-28 NOTE — Therapy (Signed)
 OUTPATIENT PHYSICAL THERAPY TREATMENT   Patient Name: Anne [name] MRN: [id number] DOB:Feb 11, 1946, 78 y.o., female Today's Date: 04/28/2024   END OF SESSION:  PT End of Session - 04/28/24 1153     Visit Number 3    Number of Visits 9    Date for PT Re-Evaluation 05/24/24    Authorization Type [employer] MCR    Authorization Time Period 03/29/2024 - 05/24/2024    Authorization - Visit Number 3    Authorization - Number of Visits 8    PT Start Time 1145    PT Stop Time 1225    PT Time Calculation (min) 40 min    Activity Tolerance Patient tolerated treatment well    Behavior During Therapy WFL for tasks assessed/performed            Past Medical History:  Diagnosis Date   GERD (gastroesophageal reflux disease) 08/29/2021   Hyperlipidemia    Hypertension    Osteopenia    per DEXA on 07-19-09   Stroke (HCC) 09/14/2007   has mild left leg weakness, saw Dr. Chalice   History reviewed. No pertinent surgical history. Patient Active Problem List   Diagnosis Date Noted   Low back pain 03/24/2024   Acute pulmonary embolism (HCC) 02/12/2024   Hypokalemia 02/12/2024   Vertigo 12/01/2023   Bite, insect 04/30/2023   COVID-19 virus infection 05/27/2022   GERD (gastroesophageal reflux disease) 08/29/2021   Bradycardia, unspecified 01/02/2018   Mallet deformity of fourth finger, right 02/02/2014   Allergic rhinitis 05/08/2010   DIZZINESS 02/01/2010   Lipoma 08/11/2009   Disorder of bone and cartilage 08/11/2009   SUPRAVENTRICULAR PREMATURE BEATS 07/28/2008   FATIGUE 06/16/2008   MYOSITIS 05/12/2008   Hyperlipidemia 11/24/2007   Essential hypertension 11/24/2007    PCP: Johnny Garnette LABOR, MD  REFERRING PROVIDER: Joane Artist RAMAN, MD  REFERRING DIAG: Lumbar radiculopathy  Rationale for Evaluation and Treatment: Rehabilitation  THERAPY DIAG:  Pain in right leg  Other low back pain  Muscle weakness (generalized)  ONSET DATE: 12/26/2023   SUBJECTIVE:          SUBJECTIVE STATEMENT: Patient reports she is feeling better. He exercises are going well but she feels like she needs to be more consistent. She doesn't feel as sluggish and her walking is better. She did wake up with some right lower back and calf pain but could have been how she was sleeping. She does report right ankle edema that has been going down.  Eval: Patient reports about 3 months ago she opened her car door and in an effort not to hit herself she stepped back oddly on the right leg and spun around. From that point on her right leg has been bothering her. Pain can travel from the right lower back all the way down to the calf and back up. States it is hard for her to describe her pain, it is not tender but still has pain that can be excruciating. Pain has been severely limiting her activity level. She has begun taking her medication more regularly the past few days and her pain has improved since that time.  PERTINENT HISTORY:  See PMH above  PAIN:  Are you having pain? Yes:  NPRS scale: 4/10 currently, pain at worse 6/10 Pain location: Right lower back and calf Pain description: Ache Aggravating factors: Standing, walking, moving around Relieving factors: Medication, ice, rest  PRECAUTIONS: None  PATIENT GOALS: Pain relief   OBJECTIVE:  Note: Objective measures were completed at Evaluation  unless otherwise noted. PATIENT SURVEYS:  PSFS: 3.66 Walking: 5 Moving around with speed: 3 Standing for a while: 3   SENSATION: WFL  MUSCLE LENGTH: Hamstring grossly WFL  POSTURE:   Rounded shoulder posture  PALPATION: Non-tender to palpation  Lumbar CPAs grossly WFL and non-painful  LUMBAR ROM:   AROM eval  Flexion WFL  Extension 75%  Right lateral flexion WFL  Left lateral flexion WFL  Right rotation WFL  Left rotation WFL   (Blank rows = not tested)  LOWER EXTREMITY ROM:      Hip PROM grossly WFL and non-painful  LOWER EXTREMITY MMT:    MMT Right eval  Left eval Rt / Lt 04/21/2024 Rt / Lt 04/28/2024  Hip flexion 4- 4-    Hip extension 3 3+    Hip abduction 3- 3 3- / 3 3 / 3+  Hip adduction      Hip internal rotation      Hip external rotation      Knee flexion 5 5    Knee extension 5 5    Ankle dorsiflexion      Ankle plantarflexion      Ankle inversion      Ankle eversion       (Blank rows = not tested)  LUMBAR SPECIAL TESTS:  Slump and SLR negative for radicular symptoms  FUNCTIONAL TESTS:  5 times sit to stand: 16 seconds  04/28/2024: 14 seconds  GAIT: Assistive device utilized: None Level of assistance: Complete Independence Comments: Shuffling gait pattern   TREATMENT [facility] Adult PT Treatment:                                                DATE: 04/28/2024 Recumbent bike L3 x 5 min to improve endurance and workload capacity Bridge 2 x 10 x 5 sec SLR with abdominal engagement 2 x 10 each Hooklying clamshell with blue 2 x 10 Hooklying alternating march with blue 2 x 20 Sidelying hip abduction 2 x 10 each Sit to stand 2 x 10  Discussed methods to manage right ankle edema. Increasing activity level with walking and household activity.  PATIENT EDUCATION:  Education details: HEP Person educated: Patient Education method: Solicitor, Actor cues, Verbal cues Education comprehension: verbalized understanding, returned demonstration, verbal cues required, tactile cues required, and needs further education  HOME EXERCISE PROGRAM: Access Code: [email or web address]   ASSESSMENT: CLINICAL IMPRESSION: Patient tolerated therapy well with no adverse effects. Therapy focused on progressing hip core, hip, and LE strengthening with good tolerance. She does exhibit slight improvement in hip strength and her 5xSTS, but still with deficits, and she reports that the exercises are helping and she does feel stronger and her walking has improved. No changes made to her HEP this visit. Patient would benefit from continued skilled  PT to progress mobility and strength in order to reduce pain and maximize functional ability.   Eval: Patient is a 78 y.o. female who was seen today for physical therapy evaluation and treatment for chronic right leg pain that does seem to be related to the lumbar spine. She has mild limitations with lumbar spine mobility and gross hip and core strength deficits, but was not reporting much pain or reproduction of her right leg symptoms with testing. She reported majority of pain this visit was located to the right gluteal region but unable to  reproduce any pain with palpation of the lumbar spine or right hip/leg. Exercises provided to address strength deficits.  OBJECTIVE IMPAIRMENTS: Abnormal gait, decreased activity tolerance, decreased balance, decreased ROM, decreased strength, and pain.   ACTIVITY LIMITATIONS: carrying, lifting, standing, and locomotion level  PARTICIPATION LIMITATIONS: meal prep, cleaning, shopping, and community activity  PERSONAL FACTORS: Fitness, Past/current experiences, and Time since onset of injury/illness/exacerbation are also affecting patient's functional outcome.    GOALS: Goals reviewed with patient? Yes  SHORT TERM GOALS: Target date: 04/26/2024  Patient will be I with initial HEP in order to progress with therapy. Baseline: HEP provided at eval 04/28/2024: independent with initial HEP Goal status: MET  2.  Patient will report right leg pain at worst with activity </= 5/10 in order to reduce functional limitations Baseline: 7/10 04/28/2024: 6/10 Goal status: ONGOING  LONG TERM GOALS: Target date: 05/24/2024  Patient will be I with final HEP to maintain progress from PT. Baseline: HEP provided at eval Goal status: INITIAL  2.  Patient will report PSFS >/= 6 in order to indicate improvement in her functional status Baseline: 3.66 Goal status: INITIAL  3.  Patient will demonstrate hip strength >/= 4/5 MMT in order to improve her walking and activity  tolerance Baseline: see limitations above Goal status: INITIAL  4.  Patient will perform 5xSTS </= 12 seconds in order to indicate improvement in strength, mobility, and reduced fall risk Baseline: 16 seconds 04/28/2024: 14 seconds Goal status: INITIAL   PLAN: PT FREQUENCY: 1x/week  PT DURATION: 1 week  PLANNED INTERVENTIONS: 97164- PT Re-evaluation, 97750- Physical Performance Testing, 97110-Therapeutic exercises, 97530- Therapeutic activity, 97112- Neuromuscular re-education, 97535- Self Care, 02859- Manual therapy, 97116- Gait training, [phone] (1-2 muscles), 20561 (3+ muscles)- Dry Needling, Patient/Family education, Balance training, Stair training, Joint mobilization, Joint manipulation, Spinal manipulation, Spinal mobilization, Cryotherapy, and Moist heat.  PLAN FOR NEXT SESSION: Review HEP and progress PRN, continue progression for core, hip, and LE strengthening, gait training, balance training    Elaine Daring, PT, DPT, LAT, ATC 04/28/24  12:33 PM Phone: [phone] Fax: [phone]

## 2024-04-28 NOTE — Progress Notes (Signed)
   I, Leotis Batter, CMA acting as a scribe for Artist Lloyd, MD.  Anne [name] is a 78 y.o. female who presents to [facility] today for f/u R buttock/leg pain. Pt was last seen by Dr. Lloyd on 03/12/24 and was referred to PT, completing 2 visits.  Today, pt reports continued stiffness in the lower back and hip. Feels like PT has been helpful for sx, especially for pain. Taking Tylenol  up to TID prn. Also taking Gabapentin  at bedtime. Today having some pain radiating into the posterior leg to the calf, intermittent. HEP provided by PT has been helpful for sx.   Dx imaging: 03/03/24 R hip XR   Pertinent review of systems: No fevers or chills  Relevant historical information: History of a pulmonary embolism.  Patient currently treated with Eliquis .   Exam:  BP 122/84   Pulse 80   Ht 5' 5 (1.651 m)   Wt 163 lb (73.9 kg)   SpO2 98%   BMI 27.12 kg/m  General: Well Developed, well nourished, and in no acute distress.   MSK: L-spine decreased lumbar motion normal gait.       Assessment and Plan: 78 y.o. female with chronic back pain and lumbar radiculopathy improving with physical therapy.  Plan to continue PT and home exercise program and check back with me as needed.  We did talk about her medications.  She can switch Tylenol  to just as needed at this point and can discontinue gabapentin  and less needed.   PDMP not reviewed this encounter. No orders of the defined types were placed in this encounter.  No orders of the defined types were placed in this encounter.    Discussed warning signs or symptoms. Please see discharge instructions. Patient expresses understanding.   The above documentation has been reviewed and is accurate and complete Artist Lloyd, M.D.

## 2024-04-30 ENCOUNTER — Other Ambulatory Visit: Payer: Self-pay | Admitting: Family Medicine

## 2024-05-04 ENCOUNTER — Ambulatory Visit: Admitting: Physical Therapy

## 2024-05-04 ENCOUNTER — Encounter: Payer: Self-pay | Admitting: Physical Therapy

## 2024-05-04 ENCOUNTER — Other Ambulatory Visit: Payer: Self-pay

## 2024-05-04 DIAGNOSIS — M5459 Other low back pain: Secondary | ICD-10-CM

## 2024-05-04 DIAGNOSIS — M6281 Muscle weakness (generalized): Secondary | ICD-10-CM | POA: Diagnosis not present

## 2024-05-04 DIAGNOSIS — M79604 Pain in right leg: Secondary | ICD-10-CM | POA: Diagnosis not present

## 2024-05-04 NOTE — Therapy (Signed)
 OUTPATIENT PHYSICAL THERAPY TREATMENT   Patient Name: Anne [name] MRN: [id number] DOB:April 27, 1946, 78 y.o., female Today's Date: 05/04/2024   END OF SESSION:  PT End of Session - 05/04/24 0850     Visit Number 4    Number of Visits 9    Date for PT Re-Evaluation 05/24/24    Authorization Type [employer] MCR    Authorization Time Period 03/29/2024 - 05/24/2024    Authorization - Visit Number 4    Authorization - Number of Visits 8    PT Start Time 0846    PT Stop Time 0927    PT Time Calculation (min) 41 min    Activity Tolerance Patient tolerated treatment well    Behavior During Therapy [facility] for tasks assessed/performed             Past Medical History:  Diagnosis Date   GERD (gastroesophageal reflux disease) 08/29/2021   Hyperlipidemia    Hypertension    Osteopenia    per DEXA on 07-19-09   Stroke (HCC) 09/14/2007   has mild left leg weakness, saw Dr. Chalice   History reviewed. No pertinent surgical history. Patient Active Problem List   Diagnosis Date Noted   Low back pain 03/24/2024   Acute pulmonary embolism (HCC) 02/12/2024   Hypokalemia 02/12/2024   Vertigo 12/01/2023   Bite, insect 04/30/2023   COVID-19 virus infection 05/27/2022   GERD (gastroesophageal reflux disease) 08/29/2021   Bradycardia, unspecified 01/02/2018   Mallet deformity of fourth finger, right 02/02/2014   Allergic rhinitis 05/08/2010   DIZZINESS 02/01/2010   Lipoma 08/11/2009   Disorder of bone and cartilage 08/11/2009   SUPRAVENTRICULAR PREMATURE BEATS 07/28/2008   FATIGUE 06/16/2008   MYOSITIS 05/12/2008   Hyperlipidemia 11/24/2007   Essential hypertension 11/24/2007    PCP: Johnny Garnette LABOR, MD  REFERRING PROVIDER: Joane Artist RAMAN, MD  REFERRING DIAG: Lumbar radiculopathy  Rationale for Evaluation and Treatment: Rehabilitation  THERAPY DIAG:  Pain in right leg  Other low back pain  Muscle weakness (generalized)  ONSET DATE: 12/26/2023   SUBJECTIVE:          SUBJECTIVE STATEMENT: Patient reports she is still feeling better. Has been working on her exercises and feel they are helping. She did do some walking on the street and driveway this weekend.   Eval: Patient reports about 3 months ago she opened her car door and in an effort not to hit herself she stepped back oddly on the right leg and spun around. From that point on her right leg has been bothering her. Pain can travel from the right lower back all the way down to the calf and back up. States it is hard for her to describe her pain, it is not tender but still has pain that can be excruciating. Pain has been severely limiting her activity level. She has begun taking her medication more regularly the past few days and her pain has improved since that time.  PERTINENT HISTORY:  See PMH above  PAIN:  Are you having pain? Yes:  NPRS scale: 3/10 currently, pain at worse 4/10 Pain location: Right lower back and calf Pain description: Ache Aggravating factors: Standing, walking, moving around Relieving factors: Medication, ice, rest  PRECAUTIONS: None  PATIENT GOALS: Pain relief   OBJECTIVE:  Note: Objective measures were completed at Evaluation unless otherwise noted. PATIENT SURVEYS:  PSFS: 3.66 Walking: 5 Moving around with speed: 3 Standing for a while: 3   SENSATION: WFL  MUSCLE LENGTH: Hamstring grossly [facility]  POSTURE:   Rounded shoulder posture  PALPATION: Non-tender to palpation  Lumbar CPAs grossly WFL and non-painful  LUMBAR ROM:   AROM eval  Flexion WFL  Extension 75%  Right lateral flexion WFL  Left lateral flexion WFL  Right rotation WFL  Left rotation WFL   (Blank rows = not tested)  LOWER EXTREMITY ROM:      Hip PROM grossly WFL and non-painful  LOWER EXTREMITY MMT:    MMT Right eval Left eval Rt / Lt 04/21/2024 Rt / Lt 04/28/2024 Rt / Lt 05/04/2024  Hip flexion 4- 4-     Hip extension 3 3+     Hip abduction 3- 3 3- / 3 3 / 3+ 3+ / 3+  Hip  adduction       Hip internal rotation       Hip external rotation       Knee flexion 5 5     Knee extension 5 5     Ankle dorsiflexion       Ankle plantarflexion       Ankle inversion       Ankle eversion        (Blank rows = not tested)  LUMBAR SPECIAL TESTS:  Slump and SLR negative for radicular symptoms  FUNCTIONAL TESTS:  5 times sit to stand: 16 seconds  04/28/2024: 14 seconds  GAIT: Assistive device utilized: None Level of assistance: Complete Independence Comments: Shuffling gait pattern   TREATMENT [facility] Adult PT Treatment:                                                DATE: 05/04/2024 Recumbent bike L4 x 5 min to improve endurance and workload capacity Sit to stand 3 x 10 Sidelying hip abduction 3 x 10 each LAQ with 5# 3 x 10 Standing heel raises 3 x 10 Standing hip extension 3 x 10 each  PATIENT EDUCATION:  Education details: HEP update Person educated: Patient Education method: Explanation, Demonstration, Tactile cues, Verbal cues, Handout Education comprehension: verbalized understanding, returned demonstration, verbal cues required, tactile cues required, and needs further education  HOME EXERCISE PROGRAM: Access Code: [email or web address]   ASSESSMENT: CLINICAL IMPRESSION: Patient tolerated therapy well with no adverse effects. Therapy focused on continued strengthening for her hips and LE with good tolerance. She was able to progress to some standing exercises and increased number of repetitions for each exercise. Updated her HEP to progress hip strengthening for home. Patient would benefit from continued skilled PT to progress mobility and strength in order to reduce pain and maximize functional ability.   Eval: Patient is a 78 y.o. female who was seen today for physical therapy evaluation and treatment for chronic right leg pain that does seem to be related to the lumbar spine. She has mild limitations with lumbar spine mobility and gross hip and core strength  deficits, but was not reporting much pain or reproduction of her right leg symptoms with testing. She reported majority of pain this visit was located to the right gluteal region but unable to reproduce any pain with palpation of the lumbar spine or right hip/leg. Exercises provided to address strength deficits.  OBJECTIVE IMPAIRMENTS: Abnormal gait, decreased activity tolerance, decreased balance, decreased ROM, decreased strength, and pain.   ACTIVITY LIMITATIONS: carrying, lifting, standing, and locomotion level  PARTICIPATION LIMITATIONS: meal prep, cleaning, shopping, and community  activity  PERSONAL FACTORS: Fitness, Past/current experiences, and Time since onset of injury/illness/exacerbation are also affecting patient's functional outcome.    GOALS: Goals reviewed with patient? Yes  SHORT TERM GOALS: Target date: 04/26/2024  Patient will be I with initial HEP in order to progress with therapy. Baseline: HEP provided at eval 04/28/2024: independent with initial HEP Goal status: MET  2.  Patient will report right leg pain at worst with activity </= 5/10 in order to reduce functional limitations Baseline: 7/10 04/28/2024: 6/10 05/04/2024: report 4/10 Goal status: MET  LONG TERM GOALS: Target date: 05/24/2024  Patient will be I with final HEP to maintain progress from PT. Baseline: HEP provided at eval Goal status: INITIAL  2.  Patient will report PSFS >/= 6 in order to indicate improvement in her functional status Baseline: 3.66 Goal status: INITIAL  3.  Patient will demonstrate hip strength >/= 4/5 MMT in order to improve her walking and activity tolerance Baseline: see limitations above Goal status: INITIAL  4.  Patient will perform 5xSTS </= 12 seconds in order to indicate improvement in strength, mobility, and reduced fall risk Baseline: 16 seconds 04/28/2024: 14 seconds Goal status: INITIAL   PLAN: PT FREQUENCY: 1x/week  PT DURATION: 1 week  PLANNED  INTERVENTIONS: 97164- PT Re-evaluation, 97750- Physical Performance Testing, 97110-Therapeutic exercises, 97530- Therapeutic activity, 97112- Neuromuscular re-education, 97535- Self Care, 02859- Manual therapy, 97116- Gait training, [phone] (1-2 muscles), 20561 (3+ muscles)- Dry Needling, Patient/Family education, Balance training, Stair training, Joint mobilization, Joint manipulation, Spinal manipulation, Spinal mobilization, Cryotherapy, and Moist heat.  PLAN FOR NEXT SESSION: Review HEP and progress PRN, continue progression for core, hip, and LE strengthening, gait training, balance training    Elaine Daring, PT, DPT, LAT, ATC 05/04/24  9:27 AM Phone: [phone] Fax: [phone]

## 2024-05-04 NOTE — Patient Instructions (Signed)
 Access Code: [email or web address] URL: [email or web address] Date: 05/04/2024 Prepared by: Elaine Daring  Exercises - Sidelying Hip Abduction  - 1 x daily - 3 sets - 10 reps - Bridge  - 1 x daily - 3 sets - 10 reps - 5 seconds hold - Active Straight Leg Raise with Quad Set  - 1 x daily - 3 sets - 10 reps - Sit to Stand Without Arm Support  - 1 x daily - 3 sets - 10 reps

## 2024-05-11 ENCOUNTER — Encounter: Admitting: Physical Therapy

## 2024-05-12 ENCOUNTER — Ambulatory Visit: Payer: Self-pay

## 2024-05-12 NOTE — Telephone Encounter (Signed)
 FYI Only or Action Required?: FYI only for provider.  Patient was last seen in primary care on 03/24/2024 by Johnny Garnette LABOR, MD.  Called Nurse Triage reporting Hypertension.  Symptoms began today.  Interventions attempted: Nothing.  Symptoms are: gradually improving.  Triage Disposition: See PCP When Office is Open (Within 3 Days)  Patient/caregiver understands and will follow disposition?: Yes     Copied from CRM #[id number]. Topic: Clinical - Red Word Triage >> May 12, 2024 12:52 PM Carlyon D wrote: Red Word that prompted transfer to Nurse Triage: Pt is stating she has an elevated blood pressure.      Reason for Disposition  Systolic BP >= 160 OR Diastolic >= 100    170 systolic earlier today  Answer Assessment - Initial Assessment Questions 1. BLOOD PRESSURE: What is your blood pressure? Did you take at least two measurements 5 minutes apart?     170 systolic earlier today, 155/81 2. ONSET: When did you take your blood pressure?     Today at an appointment  3. HOW: How did you take your blood pressure? (e.g., automatic home BP monitor, visiting nurse)     Automatic BP cuff  4. HISTORY: Do you have a history of high blood pressure?     Yes 5. MEDICINES: Are you taking any medicines for blood pressure? Have you missed any doses recently?     Yes, has not missed any doses  6. OTHER SYMPTOMS: Do you have any symptoms? (e.g., blurred vision, chest pain, difficulty breathing, headache, weakness)     Right leg pain  Answer Assessment - Initial Assessment Questions 1. ONSET: When did the pain start?      2-3 days ago  2. LOCATION: Where is the pain located?      Right leg in the back of the leg  3. PAIN: How bad is the pain?    (Scale 1-10; or mild, moderate, severe)     5/10 4. WORK OR EXERCISE: Has there been any recent work or exercise that involved this part of the body?      No 5. CAUSE: What do you think is causing the leg pain?     Unsure   6. OTHER SYMPTOMS: Do you have any other symptoms? (e.g., chest pain, back pain, breathing difficulty, swelling, rash, fever, numbness, weakness)     Elevated blood pressure  Protocols used: Blood Pressure - High-A-AH, Leg Pain-A-AH

## 2024-05-13 ENCOUNTER — Encounter: Payer: Self-pay | Admitting: Family Medicine

## 2024-05-13 ENCOUNTER — Ambulatory Visit (INDEPENDENT_AMBULATORY_CARE_PROVIDER_SITE_OTHER): Admitting: Family Medicine

## 2024-05-13 VITALS — BP 128/64 | HR 59 | Temp 97.7°F | Wt 163.0 lb

## 2024-05-13 DIAGNOSIS — I10 Essential (primary) hypertension: Secondary | ICD-10-CM | POA: Diagnosis not present

## 2024-05-13 DIAGNOSIS — G8929 Other chronic pain: Secondary | ICD-10-CM

## 2024-05-13 DIAGNOSIS — M5441 Lumbago with sciatica, right side: Secondary | ICD-10-CM

## 2024-05-13 DIAGNOSIS — K5900 Constipation, unspecified: Secondary | ICD-10-CM | POA: Diagnosis not present

## 2024-05-13 NOTE — Progress Notes (Signed)
   Subjective:    Patient ID: Anne [name], female    DOB: 02/17/1946, 78 y.o.   MRN: [id number]  HPI Here with concerns about her BP. Most of the time lately her BP has been very well controlled. She has been going to PT to help with right sided low back pain and right sciatica. Then 2 days ago she developed more severe pain in the right lower back and the right leg. She had to cancel the PT that day because of this pain. Her BP at home also went up as high as 172 systolic. No chest pain or SOB. She notes that she had not had a BM for several days, and then yesterday she passed a large stool. Almost immediately her back and leg pain improved and her BP went back down. Today she feels fairly well.    Review of Systems  Constitutional: Negative.   Respiratory: Negative.    Cardiovascular:  Positive for leg swelling. Negative for chest pain and palpitations.  Gastrointestinal:  Positive for constipation. Negative for abdominal distention, abdominal pain, blood in stool, nausea and vomiting.  Genitourinary: Negative.   Musculoskeletal:  Positive for back pain.       Objective:   Physical Exam Constitutional:      General: She is not in acute distress.    Appearance: Normal appearance.  Cardiovascular:     Rate and Rhythm: Normal rate and regular rhythm.     Pulses: Normal pulses.     Heart sounds: Normal heart sounds.  Pulmonary:     Effort: Pulmonary effort is normal.     Breath sounds: Normal breath sounds.  Abdominal:     General: Abdomen is flat. Bowel sounds are normal. There is no distension.     Palpations: Abdomen is soft. There is no mass.     Tenderness: There is no abdominal tenderness. There is no guarding.     Hernia: No hernia is present.  Musculoskeletal:     Comments: 1+ edema in the right lower leg. None in the left leg  Neurological:     Mental Status: She is alert.           Assessment & Plan:  She had an exacerbation of her back pain and sciatica  several days ago, and she had elevated BP readings the same day. Then after she passed a large BM, the pain improved and the BP returned to normal. It seems that a large stool burden aggravates the sciatic pain and the pain makes her BP go up transiently. Today she is back to baseline. I advised her to take Miralax every day from now on to prevent this situation from recurring.  Garnette Olmsted, MD

## 2024-05-14 ENCOUNTER — Encounter: Admitting: Physical Therapy

## 2024-05-17 ENCOUNTER — Telehealth: Payer: Self-pay

## 2024-05-17 NOTE — Telephone Encounter (Signed)
 Received via email [facility] pap [facility] today has mail out to pt home and fax provider portion today.

## 2024-05-19 ENCOUNTER — Other Ambulatory Visit: Payer: Self-pay

## 2024-05-19 ENCOUNTER — Encounter: Payer: Self-pay | Admitting: Physical Therapy

## 2024-05-19 ENCOUNTER — Ambulatory Visit: Admitting: Physical Therapy

## 2024-05-19 DIAGNOSIS — M5459 Other low back pain: Secondary | ICD-10-CM | POA: Diagnosis not present

## 2024-05-19 DIAGNOSIS — M79604 Pain in right leg: Secondary | ICD-10-CM

## 2024-05-19 DIAGNOSIS — M6281 Muscle weakness (generalized): Secondary | ICD-10-CM

## 2024-05-19 NOTE — Therapy (Signed)
 OUTPATIENT PHYSICAL THERAPY TREATMENT   Patient Name: Anne [name] MRN: [id number] DOB:Nov 18, 1945, 78 y.o., female Today's Date: 05/19/2024   END OF SESSION:  PT End of Session - 05/19/24 1153     Visit Number 5    Number of Visits 9    Date for PT Re-Evaluation 05/24/24    Authorization Type [employer] MCR    Authorization Time Period 03/29/2024 - 05/24/2024    Authorization - Visit Number 5    Authorization - Number of Visits 8    PT Start Time 1146    PT Stop Time 1225    PT Time Calculation (min) 39 min    Activity Tolerance Patient tolerated treatment well    Behavior During Therapy WFL for tasks assessed/performed              Past Medical History:  Diagnosis Date   GERD (gastroesophageal reflux disease) 08/29/2021   Hyperlipidemia    Hypertension    Osteopenia    per DEXA on 07-19-09   Stroke (HCC) 09/14/2007   has mild left leg weakness, saw Dr. Chalice   History reviewed. No pertinent surgical history. Patient Active Problem List   Diagnosis Date Noted   Constipation 05/13/2024   Low back pain 03/24/2024   Acute pulmonary embolism (HCC) 02/12/2024   Hypokalemia 02/12/2024   Vertigo 12/01/2023   Bite, insect 04/30/2023   COVID-19 virus infection 05/27/2022   GERD (gastroesophageal reflux disease) 08/29/2021   Bradycardia, unspecified 01/02/2018   Mallet deformity of fourth finger, right 02/02/2014   Allergic rhinitis 05/08/2010   DIZZINESS 02/01/2010   Lipoma 08/11/2009   Disorder of bone and cartilage 08/11/2009   SUPRAVENTRICULAR PREMATURE BEATS 07/28/2008   FATIGUE 06/16/2008   MYOSITIS 05/12/2008   Hyperlipidemia 11/24/2007   Essential hypertension 11/24/2007    PCP: Johnny Garnette LABOR, MD  REFERRING PROVIDER: Joane Artist RAMAN, MD  REFERRING DIAG: Lumbar radiculopathy  Rationale for Evaluation and Treatment: Rehabilitation  THERAPY DIAG:  Pain in right leg  Other low back pain  Muscle weakness (generalized)  ONSET DATE:  12/26/2023   SUBJECTIVE:         SUBJECTIVE STATEMENT: Patient reports she couldn't come in last week because of her leg pain and her blood pressure was elevated. She reports she still has some leg pain but the severe leg pain stopped about a day ago. She reports the pain was primarily in the right calf and would travel to the front of the right hip.  Eval: Patient reports about 3 months ago she opened her car door and in an effort not to hit herself she stepped back oddly on the right leg and spun around. From that point on her right leg has been bothering her. Pain can travel from the right lower back all the way down to the calf and back up. States it is hard for her to describe her pain, it is not tender but still has pain that can be excruciating. Pain has been severely limiting her activity level. She has begun taking her medication more regularly the past few days and her pain has improved since that time.  PERTINENT HISTORY:  See PMH above  PAIN:  Are you having pain? Yes:  NPRS scale: 4/10 currently Pain location: Right lower back and calf Pain description: Ache Aggravating factors: Standing, walking, moving around Relieving factors: Medication, ice, rest  PRECAUTIONS: None  PATIENT GOALS: Pain relief   OBJECTIVE:  Note: Objective measures were completed at Evaluation unless otherwise noted.  PATIENT SURVEYS:  PSFS: 3.66 Walking: 5 Moving around with speed: 3 Standing for a while: 3   SENSATION: WFL  MUSCLE LENGTH: Hamstring grossly WFL  POSTURE:   Rounded shoulder posture  PALPATION: Non-tender to palpation  Lumbar CPAs grossly WFL and non-painful  LUMBAR ROM:   AROM eval  Flexion WFL  Extension 75%  Right lateral flexion WFL  Left lateral flexion WFL  Right rotation WFL  Left rotation WFL   (Blank rows = not tested)  LOWER EXTREMITY ROM:      Hip PROM grossly WFL and non-painful  LOWER EXTREMITY MMT:    MMT Right eval Left eval Rt /  Lt 04/21/2024 Rt / Lt 04/28/2024 Rt / Lt 05/04/2024  Hip flexion 4- 4-     Hip extension 3 3+     Hip abduction 3- 3 3- / 3 3 / 3+ 3+ / 3+  Hip adduction       Hip internal rotation       Hip external rotation       Knee flexion 5 5     Knee extension 5 5     Ankle dorsiflexion       Ankle plantarflexion       Ankle inversion       Ankle eversion        (Blank rows = not tested)  LUMBAR SPECIAL TESTS:  Slump and SLR negative for radicular symptoms  FUNCTIONAL TESTS:  5 times sit to stand: 16 seconds  04/28/2024: 14 seconds  GAIT: Assistive device utilized: None Level of assistance: Complete Independence Comments: Shuffling gait pattern   TREATMENT [facility] Adult PT Treatment:                                                DATE: 05/19/2024 Recumbent bike L4 x 6 min to improve endurance and workload capacity LAD for right x 5 bouts using belt LTR x 10 Piriformis stretch 2 x 20 sec each Slant board calf stretch 3 x 30 sec Hooklying clamshell with green 2 x 10 Sit to stand 3 x 10 Standing calf stretch at counter for HEP demo  PATIENT EDUCATION:  Education details: HEP update Person educated: Patient Education method: Programmer, multimedia, Demonstration, Actor cues, Verbal cues, Handouts Education comprehension: verbalized understanding, returned demonstration, verbal cues required, tactile cues required, and needs further education  HOME EXERCISE PROGRAM: Access Code: [email or web address]   ASSESSMENT: CLINICAL IMPRESSION: Patient tolerated therapy well with no adverse effects. She arrives reporting recent exacerbation of right leg pain and currently reports improvement but still with some calf tightness and mild leg pain. Her description of pain does seem consistent with lumbar related pain but was doing better today. She did report feeling improvement following LAD and gentle stretching. She was able to perform some light hip and LE strengthening. Updated her HEP to incorporate some calf  stretching as she reports calf tightness after recent pain exacerbation. Patient would benefit from continued skilled PT to progress mobility and strength in order to reduce pain and maximize functional ability.   Eval: Patient is a 78 y.o. female who was seen today for physical therapy evaluation and treatment for chronic right leg pain that does seem to be related to the lumbar spine. She has mild limitations with lumbar spine mobility and gross hip and core strength deficits, but was not reporting  much pain or reproduction of her right leg symptoms with testing. She reported majority of pain this visit was located to the right gluteal region but unable to reproduce any pain with palpation of the lumbar spine or right hip/leg. Exercises provided to address strength deficits.  OBJECTIVE IMPAIRMENTS: Abnormal gait, decreased activity tolerance, decreased balance, decreased ROM, decreased strength, and pain.   ACTIVITY LIMITATIONS: carrying, lifting, standing, and locomotion level  PARTICIPATION LIMITATIONS: meal prep, cleaning, shopping, and community activity  PERSONAL FACTORS: Fitness, Past/current experiences, and Time since onset of injury/illness/exacerbation are also affecting patient's functional outcome.    GOALS: Goals reviewed with patient? Yes  SHORT TERM GOALS: Target date: 04/26/2024  Patient will be I with initial HEP in order to progress with therapy. Baseline: HEP provided at eval 04/28/2024: independent with initial HEP Goal status: MET  2.  Patient will report right leg pain at worst with activity </= 5/10 in order to reduce functional limitations Baseline: 7/10 04/28/2024: 6/10 05/04/2024: report 4/10 Goal status: MET  LONG TERM GOALS: Target date: 05/24/2024  Patient will be I with final HEP to maintain progress from PT. Baseline: HEP provided at eval Goal status: INITIAL  2.  Patient will report PSFS >/= 6 in order to indicate improvement in her functional  status Baseline: 3.66 Goal status: INITIAL  3.  Patient will demonstrate hip strength >/= 4/5 MMT in order to improve her walking and activity tolerance Baseline: see limitations above Goal status: INITIAL  4.  Patient will perform 5xSTS </= 12 seconds in order to indicate improvement in strength, mobility, and reduced fall risk Baseline: 16 seconds 04/28/2024: 14 seconds Goal status: INITIAL   PLAN: PT FREQUENCY: 1x/week  PT DURATION: 1 week  PLANNED INTERVENTIONS: 97164- PT Re-evaluation, 97750- Physical Performance Testing, 97110-Therapeutic exercises, 97530- Therapeutic activity, 97112- Neuromuscular re-education, 97535- Self Care, 02859- Manual therapy, 97116- Gait training, [phone] (1-2 muscles), 20561 (3+ muscles)- Dry Needling, Patient/Family education, Balance training, Stair training, Joint mobilization, Joint manipulation, Spinal manipulation, Spinal mobilization, Cryotherapy, and Moist heat.  PLAN FOR NEXT SESSION: Review HEP and progress PRN, continue progression for core, hip, and LE strengthening, gait training, balance training    Elaine Daring, PT, DPT, LAT, ATC 05/19/24  12:34 PM Phone: [phone] Fax: [phone]

## 2024-05-19 NOTE — Patient Instructions (Signed)
 Access Code: [email or web address] URL: [email or web address] Date: 05/19/2024 Prepared by: Elaine Daring  Exercises - Sidelying Hip Abduction  - 1 x daily - 3 sets - 10 reps - Bridge  - 1 x daily - 3 sets - 10 reps - 5 seconds hold - Active Straight Leg Raise with Quad Set  - 1 x daily - 3 sets - 10 reps - Sit to Stand Without Arm Support  - 1 x daily - 3 sets - 10 reps - Standing Gastroc Stretch at Counter  - 1 x daily - 3 reps - 30 seconds hold

## 2024-05-25 NOTE — Telephone Encounter (Signed)
 Gave pt a call to follow up on mail out PAP BMS Eliquis ,spoke with pt explain she has received the application but has not have a change to fill out pt will try this week. And will mail back along proof of income.

## 2024-05-27 ENCOUNTER — Encounter: Admitting: Physical Therapy

## 2024-06-02 NOTE — Therapy (Signed)
 OUTPATIENT PHYSICAL THERAPY TREATMENT   Patient Name: Anne [name] MRN: [id number] DOB:[birth year], 78 y.o., female Today's Date: 06/03/2024   END OF SESSION:  PT End of Session - 06/03/24 1522     Visit Number 6    Number of Visits 12    Date for PT Re-Evaluation 07/15/24    Authorization Type [employer] MCR    Authorization Time Period 03/29/2024 - 05/24/2024    PT Start Time 1518    PT Stop Time 1600    PT Time Calculation (min) 42 min    Activity Tolerance Patient tolerated treatment well    Behavior During Therapy [facility] for tasks assessed/performed               Past Medical History:  Diagnosis Date   GERD (gastroesophageal reflux disease) 08/29/2021   Hyperlipidemia    Hypertension    Osteopenia    per DEXA on 07-19-09   Stroke (HCC) 09/14/2007   has mild left leg weakness, saw Dr. Chalice   History reviewed. No pertinent surgical history. Patient Active Problem List   Diagnosis Date Noted   Constipation 05/13/2024   Low back pain 03/24/2024   Acute pulmonary embolism (HCC) 02/12/2024   Hypokalemia 02/12/2024   Vertigo 12/01/2023   Bite, insect 04/30/2023   COVID-19 virus infection 05/27/2022   GERD (gastroesophageal reflux disease) 08/29/2021   Bradycardia, unspecified 01/02/2018   Mallet deformity of fourth finger, right 02/02/2014   Allergic rhinitis 05/08/2010   DIZZINESS 02/01/2010   Lipoma 08/11/2009   Disorder of bone and cartilage 08/11/2009   SUPRAVENTRICULAR PREMATURE BEATS 07/28/2008   FATIGUE 06/16/2008   MYOSITIS 05/12/2008   Hyperlipidemia 11/24/2007   Essential hypertension 11/24/2007    PCP: Johnny Garnette LABOR, MD  REFERRING PROVIDER: Joane Artist RAMAN, MD  REFERRING DIAG: Lumbar radiculopathy  Rationale for Evaluation and Treatment: Rehabilitation  THERAPY DIAG:  Pain in right leg  Other low back pain  Muscle weakness (generalized)  ONSET DATE: 12/26/2023   SUBJECTIVE:         SUBJECTIVE STATEMENT: Patient reports she had  to miss her last visit because a tree limb fell in her driveway. She reports some right lower back pain today that she feels like is from the shoes she is wearing, and she does feel like her right leg feels heavy.   Eval: Patient reports about 3 months ago she opened her car door and in an effort not to hit herself she stepped back oddly on the right leg and spun around. From that point on her right leg has been bothering her. Pain can travel from the right lower back all the way down to the calf and back up. States it is hard for her to describe her pain, it is not tender but still has pain that can be excruciating. Pain has been severely limiting her activity level. She has begun taking her medication more regularly the past few days and her pain has improved since that time.  PERTINENT HISTORY:  See PMH above  PAIN:  Are you having pain? Yes:  NPRS scale: 4/10 currently Pain location: Right lower back Pain description: Ache Aggravating factors: Standing, walking, moving around Relieving factors: Medication, ice, rest  PRECAUTIONS: None  PATIENT GOALS: Pain relief   OBJECTIVE:  Note: Objective measures were completed at Evaluation unless otherwise noted. PATIENT SURVEYS:  PSFS: 3.66 Walking: 5 Moving around with speed: 3 Standing for a while: 3  06/03/2024: PSFS: 5 Walking: 5 Moving around with speed: 5  Standing for a while: 5   SENSATION: WFL  MUSCLE LENGTH: Hamstring grossly WFL  POSTURE:   Rounded shoulder posture  PALPATION: Non-tender to palpation  Lumbar CPAs grossly WFL and non-painful  LUMBAR ROM:   AROM eval  Flexion WFL  Extension 75%  Right lateral flexion WFL  Left lateral flexion WFL  Right rotation WFL  Left rotation WFL   (Blank rows = not tested)  LOWER EXTREMITY ROM:      Hip PROM grossly WFL and non-painful  LOWER EXTREMITY MMT:    MMT Right eval Left eval Rt / Lt 04/21/2024 Rt / Lt 04/28/2024 Rt / Lt 05/04/2024 Rt / Lt 06/03/2024   Hip flexion 4- 4-      Hip extension 3 3+    3+ / 3+  Hip abduction 3- 3 3- / 3 3 / 3+ 3+ / 3+ 3+ / 3+  Hip adduction        Hip internal rotation        Hip external rotation        Knee flexion 5 5      Knee extension 5 5      Ankle dorsiflexion        Ankle plantarflexion        Ankle inversion        Ankle eversion         (Blank rows = not tested)  LUMBAR SPECIAL TESTS:  Slump and SLR negative for radicular symptoms  FUNCTIONAL TESTS:  5 times sit to stand: 16 seconds  04/28/2024: 14 seconds  06/03/2024: 13 seconds  GAIT: Assistive device utilized: None Level of assistance: Complete Independence Comments: Shuffling gait pattern   TREATMENT [facility] Adult PT Treatment:                                                DATE: 06/03/2024 Recumbent bike L4 x 5 min to improve endurance and workload capacity Sit to stand 3 x 10 Standing heel raise 2 x 10 Standing hip extension and abduction with yellow at knees 2 x 10 each Forward 4 step-up 2 x 10 each LAQ with 5# 3 x 10 each Bridge 2 x 10 SLR 2 x 10 each  PATIENT EDUCATION:  Education details: HEP Person educated: Patient Education method: Programmer, multimedia, Demonstration, Actor cues, Verbal cues Education comprehension: verbalized understanding, returned demonstration, verbal cues required, tactile cues required, and needs further education  HOME EXERCISE PROGRAM: Access Code: [email or web address]   ASSESSMENT: CLINICAL IMPRESSION: Patient tolerated therapy well with no adverse effects. Therapy focused on progressing her LE strengthening with good tolerance. She was able to progress with more standing exercises this visit and incorporated more banded hip strengthening and step-ups.  She did not report any increased pain in therapy and reported improvement in her symptoms post session. No changes made to her HEP this visit. Patient would benefit from continued skilled PT to progress mobility and strength in order to reduce pain and  maximize functional ability, so will extend her PT POC for 6 more weeks.   Eval: Patient is a 78 y.o. female who was seen today for physical therapy evaluation and treatment for chronic right leg pain that does seem to be related to the lumbar spine. She has mild limitations with lumbar spine mobility and gross hip and core strength deficits, but was not reporting much  pain or reproduction of her right leg symptoms with testing. She reported majority of pain this visit was located to the right gluteal region but unable to reproduce any pain with palpation of the lumbar spine or right hip/leg. Exercises provided to address strength deficits.  OBJECTIVE IMPAIRMENTS: Abnormal gait, decreased activity tolerance, decreased balance, decreased ROM, decreased strength, and pain.   ACTIVITY LIMITATIONS: carrying, lifting, standing, and locomotion level  PARTICIPATION LIMITATIONS: meal prep, cleaning, shopping, and community activity  PERSONAL FACTORS: Fitness, Past/current experiences, and Time since onset of injury/illness/exacerbation are also affecting patient's functional outcome.    GOALS: Goals reviewed with patient? Yes  SHORT TERM GOALS: Target date: 04/26/2024  Patient will be I with initial HEP in order to progress with therapy. Baseline: HEP provided at eval 04/28/2024: independent with initial HEP Goal status: MET  2.  Patient will report right leg pain at worst with activity </= 5/10 in order to reduce functional limitations Baseline: 7/10 04/28/2024: 6/10 05/04/2024: report 4/10 Goal status: MET  LONG TERM GOALS: Target date: 07/15/2024  Patient will be I with final HEP to maintain progress from PT. Baseline: HEP provided at eval 06/03/2024: progressing Goal status: ONGOING  2.  Patient will report PSFS >/= 6 in order to indicate improvement in her functional status Baseline: 3.66 06/03/2024: 5 Goal status: ONGOING  3.  Patient will demonstrate hip strength >/= 4/5 MMT in  order to improve her walking and activity tolerance Baseline: see limitations above 06/03/2024: see limitations above Goal status: ONGOING  4.  Patient will perform 5xSTS </= 12 seconds in order to indicate improvement in strength, mobility, and reduced fall risk Baseline: 16 seconds 04/28/2024: 14 seconds 06/03/2024: 13 seconds Goal status: ONGOING   PLAN: PT FREQUENCY: 1x/week  PT DURATION: 6 weeks  PLANNED INTERVENTIONS: 97164- PT Re-evaluation, 97750- Physical Performance Testing, 97110-Therapeutic exercises, 97530- Therapeutic activity, 97112- Neuromuscular re-education, 97535- Self Care, 02859- Manual therapy, 97116- Gait training, [phone] (1-2 muscles), 20561 (3+ muscles)- Dry Needling, Patient/Family education, Balance training, Stair training, Joint mobilization, Joint manipulation, Spinal manipulation, Spinal mobilization, Cryotherapy, and Moist heat.  PLAN FOR NEXT SESSION: Review HEP and progress PRN, continue progression for core, hip, and LE strengthening, gait training, balance training    Elaine Daring, PT, DPT, LAT, ATC 06/03/24  4:00 PM Phone: [phone] Fax: [phone]

## 2024-06-03 ENCOUNTER — Encounter: Payer: Self-pay | Admitting: Physical Therapy

## 2024-06-03 ENCOUNTER — Other Ambulatory Visit: Payer: Self-pay

## 2024-06-03 ENCOUNTER — Ambulatory Visit: Admitting: Physical Therapy

## 2024-06-03 DIAGNOSIS — M5459 Other low back pain: Secondary | ICD-10-CM

## 2024-06-03 DIAGNOSIS — M79604 Pain in right leg: Secondary | ICD-10-CM

## 2024-06-03 DIAGNOSIS — M6281 Muscle weakness (generalized): Secondary | ICD-10-CM

## 2024-06-07 ENCOUNTER — Ambulatory Visit: Payer: Self-pay

## 2024-06-07 ENCOUNTER — Emergency Department (HOSPITAL_COMMUNITY)

## 2024-06-07 ENCOUNTER — Emergency Department (HOSPITAL_COMMUNITY)
Admission: EM | Admit: 2024-06-07 | Discharge: 2024-06-07 | Disposition: A | Discharge status: Home / Self Care | Attending: Emergency Medicine | Admitting: Emergency Medicine

## 2024-06-07 ENCOUNTER — Encounter (HOSPITAL_COMMUNITY): Payer: Self-pay

## 2024-06-07 ENCOUNTER — Other Ambulatory Visit: Payer: Self-pay

## 2024-06-07 DIAGNOSIS — Z79899 Other long term (current) drug therapy: Secondary | ICD-10-CM | POA: Diagnosis not present

## 2024-06-07 DIAGNOSIS — Z86718 Personal history of other venous thrombosis and embolism: Secondary | ICD-10-CM | POA: Diagnosis not present

## 2024-06-07 DIAGNOSIS — I517 Cardiomegaly: Secondary | ICD-10-CM | POA: Diagnosis not present

## 2024-06-07 DIAGNOSIS — H93A2 Pulsatile tinnitus, left ear: Secondary | ICD-10-CM | POA: Insufficient documentation

## 2024-06-07 DIAGNOSIS — Z91040 Latex allergy status: Secondary | ICD-10-CM | POA: Insufficient documentation

## 2024-06-07 DIAGNOSIS — H9312 Tinnitus, left ear: Secondary | ICD-10-CM | POA: Diagnosis not present

## 2024-06-07 DIAGNOSIS — I700 Atherosclerosis of aorta: Secondary | ICD-10-CM | POA: Diagnosis not present

## 2024-06-07 DIAGNOSIS — Z7901 Long term (current) use of anticoagulants: Secondary | ICD-10-CM | POA: Insufficient documentation

## 2024-06-07 DIAGNOSIS — R42 Dizziness and giddiness: Secondary | ICD-10-CM | POA: Diagnosis not present

## 2024-06-07 DIAGNOSIS — R0789 Other chest pain: Secondary | ICD-10-CM | POA: Diagnosis not present

## 2024-06-07 DIAGNOSIS — R079 Chest pain, unspecified: Secondary | ICD-10-CM | POA: Insufficient documentation

## 2024-06-07 DIAGNOSIS — D72829 Elevated white blood cell count, unspecified: Secondary | ICD-10-CM | POA: Insufficient documentation

## 2024-06-07 LAB — CBC
HCT: 40 % (ref 36.0–46.0)
Hemoglobin: 13.4 g/dL (ref 12.0–15.0)
MCH: 30.5 pg (ref 26.0–34.0)
MCHC: 33.5 g/dL (ref 30.0–36.0)
MCV: 90.9 fL (ref 80.0–100.0)
Platelets: 438 K/uL — ABNORMAL HIGH (ref 150–400)
RBC: 4.4 MIL/uL (ref 3.87–5.11)
RDW: 13 % (ref 11.5–15.5)
WBC: 10.6 K/uL — ABNORMAL HIGH (ref 4.0–10.5)
nRBC: 0 % (ref 0.0–0.2)

## 2024-06-07 LAB — URINALYSIS, ROUTINE W REFLEX MICROSCOPIC
Bacteria, UA: NONE SEEN
Bilirubin Urine: NEGATIVE
Glucose, UA: NEGATIVE mg/dL
Hgb urine dipstick: NEGATIVE
Ketones, ur: NEGATIVE mg/dL
Nitrite: NEGATIVE
Protein, ur: NEGATIVE mg/dL
Specific Gravity, Urine: 1.011 (ref 1.005–1.030)
pH: 7 (ref 5.0–8.0)

## 2024-06-07 LAB — TROPONIN I (HIGH SENSITIVITY)
Troponin I (High Sensitivity): 10 ng/L (ref <18)
Troponin I (High Sensitivity): 10 ng/L (ref <18)

## 2024-06-07 LAB — BASIC METABOLIC PANEL WITH GFR
Anion gap: 9 (ref 5–15)
BUN: 14 mg/dL (ref 8–23)
CO2: 26 mmol/L (ref 22–32)
Calcium: 9.2 mg/dL (ref 8.9–10.3)
Chloride: 101 mmol/L (ref 98–111)
Creatinine, Ser: 0.64 mg/dL (ref 0.44–1.00)
GFR, Estimated: >60 mL/min (ref >60)
Glucose, Bld: 113 mg/dL — ABNORMAL HIGH (ref 70–99)
Potassium: 3.5 mmol/L (ref 3.5–5.1)
Sodium: 136 mmol/L (ref 135–145)

## 2024-06-07 NOTE — ED Provider Notes (Signed)
 [place] EMERGENCY DEPARTMENT AT [facility] Provider Note   CSN: [id number] Arrival date & time: 06/07/24  1203     Patient presents with: Chest Pain   Anne [name] is a 78 y.o. female.   78 year old female presenting with multiple complaints.  Patient tells me that 2 days ago she had some dull chest pain without radiation, she also tells me at times it was sharp, this occurred again last night when she was doing light activity like walking around the house.  She was nauseous 2 days ago around the same time that she had chest pain, but she attributed this to the fact that she had not eaten much that day.   She is not currently having chest pain. She also complains of urinary frequency, denies dysuria/hematuria.  She reports feeling that her vision has been more blurred over the past 2 weeks, specifically when reading the newspaper, she was told previously that she had cataracts in both eyes but at that time was not told she needed any kind of surgical intervention.  She also complains of several days of dizziness, stating it feels like I will fall sometimes, says sometimes it is like a room spinning sensation, denies syncope/presyncope.  She complains of a pulsating sound in her left ear, particular when she lays down to go to bed and rolls on that side, denies ear pain/drainage.  Denies shortness of breath, fever, palpitations.  History of right lower extremity DVT, she is on Eliquis  and is compliant with this medication.   Chest Pain      Prior to Admission medications   Medication Sig Start Date End Date Taking? Authorizing Provider  amLODipine  (NORVASC ) 10 MG tablet Take 1 tablet (10 mg total) by mouth daily. 09/08/23   Nahser, Aleene PARAS, MD  apixaban  (ELIQUIS ) 5 MG TABS tablet Take 1 tablet (5 mg total) by mouth 2 (two) times daily. 03/03/24   Johnny Garnette LABOR, MD  Calcium -Vitamin D (CALTRATE 600 PLUS-VIT D PO) Take 1 tablet by mouth daily.    [provider]  ezetimibe  (ZETIA ) 10 MG tablet Take 1 tablet (10 mg total) by mouth daily. 09/18/23   Nahser, Aleene PARAS, MD  gabapentin  (NEURONTIN ) 100 MG capsule Take 1-3 capsules (100-300 mg total) by mouth at bedtime as needed. 03/16/24   Corey, Evan S, MD  hydrochlorothiazide  (HYDRODIURIL ) 25 MG tablet TAKE 1 TABLET BY MOUTH EVERY DAY 04/30/24   Johnny Garnette LABOR, MD  HYDROcodone  bit-homatropine [facility]) 5-1.5 MG/5ML syrup Take 5 mLs by mouth every 4 (four) hours as needed. 03/05/24   Johnny Garnette LABOR, MD  influenza vaccine adjuvanted (FLUAD ) 0.5 ML injection Inject into the muscle. 07/22/23   Luiz Channel, MD  ketoconazole  (NIZORAL ) 2 % shampoo Apply 1 Application topically 2 (two) times a week. 08/04/23   Johnny Garnette LABOR, MD  KLOR-CON  M20 20 MEQ tablet TAKE 1 TABLET BY MOUTH EVERY DAY 09/22/23   Johnny Garnette LABOR, MD  loratadine  (CLARITIN ) 10 MG tablet Take 1 tablet (10 mg total) by mouth daily. 01/31/21   Johnny Garnette LABOR, MD  losartan  (COZAAR ) 50 MG tablet Take 1 tablet (50 mg total) by mouth daily. 09/08/23   Nahser, Aleene PARAS, MD  meclizine  (ANTIVERT ) 25 MG tablet Take 1 tablet (25 mg total) by mouth every other day as needed for dizziness. 12/01/23   Johnny Garnette LABOR, MD  Multiple Vitamins-Minerals (CENTRUM SILVER PO) Take 1 tablet by mouth daily.    [provider]  ondansetron  (  ZOFRAN -ODT) 4 MG disintegrating tablet Take 1 tablet (4 mg total) by mouth every 8 (eight) hours as needed for nausea or vomiting. 05/26/22   Vonna Sharlet POUR, MD  predniSONE  (DELTASONE ) 50 MG tablet Take 1 tablet (50 mg total) by mouth daily. 03/18/24   Joane Artist RAMAN, MD  rosuvastatin  (CRESTOR ) 10 MG tablet Take 1 tablet (10 mg total) by mouth daily. Patient taking differently: Take 20 mg by mouth daily. 09/18/23   Nahser, Aleene PARAS, MD  vitamin C (ASCORBIC ACID) 500 MG tablet Take 500 mg by mouth daily.    [provider]    Allergies: Iodine, Latex, and Shellfish allergy    Review of Systems  Cardiovascular:   Positive for chest pain.    Updated Vital Signs  Vitals:   06/07/24 1212 06/07/24 1345 06/07/24 1556  BP: (!) 142/88 (!) 149/68   Pulse: 64 74   Resp: 16 16   Temp: 98.2 F (36.8 C)  98.6 F (37 C)  TempSrc: Oral  Oral  SpO2: 100% 94%      Physical Exam Vitals and nursing note reviewed.  HENT:     Head: Normocephalic.     Right Ear: Tympanic membrane normal. Tympanic membrane is not perforated, erythematous or bulging.     Left Ear: Tympanic membrane normal. Tympanic membrane is not perforated, erythematous or bulging.  Eyes:     Extraocular Movements: Extraocular movements intact.  Cardiovascular:     Rate and Rhythm: Normal rate and regular rhythm.     Heart sounds: Murmur heard.  Pulmonary:     Effort: Pulmonary effort is normal.     Breath sounds: Normal breath sounds.  Abdominal:     Palpations: Abdomen is soft.     Tenderness: There is no abdominal tenderness. There is no guarding.  Musculoskeletal:     Cervical back: Normal range of motion.     Right lower leg: No edema.     Left lower leg: No edema.     Comments: Moves all extremities spontaneously without difficulty 5/5 strength against resistance of bilateral upper and lower extremities  Skin:    General: Skin is warm and dry.  Neurological:     General: No focal deficit present.     Mental Status: She is alert and oriented to person, place, and time.     Sensory: No sensory deficit.     Motor: No weakness.     Comments: Normal cerebellar testing including rapid alternating movements and finger-to-nose Ambulates on her own without difficulty, no gait ataxia No pronator drift Facial expressions are symmetric and intact without evidence of facial droop     (all labs ordered are listed, but only abnormal results are displayed) Labs Reviewed  BASIC METABOLIC PANEL WITH GFR - Abnormal; Notable for the following components:      Result Value   Glucose, Bld 113 (*)    All other components within normal  limits  CBC - Abnormal; Notable for the following components:   WBC 10.6 (*)    Platelets 438 (*)    All other components within normal limits  URINALYSIS, ROUTINE W REFLEX MICROSCOPIC - Abnormal; Notable for the following components:   Leukocytes,Ua SMALL (*)    All other components within normal limits  URINE CULTURE  TROPONIN I (HIGH SENSITIVITY)  TROPONIN I (HIGH SENSITIVITY)    EKG: EKG Interpretation Date/Time:  Monday June 07 2024 12:19:20 EDT Ventricular Rate:  66 PR Interval:  171 QRS Duration:  98 QT  Interval:  399 QTC Calculation: 418 R Axis:   -51  Text Interpretation: Sinus arrhythmia no sig change from previous Confirmed by Armenta Canning [phone]) on 06/07/2024 12:32:19 PM  Radiology: DG Chest 2 View Result Date: 06/07/2024 CLINICAL DATA:  Chest pain and dizziness. EXAM: CHEST - 2 VIEW COMPARISON:  11/21/2023 and CT chest 02/12/2024. FINDINGS: Trachea is midline. Heart is enlarged, stable. Thoracic aorta is calcified. Lungs are clear. No pleural fluid. IMPRESSION: No acute findings. Electronically Signed   By: Newell Eke M.D.   On: 06/07/2024 13:27     Procedures   Medications Ordered in the ED - No data to display                                  Medical Decision Making This patient presents to the ED for concern of multiple complaints, this involves an extensive number of treatment options, and is a complaint that carries with it a high risk of complications and morbidity.  The differential diagnosis includes ACS, stable versus unstable angina, stroke versus TIA, BPPV, orthostatic hypotension, urinary tract infection.   Co morbidities that complicate the patient evaluation  History of DVT on Eliquis    Additional history obtained:  Additional history obtained from record review External records from outside source obtained and reviewed including most recent sports medicine notes   Lab Tests:  I Ordered, and personally interpreted labs.  The  pertinent results include: CBC notable for mild leukocytosis with white blood cell count of 10.6, otherwise unremarkable.  BMP unremarkable.  Troponin 10, repeat 10.  Urinalysis notable for small leukocytes with 6-10 white blood cells, no bacteria and negative nitrates, this is unlikely to be representative of a urinary tract infection but I will send for culture.   Imaging Studies ordered:  I ordered imaging studies including CXR  I independently visualized and interpreted imaging which showed No acute findings. I agree with the radiologist interpretation   Cardiac Monitoring: / EKG:  The patient was maintained on a cardiac monitor.  I personally viewed and interpreted the cardiac monitored which showed an underlying rhythm of: NSR  Problem List / ED Course / Critical interventions / Medication management I have reviewed the patients home medicines and have made adjustments as needed   Test / Admission - Considered:  Physical exam is largely unremarkable as above.  See above for lab/imaging interpretations. Visual acuity with glasses: Bilateral Near: 20/70R Near: 20/70L Near: 20/70.  Her vision is poor, I suspect this is likely secondary to her cataracts that she has been previously diagnosed with, she admits that it has been a while since she has seen her optometrist/ophthalmologist, I encouraged her to follow-up with them in regard to her vision as she may benefit from cataract surgery in the future to correct this. Orthostatics:  Orthostatic Lying BP- Lying: 149/68Pulse- Lying: 69 Orthostatic Sitting BP- Sitting: 146/78Pulse- Sitting: 65 Orthostatic Standing at 0 minutes BP- Standing at 0 minutes: 157/80Pulse- Standing at 0 minutes: 68 Orthostatic Standing at 3 minutes BP- Standing at 3 minutes: 156/80Pulse- Standing at 3 minutes: 70  No evidence of orthostatic hypotension. Labs and imaging are reassuring as above.  Patient does have history of aortic stenosis, was seen by Dr.  Alveta in November with plan for a repeat echocardiogram in 1 year.  Patient tells me that her cardiologist she was seeing retired, I will provide her with an ambulatory referral to cardiology in order  for her to follow-up sooner, as her next appointment is not scheduled till November. She voiced understanding and is in agreement with this plan.  Return precautions discussed, she is appropriate for discharge at this time.  Staffed with Dr. Armenta   Amount and/or Complexity of Data Reviewed Labs: ordered. Radiology: ordered.        Final diagnoses:  Chest pain, unspecified type  Pulsatile tinnitus of left ear    ED Discharge Orders          Ordered    Ambulatory referral to Cardiology       Comments: If you have not heard from the Cardiology office within the next 72 hours please call [phone].   06/07/24 1600               Glendia Rocky SAILOR, [place] 06/07/24 1602    Armenta Canning, MD 06/08/24 1756

## 2024-06-07 NOTE — Telephone Encounter (Signed)
 FYI Only or Action Required?: FYI only for provider.  Patient was last seen in primary care on 05/13/2024 by Johnny Garnette LABOR, MD.  Called Nurse Triage reporting Chest Pain.  Symptoms began a week ago.  Interventions attempted: OTC medications: Tylenol --took one yesterday and Rest, hydration, or home remedies.  Symptoms are: gradually worsening.  Triage Disposition: Go to ED Now (Notify PCP)  Patient/caregiver understands and will follow disposition?: Yes           Copied from CRM [id number]. Topic: Clinical - Red Word Triage >> Jun 07, 2024  8:13 AM [place] MATSU wrote: Red Word that prompted transfer to Nurse Triage: chest discomfort, rapid heart beating, patient states that she can hear her heart beating and is concerned.  Sleep disturbance. Reason for Disposition  History of prior blood clot in leg or lungs (i.e., deep vein thrombosis, pulmonary embolism)  Answer Assessment - Initial Assessment Questions Patient taken Eloquis 2 blood clots in her legs Every once in a while Patient states she is feeling Patient can hear her heart beat with a pulsating sound Patient also endorses some sporadic episodes of dizziness/feeling faint  Urinary Frequency x 1 week as well  Patient states she was going to go to the Emergency Room last night but didn't want to wait She is advised that with her medical history and her symptoms it is advised that she goes to the Emergency Room at this time to be evaluated Patient verbalized understanding and states her brother can take her to the hospital to the Emergency Room.    1. LOCATION: Where does it hurt?       A tingling in the center off and on per patient---not present during triage 2. RADIATION: Does the pain go anywhere else? (e.g., into neck, jaw, arms, back)     ----- 3. ONSET: When did the chest pain begin? (Minutes, hours or days)      Sporadic 4. PATTERN: Does the pain come and go, or has it been constant since it  started?  Does it get worse with exertion?      Comes and goes 5. DURATION: How long does it last (e.g., seconds, minutes, hours)     ---- 6. SEVERITY: How bad is the pain?  (e.g., Scale 1-10; mild, moderate, or severe)     Subtle feeling  ---- patient very vague about being able to describe this feeling 7. CARDIAC RISK FACTORS: Do you have any history of heart problems or risk factors for heart disease? (e.g., angina, prior heart attack; diabetes, high blood pressure, high cholesterol, smoker, or strong family history of heart disease)     significant 8. PULMONARY RISK FACTORS: Do you have any history of lung disease?  (e.g., blood clots in lung, asthma, emphysema, birth control pills)     yes 9. CAUSE: What do you think is causing the chest pain?     ------- 10. OTHER SYMPTOMS: Do you have any other symptoms? (e.g., dizziness, nausea, vomiting, sweating, fever, difficulty breathing, cough)       Urinary frequency x 1 week, dizzy different intervals  Protocols used: Chest Pain-A-AH

## 2024-06-07 NOTE — ED Triage Notes (Signed)
 Pt c/o sporadic chest pain that started yesterday. Also some dizziness. Pt states she is also hearing her heartbeat pulsating in her ear as well as blurred vision for the past three days.

## 2024-06-07 NOTE — ED Provider Notes (Incomplete)
 I provided a substantive portion of the care of this patient.  I personally made/approved the management plan for this patient and take responsibility for the patient management. {Remember to document shared critical care using edcritical dot phrase:1} EKG Interpretation Date/Time:  Monday June 07 2024 12:19:20 EDT Ventricular Rate:  66 PR Interval:  171 QRS Duration:  98 QT Interval:  399 QTC Calculation: 418 R Axis:   -51  Text Interpretation: Sinus arrhythmia no sig change from previous Confirmed by Armenta Canning [phone]) on 06/07/2024 12:32:19 PM

## 2024-06-07 NOTE — Discharge Instructions (Addendum)
 Please follow-up with your cardiologist at [facility] in regard to your chest pain, someone from their office will be in touch with you to schedule an appointment.  I have provided you with the contact information for an ear nose and throat specialist in regard to your pulsatile tinnitus, since your previous ENT provider has retired.  Return to the emergency department if your symptoms worsen.  Follow-up with your primary care provider.

## 2024-06-07 NOTE — Telephone Encounter (Signed)
 Noted

## 2024-06-08 LAB — URINE CULTURE: Culture: <10000 — AB

## 2024-06-10 NOTE — Telephone Encounter (Signed)
 Gave pt a call to follow up on mail out PAP BMS Eliquis ,pt explain she has not been feeling good reason why she has not mail back application,but will mail back in a couple of days from now.

## 2024-06-15 ENCOUNTER — Ambulatory Visit: Admitting: Physical Therapy

## 2024-06-15 ENCOUNTER — Other Ambulatory Visit: Payer: Self-pay

## 2024-06-15 ENCOUNTER — Encounter: Payer: Self-pay | Admitting: Physical Therapy

## 2024-06-15 DIAGNOSIS — M5459 Other low back pain: Secondary | ICD-10-CM

## 2024-06-15 DIAGNOSIS — M79604 Pain in right leg: Secondary | ICD-10-CM

## 2024-06-15 DIAGNOSIS — M6281 Muscle weakness (generalized): Secondary | ICD-10-CM

## 2024-06-15 NOTE — Therapy (Signed)
 OUTPATIENT PHYSICAL THERAPY TREATMENT   Patient Name: Anne [name] MRN: [id number] DOB:1946-06-12, 78 y.o., female Today's Date: 06/15/2024   END OF SESSION:  PT End of Session - 06/15/24 1510     Visit Number 7    Number of Visits 12    Date for PT Re-Evaluation 07/15/24    Authorization Type [employer] MCR    Authorization Time Period 06/03/2024 - 09/01/2024    Authorization - Visit Number 2    Authorization - Number of Visits 8    PT Start Time 1515    PT Stop Time 1555    PT Time Calculation (min) 40 min    Activity Tolerance Patient tolerated treatment well    Behavior During Therapy [facility] for tasks assessed/performed                Past Medical History:  Diagnosis Date   GERD (gastroesophageal reflux disease) 08/29/2021   Hyperlipidemia    Hypertension    Osteopenia    per DEXA on 07-19-09   Stroke (HCC) 09/14/2007   has mild left leg weakness, saw Dr. Chalice   History reviewed. No pertinent surgical history. Patient Active Problem List   Diagnosis Date Noted   Constipation 05/13/2024   Low back pain 03/24/2024   Acute pulmonary embolism (HCC) 02/12/2024   Hypokalemia 02/12/2024   Vertigo 12/01/2023   Bite, insect 04/30/2023   COVID-19 virus infection 05/27/2022   GERD (gastroesophageal reflux disease) 08/29/2021   Bradycardia, unspecified 01/02/2018   Mallet deformity of fourth finger, right 02/02/2014   Allergic rhinitis 05/08/2010   DIZZINESS 02/01/2010   Lipoma 08/11/2009   Disorder of bone and cartilage 08/11/2009   SUPRAVENTRICULAR PREMATURE BEATS 07/28/2008   FATIGUE 06/16/2008   MYOSITIS 05/12/2008   Hyperlipidemia 11/24/2007   Essential hypertension 11/24/2007    PCP: Johnny Garnette LABOR, MD  REFERRING PROVIDER: Joane Artist RAMAN, MD  REFERRING DIAG: Lumbar radiculopathy  Rationale for Evaluation and Treatment: Rehabilitation  THERAPY DIAG:  Pain in right leg  Other low back pain  Muscle weakness (generalized)  ONSET DATE:  12/26/2023   SUBJECTIVE:         SUBJECTIVE STATEMENT: Patient reports she is doing well. She did go to ER since last visit due to pulsating in her ears, denies this currently. States right leg feels stronger.  Eval: Patient reports about 3 months ago she opened her car door and in an effort not to hit herself she stepped back oddly on the right leg and spun around. From that point on her right leg has been bothering her. Pain can travel from the right lower back all the way down to the calf and back up. States it is hard for her to describe her pain, it is not tender but still has pain that can be excruciating. Pain has been severely limiting her activity level. She has begun taking her medication more regularly the past few days and her pain has improved since that time.  PERTINENT HISTORY:  See PMH above  PAIN:  Are you having pain? Yes:  NPRS scale: 4/10 currently Pain location: Right lower back Pain description: Ache Aggravating factors: Standing, walking, moving around Relieving factors: Medication, ice, rest  PRECAUTIONS: None  PATIENT GOALS: Pain relief   OBJECTIVE:  Note: Objective measures were completed at Evaluation unless otherwise noted. PATIENT SURVEYS:  PSFS: 3.66 Walking: 5 Moving around with speed: 3 Standing for a while: 3  06/03/2024: PSFS: 5 Walking: 5 Moving around with speed: 5 Standing  for a while: 5   SENSATION: WFL  MUSCLE LENGTH: Hamstring grossly WFL  POSTURE:   Rounded shoulder posture  PALPATION: Non-tender to palpation  Lumbar CPAs grossly WFL and non-painful  LUMBAR ROM:   AROM eval  Flexion WFL  Extension 75%  Right lateral flexion WFL  Left lateral flexion WFL  Right rotation WFL  Left rotation WFL   (Blank rows = not tested)  LOWER EXTREMITY ROM:      Hip PROM grossly WFL and non-painful  LOWER EXTREMITY MMT:    MMT Right eval Left eval Rt / Lt 04/21/2024 Rt / Lt 04/28/2024 Rt / Lt 05/04/2024 Rt / Lt 06/03/2024   Hip flexion 4- 4-      Hip extension 3 3+    3+ / 3+  Hip abduction 3- 3 3- / 3 3 / 3+ 3+ / 3+ 3+ / 3+  Hip adduction        Hip internal rotation        Hip external rotation        Knee flexion 5 5      Knee extension 5 5      Ankle dorsiflexion        Ankle plantarflexion        Ankle inversion        Ankle eversion         (Blank rows = not tested)  LUMBAR SPECIAL TESTS:  Slump and SLR negative for radicular symptoms  FUNCTIONAL TESTS:  5 times sit to stand: 16 seconds  04/28/2024: 14 seconds  06/03/2024: 13 seconds  GAIT: Assistive device utilized: None Level of assistance: Complete Independence Comments: Shuffling gait pattern   TREATMENT [facility] Adult PT Treatment:                                                DATE: 06/15/2024 Recumbent bike L5 x 5 min to improve endurance and workload capacity Sit to stand hold 8# at chest 3 x 12 Forward 6 step-up 3 x 12 each LAQ with 5# 3 x 15 each Tandem stance 3 x 30 sec each  PATIENT EDUCATION:  Education details: HEP update Person educated: Patient Education method: Explanation, Demonstration, Tactile cues, Verbal cues, Handout Education comprehension: verbalized understanding, returned demonstration, verbal cues required, tactile cues required, and needs further education  HOME EXERCISE PROGRAM: Access Code: [email or web address]   ASSESSMENT: CLINICAL IMPRESSION: Patient tolerated therapy well with no adverse effects. Therapy focused primarily on progressing her LE strengthening and incorporated balance training with good tolerance. She does report overall improvement in her symptoms and seems to be progressing well with her strengthening exercises. She did require occasional use of UE for support with her balance exercise. Updated her HEP this visit to incorporate tandem stance. Patient would benefit from continued skilled PT to progress mobility and strength in order to reduce pain and maximize functional ability.   Eval: Patient  is a 78 y.o. female who was seen today for physical therapy evaluation and treatment for chronic right leg pain that does seem to be related to the lumbar spine. She has mild limitations with lumbar spine mobility and gross hip and core strength deficits, but was not reporting much pain or reproduction of her right leg symptoms with testing. She reported majority of pain this visit was located to the right gluteal region but unable  to reproduce any pain with palpation of the lumbar spine or right hip/leg. Exercises provided to address strength deficits.  OBJECTIVE IMPAIRMENTS: Abnormal gait, decreased activity tolerance, decreased balance, decreased ROM, decreased strength, and pain.   ACTIVITY LIMITATIONS: carrying, lifting, standing, and locomotion level  PARTICIPATION LIMITATIONS: meal prep, cleaning, shopping, and community activity  PERSONAL FACTORS: Fitness, Past/current experiences, and Time since onset of injury/illness/exacerbation are also affecting patient's functional outcome.    GOALS: Goals reviewed with patient? Yes  SHORT TERM GOALS: Target date: 04/26/2024  Patient will be I with initial HEP in order to progress with therapy. Baseline: HEP provided at eval 04/28/2024: independent with initial HEP Goal status: MET  2.  Patient will report right leg pain at worst with activity </= 5/10 in order to reduce functional limitations Baseline: 7/10 04/28/2024: 6/10 05/04/2024: report 4/10 Goal status: MET  LONG TERM GOALS: Target date: 07/15/2024  Patient will be I with final HEP to maintain progress from PT. Baseline: HEP provided at eval 06/03/2024: progressing Goal status: ONGOING  2.  Patient will report PSFS >/= 6 in order to indicate improvement in her functional status Baseline: 3.66 06/03/2024: 5 Goal status: ONGOING  3.  Patient will demonstrate hip strength >/= 4/5 MMT in order to improve her walking and activity tolerance Baseline: see limitations above 06/03/2024:  see limitations above Goal status: ONGOING  4.  Patient will perform 5xSTS </= 12 seconds in order to indicate improvement in strength, mobility, and reduced fall risk Baseline: 16 seconds 04/28/2024: 14 seconds 06/03/2024: 13 seconds Goal status: ONGOING   PLAN: PT FREQUENCY: 1x/week  PT DURATION: 6 weeks  PLANNED INTERVENTIONS: 97164- PT Re-evaluation, 97750- Physical Performance Testing, 97110-Therapeutic exercises, 97530- Therapeutic activity, 97112- Neuromuscular re-education, 97535- Self Care, [id number]- Manual therapy, 97116- Gait training, [phone] (1-2 muscles), 20561 (3+ muscles)- Dry Needling, Patient/Family education, Balance training, Stair training, Joint mobilization, Joint manipulation, Spinal manipulation, Spinal mobilization, Cryotherapy, and Moist heat.  PLAN FOR NEXT SESSION: Review HEP and progress PRN, continue progression for core, hip, and LE strengthening, gait training, balance training    Elaine Daring, PT, DPT, LAT, ATC 06/15/24  3:59 PM Phone: [phone] Fax: [phone]

## 2024-06-15 NOTE — Patient Instructions (Signed)
 Access Code: [email or web address] URL: [email or web address] Date: 06/15/2024 Prepared by: Elaine Daring  Exercises - Sidelying Hip Abduction  - 1 x daily - 3 sets - 10 reps - Bridge  - 1 x daily - 3 sets - 10 reps - 5 seconds hold - Active Straight Leg Raise with Quad Set  - 1 x daily - 3 sets - 10 reps - Sit to Stand Without Arm Support  - 1 x daily - 3 sets - 10 reps - Standing Gastroc Stretch at Counter  - 1 x daily - 3 reps - 30 seconds hold - Standing Tandem Balance with Counter Support  - 1 x daily - 3 reps - 30 seconds hold

## 2024-06-19 ENCOUNTER — Other Ambulatory Visit: Payer: Self-pay | Admitting: Family Medicine

## 2024-06-22 NOTE — Telephone Encounter (Signed)
 Gave pt a call to follow up on mail out application,pt said she was going to mail back ,but have not received,left a HIPAA VM.

## 2024-06-23 ENCOUNTER — Encounter: Admitting: Physical Therapy

## 2024-06-23 NOTE — Telephone Encounter (Signed)
 Pt return call explain she has not had a chance to mail out th application BMS Eliquis ,she has been having issues with taking care of nephew,but will tried to mail back in a couple of days along proof of income and pharmacy out of pocket spend.

## 2024-06-29 ENCOUNTER — Encounter: Admitting: Physical Therapy

## 2024-06-30 ENCOUNTER — Ambulatory Visit: Admitting: Family Medicine

## 2024-07-02 ENCOUNTER — Ambulatory Visit: Admitting: Family Medicine

## 2024-07-02 NOTE — Telephone Encounter (Signed)
 Gave pt a call to follow up on PAP BMS Eliquis  pt has not return,pt did not answer,at this time I will let pt call back at her earliest convenience.

## 2024-07-05 ENCOUNTER — Encounter: Payer: Self-pay | Admitting: Family Medicine

## 2024-07-05 ENCOUNTER — Ambulatory Visit: Admitting: Family Medicine

## 2024-07-05 ENCOUNTER — Ambulatory Visit: Payer: Self-pay | Admitting: Family Medicine

## 2024-07-05 VITALS — BP 130/78 | HR 56 | Temp 98.3°F | Wt 164.6 lb

## 2024-07-05 DIAGNOSIS — E039 Hypothyroidism, unspecified: Secondary | ICD-10-CM | POA: Diagnosis not present

## 2024-07-05 DIAGNOSIS — L659 Nonscarring hair loss, unspecified: Secondary | ICD-10-CM | POA: Diagnosis not present

## 2024-07-05 DIAGNOSIS — R079 Chest pain, unspecified: Secondary | ICD-10-CM

## 2024-07-05 DIAGNOSIS — H93A2 Pulsatile tinnitus, left ear: Secondary | ICD-10-CM

## 2024-07-05 LAB — TSH: TSH: 1.66 u[IU]/mL (ref 0.35–5.50)

## 2024-07-05 LAB — T3, FREE: T3, Free: 3.3 pg/mL (ref 2.3–4.2)

## 2024-07-05 LAB — T4, FREE: Free T4: 1.04 ng/dL (ref 0.60–1.60)

## 2024-07-05 NOTE — Progress Notes (Signed)
    Subjective:    Patient ID: Anne [name], female    DOB: Apr 27, 1946, 78 y.o.   MRN: [id number]  HPI Here to follow up on an ED visit on 06-07-24 for chest pains. That day her exam and labs were normal. Her EKG and CXR were normal. They concluded the pain was not cardiac in nature, but they did put in a referral to see Cardiology. Today she says the chest pains have resolved. She still hears her heartbeat in the left ear and occasionally has pain in the left ear. He had referred her to ENT, but she has not heard from them yet. Finally she asks about hair loss. About a month ago she began to lose some hairs from the top of her head.    Review of Systems  Constitutional: Negative.   HENT:  Positive for ear pain and tinnitus. Negative for sinus pain.   Respiratory: Negative.    Cardiovascular:  Positive for chest pain. Negative for palpitations and leg swelling.       Objective:   Physical Exam Constitutional:      Appearance: Normal appearance.  HENT:     Right Ear: Tympanic membrane, ear canal and external ear normal.     Left Ear: Tympanic membrane, ear canal and external ear normal.     Nose: Nose normal.     Mouth/Throat:     Pharynx: Oropharynx is clear.  Eyes:     Conjunctiva/sclera: Conjunctivae normal.  Cardiovascular:     Rate and Rhythm: Normal rate and regular rhythm.     Pulses: Normal pulses.     Heart sounds: Normal heart sounds.  Pulmonary:     Effort: Pulmonary effort is normal.     Breath sounds: Normal breath sounds.  Lymphadenopathy:     Cervical: No cervical adenopathy.  Neurological:     Mental Status: She is alert.           Assessment & Plan:  She has pulsatile tinnitus in the left ear, and I reminded her that I had referred her to ENT. She will contact them to make an appointment. Her recent chest pains did not seem to be due to heart issues, but I still think she she should see the Cardiologist. I advised her to make an appt with them. As for  the hair loss, we will check thyroid  levels today. We spent a total of ( 35  ) minutes reviewing records and discussing these issues.  Garnette Olmsted, MD

## 2024-07-05 NOTE — Addendum Note (Signed)
 Addended by: JOHNNY SENIOR A on: 07/05/2024 02:55 PM   Modules accepted: Level of Service

## 2024-07-05 NOTE — Progress Notes (Signed)
u

## 2024-07-07 ENCOUNTER — Other Ambulatory Visit: Payer: Self-pay

## 2024-07-07 ENCOUNTER — Ambulatory Visit: Admitting: Physical Therapy

## 2024-07-07 ENCOUNTER — Encounter: Payer: Self-pay | Admitting: Physical Therapy

## 2024-07-07 DIAGNOSIS — M79604 Pain in right leg: Secondary | ICD-10-CM | POA: Diagnosis not present

## 2024-07-07 DIAGNOSIS — M6281 Muscle weakness (generalized): Secondary | ICD-10-CM

## 2024-07-07 DIAGNOSIS — M5459 Other low back pain: Secondary | ICD-10-CM | POA: Diagnosis not present

## 2024-07-07 NOTE — Therapy (Signed)
 OUTPATIENT PHYSICAL THERAPY TREATMENT   Patient Name: Anne [name] MRN: [id number] DOB:09/19/1946, 78 y.o., female Today's Date: 07/07/2024   END OF SESSION:  PT End of Session - 07/07/24 1429     Visit Number 8    Number of Visits 12    Date for Recertification  07/15/24    Authorization Type [employer] MCR    Authorization Time Period 06/03/2024 - 09/01/2024    Authorization - Visit Number 3    Authorization - Number of Visits 8    PT Start Time 1430    PT Stop Time 1510    PT Time Calculation (min) 40 min    Activity Tolerance Patient tolerated treatment well    Behavior During Therapy [facility] for tasks assessed/performed                 Past Medical History:  Diagnosis Date   GERD (gastroesophageal reflux disease) 08/29/2021   Hyperlipidemia    Hypertension    Osteopenia    per DEXA on 07-19-09   Stroke (HCC) 09/14/2007   has mild left leg weakness, saw Dr. Chalice   History reviewed. No pertinent surgical history. Patient Active Problem List   Diagnosis Date Noted   Pulsatile tinnitus of left ear 07/05/2024   Constipation 05/13/2024   Low back pain 03/24/2024   Acute pulmonary embolism (HCC) 02/12/2024   Hypokalemia 02/12/2024   Vertigo 12/01/2023   Bite, insect 04/30/2023   COVID-19 virus infection 05/27/2022   GERD (gastroesophageal reflux disease) 08/29/2021   Bradycardia, unspecified 01/02/2018   Mallet deformity of fourth finger, right 02/02/2014   Allergic rhinitis 05/08/2010   DIZZINESS 02/01/2010   Lipoma 08/11/2009   Disorder of bone and cartilage 08/11/2009   SUPRAVENTRICULAR PREMATURE BEATS 07/28/2008   FATIGUE 06/16/2008   MYOSITIS 05/12/2008   Hyperlipidemia 11/24/2007   Essential hypertension 11/24/2007    PCP: Johnny Garnette LABOR, MD  REFERRING PROVIDER: Joane Artist RAMAN, MD  REFERRING DIAG: Lumbar radiculopathy  Rationale for Evaluation and Treatment: Rehabilitation  THERAPY DIAG:  Pain in right leg  Other low back  pain  Muscle weakness (generalized)  ONSET DATE: 12/26/2023   SUBJECTIVE:         SUBJECTIVE STATEMENT: Patient reports she missed last visit because she was having right leg pain in the calf and knee region for about 3 days. She denies any reason the leg pain worsened. She states today she is feeling ok, is not having pain in the leg but is having some discomfort in the right lower back and hip region. She also reports her energy level is low today. She has been trying to walk more but has been inconsistent with her exercises at home.    Eval: Patient reports about 3 months ago she opened her car door and in an effort not to hit herself she stepped back oddly on the right leg and spun around. From that point on her right leg has been bothering her. Pain can travel from the right lower back all the way down to the calf and back up. States it is hard for her to describe her pain, it is not tender but still has pain that can be excruciating. Pain has been severely limiting her activity level. She has begun taking her medication more regularly the past few days and her pain has improved since that time.  PERTINENT HISTORY:  See PMH above  PAIN:  Are you having pain? Yes:  NPRS scale: 4/10 currently Pain location: Right lower  back Pain description: Ache Aggravating factors: Standing, walking, moving around Relieving factors: Medication, ice, rest  PRECAUTIONS: None  PATIENT GOALS: Pain relief   OBJECTIVE:  Note: Objective measures were completed at Evaluation unless otherwise noted. PATIENT SURVEYS:  PSFS: 3.66 Walking: 5 Moving around with speed: 3 Standing for a while: 3  06/03/2024: PSFS: 5 Walking: 5 Moving around with speed: 5 Standing for a while: 5   SENSATION: WFL  MUSCLE LENGTH: Hamstring grossly WFL  POSTURE:   Rounded shoulder posture  PALPATION: Non-tender to palpation  Lumbar CPAs grossly WFL and non-painful  LUMBAR ROM:   AROM eval  Flexion WFL   Extension 75%  Right lateral flexion WFL  Left lateral flexion WFL  Right rotation WFL  Left rotation WFL   (Blank rows = not tested)  LOWER EXTREMITY ROM:      Hip PROM grossly WFL and non-painful  LOWER EXTREMITY MMT:    MMT Right eval Left eval Rt / Lt 04/21/2024 Rt / Lt 04/28/2024 Rt / Lt 05/04/2024 Rt / Lt 06/03/2024  Hip flexion 4- 4-      Hip extension 3 3+    3+ / 3+  Hip abduction 3- 3 3- / 3 3 / 3+ 3+ / 3+ 3+ / 3+  Hip adduction        Hip internal rotation        Hip external rotation        Knee flexion 5 5      Knee extension 5 5      Ankle dorsiflexion        Ankle plantarflexion        Ankle inversion        Ankle eversion         (Blank rows = not tested)  LUMBAR SPECIAL TESTS:  Slump and SLR negative for radicular symptoms  FUNCTIONAL TESTS:  5 times sit to stand: 16 seconds  04/28/2024: 14 seconds  06/03/2024: 13 seconds  07/07/2024: 12 seconds  GAIT: Assistive device utilized: None Level of assistance: Complete Independence Comments: Shuffling gait pattern   TREATMENT [facility] Adult PT Treatment:                                                DATE: 07/07/2024 Recumbent bike L5 x 6 min to improve endurance and workload capacity Sit to stand 3 x 12 LAQ with 5# 3 x 15 each Standing hip abduction with 5# 3 x 10 each Standing heel raises 3 x 15 Tandem stance 3 x 30 sec each Bridge 2 x 10 SLR 2 x 10  PATIENT EDUCATION:  Education details: HEP Person educated: Patient Education method: Programmer, multimedia, Facilities manager, Actor cues, Verbal cues Education comprehension: verbalized understanding, returned demonstration, verbal cues required, tactile cues required, and needs further education  HOME EXERCISE PROGRAM: Access Code: [email or web address]   ASSESSMENT: CLINICAL IMPRESSION: Patient tolerated therapy well with no adverse effects. Therapy focused primarily on progressing her LE strengthening and balance training with good tolerance. She did require more rest  breaks this visit due to her energy level being low, but was able to complete all prescribed exercises. She did exhibit improvement in her 5xSTS this visit. No changes made to her HEP. Patient would benefit from continued skilled PT to progress mobility and strength in order to reduce pain and maximize functional ability.   Eval:  Patient is a 78 y.o. female who was seen today for physical therapy evaluation and treatment for chronic right leg pain that does seem to be related to the lumbar spine. She has mild limitations with lumbar spine mobility and gross hip and core strength deficits, but was not reporting much pain or reproduction of her right leg symptoms with testing. She reported majority of pain this visit was located to the right gluteal region but unable to reproduce any pain with palpation of the lumbar spine or right hip/leg. Exercises provided to address strength deficits.  OBJECTIVE IMPAIRMENTS: Abnormal gait, decreased activity tolerance, decreased balance, decreased ROM, decreased strength, and pain.   ACTIVITY LIMITATIONS: carrying, lifting, standing, and locomotion level  PARTICIPATION LIMITATIONS: meal prep, cleaning, shopping, and community activity  PERSONAL FACTORS: Fitness, Past/current experiences, and Time since onset of injury/illness/exacerbation are also affecting patient's functional outcome.    GOALS: Goals reviewed with patient? Yes  SHORT TERM GOALS: Target date: 04/26/2024  Patient will be I with initial HEP in order to progress with therapy. Baseline: HEP provided at eval 04/28/2024: independent with initial HEP Goal status: MET  2.  Patient will report right leg pain at worst with activity </= 5/10 in order to reduce functional limitations Baseline: 7/10 04/28/2024: 6/10 05/04/2024: report 4/10 Goal status: MET  LONG TERM GOALS: Target date: 07/15/2024  Patient will be I with final HEP to maintain progress from PT. Baseline: HEP provided at  eval 06/03/2024: progressing Goal status: ONGOING  2.  Patient will report PSFS >/= 6 in order to indicate improvement in her functional status Baseline: 3.66 06/03/2024: 5 Goal status: ONGOING  3.  Patient will demonstrate hip strength >/= 4/5 MMT in order to improve her walking and activity tolerance Baseline: see limitations above 06/03/2024: see limitations above Goal status: ONGOING  4.  Patient will perform 5xSTS </= 12 seconds in order to indicate improvement in strength, mobility, and reduced fall risk Baseline: 16 seconds 04/28/2024: 14 seconds 06/03/2024: 13 seconds 07/07/2024: 12 seconds Goal status: MET   PLAN: PT FREQUENCY: 1x/week  PT DURATION: 6 weeks  PLANNED INTERVENTIONS: 97164- PT Re-evaluation, 97750- Physical Performance Testing, 97110-Therapeutic exercises, 97530- Therapeutic activity, 97112- Neuromuscular re-education, 97535- Self Care, 02859- Manual therapy, 97116- Gait training, [phone] (1-2 muscles), 20561 (3+ muscles)- Dry Needling, Patient/Family education, Balance training, Stair training, Joint mobilization, Joint manipulation, Spinal manipulation, Spinal mobilization, Cryotherapy, and Moist heat.  PLAN FOR NEXT SESSION: Review HEP and progress PRN, continue progression for core, hip, and LE strengthening, gait training, balance training    Elaine Daring, PT, DPT, LAT, ATC 07/07/24  3:13 PM Phone: [phone] Fax: [phone]

## 2024-07-14 ENCOUNTER — Encounter: Admitting: Physical Therapy

## 2024-07-19 ENCOUNTER — Ambulatory Visit: Admitting: Physical Therapy

## 2024-07-19 ENCOUNTER — Other Ambulatory Visit: Payer: Self-pay

## 2024-07-19 DIAGNOSIS — M5459 Other low back pain: Secondary | ICD-10-CM

## 2024-07-19 DIAGNOSIS — M79604 Pain in right leg: Secondary | ICD-10-CM | POA: Diagnosis not present

## 2024-07-19 DIAGNOSIS — M6281 Muscle weakness (generalized): Secondary | ICD-10-CM | POA: Diagnosis not present

## 2024-07-19 NOTE — Therapy (Unsigned)
 OUTPATIENT PHYSICAL THERAPY TREATMENT   Patient Name: Anne [name] MRN: [id number] DOB:Jan 05, 1946, 78 y.o., female Today's Date: 07/20/2024   END OF SESSION:  PT End of Session - 07/20/24 0832     Visit Number 9    Number of Visits 13    Date for Recertification  08/16/24    Authorization Type [employer] MCR    Authorization Time Period 06/03/2024 - 09/01/2024    Authorization - Visit Number 4    Authorization - Number of Visits 8    PT Start Time 1515    PT Stop Time 1558    PT Time Calculation (min) 43 min    Activity Tolerance Patient tolerated treatment well    Behavior During Therapy [facility] for tasks assessed/performed                  Past Medical History:  Diagnosis Date   GERD (gastroesophageal reflux disease) 08/29/2021   Hyperlipidemia    Hypertension    Osteopenia    per DEXA on 07-19-09   Stroke (HCC) 09/14/2007   has mild left leg weakness, saw Dr. Chalice   History reviewed. No pertinent surgical history. Patient Active Problem List   Diagnosis Date Noted   Pulsatile tinnitus of left ear 07/05/2024   Constipation 05/13/2024   Low back pain 03/24/2024   Acute pulmonary embolism (HCC) 02/12/2024   Hypokalemia 02/12/2024   Vertigo 12/01/2023   Bite, insect 04/30/2023   COVID-19 virus infection 05/27/2022   GERD (gastroesophageal reflux disease) 08/29/2021   Bradycardia, unspecified 01/02/2018   Mallet deformity of fourth finger, right 02/02/2014   Allergic rhinitis 05/08/2010   DIZZINESS 02/01/2010   Lipoma 08/11/2009   Disorder of bone and cartilage 08/11/2009   SUPRAVENTRICULAR PREMATURE BEATS 07/28/2008   FATIGUE 06/16/2008   MYOSITIS 05/12/2008   Hyperlipidemia 11/24/2007   Essential hypertension 11/24/2007    PCP: Johnny Garnette LABOR, MD  REFERRING PROVIDER: Joane Artist RAMAN, MD  REFERRING DIAG: Lumbar radiculopathy  Rationale for Evaluation and Treatment: Rehabilitation  THERAPY DIAG:  Pain in right leg  Other low back  pain  Muscle weakness (generalized)  ONSET DATE: 12/26/2023   SUBJECTIVE:         SUBJECTIVE STATEMENT: Patient reports she was having some more right leg pain for no reason, seems to start in the morning.   Eval: Patient reports about 3 months ago she opened her car door and in an effort not to hit herself she stepped back oddly on the right leg and spun around. From that point on her right leg has been bothering her. Pain can travel from the right lower back all the way down to the calf and back up. States it is hard for her to describe her pain, it is not tender but still has pain that can be excruciating. Pain has been severely limiting her activity level. She has begun taking her medication more regularly the past few days and her pain has improved since that time.  PERTINENT HISTORY:  See PMH above  PAIN:  Are you having pain? Yes:  NPRS scale: 4/10 currently Pain location: Right lower back Pain description: Ache Aggravating factors: Standing, walking, moving around Relieving factors: Medication, ice, rest  PRECAUTIONS: None  PATIENT GOALS: Pain relief   OBJECTIVE:  Note: Objective measures were completed at Evaluation unless otherwise noted. PATIENT SURVEYS:  PSFS: 3.66 Walking: 5 Moving around with speed: 3 Standing for a while: 3  06/03/2024: PSFS: 5 Walking: 5 Moving around with speed:  5 Standing for a while: 5   SENSATION: WFL  MUSCLE LENGTH: Hamstring grossly WFL  POSTURE:   Rounded shoulder posture  PALPATION: Non-tender to palpation  Lumbar CPAs grossly WFL and non-painful  LUMBAR ROM:   AROM eval  Flexion WFL  Extension 75%  Right lateral flexion WFL  Left lateral flexion WFL  Right rotation WFL  Left rotation WFL   (Blank rows = not tested)  LOWER EXTREMITY ROM:      Hip PROM grossly WFL and non-painful  LOWER EXTREMITY MMT:    MMT Right eval Left eval Rt / Lt 04/21/2024 Rt / Lt 04/28/2024 Rt / Lt 05/04/2024 Rt / Lt 06/03/2024 Rt  / Lt 07/19/2024  Hip flexion 4- 4-       Hip extension 3 3+    3+ / 3+ 3+ / 3+  Hip abduction 3- 3 3- / 3 3 / 3+ 3+ / 3+ 3+ / 3+ 3+ / 3+  Hip adduction         Hip internal rotation         Hip external rotation         Knee flexion 5 5       Knee extension 5 5       Ankle dorsiflexion         Ankle plantarflexion         Ankle inversion         Ankle eversion          (Blank rows = not tested)  LUMBAR SPECIAL TESTS:  Slump and SLR negative for radicular symptoms  FUNCTIONAL TESTS:  5 times sit to stand: 16 seconds  04/28/2024: 14 seconds  06/03/2024: 13 seconds  07/07/2024: 12 seconds  GAIT: Assistive device utilized: None Level of assistance: Complete Independence Comments: Shuffling gait pattern   TREATMENT [facility] Adult PT Treatment:                                                DATE: 07/19/2024 Recumbent bike L4 x 5 min to improve endurance and workload capacity Piriformis stretch 3 x 20 sec each LTR x 10 each Modified thomas stretch 3 x 30 seconds each Bridge 2 x 10 SLR 2 x 10 each Sidelying hip abduction 2 x 10 each Sit to stand 3 x 10 Standing hip extension 2 x 10 each  PATIENT EDUCATION:  Education details: POC extension, HEP Person educated: Patient Education method: Explanation, Demonstration, Tactile cues, Verbal cues Education comprehension: verbalized understanding, returned demonstration, verbal cues required, tactile cues required, and needs further education  HOME EXERCISE PROGRAM: Access Code: [email or web address]   ASSESSMENT: CLINICAL IMPRESSION: Patient tolerated therapy well with no adverse effects. Therapy continues to focus on progressing her LE strength and endurance. She reports continued limitations in her functional ability and walking, occasional right leg pain that also limits her, and she has not been consistent with her HEP. She exhibits deficits in her strength and functional capacity. No changes made to her HEP but she was highly encouraged to  improve consistency with HEP and increase activity level. Patient would benefit from continued skilled PT to progress mobility and strength in order to reduce pain and maximize functional ability.   Eval: Patient is a 78 y.o. female who was seen today for physical therapy evaluation and treatment for chronic right leg pain that  does seem to be related to the lumbar spine. She has mild limitations with lumbar spine mobility and gross hip and core strength deficits, but was not reporting much pain or reproduction of her right leg symptoms with testing. She reported majority of pain this visit was located to the right gluteal region but unable to reproduce any pain with palpation of the lumbar spine or right hip/leg. Exercises provided to address strength deficits.  OBJECTIVE IMPAIRMENTS: Abnormal gait, decreased activity tolerance, decreased balance, decreased ROM, decreased strength, and pain.   ACTIVITY LIMITATIONS: carrying, lifting, standing, and locomotion level  PARTICIPATION LIMITATIONS: meal prep, cleaning, shopping, and community activity  PERSONAL FACTORS: Fitness, Past/current experiences, and Time since onset of injury/illness/exacerbation are also affecting patient's functional outcome.    GOALS: Goals reviewed with patient? Yes  SHORT TERM GOALS: Target date: 04/26/2024  Patient will be I with initial HEP in order to progress with therapy. Baseline: HEP provided at eval 04/28/2024: independent with initial HEP Goal status: MET  2.  Patient will report right leg pain at worst with activity </= 5/10 in order to reduce functional limitations Baseline: 7/10 04/28/2024: 6/10 05/04/2024: report 4/10 Goal status: MET  LONG TERM GOALS: Target date: 08/16/2024  Patient will be I with final HEP to maintain progress from PT. Baseline: HEP provided at eval 06/03/2024: progressing 07/19/2024: progressing Goal status: ONGOING  2.  Patient will report PSFS >/= 6 in order to indicate  improvement in her functional status Baseline: 3.66 06/03/2024: 5 07/19/2024: not assessed Goal status: ONGOING  3.  Patient will demonstrate hip strength >/= 4/5 MMT in order to improve her walking and activity tolerance Baseline: see limitations above 06/03/2024: see limitations above 07/19/2024: see limitations above Goal status: ONGOING  4.  Patient will perform 5xSTS </= 12 seconds in order to indicate improvement in strength, mobility, and reduced fall risk Baseline: 16 seconds 04/28/2024: 14 seconds 06/03/2024: 13 seconds 07/07/2024: 12 seconds Goal status: MET   PLAN: PT FREQUENCY: 1x/week  PT DURATION: 4 weeks  PLANNED INTERVENTIONS: 97164- PT Re-evaluation, 97750- Physical Performance Testing, 97110-Therapeutic exercises, 97530- Therapeutic activity, 97112- Neuromuscular re-education, 97535- Self Care, 02859- Manual therapy, 97116- Gait training, [phone] (1-2 muscles), 20561 (3+ muscles)- Dry Needling, Patient/Family education, Balance training, Stair training, Joint mobilization, Joint manipulation, Spinal manipulation, Spinal mobilization, Cryotherapy, and Moist heat.  PLAN FOR NEXT SESSION: Review HEP and progress PRN, continue progression for core, hip, and LE strengthening, gait training, balance training    Elaine Daring, PT, DPT, LAT, ATC 07/20/24  10:48 AM Phone: [phone] Fax: [phone]

## 2024-07-20 ENCOUNTER — Other Ambulatory Visit: Payer: Self-pay | Admitting: Family Medicine

## 2024-07-20 ENCOUNTER — Encounter: Payer: Self-pay | Admitting: Physical Therapy

## 2024-07-20 NOTE — Addendum Note (Signed)
 Addended by: DELORES ELAINE SQUIBB on: 07/20/2024 10:59 AM   Modules accepted: Orders

## 2024-07-22 ENCOUNTER — Ambulatory Visit: Payer: Self-pay

## 2024-07-22 NOTE — Telephone Encounter (Signed)
 FYI Only or Action Required?: Action required by provider: request for appointment.  Patient was last seen in primary care on 07/05/2024 by Anne [name] LABOR, MD.  Called Nurse Triage reporting Cough.  Symptoms began several days ago.  Interventions attempted: Nothing.  Symptoms are: gradually worsening. Cough getting worse, runny nose.   Triage Disposition: See Physician Within 24 Hours  Patient/caregiver understands and will follow disposition?: Yes     Copied from CRM #[id number]. Topic: Clinical - Red Word Triage >> Jul 22, 2024  9:05 AM Larissa RAMAN wrote: [facility] that prompted transfer to Nurse Triage: cough with yellow mucous x 4 days, nasal drainage Answer Assessment - Initial Assessment Questions 1. ONSET: When did the cough begin?      4 days 2. SEVERITY: How bad is the cough today?      severe 3. SPUTUM: Describe the color of your sputum (e.g., none, dry cough; clear, white, yellow, green)     creme 4. HEMOPTYSIS: Are you coughing up any blood? If Yes, ask: How much? (e.g., flecks, streaks, tablespoons, etc.)     no 5. DIFFICULTY BREATHING: Are you having difficulty breathing? If Yes, ask: How bad is it? (e.g., mild, moderate, severe)      no 6. FEVER: Do you have a fever? If Yes, ask: What is your temperature, how was it measured, and when did it start?     no 7. CARDIAC HISTORY: Do you have any history of heart disease? (e.g., heart attack, congestive heart failure)      yes 8. LUNG HISTORY: Do you have any history of lung disease?  (e.g., pulmonary embolus, asthma, emphysema)     no 9. PE RISK FACTORS: Do you have a history of blood clots? (or: recent major surgery, recent prolonged travel, bedridden)     no 10. OTHER SYMPTOMS: Do you have any other symptoms? (e.g., runny nose, wheezing, chest pain)       Runny nose, wheezing 11. PREGNANCY: Is there any chance you are pregnant? When was your last menstrual period?       no 12. TRAVEL:  Have you traveled out of the country in the last month? (e.g., travel history, exposures)       no  Protocols used: Cough - Acute Productive-A-AH

## 2024-07-23 ENCOUNTER — Encounter: Payer: Self-pay | Admitting: Family Medicine

## 2024-07-23 ENCOUNTER — Ambulatory Visit: Admitting: Family Medicine

## 2024-07-23 VITALS — BP 160/76 | HR 60 | Temp 98.0°F | Wt 157.2 lb

## 2024-07-23 DIAGNOSIS — J069 Acute upper respiratory infection, unspecified: Secondary | ICD-10-CM | POA: Diagnosis not present

## 2024-07-23 MED ORDER — BENZONATATE 200 MG PO CAPS: 200.0000 mg | ORAL_CAPSULE | Freq: Three times a day (TID) | ORAL | 0 refills | Status: AC | PRN

## 2024-07-23 NOTE — Progress Notes (Signed)
 Established Patient Office Visit  Subjective   Patient ID: Anne [name], female    DOB: Aug 15, 1946  Age: 78 y.o. MRN: [id number]  Chief Complaint  Patient presents with   Cough    HPI   [name] is seen today as a work in with cough for approximately 1 week.  Productive of yellow sputum.  She is taking over-the-counter Coricidin.  She did home COVID test which came back negative.  No reported fever.  She does feel some better today.  She states her cough has been somewhat less productive past day or 2 and somewhat lighter sputum color.  Slept better last night.  Has had some mild nasal congestion.  Initial mild sore throat which is better now.  No known sick contacts.  She has hypertension treated with multidrug regimen including amlodipine , HCTZ, and losartan .  Blood pressure up today but she states she has forgotten some doses of her blood pressure medication recently.  She has known aortic stenosis murmur but no recent dizziness or shortness of breath with day-to-day activities  Past Medical History:  Diagnosis Date   GERD (gastroesophageal reflux disease) 08/29/2021   Hyperlipidemia    Hypertension    Osteopenia    per DEXA on 07-19-09   Stroke (HCC) 09/14/2007   has mild left leg weakness, saw Dr. Chalice   History reviewed. No pertinent surgical history.  reports that she has never smoked. She has never used smokeless tobacco. She reports that she does not currently use alcohol. She reports that she does not use drugs. Family history is unknown by patient. Allergies  Allergen Reactions   Iodine Itching and Other (See Comments)    Lump in my tongue when I eat seafood.   Latex Itching and Other (See Comments)    GLOVES   Shellfish Allergy Swelling    Review of Systems  Constitutional:  Negative for chills and fever.  Respiratory:  Positive for cough and sputum production. Negative for hemoptysis and wheezing.   Cardiovascular:  Negative for chest pain.       Objective:     BP (!) 160/76   Pulse 60   Temp 98 F (36.7 C) (Oral)   Wt 157 lb 3.2 oz (71.3 kg)   SpO2 97%   BMI 26.16 kg/m  BP Readings from Last 3 Encounters:  07/23/24 (!) 160/76  07/05/24 130/78  06/07/24 (!) 149/68   Wt Readings from Last 3 Encounters:  07/23/24 157 lb 3.2 oz (71.3 kg)  07/05/24 164 lb 9.6 oz (74.7 kg)  05/13/24 163 lb (73.9 kg)      Physical Exam Vitals reviewed.  Constitutional:      General: She is not in acute distress.    Appearance: She is not ill-appearing.  HENT:     Right Ear: Tympanic membrane normal.     Left Ear: Tympanic membrane normal.     Mouth/Throat:     Mouth: Mucous membranes are moist.     Pharynx: Oropharynx is clear. No posterior oropharyngeal erythema.  Cardiovascular:     Rate and Rhythm: Normal rate.     Heart sounds: Murmur heard.     Comments: 3/6 systolic ejection murmur right upper sternal border Pulmonary:     Effort: Pulmonary effort is normal.     Breath sounds: Normal breath sounds. No wheezing or rales.  Musculoskeletal:     Cervical back: Neck supple.  Lymphadenopathy:     Cervical: No cervical adenopathy.  Neurological:  Mental Status: She is alert.      No results found for any visits on 07/23/24.    The ASCVD Risk score (Arnett DK, et al., 2019) failed to calculate for the following reasons:   Risk score cannot be calculated because patient has a medical history suggesting prior/existing ASCVD    Assessment & Plan:   Probable viral URI with cough.  Home COVID test negative.  Lung exam unremarkable.  Pulse ox 97% room air.  Patient somewhat better symptomatically compared with earlier in the week.  Tessalon  Perles 100 mg every 8 hours as needed for cough.  Consider over-the-counter Mucinex twice daily as needed.  Follow-up immediately for any fever, shortness of breath, or other concerns  Wolm Scarlet, MD

## 2024-07-23 NOTE — Telephone Encounter (Signed)
 Noted

## 2024-07-23 NOTE — Patient Instructions (Signed)
 Be sure to take your BP medications regularly  Drink plenty of fluids  Consider over the counter Mucinex twice daily  Follow up immediately for any fever.

## 2024-07-26 ENCOUNTER — Encounter: Admitting: Physical Therapy

## 2024-08-01 ENCOUNTER — Other Ambulatory Visit: Payer: Self-pay | Admitting: Family Medicine

## 2024-08-03 ENCOUNTER — Other Ambulatory Visit: Payer: Self-pay

## 2024-08-03 ENCOUNTER — Encounter: Payer: Self-pay | Admitting: Physical Therapy

## 2024-08-03 ENCOUNTER — Ambulatory Visit (INDEPENDENT_AMBULATORY_CARE_PROVIDER_SITE_OTHER): Admitting: Physical Therapy

## 2024-08-03 DIAGNOSIS — M6281 Muscle weakness (generalized): Secondary | ICD-10-CM

## 2024-08-03 DIAGNOSIS — M79604 Pain in right leg: Secondary | ICD-10-CM | POA: Diagnosis not present

## 2024-08-03 DIAGNOSIS — M5459 Other low back pain: Secondary | ICD-10-CM | POA: Diagnosis not present

## 2024-08-03 NOTE — Therapy (Signed)
 OUTPATIENT PHYSICAL THERAPY TREATMENT  Progress Note Reporting Period 03/29/2024 to 08/03/2024  See note below for Objective Data and Assessment of Progress/Goals.     Patient Name: Anne [name] MRN: [id number] DOB:12/06/1945, 78 y.o., female Today's Date: 08/03/2024   END OF SESSION:  PT End of Session - 08/03/24 1105     Visit Number 10    Number of Visits 13    Date for Recertification  08/16/24    Authorization Type [employer] MCR    Authorization Time Period 06/03/2024 - 09/01/2024    Authorization - Visit Number 5    Authorization - Number of Visits 8    Progress Note Due on Visit 20    PT Start Time 1100    PT Stop Time 1140    PT Time Calculation (min) 40 min    Activity Tolerance Patient tolerated treatment well    Behavior During Therapy [facility] for tasks assessed/performed                   Past Medical History:  Diagnosis Date   GERD (gastroesophageal reflux disease) 08/29/2021   Hyperlipidemia    Hypertension    Osteopenia    per DEXA on 07-19-09   Stroke (HCC) 09/14/2007   has mild left leg weakness, saw Dr. Chalice   History reviewed. No pertinent surgical history. Patient Active Problem List   Diagnosis Date Noted   Pulsatile tinnitus of left ear 07/05/2024   Constipation 05/13/2024   Low back pain 03/24/2024   Acute pulmonary embolism (HCC) 02/12/2024   Hypokalemia 02/12/2024   Vertigo 12/01/2023   Bite, insect 04/30/2023   COVID-19 virus infection 05/27/2022   GERD (gastroesophageal reflux disease) 08/29/2021   Bradycardia, unspecified 01/02/2018   Mallet deformity of fourth finger, right 02/02/2014   Allergic rhinitis 05/08/2010   DIZZINESS 02/01/2010   Lipoma 08/11/2009   Disorder of bone and cartilage 08/11/2009   SUPRAVENTRICULAR PREMATURE BEATS 07/28/2008   FATIGUE 06/16/2008   MYOSITIS 05/12/2008   Hyperlipidemia 11/24/2007   Essential hypertension 11/24/2007    PCP: Johnny Garnette LABOR, MD  REFERRING PROVIDER: Joane Artist RAMAN, MD  REFERRING DIAG: Lumbar radiculopathy  Rationale for Evaluation and Treatment: Rehabilitation  THERAPY DIAG:  Pain in right leg  Other low back pain  Muscle weakness (generalized)  ONSET DATE: 12/26/2023   SUBJECTIVE:         SUBJECTIVE STATEMENT: Patient reports she had to miss last appointment because she was not feeling well. She reports the right leg pain has improved but she feels her stamina has gotten a lot worse.  Eval: Patient reports about 3 months ago she opened her car door and in an effort not to hit herself she stepped back oddly on the right leg and spun around. From that point on her right leg has been bothering her. Pain can travel from the right lower back all the way down to the calf and back up. States it is hard for her to describe her pain, it is not tender but still has pain that can be excruciating. Pain has been severely limiting her activity level. She has begun taking her medication more regularly the past few days and her pain has improved since that time.  PERTINENT HISTORY:  See PMH above  PAIN:  Are you having pain? Yes:  NPRS scale: 3/10 currently Pain location: Right lower back Pain description: Ache Aggravating factors: Standing, walking, moving around Relieving factors: Medication, ice, rest  PRECAUTIONS: None  PATIENT GOALS:  Pain relief   OBJECTIVE:  Note: Objective measures were completed at Evaluation unless otherwise noted. PATIENT SURVEYS:  PSFS: 3.66 Walking: 5 Moving around with speed: 3 Standing for a while: 3  06/03/2024: PSFS: 5 Walking: 5 Moving around with speed: 5 Standing for a while: 5  08/03/2024: PSFS: 5.67 Walking: 6 Moving around with speed: 5 Standing for a while: 6   SENSATION: WFL  MUSCLE LENGTH: Hamstring grossly WFL  POSTURE:   Rounded shoulder posture  PALPATION: Non-tender to palpation  Lumbar CPAs grossly WFL and non-painful  LUMBAR ROM:   AROM eval  Flexion WFL  Extension 75%   Right lateral flexion WFL  Left lateral flexion WFL  Right rotation WFL  Left rotation WFL   (Blank rows = not tested)  LOWER EXTREMITY ROM:      Hip PROM grossly WFL and non-painful  LOWER EXTREMITY MMT:    MMT Right eval Left eval Rt / Lt 04/21/2024 Rt / Lt 04/28/2024 Rt / Lt 05/04/2024 Rt / Lt 06/03/2024 Rt / Lt 07/19/2024  Hip flexion 4- 4-       Hip extension 3 3+    3+ / 3+ 3+ / 3+  Hip abduction 3- 3 3- / 3 3 / 3+ 3+ / 3+ 3+ / 3+ 3+ / 3+  Hip adduction         Hip internal rotation         Hip external rotation         Knee flexion 5 5       Knee extension 5 5       Ankle dorsiflexion         Ankle plantarflexion         Ankle inversion         Ankle eversion          (Blank rows = not tested)  LUMBAR SPECIAL TESTS:  Slump and SLR negative for radicular symptoms  FUNCTIONAL TESTS:  5 times sit to stand: 16 seconds  04/28/2024: 14 seconds  06/03/2024: 13 seconds  07/07/2024: 12 seconds  GAIT: Assistive device utilized: None Level of assistance: Complete Independence Comments: Shuffling gait pattern   TREATMENT [facility] Adult PT Treatment:                                                DATE: 08/03/2024 Recumbent bike L4 x 6 min to improve endurance and workload capacity LTR x 10 each Bridge 2 x 10 SLR 2 x 10 each Sidelying hip abduction 3 x 10 each Sit to stand 3 x 10 Standing hip extension 2 x 10 each Tandem stance 3 x 30 sec each  Discussed need to exercise and perform aerobic activity such as walking in order to improve her stamina and energy level  PATIENT EDUCATION:  Education details: HEP Person educated: Patient Education method: Programmer, multimedia, Demonstration, Actor cues, Verbal cues Education comprehension: verbalized understanding, returned demonstration, verbal cues required, tactile cues required, and needs further education  HOME EXERCISE PROGRAM: Access Code: [email or web address]   ASSESSMENT: CLINICAL IMPRESSION: Patient tolerated therapy well with  no adverse effects. She does report a slight improvement in her functional status this visit but remains limited with her activity tolerance. Therapy focused primarily on continued strengthening for the hips and LE. She did report feeling like she had less energy today and did require  extended rest breaks. Incorporated some balance training with good tolerance. Spent time discussing patient's activity level and need to increase walking or other aerobic activity to improve stamina and ability to tolerate her daily activities. No changes made to her HEP. Patient would benefit from continued skilled PT to progress mobility and strength in order to reduce pain and maximize functional ability.   Eval: Patient is a 78 y.o. female who was seen today for physical therapy evaluation and treatment for chronic right leg pain that does seem to be related to the lumbar spine. She has mild limitations with lumbar spine mobility and gross hip and core strength deficits, but was not reporting much pain or reproduction of her right leg symptoms with testing. She reported majority of pain this visit was located to the right gluteal region but unable to reproduce any pain with palpation of the lumbar spine or right hip/leg. Exercises provided to address strength deficits.  OBJECTIVE IMPAIRMENTS: Abnormal gait, decreased activity tolerance, decreased balance, decreased ROM, decreased strength, and pain.   ACTIVITY LIMITATIONS: carrying, lifting, standing, and locomotion level  PARTICIPATION LIMITATIONS: meal prep, cleaning, shopping, and community activity  PERSONAL FACTORS: Fitness, Past/current experiences, and Time since onset of injury/illness/exacerbation are also affecting patient's functional outcome.    GOALS: Goals reviewed with patient? Yes  SHORT TERM GOALS: Target date: 04/26/2024  Patient will be I with initial HEP in order to progress with therapy. Baseline: HEP provided at eval 04/28/2024: independent  with initial HEP Goal status: MET  2.  Patient will report right leg pain at worst with activity </= 5/10 in order to reduce functional limitations Baseline: 7/10 04/28/2024: 6/10 05/04/2024: report 4/10 Goal status: MET  LONG TERM GOALS: Target date: 08/16/2024  Patient will be I with final HEP to maintain progress from PT. Baseline: HEP provided at eval 06/03/2024: progressing 07/19/2024: progressing Goal status: ONGOING  2.  Patient will report PSFS >/= 6 in order to indicate improvement in her functional status Baseline: 3.66 06/03/2024: 5 07/19/2024: not assessed 08/03/2024: 5.67 Goal status: ONGOING  3.  Patient will demonstrate hip strength >/= 4/5 MMT in order to improve her walking and activity tolerance Baseline: see limitations above 06/03/2024: see limitations above 07/19/2024: see limitations above Goal status: ONGOING  4.  Patient will perform 5xSTS </= 12 seconds in order to indicate improvement in strength, mobility, and reduced fall risk Baseline: 16 seconds 04/28/2024: 14 seconds 06/03/2024: 13 seconds 07/07/2024: 12 seconds Goal status: MET   PLAN: PT FREQUENCY: 1x/week  PT DURATION: 4 weeks  PLANNED INTERVENTIONS: 97164- PT Re-evaluation, 97750- Physical Performance Testing, 97110-Therapeutic exercises, 97530- Therapeutic activity, 97112- Neuromuscular re-education, 97535- Self Care, 02859- Manual therapy, 97116- Gait training, [phone] (1-2 muscles), 20561 (3+ muscles)- Dry Needling, Patient/Family education, Balance training, Stair training, Joint mobilization, Joint manipulation, Spinal manipulation, Spinal mobilization, Cryotherapy, and Moist heat.  PLAN FOR NEXT SESSION: Review HEP and progress PRN, continue progression for core, hip, and LE strengthening, gait training, balance training    Elaine Daring, PT, DPT, LAT, ATC 08/03/24  11:43 AM Phone: [phone] Fax: [phone]

## 2024-08-09 ENCOUNTER — Ambulatory Visit: Admitting: Physical Therapy

## 2024-08-09 ENCOUNTER — Other Ambulatory Visit: Payer: Self-pay

## 2024-08-09 ENCOUNTER — Encounter: Payer: Self-pay | Admitting: Physical Therapy

## 2024-08-09 DIAGNOSIS — M79604 Pain in right leg: Secondary | ICD-10-CM | POA: Diagnosis not present

## 2024-08-09 DIAGNOSIS — M6281 Muscle weakness (generalized): Secondary | ICD-10-CM | POA: Diagnosis not present

## 2024-08-09 DIAGNOSIS — M5459 Other low back pain: Secondary | ICD-10-CM | POA: Diagnosis not present

## 2024-08-09 NOTE — Therapy (Signed)
 OUTPATIENT PHYSICAL THERAPY TREATMENT    Patient Name: Anne [name] MRN: [id number] DOB:[birth year], 78 y.o., female Today's Date: 08/09/2024   END OF SESSION:  PT End of Session - 08/09/24 1107     Visit Number 11    Number of Visits 15    Date for Recertification  09/06/24    Authorization Type [employer] M[employer]    Authorization Time Period 06/03/2024 - 09/01/2024    Authorization - Visit Number 6    Authorization - Number of Visits 8    Progress Note Due on Visit 20    PT Start Time 1103    PT Stop Time 1145    PT Time Calculation (min) 42 min    Activity Tolerance Patient tolerated treatment well    Behavior During Therapy WFL for tasks assessed/performed                    Past Medical History:  Diagnosis Date   GERD (gastroesophageal reflux disease) 08/29/2021   Hyperlipidemia    Hypertension    Osteopenia    per DEXA on 07-19-09   Stroke (HCC) 09/14/2007   has mild left leg weakness, saw Dr. Chalice   History reviewed. No pertinent surgical history. Patient Active Problem List   Diagnosis Date Noted   Pulsatile tinnitus of left ear 07/05/2024   Constipation 05/13/2024   Low back pain 03/24/2024   Acute pulmonary embolism (HCC) 02/12/2024   Hypokalemia 02/12/2024   Vertigo 12/01/2023   Bite, insect 04/30/2023   COVID-19 virus infection 05/27/2022   GERD (gastroesophageal reflux disease) 08/29/2021   Bradycardia, unspecified 01/02/2018   Mallet deformity of fourth finger, right 02/02/2014   Allergic rhinitis 05/08/2010   DIZZINESS 02/01/2010   Lipoma 08/11/2009   Disorder of bone and cartilage 08/11/2009   SUPRAVENTRICULAR PREMATURE BEATS 07/28/2008   FATIGUE 06/16/2008   MYOSITIS 05/12/2008   Hyperlipidemia 11/24/2007   Essential hypertension 11/24/2007    PCP: Johnny Garnette LABOR, MD  REFERRING PROVIDER: Joane Artist RAMAN, MD  REFERRING DIAG: Lumbar radiculopathy  Rationale for Evaluation and Treatment: Rehabilitation  THERAPY DIAG:  Pain  in right leg  Other low back pain  Muscle weakness (generalized)  ONSET DATE: 12/26/2023   SUBJECTIVE:         SUBJECTIVE STATEMENT: Patient reports she has been doing a little exercise. She was getting pain in both legs and she feels the tightness of her socks and shoes were contributing to her pain. She reports her lower back has been feeling a little bit better.   Eval: Patient reports about 3 months ago she opened her car door and in an effort not to hit herself she stepped back oddly on the right leg and spun around. From that point on her right leg has been bothering her. Pain can travel from the right lower back all the way down to the calf and back up. States it is hard for her to describe her pain, it is not tender but still has pain that can be excruciating. Pain has been severely limiting her activity level. She has begun taking her medication more regularly the past few days and her pain has improved since that time.  PERTINENT HISTORY:  See PMH above  PAIN:  Are you having pain? Yes:  NPRS scale: 3/10 currently Pain location: Right lower back Pain description: Ache Aggravating factors: Standing, walking, moving around Relieving factors: Medication, ice, rest  PRECAUTIONS: None  PATIENT GOALS: Pain relief   OBJECTIVE:  Note:  Objective measures were completed at Evaluation unless otherwise noted. PATIENT SURVEYS:  PSFS: 3.66 Walking: 5 Moving around with speed: 3 Standing for a while: 3  06/03/2024: PSFS: 5 Walking: 5 Moving around with speed: 5 Standing for a while: 5  08/03/2024: PSFS: 5.67 Walking: 6 Moving around with speed: 5 Standing for a while: 6   SENSATION: WFL  MUSCLE LENGTH: Hamstring grossly WFL  POSTURE:   Rounded shoulder posture  PALPATION: Non-tender to palpation  Lumbar CPAs grossly WFL and non-painful  LUMBAR ROM:   AROM eval  Flexion WFL  Extension 75%  Right lateral flexion WFL  Left lateral flexion WFL  Right  rotation WFL  Left rotation WFL   (Blank rows = not tested)  LOWER EXTREMITY ROM:      Hip PROM grossly WFL and non-painful  LOWER EXTREMITY MMT:    MMT Right eval Left eval Rt / Lt 04/21/2024 Rt / Lt 04/28/2024 Rt / Lt 05/04/2024 Rt / Lt 06/03/2024 Rt / Lt 07/19/2024 Rt / Lt 08/09/2024  Hip flexion 4- 4-        Hip extension 3 3+    3+ / 3+ 3+ / 3+ 3+ / 3+  Hip abduction 3- 3 3- / 3 3 / 3+ 3+ / 3+ 3+ / 3+ 3+ / 3+ 3+ / 3+  Hip adduction          Hip internal rotation          Hip external rotation          Knee flexion 5 5        Knee extension 5 5        Ankle dorsiflexion          Ankle plantarflexion          Ankle inversion          Ankle eversion           (Blank rows = not tested)  LUMBAR SPECIAL TESTS:  Slump and SLR negative for radicular symptoms  FUNCTIONAL TESTS:  5 times sit to stand: 16 seconds  04/28/2024: 14 seconds  06/03/2024: 13 seconds  07/07/2024: 12 seconds  GAIT: Assistive device utilized: None Level of assistance: Complete Independence Comments: Shuffling gait pattern   TREATMENT [facility] Adult PT Treatment:                                                DATE: 08/09/2024 Recumbent bike L4 x 8 min to improve endurance and workload capacity Sit to stand 3 x 10 LAQ with 5# 3 x 10 Standing hip abduction and extension with yellow at knees 3 x 10 each Standing heel raises 3 x 10 Forward 4 step-up 3 x 10 each  PATIENT EDUCATION:  Education details: POC extension, HEP Person educated: Patient Education method: Explanation, Demonstration, Tactile cues, Verbal cues Education comprehension: verbalized understanding, returned demonstration, verbal cues required, tactile cues required, and needs further education  HOME EXERCISE PROGRAM: Access Code: [email or web address]   ASSESSMENT: CLINICAL IMPRESSION: Patient tolerated therapy well with no adverse effects. Therapy focused primarily on strengthening for the LE with good tolerance. She does report the right leg  does feel a little weaker than the left, but does feel better than it was. She does continue to exhibit gross strength deficits of both hips. She was able to tolerate more standing exercises  and increased resistance for strengthening this visit. Updated her HEP to progress standing exercises at home. Patient would benefit from continued skilled PT to progress mobility and strength in order to reduce pain and maximize functional ability, so will extend her PT POC for 4 more weeks.   Eval: Patient is a 78 y.o. female who was seen today for physical therapy evaluation and treatment for chronic right leg pain that does seem to be related to the lumbar spine. She has mild limitations with lumbar spine mobility and gross hip and core strength deficits, but was not reporting much pain or reproduction of her right leg symptoms with testing. She reported majority of pain this visit was located to the right gluteal region but unable to reproduce any pain with palpation of the lumbar spine or right hip/leg. Exercises provided to address strength deficits.  OBJECTIVE IMPAIRMENTS: Abnormal gait, decreased activity tolerance, decreased balance, decreased ROM, decreased strength, and pain.   ACTIVITY LIMITATIONS: carrying, lifting, standing, and locomotion level  PARTICIPATION LIMITATIONS: meal prep, cleaning, shopping, and community activity  PERSONAL FACTORS: Fitness, Past/current experiences, and Time since onset of injury/illness/exacerbation are also affecting patient's functional outcome.    GOALS: Goals reviewed with patient? Yes  SHORT TERM GOALS: Target date: 04/26/2024  Patient will be I with initial HEP in order to progress with therapy. Baseline: HEP provided at eval 04/28/2024: independent with initial HEP Goal status: MET  2.  Patient will report right leg pain at worst with activity </= 5/10 in order to reduce functional limitations Baseline: 7/10 04/28/2024: 6/10 05/04/2024: report 4/10 Goal  status: MET  LONG TERM GOALS: Target date: 09/06/2024  Patient will be I with final HEP to maintain progress from PT. Baseline: HEP provided at eval 06/03/2024: progressing 07/19/2024: progressing 08/09/2024: progressing Goal status: ONGOING  2.  Patient will report PSFS >/= 6 in order to indicate improvement in her functional status Baseline: 3.66 06/03/2024: 5 07/19/2024: not assessed 08/03/2024: 5.67 Goal status: ONGOING  3.  Patient will demonstrate hip strength >/= 4/5 MMT in order to improve her walking and activity tolerance Baseline: see limitations above 06/03/2024: see limitations above 07/19/2024: see limitations above 08/09/2024: see limitations above Goal status: ONGOING  4.  Patient will perform 5xSTS </= 12 seconds in order to indicate improvement in strength, mobility, and reduced fall risk Baseline: 16 seconds 04/28/2024: 14 seconds 06/03/2024: 13 seconds 07/07/2024: 12 seconds Goal status: MET   PLAN: PT FREQUENCY: 1x/week  PT DURATION: 4 weeks  PLANNED INTERVENTIONS: 97164- PT Re-evaluation, 97750- Physical Performance Testing, 97110-Therapeutic exercises, 97530- Therapeutic activity, 97112- Neuromuscular re-education, 97535- Self Care, 02859- Manual therapy, 97116- Gait training, [phone] (1-2 muscles), 20561 (3+ muscles)- Dry Needling, Patient/Family education, Balance training, Stair training, Joint mobilization, Joint manipulation, Spinal manipulation, Spinal mobilization, Cryotherapy, and Moist heat.  PLAN FOR NEXT SESSION: Review HEP and progress PRN, continue progression for core, hip, and LE strengthening, gait training, balance training    Elaine Daring, PT, DPT, LAT, ATC 08/09/24  11:46 AM Phone: [phone] Fax: [phone]

## 2024-08-09 NOTE — Patient Instructions (Signed)
 Access Code: [email or web address] URL: [email or web address] Date: 08/09/2024 Prepared by: Elaine Daring  Exercises - Sidelying Hip Abduction  - 3 x weekly - 3 sets - 10 reps - Bridge  - 3 x weekly - 3 sets - 10 reps - 5 seconds hold - Active Straight Leg Raise with Quad Set  - 3 x weekly - 3 sets - 10 reps - Sit to Stand Without Arm Support  - 3 x weekly - 3 sets - 10 reps - Standing Gastroc Stretch at Counter  - 1 x daily - 3 reps - 30 seconds hold - Standing Tandem Balance with Counter Support  - 1 x daily - 3 reps - 30 seconds hold - Standing Hip Abduction with Counter Support  - 3 x weekly - 3 sets - 10 reps - Standing Hip Extension with Counter Support  - 3 x weekly - 3 sets - 10 reps - Heel Raises with Counter Support  - 3 x weekly - 3 sets - 10 reps

## 2024-08-18 ENCOUNTER — Ambulatory Visit: Admitting: Physical Therapy

## 2024-08-18 ENCOUNTER — Other Ambulatory Visit: Payer: Self-pay

## 2024-08-18 ENCOUNTER — Encounter: Payer: Self-pay | Admitting: Physical Therapy

## 2024-08-18 DIAGNOSIS — M6281 Muscle weakness (generalized): Secondary | ICD-10-CM | POA: Diagnosis not present

## 2024-08-18 DIAGNOSIS — M5459 Other low back pain: Secondary | ICD-10-CM | POA: Diagnosis not present

## 2024-08-18 DIAGNOSIS — M79604 Pain in right leg: Secondary | ICD-10-CM

## 2024-08-18 NOTE — Therapy (Signed)
 OUTPATIENT PHYSICAL THERAPY TREATMENT    Patient Name: Anne [name] MRN: [id number] DOB:1945-12-02, 78 y.o., female Today's Date: 08/18/2024   END OF SESSION:  PT End of Session - 08/18/24 1159     Visit Number 12    Number of Visits 15    Date for Recertification  09/06/24    Authorization Type [employer] [facility]    Authorization Time Period 06/03/2024 - 09/01/2024    Authorization - Visit Number 7    Authorization - Number of Visits 8    Progress Note Due on Visit 20    PT Start Time 1152    PT Stop Time 1230    PT Time Calculation (min) 38 min    Activity Tolerance Patient tolerated treatment well    Behavior During Therapy WFL for tasks assessed/performed                     Past Medical History:  Diagnosis Date   GERD (gastroesophageal reflux disease) 08/29/2021   Hyperlipidemia    Hypertension    Osteopenia    per DEXA on 07-19-09   Stroke (HCC) 09/14/2007   has mild left leg weakness, saw Dr. Chalice   History reviewed. No pertinent surgical history. Patient Active Problem List   Diagnosis Date Noted   Pulsatile tinnitus of left ear 07/05/2024   Constipation 05/13/2024   Low back pain 03/24/2024   Acute pulmonary embolism (HCC) 02/12/2024   Hypokalemia 02/12/2024   Vertigo 12/01/2023   Bite, insect 04/30/2023   COVID-19 virus infection 05/27/2022   GERD (gastroesophageal reflux disease) 08/29/2021   Bradycardia, unspecified 01/02/2018   Mallet deformity of fourth finger, right 02/02/2014   Allergic rhinitis 05/08/2010   DIZZINESS 02/01/2010   Lipoma 08/11/2009   Disorder of bone and cartilage 08/11/2009   SUPRAVENTRICULAR PREMATURE BEATS 07/28/2008   FATIGUE 06/16/2008   MYOSITIS 05/12/2008   Hyperlipidemia 11/24/2007   Essential hypertension 11/24/2007    PCP: Johnny Garnette LABOR, MD  REFERRING PROVIDER: Joane Artist RAMAN, MD  REFERRING DIAG: Lumbar radiculopathy  Rationale for Evaluation and Treatment: Rehabilitation  THERAPY DIAG:  Pain  in right leg  Other low back pain  Muscle weakness (generalized)  ONSET DATE: 12/26/2023   SUBJECTIVE:         SUBJECTIVE STATEMENT: Patient reports that she got up yesterday and her right leg was hurting her. She states she has not done many of her exercises because her brother passed, but she has done some walking in her driveway.   Eval: Patient reports about 3 months ago she opened her car door and in an effort not to hit herself she stepped back oddly on the right leg and spun around. From that point on her right leg has been bothering her. Pain can travel from the right lower back all the way down to the calf and back up. States it is hard for her to describe her pain, it is not tender but still has pain that can be excruciating. Pain has been severely limiting her activity level. She has begun taking her medication more regularly the past few days and her pain has improved since that time.  PERTINENT HISTORY:  See PMH above  PAIN:  Are you having pain? Yes:  NPRS scale: 5/10 currently Pain location: Right lower back Pain description: Ache Aggravating factors: Standing, walking, moving around Relieving factors: Medication, ice, rest  PRECAUTIONS: None  PATIENT GOALS: Pain relief   OBJECTIVE:  Note: Objective measures were completed at  Evaluation unless otherwise noted. PATIENT SURVEYS:  PSFS: 3.66 Walking: 5 Moving around with speed: 3 Standing for a while: 3  06/03/2024: PSFS: 5 Walking: 5 Moving around with speed: 5 Standing for a while: 5  08/03/2024: PSFS: 5.67 Walking: 6 Moving around with speed: 5 Standing for a while: 6   SENSATION: WFL  MUSCLE LENGTH: Hamstring grossly WFL  POSTURE:   Rounded shoulder posture  PALPATION: Non-tender to palpation  Lumbar CPAs grossly WFL and non-painful  LUMBAR ROM:   AROM eval  Flexion WFL  Extension 75%  Right lateral flexion WFL  Left lateral flexion WFL  Right rotation WFL  Left rotation WFL    (Blank rows = not tested)  LOWER EXTREMITY ROM:      Hip PROM grossly WFL and non-painful  LOWER EXTREMITY MMT:    MMT Right eval Left eval Rt / Lt 04/21/2024 Rt / Lt 04/28/2024 Rt / Lt 05/04/2024 Rt / Lt 06/03/2024 Rt / Lt 07/19/2024 Rt / Lt 08/09/2024  Hip flexion 4- 4-        Hip extension 3 3+    3+ / 3+ 3+ / 3+ 3+ / 3+  Hip abduction 3- 3 3- / 3 3 / 3+ 3+ / 3+ 3+ / 3+ 3+ / 3+ 3+ / 3+  Hip adduction          Hip internal rotation          Hip external rotation          Knee flexion 5 5        Knee extension 5 5        Ankle dorsiflexion          Ankle plantarflexion          Ankle inversion          Ankle eversion           (Blank rows = not tested)  LUMBAR SPECIAL TESTS:  Slump and SLR negative for radicular symptoms  FUNCTIONAL TESTS:  5 times sit to stand: 16 seconds  04/28/2024: 14 seconds  06/03/2024: 13 seconds  07/07/2024: 12 seconds  GAIT: Assistive device utilized: None Level of assistance: Complete Independence Comments: Shuffling gait pattern   TREATMENT [facility] Adult PT Treatment:                                                DATE: 08/18/2024 Recumbent bike L4 x 8 min to improve endurance and workload capacity LTR x 10 Piriformis stretch 2 x 20 sec Modified thomas stretch with passive knee flexion 3 x 30 sec Seated hamstring stretch 2 x 20 sec Sit to stand 3 x 10 Standing hip abduction and extension with yellow at knees 2 x 10 each  PATIENT EDUCATION:  Education details: HEP Person educated: Patient Education method: Programmer, Multimedia, Demonstration, Actor cues, Verbal cues Education comprehension: verbalized understanding, returned demonstration, verbal cues required, tactile cues required, and needs further education  HOME EXERCISE PROGRAM: Access Code: [email or web address]   ASSESSMENT: CLINICAL IMPRESSION: Patient tolerated therapy well with no adverse effects. Patient arrives reporting increased right lower back and hip pain. Therapy focused on stretching  to reduce pain and improve her mobility, and continued strengthening for the core, hip, and LE. She does report improvement in symptoms following therapy. No changes made to HEP and reiterated importance of performing exercises at  home. Patient would benefit from continued skilled PT to progress mobility and strength in order to reduce pain and maximize functional ability.   Eval: Patient is a 78 y.o. female who was seen today for physical therapy evaluation and treatment for chronic right leg pain that does seem to be related to the lumbar spine. She has mild limitations with lumbar spine mobility and gross hip and core strength deficits, but was not reporting much pain or reproduction of her right leg symptoms with testing. She reported majority of pain this visit was located to the right gluteal region but unable to reproduce any pain with palpation of the lumbar spine or right hip/leg. Exercises provided to address strength deficits.  OBJECTIVE IMPAIRMENTS: Abnormal gait, decreased activity tolerance, decreased balance, decreased ROM, decreased strength, and pain.   ACTIVITY LIMITATIONS: carrying, lifting, standing, and locomotion level  PARTICIPATION LIMITATIONS: meal prep, cleaning, shopping, and community activity  PERSONAL FACTORS: Fitness, Past/current experiences, and Time since onset of injury/illness/exacerbation are also affecting patient's functional outcome.    GOALS: Goals reviewed with patient? Yes  SHORT TERM GOALS: Target date: 04/26/2024  Patient will be I with initial HEP in order to progress with therapy. Baseline: HEP provided at eval 04/28/2024: independent with initial HEP Goal status: MET  2.  Patient will report right leg pain at worst with activity </= 5/10 in order to reduce functional limitations Baseline: 7/10 04/28/2024: 6/10 05/04/2024: report 4/10 Goal status: MET  LONG TERM GOALS: Target date: 09/06/2024  Patient will be I with final HEP to maintain  progress from PT. Baseline: HEP provided at eval 06/03/2024: progressing 07/19/2024: progressing 08/09/2024: progressing Goal status: ONGOING  2.  Patient will report PSFS >/= 6 in order to indicate improvement in her functional status Baseline: 3.66 06/03/2024: 5 07/19/2024: not assessed 08/03/2024: 5.67 Goal status: ONGOING  3.  Patient will demonstrate hip strength >/= 4/5 MMT in order to improve her walking and activity tolerance Baseline: see limitations above 06/03/2024: see limitations above 07/19/2024: see limitations above 08/09/2024: see limitations above Goal status: ONGOING  4.  Patient will perform 5xSTS </= 12 seconds in order to indicate improvement in strength, mobility, and reduced fall risk Baseline: 16 seconds 04/28/2024: 14 seconds 06/03/2024: 13 seconds 07/07/2024: 12 seconds Goal status: MET   PLAN: PT FREQUENCY: 1x/week  PT DURATION: 4 weeks  PLANNED INTERVENTIONS: 97164- PT Re-evaluation, 97750- Physical Performance Testing, 97110-Therapeutic exercises, 97530- Therapeutic activity, 97112- Neuromuscular re-education, 97535- Self Care, 02859- Manual therapy, 97116- Gait training, [phone] (1-2 muscles), 20561 (3+ muscles)- Dry Needling, Patient/Family education, Balance training, Stair training, Joint mobilization, Joint manipulation, Spinal manipulation, Spinal mobilization, Cryotherapy, and Moist heat.  PLAN FOR NEXT SESSION: Review HEP and progress PRN, continue progression for core, hip, and LE strengthening, gait training, balance training    Elaine Daring, PT, DPT, LAT, ATC 08/18/24  12:37 PM Phone: [phone] Fax: [phone]

## 2024-08-19 ENCOUNTER — Ambulatory Visit: Payer: Self-pay

## 2024-08-19 ENCOUNTER — Encounter: Payer: Self-pay | Admitting: Internal Medicine

## 2024-08-19 ENCOUNTER — Encounter: Admitting: Internal Medicine

## 2024-08-19 VITALS — BP 130/74 | HR 70 | Temp 97.6°F | Wt 160.5 lb

## 2024-08-19 DIAGNOSIS — M79604 Pain in right leg: Secondary | ICD-10-CM

## 2024-08-19 NOTE — Progress Notes (Signed)
 This encounter was created in error - please disregard.

## 2024-08-19 NOTE — Telephone Encounter (Signed)
 Noted

## 2024-08-19 NOTE — Telephone Encounter (Signed)
 FYI Only or Action Required?: FYI only for provider: appointment scheduled on 08/19/24.  Patient was last seen in primary care on 07/23/2024 by Micheal Wolm ORN, MD.  Called Nurse Triage reporting Arm Pain and Leg Swelling.  Symptoms began yesterday.  Interventions attempted: OTC medications: tylenol .  Symptoms are: unchanged.  Triage Disposition: See HCP Within 4 Hours (Or PCP Triage)  Patient/caregiver understands and will follow disposition?: Yes           Copied from CRM [id number]. Topic: Clinical - Red Word Triage >> Aug 19, 2024  8:22 AM Anne Anne [name] wrote: [facility] that prompted transfer to Nurse Triage: Right arm severe pain, and left leg pain and swelling. Patient is on Eloquist. Left leg blood clot in march. Reason for Disposition  History of prior blood clot in leg or lungs (i.e., deep vein thrombosis, pulmonary embolism)  [1] Arm pains with exertion (e.g., walking) AND [2] pain goes away on resting AND [3] not present now  Answer Assessment - Initial Assessment Questions 1. ONSET: When did the pain start?     *No Answer* Endorses blood clot in leg in March 2. LOCATION: Where is the pain located?     Right arm, left leg 3. PAIN: How bad is the pain? (Scale 0-10; or none, mild, moderate, severe)     6/10 4. WORK OR EXERCISE: Has there been any recent work or exercise that involved this part of the body?     *No Answer* 5. CAUSE: What do you think is causing the arm pain?     Unknown - did endorsed sleeping differently that may affected arm 6. OTHER SYMPTOMS: Do you have any other symptoms? (e.g., neck pain, swelling, rash, fever, numbness, weakness)     denies 7. PREGNANCY: Is there any chance you are pregnant? When was your last menstrual period?     *No Answer*  Answer Assessment - Initial Assessment Questions 1. ONSET: When did the pain start?      Unable to answer 2. LOCATION: Where is the pain located?      Anne [name] - worsening x  2-3 days 3. PAIN: How bad is the pain?    (Scale 1-10; or mild, moderate, severe)     8/10 yesterday, now about 6/10 4. WORK OR EXERCISE: Has there been any recent work or exercise that involved this part of the body?      denies 5. CAUSE: What do you think is causing the leg pain?     Unknown but does endorse having hx of blood clot 6. OTHER SYMPTOMS: Do you have any other symptoms? (e.g., chest pain, back pain, breathing difficulty, swelling, rash, fever, numbness, weakness)     Blood specks... bruised places on ankle of Anne [name] 7. PREGNANCY: Is there any chance you are pregnant? When was your last menstrual period?     N/a  Protocols used: Arm Pain-A-AH, Leg Pain-A-AH

## 2024-08-25 ENCOUNTER — Encounter: Payer: Self-pay | Admitting: Physical Therapy

## 2024-08-25 ENCOUNTER — Ambulatory Visit: Admitting: Physical Therapy

## 2024-08-25 ENCOUNTER — Other Ambulatory Visit: Payer: Self-pay

## 2024-08-25 DIAGNOSIS — M5459 Other low back pain: Secondary | ICD-10-CM

## 2024-08-25 DIAGNOSIS — M6281 Muscle weakness (generalized): Secondary | ICD-10-CM | POA: Diagnosis not present

## 2024-08-25 DIAGNOSIS — M79604 Pain in right leg: Secondary | ICD-10-CM

## 2024-08-25 NOTE — Therapy (Signed)
 OUTPATIENT PHYSICAL THERAPY TREATMENT    Patient Name: Anne [name] MRN: [id number] DOB:01/04/1946, 78 y.o., female Today's Date: 08/25/2024   END OF SESSION:  PT End of Session - 08/25/24 1257     Visit Number 13    Number of Visits 15    Date for Recertification  09/06/24    Authorization Type [employer] [facility]    Authorization Time Period 06/03/2024 - 09/01/2024    Authorization - Visit Number 8    Authorization - Number of Visits 8    Progress Note Due on Visit 20    PT Start Time 1300    PT Stop Time 1340    PT Time Calculation (min) 40 min    Activity Tolerance Patient tolerated treatment well    Behavior During Therapy [facility] for tasks assessed/performed                      Past Medical History:  Diagnosis Date   GERD (gastroesophageal reflux disease) 08/29/2021   Hyperlipidemia    Hypertension    Osteopenia    per DEXA on 07-19-09   Stroke (HCC) 09/14/2007   has mild left leg weakness, saw Dr. Chalice   History reviewed. No pertinent surgical history. Patient Active Problem List   Diagnosis Date Noted   Pulsatile tinnitus of left ear 07/05/2024   Constipation 05/13/2024   Low back pain 03/24/2024   Acute pulmonary embolism (HCC) 02/12/2024   Hypokalemia 02/12/2024   Vertigo 12/01/2023   Bite, insect 04/30/2023   COVID-19 virus infection 05/27/2022   GERD (gastroesophageal reflux disease) 08/29/2021   Bradycardia, unspecified 01/02/2018   Mallet deformity of fourth finger, right 02/02/2014   Allergic rhinitis 05/08/2010   DIZZINESS 02/01/2010   Lipoma 08/11/2009   Disorder of bone and cartilage 08/11/2009   SUPRAVENTRICULAR PREMATURE BEATS 07/28/2008   FATIGUE 06/16/2008   MYOSITIS 05/12/2008   Hyperlipidemia 11/24/2007   Essential hypertension 11/24/2007    PCP: Johnny Garnette LABOR, MD  REFERRING PROVIDER: Joane Artist RAMAN, MD  REFERRING DIAG: Lumbar radiculopathy  Rationale for Evaluation and Treatment: Rehabilitation  THERAPY DIAG:   Pain in right leg  Other low back pain  Muscle weakness (generalized)  ONSET DATE: 12/26/2023   SUBJECTIVE:         SUBJECTIVE STATEMENT: Patient reports she had pain for 2-3 days after last visit but once that calmed down she felt better. She is trying to walk a little more and exercises in her chair. She states she hasn't been able to concentrate on the exercises since her brother's passing.   Eval: Patient reports about 3 months ago she opened her car door and in an effort not to hit herself she stepped back oddly on the right leg and spun around. From that point on her right leg has been bothering her. Pain can travel from the right lower back all the way down to the calf and back up. States it is hard for her to describe her pain, it is not tender but still has pain that can be excruciating. Pain has been severely limiting her activity level. She has begun taking her medication more regularly the past few days and her pain has improved since that time.  PERTINENT HISTORY:  See PMH above  PAIN:  Are you having pain? Yes:  NPRS scale: 5/10 currently Pain location: Right lower back Pain description: Ache Aggravating factors: Standing, walking, moving around Relieving factors: Medication, ice, rest  PRECAUTIONS: None  PATIENT GOALS: Pain  relief   OBJECTIVE:  Note: Objective measures were completed at Evaluation unless otherwise noted. PATIENT SURVEYS:  PSFS: 3.66 Walking: 5 Moving around with speed: 3 Standing for a while: 3  06/03/2024: PSFS: 5 Walking: 5 Moving around with speed: 5 Standing for a while: 5  08/03/2024: PSFS: 5.67 Walking: 6 Moving around with speed: 5 Standing for a while: 6   SENSATION: WFL  MUSCLE LENGTH: Hamstring grossly WFL  POSTURE:   Rounded shoulder posture  PALPATION: Non-tender to palpation  Lumbar CPAs grossly WFL and non-painful  LUMBAR ROM:   AROM eval  Flexion WFL  Extension 75%  Right lateral flexion WFL  Left  lateral flexion WFL  Right rotation WFL  Left rotation WFL   (Blank rows = not tested)  LOWER EXTREMITY ROM:      Hip PROM grossly WFL and non-painful  LOWER EXTREMITY MMT:    MMT Right eval Left eval Rt / Lt 04/21/2024 Rt / Lt 04/28/2024 Rt / Lt 05/04/2024 Rt / Lt 06/03/2024 Rt / Lt 07/19/2024 Rt / Lt 08/09/2024  Hip flexion 4- 4-        Hip extension 3 3+    3+ / 3+ 3+ / 3+ 3+ / 3+  Hip abduction 3- 3 3- / 3 3 / 3+ 3+ / 3+ 3+ / 3+ 3+ / 3+ 3+ / 3+  Hip adduction          Hip internal rotation          Hip external rotation          Knee flexion 5 5        Knee extension 5 5        Ankle dorsiflexion          Ankle plantarflexion          Ankle inversion          Ankle eversion           (Blank rows = not tested)  LUMBAR SPECIAL TESTS:  Slump and SLR negative for radicular symptoms  FUNCTIONAL TESTS:  5 times sit to stand: 16 seconds  04/28/2024: 14 seconds  06/03/2024: 13 seconds  07/07/2024: 12 seconds  GAIT: Assistive device utilized: None Level of assistance: Complete Independence Comments: Shuffling gait pattern   TREATMENT [facility] Adult PT Treatment:                                                DATE: 08/25/2024 Recumbent bike L4 x 8 min to improve endurance and workload capacity Sit to stand 3 x 10 Forward 6 step-up 2 x 10 each Standing heel raises 3 x 10 Standing hip abduction and extension with yellow at knees 2 x 10 each Standing alternating march 2 x 20 LAQ with 5# 3 x 10 each Tandem stance 3 x 30 sec each  PATIENT EDUCATION:  Education details: HEP update Person educated: Patient Education method: Explanation, Demonstration, Tactile cues, Verbal cues, Handouts Education comprehension: verbalized understanding, returned demonstration, verbal cues required, tactile cues required, and needs further education  HOME EXERCISE PROGRAM: Access Code: [email or web address]   ASSESSMENT: CLINICAL IMPRESSION: Patient tolerated therapy well with no adverse effects.  Therapy focused primarily on strengthening exercises in a standing position and continued with balance training. She does report improvement in how her legs feel following exercise and she demonstrated improvement in  balance with tandem stance. She does exhibit shuffling gait so worked on improving foot clearing with activities. Updated her HEP to work on improving foot clearance with walking and stairs and reiterated importance of performing exercises at home and progressing her walking as tolerated. Patient would benefit from continued skilled PT to progress mobility and strength in order to reduce pain and maximize functional ability.   Eval: Patient is a 78 y.o. female who was seen today for physical therapy evaluation and treatment for chronic right leg pain that does seem to be related to the lumbar spine. She has mild limitations with lumbar spine mobility and gross hip and core strength deficits, but was not reporting much pain or reproduction of her right leg symptoms with testing. She reported majority of pain this visit was located to the right gluteal region but unable to reproduce any pain with palpation of the lumbar spine or right hip/leg. Exercises provided to address strength deficits.  OBJECTIVE IMPAIRMENTS: Abnormal gait, decreased activity tolerance, decreased balance, decreased ROM, decreased strength, and pain.   ACTIVITY LIMITATIONS: carrying, lifting, standing, and locomotion level  PARTICIPATION LIMITATIONS: meal prep, cleaning, shopping, and community activity  PERSONAL FACTORS: Fitness, Past/current experiences, and Time since onset of injury/illness/exacerbation are also affecting patient's functional outcome.    GOALS: Goals reviewed with patient? Yes  SHORT TERM GOALS: Target date: 04/26/2024  Patient will be I with initial HEP in order to progress with therapy. Baseline: HEP provided at eval 04/28/2024: independent with initial HEP Goal status: MET  2.  Patient  will report right leg pain at worst with activity </= 5/10 in order to reduce functional limitations Baseline: 7/10 04/28/2024: 6/10 05/04/2024: report 4/10 Goal status: MET  LONG TERM GOALS: Target date: 09/06/2024  Patient will be I with final HEP to maintain progress from PT. Baseline: HEP provided at eval 06/03/2024: progressing 07/19/2024: progressing 08/09/2024: progressing Goal status: ONGOING  2.  Patient will report PSFS >/= 6 in order to indicate improvement in her functional status Baseline: 3.66 06/03/2024: 5 07/19/2024: not assessed 08/03/2024: 5.67 Goal status: ONGOING  3.  Patient will demonstrate hip strength >/= 4/5 MMT in order to improve her walking and activity tolerance Baseline: see limitations above 06/03/2024: see limitations above 07/19/2024: see limitations above 08/09/2024: see limitations above Goal status: ONGOING  4.  Patient will perform 5xSTS </= 12 seconds in order to indicate improvement in strength, mobility, and reduced fall risk Baseline: 16 seconds 04/28/2024: 14 seconds 06/03/2024: 13 seconds 07/07/2024: 12 seconds Goal status: MET   PLAN: PT FREQUENCY: 1x/week  PT DURATION: 4 weeks  PLANNED INTERVENTIONS: 97164- PT Re-evaluation, 97750- Physical Performance Testing, 97110-Therapeutic exercises, 97530- Therapeutic activity, 97112- Neuromuscular re-education, 97535- Self Care, 02859- Manual therapy, 97116- Gait training, [phone] (1-2 muscles), 20561 (3+ muscles)- Dry Needling, Patient/Family education, Balance training, Stair training, Joint mobilization, Joint manipulation, Spinal manipulation, Spinal mobilization, Cryotherapy, and Moist heat.  PLAN FOR NEXT SESSION: Review HEP and progress PRN, continue progression for core, hip, and LE strengthening, gait training, balance training    Elaine Daring, PT, DPT, LAT, ATC 08/25/24  1:47 PM Phone: [phone] Fax: [phone]

## 2024-08-25 NOTE — Patient Instructions (Signed)
 Access Code: [email or web address] URL: [email or web address] Date: 08/25/2024 Prepared by: Elaine Daring  Exercises - Sidelying Hip Abduction  - 3 x weekly - 3 sets - 10 reps - Bridge  - 3 x weekly - 3 sets - 10 reps - 5 seconds hold - Active Straight Leg Raise with Quad Set  - 3 x weekly - 3 sets - 10 reps - Sit to Stand Without Arm Support  - 3 x weekly - 3 sets - 10 reps - Standing Gastroc Stretch at Counter  - 1 x daily - 3 reps - 30 seconds hold - Standing Tandem Balance with Counter Support  - 1 x daily - 3 reps - 30 seconds hold - Standing Hip Abduction with Counter Support  - 3 x weekly - 3 sets - 10 reps - Standing Hip Extension with Counter Support  - 3 x weekly - 3 sets - 10 reps - Heel Raises with Counter Support  - 3 x weekly - 3 sets - 10 reps - Standing Marching  - 3 x weekly - 3 sets - 10 reps

## 2024-08-30 ENCOUNTER — Ambulatory Visit (HOSPITAL_COMMUNITY)
Admission: RE | Admit: 2024-08-30 | Discharge: 2024-08-30 | Disposition: A | Discharge status: Home / Self Care | Payer: Medicare Other | Source: Ambulatory Visit | Attending: Cardiology | Admitting: Cardiology

## 2024-08-30 DIAGNOSIS — H02831 Dermatochalasis of right upper eyelid: Secondary | ICD-10-CM | POA: Diagnosis not present

## 2024-08-30 DIAGNOSIS — H524 Presbyopia: Secondary | ICD-10-CM | POA: Diagnosis not present

## 2024-08-30 DIAGNOSIS — H04123 Dry eye syndrome of bilateral lacrimal glands: Secondary | ICD-10-CM | POA: Diagnosis not present

## 2024-08-30 DIAGNOSIS — I350 Nonrheumatic aortic (valve) stenosis: Secondary | ICD-10-CM | POA: Diagnosis present

## 2024-08-30 DIAGNOSIS — R0989 Other specified symptoms and signs involving the circulatory and respiratory systems: Secondary | ICD-10-CM | POA: Diagnosis present

## 2024-08-30 DIAGNOSIS — E782 Mixed hyperlipidemia: Secondary | ICD-10-CM | POA: Insufficient documentation

## 2024-08-30 DIAGNOSIS — H02834 Dermatochalasis of left upper eyelid: Secondary | ICD-10-CM | POA: Diagnosis not present

## 2024-08-30 DIAGNOSIS — H25813 Combined forms of age-related cataract, bilateral: Secondary | ICD-10-CM | POA: Diagnosis not present

## 2024-08-30 LAB — ECHOCARDIOGRAM COMPLETE
AR max vel: 1.39 cm2
AV Area VTI: 1.3 cm2
AV Area mean vel: 1.28 cm2
AV Mean grad: 24 mmHg
AV Peak grad: 29.8 mmHg
Ao pk vel: 2.73 m/s
Area-P 1/2: 2.89 cm2
P 1/2 time: 660 ms
S' Lateral: 2.4 cm

## 2024-08-31 ENCOUNTER — Encounter: Payer: Self-pay | Admitting: Physical Therapy

## 2024-08-31 ENCOUNTER — Other Ambulatory Visit: Payer: Self-pay

## 2024-08-31 ENCOUNTER — Ambulatory Visit: Admitting: Physical Therapy

## 2024-08-31 DIAGNOSIS — M5459 Other low back pain: Secondary | ICD-10-CM

## 2024-08-31 DIAGNOSIS — M6281 Muscle weakness (generalized): Secondary | ICD-10-CM

## 2024-08-31 DIAGNOSIS — M79604 Pain in right leg: Secondary | ICD-10-CM | POA: Diagnosis not present

## 2024-08-31 NOTE — Therapy (Signed)
 OUTPATIENT PHYSICAL THERAPY TREATMENT  DISCHARGE    Patient Name: Anne [name] MRN: [id number] DOB:[birth year], 78 y.o., female Today's Date: 08/31/2024   END OF SESSION:  PT End of Session - 08/31/24 1116     Visit Number 14    Number of Visits 15    Date for Recertification  09/06/24    Authorization Type [employer] [facility]    Authorization Time Period 06/03/2024 - 09/01/2024    PT Start Time 1101    PT Stop Time 1145    PT Time Calculation (min) 44 min    Activity Tolerance Patient tolerated treatment well    Behavior During Therapy [facility] for tasks assessed/performed                       Past Medical History:  Diagnosis Date   GERD (gastroesophageal reflux disease) 08/29/2021   Hyperlipidemia    Hypertension    Osteopenia    per DEXA on 07-19-09   Stroke (HCC) 09/14/2007   has mild left leg weakness, saw Dr. Chalice   History reviewed. No pertinent surgical history. Patient Active Problem List   Diagnosis Date Noted   Pulsatile tinnitus of left ear 07/05/2024   Constipation 05/13/2024   Low back pain 03/24/2024   Acute pulmonary embolism (HCC) 02/12/2024   Hypokalemia 02/12/2024   Vertigo 12/01/2023   Bite, insect 04/30/2023   COVID-19 virus infection 05/27/2022   GERD (gastroesophageal reflux disease) 08/29/2021   Bradycardia, unspecified 01/02/2018   Mallet deformity of fourth finger, right 02/02/2014   Allergic rhinitis 05/08/2010   DIZZINESS 02/01/2010   Lipoma 08/11/2009   Disorder of bone and cartilage 08/11/2009   SUPRAVENTRICULAR PREMATURE BEATS 07/28/2008   FATIGUE 06/16/2008   MYOSITIS 05/12/2008   Hyperlipidemia 11/24/2007   Essential hypertension 11/24/2007    PCP: Johnny Garnette LABOR, MD  REFERRING PROVIDER: Joane Artist RAMAN, MD  REFERRING DIAG: Lumbar radiculopathy  Rationale for Evaluation and Treatment: Rehabilitation  THERAPY DIAG:  Pain in right leg  Other low back pain  Muscle weakness (generalized)  ONSET DATE:  12/26/2023   SUBJECTIVE:         SUBJECTIVE STATEMENT: Patient reports yesterday her right leg was hurting really bad, but it finally stopped hurting this morning. She thinks the leg pain is related to eating fibrous food and digestion, because there are no specific activities or exercises that bring on the pain. The pain just comes and then it will go away. She states her PCP suggested taking a stool softener, so she did take some of that and drank a lot of water yesterday and it helped with the pain. She has been trying to do her exercises more and walking in the driveway, states the walking is getting better.   Eval: Patient reports about 3 months ago she opened her car door and in an effort not to hit herself she stepped back oddly on the right leg and spun around. From that point on her right leg has been bothering her. Pain can travel from the right lower back all the way down to the calf and back up. States it is hard for her to describe her pain, it is not tender but still has pain that can be excruciating. Pain has been severely limiting her activity level. She has begun taking her medication more regularly the past few days and her pain has improved since that time.  PERTINENT HISTORY:  See PMH above  PAIN:  Are you having pain?  Yes:  NPRS scale: 0/10 currently Pain location: Right lower back Pain description: Ache Aggravating factors: Standing, walking, moving around Relieving factors: Medication, ice, rest  PRECAUTIONS: None  PATIENT GOALS: Pain relief   OBJECTIVE:  Note: Objective measures were completed at Evaluation unless otherwise noted. PATIENT SURVEYS:  PSFS: 3.66 Walking: 5 Moving around with speed: 3 Standing for a while: 3  06/03/2024: PSFS: 5 Walking: 5 Moving around with speed: 5 Standing for a while: 5  08/03/2024: PSFS: 5.67 Walking: 6 Moving around with speed: 5 Standing for a while: 6  08/31/2024: PSFS: 6 Walking: 7 Moving around with speed:  5 Standing for a while: 6   SENSATION: WFL  MUSCLE LENGTH: Hamstring grossly WFL  POSTURE:   Rounded shoulder posture  PALPATION: Non-tender to palpation  Lumbar CPAs grossly WFL and non-painful  LUMBAR ROM:   AROM eval  Flexion WFL  Extension 75%  Right lateral flexion WFL  Left lateral flexion WFL  Right rotation WFL  Left rotation WFL   (Blank rows = not tested)  LOWER EXTREMITY ROM:      Hip PROM grossly WFL and non-painful  LOWER EXTREMITY MMT:    MMT Right eval Left eval Rt / Lt 04/21/2024 Rt / Lt 04/28/2024 Rt / Lt 05/04/2024 Rt / Lt 06/03/2024 Rt / Lt 07/19/2024 Rt / Lt 08/09/2024 Rt / Lt 08/31/2024  Hip flexion 4- 4-         Hip extension 3 3+    3+ / 3+ 3+ / 3+ 3+ / 3+ 3+ / 3+  Hip abduction 3- 3 3- / 3 3 / 3+ 3+ / 3+ 3+ / 3+ 3+ / 3+ 3+ / 3+ 3+ / 3+  Hip adduction           Hip internal rotation           Hip external rotation           Knee flexion 5 5         Knee extension 5 5         Ankle dorsiflexion           Ankle plantarflexion           Ankle inversion           Ankle eversion            (Blank rows = not tested)  LUMBAR SPECIAL TESTS:  Slump and SLR negative for radicular symptoms  FUNCTIONAL TESTS:  5 times sit to stand: 16 seconds  04/28/2024: 14 seconds  06/03/2024: 13 seconds  07/07/2024: 12 seconds  08/31/2024: 11 seconds  GAIT: Assistive device utilized: None Level of assistance: Complete Independence Comments: Shuffling gait pattern   TREATMENT [facility] Adult PT Treatment:                                                DATE: 08/31/2024 Recumbent bike L4 x 6 min to improve endurance and workload capacity Sit to stand 3 x 10 Standing heel raises 3 x 10 Standing hip abduction and extension 3 x 10 each Standing alternating march 3 x 20 Tandem stance 3 x 30 sec each  Discussed continuing with her HEP and walking program, consistency with her exercises and activity. Following-up with referring provider as needed  PATIENT  EDUCATION:  Education details: POC discharge, HEP Person educated: Patient  Education method: Explanation, Demonstration Education comprehension: Verbalized understanding, returned demonstration  HOME EXERCISE PROGRAM: Access Code: [email or web address]   ASSESSMENT: CLINICAL IMPRESSION: Patient tolerated therapy well with no adverse effects. She does report overall improvement in her right leg pain and functional level, but does report occasional right leg pain with no specific mechanism that seems to resolve itself over a few days. She reports she has been trying to do her exercises and walking more consistently, and when she does this she feels better.  Patient encouraged to remain consistent with her exercises and walking program, as increased activity with help with her strength, stamina, and also likely will help with digestion as well. She does continue to exhibit gross strength deficit of her hips but did improve with 5xSTS this visit. She does demonstrate independence with her exercises so will be formally discharged from PT at this time.    Eval: Patient is a 78 y.o. female who was seen today for physical therapy evaluation and treatment for chronic right leg pain that does seem to be related to the lumbar spine. She has mild limitations with lumbar spine mobility and gross hip and core strength deficits, but was not reporting much pain or reproduction of her right leg symptoms with testing. She reported majority of pain this visit was located to the right gluteal region but unable to reproduce any pain with palpation of the lumbar spine or right hip/leg. Exercises provided to address strength deficits.  OBJECTIVE IMPAIRMENTS: Abnormal gait, decreased activity tolerance, decreased balance, decreased ROM, decreased strength, and pain.   ACTIVITY LIMITATIONS: carrying, lifting, standing, and locomotion level  PARTICIPATION LIMITATIONS: meal prep, cleaning, shopping, and community activity  PERSONAL  FACTORS: Fitness, Past/current experiences, and Time since onset of injury/illness/exacerbation are also affecting patient's functional outcome.    GOALS: Goals reviewed with patient? Yes  SHORT TERM GOALS: Target date: 04/26/2024  Patient will be I with initial HEP in order to progress with therapy. Baseline: HEP provided at eval 04/28/2024: independent with initial HEP Goal status: MET  2.  Patient will report right leg pain at worst with activity </= 5/10 in order to reduce functional limitations Baseline: 7/10 04/28/2024: 6/10 05/04/2024: report 4/10 Goal status: MET  LONG TERM GOALS: Target date: 09/06/2024  Patient will be I with final HEP to maintain progress from PT. Baseline: HEP provided at eval 06/03/2024: progressing 07/19/2024: progressing 08/09/2024: progressing 08/31/2024: patient is independent with HEP, encouraged her to be consistent with exercises Goal status: MET  2.  Patient will report PSFS >/= 6 in order to indicate improvement in her functional status Baseline: 3.66 06/03/2024: 5 07/19/2024: not assessed 08/03/2024: 5.67 08/31/2024: 6 Goal status: MET  3.  Patient will demonstrate hip strength >/= 4/5 MMT in order to improve her walking and activity tolerance Baseline: see limitations above 06/03/2024: see limitations above 07/19/2024: see limitations above 08/09/2024: see limitations above 08/31/2024: grossly 3+/5 MMT Goal status: PARTIALLY MET  4.  Patient will perform 5xSTS </= 12 seconds in order to indicate improvement in strength, mobility, and reduced fall risk Baseline: 16 seconds 04/28/2024: 14 seconds 06/03/2024: 13 seconds 07/07/2024: 12 seconds 08/31/2024: 11 seconds Goal status: MET   PLAN: PT FREQUENCY: 1x/week  PT DURATION: 4 weeks  PLANNED INTERVENTIONS: 97164- PT Re-evaluation, 97750- Physical Performance Testing, 97110-Therapeutic exercises, 97530- Therapeutic activity, 97112- Neuromuscular re-education, 97535- Self Care, 02859-  Manual therapy, 97116- Gait training, [phone] (1-2 muscles), 20561 (3+ muscles)- Dry Needling, Patient/Family education, Balance training, Stair training, Joint mobilization, Joint manipulation, Spinal  manipulation, Spinal mobilization, Cryotherapy, and Moist heat.  PLAN FOR NEXT SESSION: Review HEP and progress PRN, continue progression for core, hip, and LE strengthening, gait training, balance training    Elaine Daring, PT, DPT, LAT, ATC 08/31/24  1:05 PM Phone: [phone] Fax: [phone]   PHYSICAL THERAPY DISCHARGE SUMMARY  Visits from Start of Care: 14  Current functional level related to goals / functional outcomes: See above   Remaining deficits: See above   Education / Equipment: HEP   Patient agrees to discharge. Patient goals were partially met. Patient is being discharged due to being pleased with the current functional level.

## 2024-09-03 ENCOUNTER — Ambulatory Visit: Admitting: Family Medicine

## 2024-09-03 ENCOUNTER — Encounter: Payer: Self-pay | Admitting: Family Medicine

## 2024-09-03 ENCOUNTER — Ambulatory Visit: Payer: Self-pay

## 2024-09-03 VITALS — BP 138/60 | HR 60 | Temp 97.7°F | Ht 65.0 in | Wt 160.3 lb

## 2024-09-03 DIAGNOSIS — M5416 Radiculopathy, lumbar region: Secondary | ICD-10-CM

## 2024-09-03 DIAGNOSIS — M79651 Pain in right thigh: Secondary | ICD-10-CM

## 2024-09-03 MED ORDER — GABAPENTIN 100 MG PO CAPS: 100.0000 mg | ORAL_CAPSULE | Freq: Every evening | ORAL | 1 refills | Status: AC | PRN

## 2024-09-03 MED ORDER — METHYLPREDNISOLONE 4 MG PO TBPK: ORAL_TABLET | ORAL | 0 refills | Status: DC

## 2024-09-03 NOTE — Telephone Encounter (Signed)
 Noted

## 2024-09-03 NOTE — Progress Notes (Signed)
 Acute Office Visit  Subjective:     Patient ID: Anne [name], female    DOB: 03/26/1946, 78 y.o.   MRN: [id number]  Chief Complaint  Patient presents with   Leg Pain    Patient complains of severe, intermittent right leg pain and concerned due to  history of DVT 4 months ago, tried Tylenol  last night with no relief    Leg Pain    Discussed the use of AI scribe software for clinical note transcription with the patient, who gave verbal consent to proceed.  History of Present Illness   Anne [name] is a 78 year old female with a history of deep vein thrombosis who presents with severe right leg pain.  She experiences severe pain in her right leg, with intermittent episodes that were particularly severe yesterday. The pain is exacerbated by pressure, such as tightening her shoe, causing a crease in her right foot and that's when the pain started. The pain is located in the back of her leg, including the hamstring, calf muscle, and groin area. It can occur randomly, even when sitting, but is not present during therapy sessions.  Patient has a recent history of acute PE from a hospitalization in May 2025, she is currently anticoagulated with Eliquis . States that there is no swelling of the right leg at this time.   She has been taking Tylenol , gabapentin , and Eliquis . Gabapentin  was prescribed and helped with her pain by Dr. Darleene, but she stopped it after a certain period. She also received a short course of steroids previously, which provided relief. The pain does not occur at night while resting but is exacerbated by movement or pressure on the vessels in her leg.  Wearing certain shoes causes a deep impression on her foot, leading to severe pain. She has been using a cane for mobility due to the pain, which has been present intermittently over the past few days.       Review of Systems  All other systems reviewed and are negative.       Objective:    BP 138/60   Pulse  60   Temp 97.7 F (36.5 C) (Oral)   Ht 5' 5 (1.651 m)   Wt 160 lb 4.8 oz (72.7 kg)   SpO2 98%   BMI 26.68 kg/m    Physical Exam Vitals reviewed.  Constitutional:      Appearance: Normal appearance. She is obese.  Cardiovascular:     Pulses: Normal pulses.  Pulmonary:     Effort: Pulmonary effort is normal.  Musculoskeletal:        General: No tenderness (no tenderness to palpation of the right calf). Normal range of motion.     Right lower leg: No edema.     Left lower leg: No edema.  Neurological:     Mental Status: She is alert and oriented to person, place, and time.  Psychiatric:        Mood and Affect: Mood normal.        Behavior: Behavior normal.     No results found for any visits on 09/03/24.      Assessment & Plan:   Problem List Items Addressed This Visit   None Visit Diagnoses       Right thigh pain    -  Primary   Relevant Medications   gabapentin  (NEURONTIN ) 100 MG capsule     Lumbar radiculopathy       Relevant Medications   gabapentin  (NEURONTIN )  100 MG capsule   methylPREDNISolone  (MEDROL  DOSEPAK) 4 MG TBPK tablet     Assessment and Plan    Right lower extremity radicular pain (sciatica) with right lower extremity pain Intermittent severe pain in the right lower extremity, primarily in the hamstring, calf, and groin areas, exacerbated by tight shoes and relieved by removing pressure. Pain is consistent with sciatica, likely nerve-related, as it occurs even at rest. Her risk of DVT is extremely low due to ongoing anticoagulation therapy. Previous imaging showed degenerative changes in the hip but no acute issues. Differential includes nerve compression or irritation, possibly related to footwear or posture. - Restart gabapentin  at bedtime for nerve pain management. - Prescribed a short course of steroids for flare-ups. - Advised wearing slip-on shoes or elastic laces to avoid pressure on the leg. - Recommended follow-up with Dr. Joane for  further evaluation and potential MRI of the back if pain persists.      Meds ordered this encounter  Medications   gabapentin  (NEURONTIN ) 100 MG capsule    Sig: Take 1-3 capsules (100-300 mg total) by mouth at bedtime as needed.    Dispense:  90 capsule    Refill:  1   methylPREDNISolone  (MEDROL  DOSEPAK) 4 MG TBPK tablet    Sig: Take package as directed.    Dispense:  21 each    Refill:  0    Return if symptoms worsen or fail to improve, for follow up with Dr. Darleene at [facility].  Heron CHRISTELLA Sharper, MD

## 2024-09-03 NOTE — Telephone Encounter (Signed)
 FYI Only or Action Required?: Action required by provider: request for appointment.  Patient was last seen in primary care on 08/19/2024 by Anne [name], Tully GRADE, MD.  Called Nurse Triage reporting Leg pain.  Symptoms began about a month ago.  Interventions attempted: Rest, hydration, or home remedies.  Symptoms are: unchanged.Continued left leg pain with movement.  Triage Disposition: See Physician Within 24 Hours  Patient/caregiver understands and will follow disposition?: Yes     Copied from CRM #[id number]. Topic: Clinical - Red Word Triage >> Sep 03, 2024  8:10 AM Carlyon D wrote: Red Word that prompted transfer to Nurse Triage: Left leg had previous blood clot pt states leg is giving her severe pain. Answer Assessment - Initial Assessment Questions 1. ONSET: When did the pain start?      Last week 2. LOCATION: Where is the pain located?      Left leg 3. PAIN: How bad is the pain?    (Scale 1-10; or mild, moderate, severe)     Can be a 10 4. WORK OR EXERCISE: Has there been any recent work or exercise that involved this part of the body?      no 5. CAUSE: What do you think is causing the leg pain?     unsure 6. OTHER SYMPTOMS: Do you have any other symptoms? (e.g., chest pain, back pain, breathing difficulty, swelling, rash, fever, numbness, weakness)     no 7. PREGNANCY: Is there any chance you are pregnant? When was your last menstrual period?     no  Protocols used: Leg Pain-A-AH

## 2024-09-07 ENCOUNTER — Telehealth: Payer: Self-pay | Admitting: Internal Medicine

## 2024-09-07 ENCOUNTER — Telehealth: Payer: Self-pay

## 2024-09-07 NOTE — Telephone Encounter (Signed)
 Spoke with pt, aware do not see that the echo has been reviewed and it looks like dr alveta wanted her to follow up with dr santo. She reports she has not decided if she wants to see him yet. She is going to review the providers in the practice and will let us  know who she would like to see. I was going to route the echocardiogram to dr santo but she ask me to wait until she decides who she is going to see. She will call back.

## 2024-09-07 NOTE — Telephone Encounter (Signed)
 Patient called to follow-up on echocardiogram test results.

## 2024-09-07 NOTE — Telephone Encounter (Signed)
 Copied from CRM [id number]. Topic: Clinical - Lab/Test Results >> Sep 07, 2024  1:55 PM Viola F wrote: Reason for CRM: Patient would like call back with echocardiogram results from 08/30/24. Please call her at [phone]   [facility] with patient and informed referral was sent from outside provider. Would have to call Cardio to get results.

## 2024-09-15 ENCOUNTER — Ambulatory Visit: Payer: Self-pay

## 2024-09-15 ENCOUNTER — Ambulatory Visit (HOSPITAL_COMMUNITY): Admission: EM | Admit: 2024-09-15 | Discharge: 2024-09-15 | Disposition: A | Discharge status: Home / Self Care

## 2024-09-15 ENCOUNTER — Ambulatory Visit (INDEPENDENT_AMBULATORY_CARE_PROVIDER_SITE_OTHER)

## 2024-09-15 ENCOUNTER — Encounter (HOSPITAL_COMMUNITY): Payer: Self-pay | Admitting: Emergency Medicine

## 2024-09-15 DIAGNOSIS — I700 Atherosclerosis of aorta: Secondary | ICD-10-CM | POA: Diagnosis not present

## 2024-09-15 DIAGNOSIS — R079 Chest pain, unspecified: Secondary | ICD-10-CM | POA: Diagnosis not present

## 2024-09-15 DIAGNOSIS — M25512 Pain in left shoulder: Secondary | ICD-10-CM | POA: Diagnosis not present

## 2024-09-15 DIAGNOSIS — S46912A Strain of unspecified muscle, fascia and tendon at shoulder and upper arm level, left arm, initial encounter: Secondary | ICD-10-CM | POA: Diagnosis not present

## 2024-09-15 DIAGNOSIS — R0789 Other chest pain: Secondary | ICD-10-CM | POA: Diagnosis not present

## 2024-09-15 NOTE — Telephone Encounter (Signed)
 FYI Only or Action Required?: FYI only for provider: ED advised. Pt declines EMS, reports brother or son to transport now.  Patient was last seen in primary care on 09/03/2024 by Ozell Heron HERO, MD.  Called Nurse Triage reporting Arm Pain.  Symptoms began today.  Interventions attempted: Rest, hydration, or home remedies.  Symptoms are: unchanged.  Triage Disposition: Go to ED Now (Notify PCP)  Patient/caregiver understands and will follow disposition?: Yes    Copied from CRM #[id number]. Topic: Clinical - Red Word Triage >> Sep 15, 2024  8:39 AM Montie POUR wrote: Red Word that prompted transfer to Nurse Triage:  Her blood pressure has been up 170/85 this morning; not sleeping well; this morning her left shoulder and arm is hurting. Pain level from an 3 to 8 Reason for Disposition  History of prior blood clot in leg or lungs (i.e., deep vein thrombosis, pulmonary embolism)  Answer Assessment - Initial Assessment Questions 1. LOCATION: Where does it hurt?       Pt notes she cannot say for sure 2. RADIATION: Does the pain go anywhere else? (e.g., into neck, jaw, arms, back)     Reports L arm/shoulder pain 3. ONSET: When did the chest pain begin? (Minutes, hours or days)      This AM, woke up with worsening left arm pain 4. PATTERN: Does the pain come and go, or has it been constant since it started?  Does it get worse with exertion?      Intermittent in duration and intensity 5. DURATION: How long does it last (e.g., seconds, minutes, hours)     Varies 6. SEVERITY: How bad is the pain?  (e.g., Scale 1-10; mild, moderate, or severe)     Mod-Severe 7. CARDIAC RISK FACTORS: Do you have any history of heart problems or risk factors for heart disease? (e.g., angina, prior heart attack; diabetes, high blood pressure, high cholesterol, smoker, or strong family history of heart disease)     HTN, HLD 8. PULMONARY RISK FACTORS: Do you have any history of lung disease?   (e.g., blood clots in lung, asthma, emphysema, birth control pills)     Hx of PE 9. CAUSE: What do you think is causing the chest pain?     Unknown 10. OTHER SYMPTOMS: Do you have any other symptoms? (e.g., dizziness, nausea, vomiting, sweating, fever, difficulty breathing, cough)       Pt reports intermittent dizziness, difficulty sleeping, weakness, unusual fatigue  Protocols used: Chest Pain-A-AH

## 2024-09-15 NOTE — Discharge Instructions (Signed)
  1. Chest pain, unspecified type (Primary) - ED EKG completed in UC shows sinus bradycardia with a ventricular rate of 58 bpm, moderate pulmonary disease pattern, possible wall incomplete right bundle branch block with LVH.  No STEMI. - DG Chest 2 View x-ray completed in UC shows no acute cardiopulmonary processes, no sign of consolidation or pneumonia.  2. Strain of left shoulder, initial encounter - DG Shoulder Left x-ray performed in UC shows no acute fracture or dislocation of the left shoulder, most likely muscle strain versus rotator cuff strain - Apply Sling & Swathe to left shoulder for protection and immobilization until symptoms improve - AMB referral to orthopedics for follow-up evaluation and management of left shoulder strain should symptoms persist with current prescribed therapy.  -Continue to monitor symptoms for any change in severity if there is any escalation of current symptoms or development of new symptoms follow-up in ER for further evaluation and management.

## 2024-09-15 NOTE — ED Provider Notes (Signed)
 [facility]   Note:  This document was prepared using Conservation officer, historic buildings and may include unintentional dictation errors.  MRN: [id number] DOB: [birth year]  Subjective:   Anne [name] is a 78 y.o. female presenting for persistent left shoulder pain since Saturday.  Patient denies any known injury or trauma.  Patient denies any past history of significant shoulder injuries.  Patient states that she did apply heat last night which seemed to help during treatment however as soon as she removed heat symptoms came back.  Patient is currently taking gabapentin  for lower extremity nerve pain as well as Tylenol  with minimal improvement to current symptoms.  No current facility-administered medications for this encounter.  Current Outpatient Medications:    amLODipine  (NORVASC ) 10 MG tablet, TAKE 1 TABLET (10 MG TOTAL) BY MOUTH DAILY. *APPOINTMENT REQUIRED FOR FUTURE REFILLS, Disp: 90 tablet, Rfl: 3   apixaban  (ELIQUIS ) 5 MG TABS tablet, Take 1 tablet (5 mg total) by mouth 2 (two) times daily., Disp: 180 tablet, Rfl: 3   benzonatate  (TESSALON ) 200 MG capsule, Take 1 capsule (200 mg total) by mouth 3 (three) times daily as needed for cough., Disp: 30 capsule, Rfl: 0   Calcium -Vitamin D (CALTRATE 600 PLUS-VIT D PO), Take 1 tablet by mouth daily., Disp: , Rfl:    ezetimibe  (ZETIA ) 10 MG tablet, Take 1 tablet (10 mg total) by mouth daily., Disp: 90 tablet, Rfl: 3   gabapentin  (NEURONTIN ) 100 MG capsule, Take 1-3 capsules (100-300 mg total) by mouth at bedtime as needed., Disp: 90 capsule, Rfl: 1   hydrochlorothiazide  (HYDRODIURIL ) 25 MG tablet, TAKE 1 TABLET BY MOUTH EVERY DAY, Disp: 90 tablet, Rfl: 0   HYDROcodone  bit-homatropine (HYCODAN) 5-1.5 MG/5ML syrup, Take 5 mLs by mouth every 4 (four) hours as needed., Disp: 240 mL, Rfl: 0   influenza vaccine adjuvanted (FLUAD ) 0.5 ML injection, Inject into the muscle., Disp: 0.5 mL, Rfl: 0   ketoconazole  (NIZORAL ) 2 % shampoo,  Apply 1 Application topically 2 (two) times a week., Disp: 120 mL, Rfl: 5   loratadine  (CLARITIN ) 10 MG tablet, Take 1 tablet (10 mg total) by mouth daily., Disp: 30 tablet, Rfl: 11   losartan  (COZAAR ) 50 MG tablet, Take 1 tablet (50 mg total) by mouth daily., Disp: 90 tablet, Rfl: 3   meclizine  (ANTIVERT ) 25 MG tablet, Take 1 tablet (25 mg total) by mouth every other day as needed for dizziness., Disp: 60 tablet, Rfl: 2   methylPREDNISolone  (MEDROL  DOSEPAK) 4 MG TBPK tablet, Take package as directed., Disp: 21 each, Rfl: 0   Multiple Vitamins-Minerals (CENTRUM SILVER PO), Take 1 tablet by mouth daily., Disp: , Rfl:    ondansetron  (ZOFRAN -ODT) 4 MG disintegrating tablet, Take 1 tablet (4 mg total) by mouth every 8 (eight) hours as needed for nausea or vomiting., Disp: 10 tablet, Rfl: 0   potassium chloride  SA (KLOR-CON  M20) 20 MEQ tablet, TAKE 1 TABLET BY MOUTH EVERY DAY, Disp: 90 tablet, Rfl: 3   rosuvastatin  (CRESTOR ) 10 MG tablet, Take 1 tablet (10 mg total) by mouth daily. (Patient taking differently: Take 20 mg by mouth daily.), Disp: 90 tablet, Rfl: 3   vitamin C (ASCORBIC ACID) 500 MG tablet, Take 500 mg by mouth daily., Disp: , Rfl:    Allergies  Allergen Reactions   Iodine Itching and Other (See Comments)    Lump in my tongue when I eat seafood.   Latex Itching and Other (See Comments)    GLOVES   Shellfish Allergy Swelling  Past Medical History:  Diagnosis Date   GERD (gastroesophageal reflux disease) 08/29/2021   Hyperlipidemia    Hypertension    Osteopenia    per DEXA on 07-19-09   Stroke (HCC) 09/14/2007   has mild left leg weakness, saw Dr. Chalice     History reviewed. No pertinent surgical history.  Family History  Family history unknown: Yes    Social History   Tobacco Use   Smoking status: Never   Smokeless tobacco: Never  Vaping Use   Vaping status: Never Used  Substance Use Topics   Alcohol use: Not Currently    Comment: rare   Drug use: No     ROS Refer to HPI for ROS details.  Objective:    Vitals: BP (!) 151/85 (BP Location: Right Arm)   Pulse 63   Temp 97.6 F (36.4 C) (Oral)   Resp 18   SpO2 96%   Physical Exam Vitals and nursing note reviewed.  Constitutional:      General: She is not in acute distress.    Appearance: Normal appearance. She is well-developed. She is not ill-appearing or toxic-appearing.  HENT:     Head: Normocephalic and atraumatic.  Cardiovascular:     Rate and Rhythm: Normal rate.  Pulmonary:     Effort: Pulmonary effort is normal. No respiratory distress.     Breath sounds: No stridor. No wheezing.  Chest:     Chest wall: Tenderness present.  Musculoskeletal:     Left shoulder: Tenderness and bony tenderness present. No swelling or deformity. Decreased range of motion. Decreased strength. Normal pulse.  Skin:    General: Skin is warm and dry.  Neurological:     General: No focal deficit present.     Mental Status: She is alert and oriented to person, place, and time.  Psychiatric:        Mood and Affect: Mood normal.        Behavior: Behavior normal.     Procedures  No results found for this or any previous visit (from the past 24 hours).  Assessment and Plan :     Discharge Instructions       1. Chest pain, unspecified type (Primary) - ED EKG completed in UC shows sinus bradycardia with a ventricular rate of 58 bpm, moderate pulmonary disease pattern, possible wall incomplete right bundle branch block with LVH.  No STEMI. - DG Chest 2 View x-ray completed in UC shows no acute cardiopulmonary processes, no sign of consolidation or pneumonia.  2. Strain of left shoulder, initial encounter - DG Shoulder Left x-ray performed in UC shows no acute fracture or dislocation of the left shoulder, most likely muscle strain versus rotator cuff strain - Apply Sling & Swathe to left shoulder for protection and immobilization until symptoms improve - AMB referral to orthopedics  for follow-up evaluation and management of left shoulder strain should symptoms persist with current prescribed therapy.  -Continue to monitor symptoms for any change in severity if there is any escalation of current symptoms or development of new symptoms follow-up in ER for further evaluation and management.      Aeryn Medici B Levy Wellman   Deshauna Cayson, [place], [place] 09/15/24 1254

## 2024-09-15 NOTE — Telephone Encounter (Signed)
 Pt was seen at UC this morning for this problem

## 2024-09-15 NOTE — ED Triage Notes (Signed)
 Pt c/o left shoulder pain since Saturday. Denies any injury. Reports put heat on shoulder last night that helped but was back this morning when woke up

## 2024-09-17 ENCOUNTER — Ambulatory Visit: Admitting: Family Medicine

## 2024-09-17 ENCOUNTER — Encounter: Payer: Self-pay | Admitting: Family Medicine

## 2024-09-17 ENCOUNTER — Ambulatory Visit: Payer: Self-pay | Admitting: *Deleted

## 2024-09-17 VITALS — BP 130/70 | HR 63 | Temp 97.5°F | Ht 65.0 in | Wt 159.0 lb

## 2024-09-17 DIAGNOSIS — R202 Paresthesia of skin: Secondary | ICD-10-CM

## 2024-09-17 DIAGNOSIS — M791 Myalgia, unspecified site: Secondary | ICD-10-CM

## 2024-09-17 DIAGNOSIS — Z23 Encounter for immunization: Secondary | ICD-10-CM

## 2024-09-17 LAB — VITAMIN B12: Vitamin B-12: 661 pg/mL (ref 211–911)

## 2024-09-17 LAB — CK: Total CK: 64 U/L (ref 17–177)

## 2024-09-17 LAB — HEMOGLOBIN A1C: Hgb A1c MFr Bld: 6 % (ref 4.6–6.5)

## 2024-09-17 NOTE — Telephone Encounter (Signed)
 She was seen today by Dr. Ozell

## 2024-09-17 NOTE — Progress Notes (Signed)
 Established Patient Office Visit  Subjective   Patient ID: Anne [name], female    DOB: [birth year]  Age: 78 y.o. MRN: [id number]  Chief Complaint  Patient presents with   Shoulder Pain    L shoulder pain; x1-2wks; dull aching pain; hurts to move it sometimes or raise it up; no injury done, no falls   Foot Pain    More tingling with pain going on both legs (R worse than L); like feet go to sleep and can't wake them up; x1-2wks    Shoulder Pain   Foot Pain  Discussed the use of AI scribe software for clinical note transcription with the patient, who gave verbal consent to proceed.  History of Present Illness   Anne [name] is a 78 year old female who presents with diffuse joint and muscle pain.  She has left shoulder pain radiating down her arm that began after cold exposure last week. An EKG and chest x-ray at urgent care were normal. The pain has improved slightly but persists, especially on waking and with movement.  She has ongoing right thigh pain present since November, sometimes extending to the left leg, with tingling in her fingers and feet. Pain improves with rest or lying down. She denies back pain.  She takes gabapentin  inconsistently because of dizziness and concern for falls. She previously received a steroid dose pack and gabapentin . She had been taking two rosuvastatin  and one ezetimibe  daily but was recently instructed to take one of each. She is worried her muscle and joint pain are from her cholesterol medications.  She takes Eliquis  for prior blood clots. She denies recent trauma or heavy lifting and is in physical therapy, which has helped her walk more comfortably.  She recalls a fall in the garage that she believes may have contributed to her leg pain. She tries to exercise but is limited by her symptoms.  She has moderate aortic valve stenosis on echocardiogram from two weeks ago. She denies shortness of breath or exercise intolerance.  She recalls  an A1c of 6.5% in December 2024, consistent with prediabetes.      Current Outpatient Medications  Medication Instructions   amLODipine  (NORVASC ) 10 MG tablet TAKE 1 TABLET (10 MG TOTAL) BY MOUTH DAILY. *APPOINTMENT REQUIRED FOR FUTURE REFILLS   apixaban  (ELIQUIS ) 5 mg, Oral, 2 times daily   ascorbic acid (VITAMIN C) 500 mg, Daily   benzonatate  (TESSALON ) 200 mg, Oral, 3 times daily PRN   Calcium -Vitamin D (CALTRATE 600 PLUS-VIT D PO) 1 tablet, Daily   cefUROXime  (CEFTIN ) 500 mg, Oral, 2 times daily with meals   ezetimibe  (ZETIA ) 10 mg, Oral, Daily   gabapentin  (NEURONTIN ) 100-300 mg, Oral, At bedtime PRN   hydrochlorothiazide  (HYDRODIURIL ) 25 mg, Oral, Daily   HYDROcodone  bit-homatropine (HYCODAN) 5-1.5 MG/5ML syrup 5 mLs, Oral, Every 4 hours PRN   influenza vaccine adjuvanted (FLUAD ) 0.5 ML injection Intramuscular   ketoconazole  (NIZORAL ) 2 % shampoo 1 Application, Topical, 2 times weekly   loratadine  (CLARITIN ) 10 mg, Oral, Daily   losartan  (COZAAR ) 50 mg, Oral, Daily   meclizine  (ANTIVERT ) 25 mg, Oral, Every 48 hours PRN   methylPREDNISolone  (MEDROL  DOSEPAK) 4 MG TBPK tablet Take package as directed.   Multiple Vitamins-Minerals (CENTRUM SILVER PO) 1 tablet, Daily   ondansetron  (ZOFRAN -ODT) 4 mg, Oral, Every 8 hours PRN   potassium chloride  SA (KLOR-CON  M20) 20 MEQ tablet 20 mEq, Oral, Daily   rosuvastatin  (CRESTOR ) 10 mg, Oral, Daily    Patient Active  Problem List   Diagnosis Date Noted   Pulsatile tinnitus of left ear 07/05/2024   Constipation 05/13/2024   Low back pain 03/24/2024   Acute pulmonary embolism (HCC) 02/12/2024   Hypokalemia 02/12/2024   Vertigo 12/01/2023   Bite, insect 04/30/2023   COVID-19 virus infection 05/27/2022   GERD (gastroesophageal reflux disease) 08/29/2021   Bradycardia, unspecified 01/02/2018   Mallet deformity of fourth finger, right 02/02/2014   Allergic rhinitis 05/08/2010   DIZZINESS 02/01/2010   Lipoma 08/11/2009   Disorder of bone and  cartilage 08/11/2009   SUPRAVENTRICULAR PREMATURE BEATS 07/28/2008   FATIGUE 06/16/2008   MYOSITIS 05/12/2008   Hyperlipidemia 11/24/2007   Essential hypertension 11/24/2007     Review of Systems  All other systems reviewed and are negative.     Objective:     BP 130/70   Pulse 63   Temp (!) 97.5 F (36.4 C)   Ht 5' 5 (1.651 m)   Wt 159 lb (72.1 kg)   SpO2 98%   BMI 26.46 kg/m    Physical Exam Vitals reviewed.  Constitutional:      Appearance: Normal appearance. She is normal weight.  Cardiovascular:     Rate and Rhythm: Normal rate and regular rhythm.     Pulses: Normal pulses.     Heart sounds: Murmur (loud SEM heard over the precordium) heard.  Pulmonary:     Breath sounds: Normal breath sounds. No wheezing.  Musculoskeletal:     Right lower leg: Edema (1+ pitting RLE) present.     Left lower leg: No edema.  Neurological:     Mental Status: She is alert and oriented to person, place, and time.      Results for orders placed or performed in visit on 09/17/24  Hemoglobin A1c  Result Value Ref Range   Hgb A1c MFr Bld 6.0 4.6 - 6.5 %  Vitamin B12  Result Value Ref Range   Vitamin B-12 661 211 - 911 pg/mL  CK  Result Value Ref Range   Total CK 64 17 - 177 U/L      The ASCVD Risk score (Arnett DK, et al., 2019) failed to calculate for the following reasons:   Risk score cannot be calculated because patient has a medical history suggesting prior/existing ASCVD   * - Cholesterol units were assumed    Assessment & Plan:  Myalgia -     CK; Future -     Vitamin B12; Future  Need for vaccination against Streptococcus pneumoniae -     Pneumococcal conjugate vaccine 20-valent  Paresthesia of both feet -     Hemoglobin A1c; Future  Prediabetes   Assessment and Plan    Myalgia and paresthesia, likely statin-induced, under evaluation Intermittent myalgia and paresthesia in the left shoulder, right thigh, and extremities. X-rays of the shoulder and  chest were normal. Differential diagnosis includes statin-induced myopathy, B12 deficiency, and neuropathy. Gabapentin  was previously prescribed but not consistently taken due to concerns about dizziness and falls. Rosuvastatin  and ezetimibe  may contribute to muscle pain. - Ordered CK and B12 levels to evaluate for muscle breakdown and deficiency. - Ordered A1c to assess for diabetes. - Instructed to stop rosuvastatin  for 14 days to evaluate for resolution of symptoms. - Advised to continue ezetimibe . - Recommended taking gabapentin  at bedtime to minimize dizziness and falls. - Referred to orthopedist for further evaluation of leg pain. - Advised follow-up with primary care physician if symptoms persist after stopping rosuvastatin .  Moderate aortic valve stenosis  Calcifications. Previous echocardiogram showed moderate calcification with a slight increase in gradient. No immediate intervention required unless symptoms develop. - Advised regular follow-up with cardiologist for monitoring of aortic valve stenosis. - Recommended regular echocardiograms to monitor valve progression.  Prediabetes Previous A1c of 6.5% in December 2024. Potential progression to diabetes could explain neuropathy symptoms. Lifestyle modifications including diet and exercise are important for management. - Ordered A1c to assess current glycemic control. - Advised follow-up with primary care physician for diabetes management if A1c is elevated.  General Health Maintenance Pneumonia vaccination is due.        No follow-ups on file.    Heron CHRISTELLA Sharper, MD

## 2024-09-17 NOTE — Telephone Encounter (Signed)
 FYI Only or Action Required?: FYI only for provider: appointment scheduled on 09/17/24.  Patient was last seen in primary care on 09/03/2024 by Ozell Heron HERO, MD.  Called Nurse Triage reporting Shoulder Pain. Bilateral leg pain, tingling  Symptoms began a week ago. 09/03/24  Interventions attempted: OTC medications: tylenol   and Other: went to [facility]  09/15/24.  Symptoms are: gradually worsening.  Triage Disposition: See HCP Within 4 Hours (Or PCP Triage)  Patient/caregiver understands and will follow disposition?: Yes    Recommended if sx worsen go to ED or call 911.            Copied from CRM #[id number]. Topic: Clinical - Red Word Triage >> Sep 17, 2024  9:52 AM Carlyon D wrote: Red Word that prompted transfer to Nurse Triage: L shoulder pain down the arm into torso area. Was seen in ER was told to follow up with ortho pt still has pain in hand and foot Reason for Disposition  [1] Shoulder pains with exertion (e.g., walking) AND [2] pain goes away on resting AND [3] not present now  Answer Assessment - Initial Assessment Questions Appt scheduled today with provider. None available with PCP until Monday. Patient reports ongoing pain left  top/ back of shoulder with mobility and exertion. No chest pain no difficulty breathing. Reports tingling bilateral feet  and left hand and right hand tingling comes and goes. Tylenol  not effective for pain . Patient last OV prescribed steroid dose pack and patient reports she did not take due to she got confused if she should take because someone told her not to take gabapentin  due to side effects of might make me fall. Reports blurred vision and glasses are the wrong prescription. Denies headache no slurred speech no weakness on either side of body. No dizziness. Does report feeling like she is having memory loss putting things down and forgetting where she puts them. Did not report this as new sx but on going. BP 170/86 pulse 66 this am.   Recommended if any sx worsening or new sx noted go to ED / call 911. Patient verbalized understanding.         1. ONSET: When did the pain start?     1 week ago  2. LOCATION: Where is the pain located?     Left top/ back of shoulder 3. PAIN: How bad is the pain? (Scale 1-10; or mild, moderate, severe)     None to mild pain now , soreness. With mobility worsening pain 4. WORK OR EXERCISE: Has there been any recent work or exercise that involved this part of the body?     No  5. CAUSE: What do you think is causing the shoulder pain?     No sure  6. OTHER SYMPTOMS: Do you have any other symptoms? (e.g., neck pain, swelling, rash, fever, numbness, weakness)     Memory issues laying things down and forgot where she lays them. Bilateral feet and legs feel numbness and at tingling at times. Dull ache in left shoulder reports blurred vision and glasses are incorrect strength. Left hand tingling and right tingling comes and goes and not present now.  7. PREGNANCY: Is there any chance you are pregnant? When was your last menstrual period?     na  Protocols used: Shoulder Pain-A-AH

## 2024-09-17 NOTE — Patient Instructions (Signed)
 STOP rosuvastatin  (crestor ) for 14 days. If at the end of the 14 days your muscle aches/pains have resolved then it is likely that the muscle and joint pain was being caused by the rosuvastatin .

## 2024-09-22 ENCOUNTER — Ambulatory Visit: Payer: Self-pay | Admitting: Family Medicine

## 2024-09-23 ENCOUNTER — Ambulatory Visit: Payer: Self-pay

## 2024-09-23 NOTE — Telephone Encounter (Signed)
 Patient wants advice on if she can take tylenol , gabapentin , and medrol  dosepak) together and instructions on how to take them from pcp-  FYI Only or Action Required?: Action required by provider: clinical question for provider and update on patient condition.  Patient was last seen in primary care on 09/17/2024 by Anne Heron HERO, MD.  Called Nurse Triage reporting Medication Problem.  Triage Disposition: Call PCP When Office is Open  Patient/caregiver understands and will follow disposition?: Yes         Copied from CRM #[id number]. Topic: Clinical - Medical Advice >> Sep 23, 2024  8:14 AM Anne [name] wrote: Reason for CRM: Patient called and stated she was prescribed gabapentin , Tylenol , methylprednisolone  not sure if she was supposed to take all 3 together.    Anne [name]- [phone] or [phone] Reason for Disposition  [1] Caller has NON-URGENT medicine question about med that PCP prescribed AND [2] triager unable to answer question  Answer Assessment - Initial Assessment Questions Patient looking for clarification on how to take her medications  Patient states she is taking her Eliquis  every day She is told to take Tylenol  as needed for pain  Patient started taking:  methylPREDNISolone  (MEDROL  DOSEPAK) 4 MG TBPK tablet [[id number]  yesterday  Patient wants to know if she is supposed to take her Gabapentin  with this steroid medication (Medrol  Dosepak)  Patient hasn't taken Tylenol  in the past few days  Patient just wants to know from her PCP [name] how to take her medications since she saw a different provider last visit and she wanted to know if she could take all of these medications together   Patient is advised to call us  back if anything changes or with any further questions/concerns. Patient is advised that if anything worsens to go to the Emergency Room. Patient verbalized understanding.  Protocols used: Medication Question Call-A-AH

## 2024-09-24 ENCOUNTER — Ambulatory Visit: Payer: Self-pay

## 2024-09-24 NOTE — Telephone Encounter (Signed)
 Follow up with pt. Inform pt of provider's information below. Pt reports she took medrol  dosepak this morning at 10am. It raises her BP reading. 180/69. Pt states she was having headache this morning. Now it is subsided. Also with blurry vision. She states she did not check her BP since then.  Request pt to recheck her. Pt reports her check BP is 186/82 on L and 189/95.  Advise pt to head to ER with her sx and blood pressure. Pt states she will figure it out and hang up the phone.   This cma relay message to Dr. Johnny. Dr. Johnny reports he doesn't think he needs to go to ER. Pt can stop taking the medrol  dosepak. The BP should goes down in the next few days. Advise pt not to exert herself. Pt has appt with Dr. Johnny on 12/15, pt can f/u at the visit.     Follow up with pt. Inform pt of message above. Pt ask if she can take her regular medications. Inform pt that should be okay. Pt updates that her first brother past away at the end of November. And states that may have made her BP goes up. Agreed that can play a factor to her high blood reading. Pt mention she had comes down with cold, cough. Advise pt to go to UC for treatment if she needs any medications as our office has no open slot. Pt can keep her appt with Dr. Johnny on 09/27/2024. Advise pt if her sx worsen with chest pain, SOB, blurry vision, headache to proceed to ER. Pt verbalized understanding.

## 2024-09-24 NOTE — Telephone Encounter (Signed)
 FYI Only or Action Required?: Action required by provider: update on patient condition.  Patient was last seen in primary care on 09/17/2024 by Anne Heron HERO, MD.  Called Nurse Triage reporting Advice Only.  Triage Disposition: Information or Advice Only Call  Patient/caregiver understands and will follow disposition?: Yes           Copied from CRM #[id number]. Topic: Clinical - Medication Question >> Sep 24, 2024  2:25 PM Anne [name] wrote: Reason for CRM: Patient has question regarding the prednisone  medication and she has a headache. Please call her at [phone] Reason for Disposition  Caller has medicine question only, adult not sick, AND triager answers question  Answer Assessment - Initial Assessment Questions This RN educated pt on new-worsening symptoms and when to call back/seek emergent care. Pt verbalized understanding and agrees to plan.  Pt already has an appointment scheduled for Mon, 12/15.   Pt states she spoke with Dr. Johnny today at 4 PM on the phone. No new/worsening symptoms since pt spoke with PCP today. Pt denies a current headache Yesterday slight headache that lingered until this morning.  Pt states she slept late which was unusual 161/83, hr 61 Sensation where hand went to sleep started last week, intermittent; PCP aware per pt  Protocols used: Medication Question Call-A-AH

## 2024-09-24 NOTE — Telephone Encounter (Signed)
 Yes she can take all these together

## 2024-09-27 ENCOUNTER — Encounter: Payer: Self-pay | Admitting: Family Medicine

## 2024-09-27 ENCOUNTER — Ambulatory Visit: Admitting: Family Medicine

## 2024-09-27 VITALS — BP 138/72 | HR 73 | Temp 97.2°F | Ht 62.0 in | Wt 160.0 lb

## 2024-09-27 DIAGNOSIS — J019 Acute sinusitis, unspecified: Secondary | ICD-10-CM

## 2024-09-27 DIAGNOSIS — M25512 Pain in left shoulder: Secondary | ICD-10-CM | POA: Diagnosis not present

## 2024-09-27 MED ORDER — CEFUROXIME AXETIL 500 MG PO TABS: 500.0000 mg | ORAL_TABLET | Freq: Two times a day (BID) | ORAL | 0 refills | Status: AC

## 2024-09-27 NOTE — Progress Notes (Signed)
° °  Subjective:    Patient ID: Anne [name], female    DOB: [birth year], 78 y.o.   MRN: [id number]  HPI Here to follow up a visit to urgent care for left shoulder pain on 09-15-24 and to discuss recent URI symptoms. She went to the urgent care for the sudden onset of left shoulder pain with no hx of recent trauma. At the visit she had Xrays of the left shoulder and the chest, and these were normal. She was felt to have a simple strain, and she was told to take Tylenol  as needed. She wore an arm sling for 2 days. The pain totally resolved in about 3 days. Now she began to have a hoarse voice and PND 3 days ago. Today she also has stuffiness in the head. No fever or ST or cough. Drinking fluids.    Review of Systems  Constitutional: Negative.   HENT:  Positive for congestion, postnasal drip and sinus pressure. Negative for ear pain and sore throat.   Eyes: Negative.   Respiratory: Negative.    Musculoskeletal:  Positive for arthralgias.       Objective:   Physical Exam Constitutional:      General: She is not in acute distress.    Appearance: Normal appearance.  HENT:     Right Ear: Tympanic membrane, ear canal and external ear normal.     Left Ear: Tympanic membrane, ear canal and external ear normal.     Nose: Nose normal.     Mouth/Throat:     Pharynx: Oropharynx is clear.  Eyes:     Conjunctiva/sclera: Conjunctivae normal.  Cardiovascular:     Rate and Rhythm: Normal rate and regular rhythm.     Pulses: Normal pulses.     Heart sounds: Normal heart sounds.  Pulmonary:     Effort: Pulmonary effort is normal.     Breath sounds: Normal breath sounds.  Musculoskeletal:     Comments: Left shoulder is normal with no tenderness and full ROM  Lymphadenopathy:     Cervical: No cervical adenopathy.  Neurological:     Mental Status: She is alert.           Assessment & Plan:  She had a left shoulder strain which has resolved. She also has a sinusitis, and we will treat this  with 10 days of Cefuroxime .  Garnette Olmsted, MD

## 2024-09-28 NOTE — Telephone Encounter (Signed)
 Noted

## 2024-09-30 ENCOUNTER — Emergency Department (HOSPITAL_COMMUNITY)
Admission: EM | Admit: 2024-09-30 | Discharge: 2024-10-01 | Disposition: A | Attending: Emergency Medicine | Admitting: Emergency Medicine

## 2024-09-30 ENCOUNTER — Other Ambulatory Visit: Payer: Self-pay

## 2024-09-30 ENCOUNTER — Ambulatory Visit: Payer: Self-pay

## 2024-09-30 ENCOUNTER — Emergency Department (HOSPITAL_COMMUNITY)

## 2024-09-30 DIAGNOSIS — M79651 Pain in right thigh: Secondary | ICD-10-CM | POA: Insufficient documentation

## 2024-09-30 DIAGNOSIS — Z7901 Long term (current) use of anticoagulants: Secondary | ICD-10-CM | POA: Diagnosis not present

## 2024-09-30 DIAGNOSIS — Z79899 Other long term (current) drug therapy: Secondary | ICD-10-CM | POA: Diagnosis not present

## 2024-09-30 DIAGNOSIS — I10 Essential (primary) hypertension: Secondary | ICD-10-CM | POA: Insufficient documentation

## 2024-09-30 DIAGNOSIS — R52 Pain, unspecified: Secondary | ICD-10-CM | POA: Diagnosis not present

## 2024-09-30 DIAGNOSIS — Z8673 Personal history of transient ischemic attack (TIA), and cerebral infarction without residual deficits: Secondary | ICD-10-CM | POA: Diagnosis not present

## 2024-09-30 DIAGNOSIS — Z91040 Latex allergy status: Secondary | ICD-10-CM | POA: Diagnosis not present

## 2024-09-30 DIAGNOSIS — R739 Hyperglycemia, unspecified: Secondary | ICD-10-CM | POA: Insufficient documentation

## 2024-09-30 LAB — CBC WITH DIFFERENTIAL/PLATELET
Abs Immature Granulocytes: 0.03 K/uL (ref 0.00–0.07)
Basophils Absolute: 0.1 K/uL (ref 0.0–0.1)
Basophils Relative: 0 %
Eosinophils Absolute: 0.2 K/uL (ref 0.0–0.5)
Eosinophils Relative: 2 %
HCT: 43.1 % (ref 36.0–46.0)
Hemoglobin: 14.2 g/dL (ref 12.0–15.0)
Immature Granulocytes: 0 %
Lymphocytes Relative: 17 %
Lymphs Abs: 2 K/uL (ref 0.7–4.0)
MCH: 30.6 pg (ref 26.0–34.0)
MCHC: 32.9 g/dL (ref 30.0–36.0)
MCV: 92.9 fL (ref 80.0–100.0)
Monocytes Absolute: 0.7 K/uL (ref 0.1–1.0)
Monocytes Relative: 6 %
Neutro Abs: 8.6 K/uL — ABNORMAL HIGH (ref 1.7–7.7)
Neutrophils Relative %: 75 %
Platelets: 479 K/uL — ABNORMAL HIGH (ref 150–400)
RBC: 4.64 MIL/uL (ref 3.87–5.11)
RDW: 13 % (ref 11.5–15.5)
WBC: 11.6 K/uL — ABNORMAL HIGH (ref 4.0–10.5)
nRBC: 0 % (ref 0.0–0.2)

## 2024-09-30 LAB — BASIC METABOLIC PANEL WITH GFR
Anion gap: 12 (ref 5–15)
BUN: 10 mg/dL (ref 8–23)
CO2: 28 mmol/L (ref 22–32)
Calcium: 9.7 mg/dL (ref 8.9–10.3)
Chloride: 97 mmol/L — ABNORMAL LOW (ref 98–111)
Creatinine, Ser: 0.6 mg/dL (ref 0.44–1.00)
GFR, Estimated: >60 mL/min (ref >60)
Glucose, Bld: 119 mg/dL — ABNORMAL HIGH (ref 70–99)
Potassium: 3.9 mmol/L (ref 3.5–5.1)
Sodium: 136 mmol/L (ref 135–145)

## 2024-09-30 LAB — PROTIME-INR
INR: 1.1 (ref 0.8–1.2)
Prothrombin Time: 14.5 s (ref 11.4–15.2)

## 2024-09-30 LAB — APTT: aPTT: 39 s — ABNORMAL HIGH (ref 24–36)

## 2024-09-30 LAB — TROPONIN T, HIGH SENSITIVITY: Troponin T High Sensitivity: <15 ng/L (ref 0–19)

## 2024-09-30 NOTE — ED Provider Triage Note (Signed)
 Emergency Medicine Provider Triage Evaluation Note  Anne [name] , a 78 y.o. female  was evaluated in triage.  Pt complains of right lower leg pain with history of DVT.  Patient also endorses some mild chest pain for the past couple days with no radiation.  Patient  Review of Systems  Positive: Chest pain, leg pain Negative: Shortness of breath, dizziness, syncope, headache  Physical Exam  BP (!) 152/81 (BP Location: Left Arm)   Pulse 65   Temp 97.6 F (36.4 C)   Resp 16   SpO2 99%  Gen:   Awake, no distress   Resp:  Normal effort clear auscultation all fields MSK:   Right leg pain and left leg swelling Other:    Medical Decision Making  Medically screening exam initiated at 7:17 PM.  Appropriate orders placed.  Anne [name] was informed that the remainder of the evaluation will be completed by another provider, this initial triage assessment does not replace that evaluation, and the importance of remaining in the ED until their evaluation is complete.  Patient has history of DVT and placed on Eliquis .  DVT was in August.  Patient has similar pain as when DVT was present.  Concern for treatment failure with Eliquis .  DVT rule be ordered along with cardiac workup.   Anne [name], [place] 09/30/24 [facility]

## 2024-09-30 NOTE — Telephone Encounter (Signed)
 FYI Only or Action Required?: FYI only for provider: ED advised.  Patient was last seen in primary care on 09/27/2024 by Johnny Garnette LABOR, MD.  Called Nurse Triage reporting Leg Pain.  Symptoms began hx of DVT in same location, states that last night the leg pain worsened and pt states tylenol  is not helping as it has in the past.  Interventions attempted: OTC medications: tylenol  and gabapentin .  Symptoms are: gradually worsening.  Triage Disposition: See HCP Within 4 Hours (Or PCP Triage)  Patient/caregiver understands and will follow disposition?: Yes, will follow disposition  Copied from CRM #[id number]. Topic: Clinical - Red Word Triage >> Sep 30, 2024  4:13 PM Chasity T wrote: [facility] that prompted transfer to Nurse Triage: severe leg pain in left leg, been taking tyenol 500 mg but isnt working. Reason for Disposition  [1] Thigh or calf pain AND [2] only 1 side AND [3] present > 1 hour  (Exception: Chronic unchanged pain.)  Answer Assessment - Initial Assessment Questions 1. ONSET: When did the pain start?      Has had intermittent problems for months 2. LOCATION: Where is the pain located?      L thigh, states that she started with pain in her stomach now in the leg 3. PAIN: How bad is the pain?    (Scale 1-10; or mild, moderate, severe)     moderate 5. CAUSE: What do you think is causing the leg pain?     Had DVT over summer 6. OTHER SYMPTOMS: Do you have any other symptoms? (e.g., chest pain, back pain, breathing difficulty, swelling, rash, fever, numbness, weakness)     Worse with movement,    Pt states she was told to take gabapentin  and tylenol . Pt states she does take eliquis , taken as prescribed. Denies numbness, denies temp changes in L leg. Denies SOB. Denies swelling and redness. Tylenol  does not control the pain, pt states that initially it was helping. Pt advised to utilize ED.  Protocols used: Leg Pain-A-AH

## 2024-09-30 NOTE — ED Triage Notes (Signed)
 Pt to er, pt states that she is here for leg pain, similar to the last time she had a blood clot in her leg.  Pt states that she also has some tingling in bilateral hands and feet.

## 2024-10-01 ENCOUNTER — Encounter (HOSPITAL_COMMUNITY): Payer: Self-pay

## 2024-10-01 ENCOUNTER — Emergency Department (HOSPITAL_COMMUNITY)

## 2024-10-01 DIAGNOSIS — R52 Pain, unspecified: Secondary | ICD-10-CM

## 2024-10-01 MED ORDER — HYDROCODONE-ACETAMINOPHEN 5-325 MG PO TABS
1.0000 | ORAL_TABLET | Freq: Once | ORAL | Status: AC
  Administered 2024-10-01: 1 via ORAL
  Filled 2024-10-01: qty 1

## 2024-10-01 NOTE — ED Notes (Signed)
 [name]) contacted to check the status of the vas ultrasound. Per [name], the ultrasound will be completed this morning. RN updated.

## 2024-10-01 NOTE — Telephone Encounter (Signed)
 Noted

## 2024-10-01 NOTE — ED Notes (Signed)
 Patient transported to vascular.

## 2024-10-01 NOTE — Discharge Instructions (Signed)
 Return for any problem.  ?

## 2024-10-01 NOTE — Progress Notes (Addendum)
 BLE venous duplex has been completed.  Preliminary results given to Dr. Laurice.   Results can be found under chart review under CV PROC. 10/01/2024 9:36 AM Enza Shone RVT, RDMS

## 2024-10-01 NOTE — ED Provider Notes (Signed)
 Patient seen after prior ED provider.  Workup was without evidence of significant acute abnormality.  Patient is comfortable with plan for discharge home.  Strict return precautions given and understood.   Laurice Maude BROCKS, MD 10/01/24 [phone]

## 2024-10-01 NOTE — ED Notes (Signed)
Pt returned from vascular

## 2024-10-01 NOTE — ED Notes (Signed)
 Pt given coffee and ice water per request

## 2024-10-01 NOTE — ED Provider Notes (Signed)
 " [place] EMERGENCY DEPARTMENT AT [facility] Provider Note   CSN: [id number] Arrival date & time: 09/30/24  1849     Patient presents with: Leg Pain (Right)   Anne [name] is a 78 y.o. female.   The history is provided by the patient.  Leg Pain  She has history of hypertension, hyperlipidemia, stroke, GERD, DVT anticoagulated on apixaban  and comes in complaining of pain in her right thigh with some radiation to her right calf and some radiation to the right hip.  Pain has been there since her diagnosis of DVT in June, but has gotten worse over the last 4 days and much worse over the last 2 days.  Acetaminophen  had been giving her relief, but has not been giving her relief for the last 4 days.  She has also noted some swelling in her feet.  She denies chest pain or dyspnea.  She has been compliant with her apixaban .    Prior to Admission medications  Medication Sig Start Date End Date Taking? Authorizing Provider  amLODipine  (NORVASC ) 10 MG tablet TAKE 1 TABLET (10 MG TOTAL) BY MOUTH DAILY. *APPOINTMENT REQUIRED FOR FUTURE REFILLS 07/21/24   Johnny Garnette LABOR, MD  apixaban  (ELIQUIS ) 5 MG TABS tablet Take 1 tablet (5 mg total) by mouth 2 (two) times daily. 03/03/24   Johnny Garnette LABOR, MD  benzonatate  (TESSALON ) 200 MG capsule Take 1 capsule (200 mg total) by mouth 3 (three) times daily as needed for cough. 07/23/24   Burchette, Wolm ORN, MD  Calcium -Vitamin D (CALTRATE 600 PLUS-VIT D PO) Take 1 tablet by mouth daily.    [provider]  cefUROXime  (CEFTIN ) 500 MG tablet Take 1 tablet (500 mg total) by mouth 2 (two) times daily with a meal for 10 days. 09/27/24 10/07/24  Johnny Garnette LABOR, MD  ezetimibe  (ZETIA ) 10 MG tablet Take 1 tablet (10 mg total) by mouth daily. 09/18/23   Nahser, Aleene PARAS, MD  gabapentin  (NEURONTIN ) 100 MG capsule Take 1-3 capsules (100-300 mg total) by mouth at bedtime as needed. 09/03/24   Ozell Heron HERO, MD  hydrochlorothiazide  (HYDRODIURIL ) 25 MG  tablet TAKE 1 TABLET BY MOUTH EVERY DAY 08/02/24   Johnny Garnette LABOR, MD  HYDROcodone  bit-homatropine [facility]) 5-1.5 MG/5ML syrup Take 5 mLs by mouth every 4 (four) hours as needed. 03/05/24   Johnny Garnette LABOR, MD  influenza vaccine adjuvanted (FLUAD ) 0.5 ML injection Inject into the muscle. 07/22/23   Luiz Channel, MD  ketoconazole  (NIZORAL ) 2 % shampoo Apply 1 Application topically 2 (two) times a week. 08/04/23   Johnny Garnette LABOR, MD  loratadine  (CLARITIN ) 10 MG tablet Take 1 tablet (10 mg total) by mouth daily. 01/31/21   Johnny Garnette LABOR, MD  losartan  (COZAAR ) 50 MG tablet Take 1 tablet (50 mg total) by mouth daily. 09/08/23   Nahser, Aleene PARAS, MD  meclizine  (ANTIVERT ) 25 MG tablet Take 1 tablet (25 mg total) by mouth every other day as needed for dizziness. 12/01/23   Johnny Garnette LABOR, MD  methylPREDNISolone  (MEDROL  DOSEPAK) 4 MG TBPK tablet Take package as directed. 09/03/24   Ozell Heron HERO, MD  Multiple Vitamins-Minerals (CENTRUM SILVER PO) Take 1 tablet by mouth daily.    [provider]  ondansetron  (ZOFRAN -ODT) 4 MG disintegrating tablet Take 1 tablet (4 mg total) by mouth every 8 (eight) hours as needed for nausea or vomiting. 05/26/22   Banister, Pamela K, MD  potassium chloride  SA (KLOR-CON  M20) 20 MEQ tablet TAKE 1 TABLET  BY MOUTH EVERY DAY 06/21/24   Johnny Garnette LABOR, MD  rosuvastatin  (CRESTOR ) 10 MG tablet Take 1 tablet (10 mg total) by mouth daily. Patient taking differently: Take 20 mg by mouth daily. 09/18/23   Nahser, Aleene PARAS, MD  vitamin C (ASCORBIC ACID) 500 MG tablet Take 500 mg by mouth daily.    [provider]    Allergies: Iodine, Latex, and Shellfish allergy    Review of Systems  All other systems reviewed and are negative.   Updated Vital Signs BP (!) 156/80 (BP Location: Left Arm)   Pulse 64   Temp 98.2 F (36.8 C)   Resp 16   SpO2 97%   Physical Exam Vitals and nursing note reviewed.   78 year old female, resting comfortably and in no acute  distress. Vital signs are significant for elevated blood pressure. Oxygen saturation is 97%, which is normal. Head is normocephalic and atraumatic. PERRLA, EOMI.  Back is nontender and there is no CVA tenderness.  Straight leg raise is negative bilaterally. Lungs are clear without rales, wheezes, or rhonchi. Chest is nontender. Heart has regular rate and rhythm with 2/6 systolic ejection murmur best heard along the left sternal border. Abdomen is soft, flat, nontender. Extremities have trace edema which is symmetric.  There is no tenderness to palpation, negative Homans' sign.  Right calf circumference is 1 cm greater than left calf circumference, right thigh circumference is 2 cm greater than left thigh circumference, full range of motion is present of all joints without pain. Skin is warm and dry without rash. Neurologic: Mental status is normal, cranial nerves are intact, moves all extremities equally.  (all labs ordered are listed, but only abnormal results are displayed) Labs Reviewed  CBC WITH DIFFERENTIAL/PLATELET - Abnormal; Notable for the following components:      Result Value   WBC 11.6 (*)    Platelets 479 (*)    Neutro Abs 8.6 (*)    All other components within normal limits  BASIC METABOLIC PANEL WITH GFR - Abnormal; Notable for the following components:   Chloride 97 (*)    Glucose, Bld 119 (*)    All other components within normal limits  APTT - Abnormal; Notable for the following components:   aPTT 39 (*)    All other components within normal limits  PROTIME-INR  TROPONIN T, HIGH SENSITIVITY    EKG: EKG Interpretation Date/Time:  Thursday September 30 2024 19:32:17 EST Ventricular Rate:  63 PR Interval:  160 QRS Duration:  96 QT Interval:  444 QTC Calculation: 454 R Axis:   -50  Text Interpretation: Sinus rhythm with Premature atrial complexes Left anterior fascicular block Left ventricular hypertrophy with repolarization abnormality ( R in aVL , [employer]  product , Romhilt-Estes ) Abnormal ECG When compared with ECG of 15-Sep-2024 11:53, No significant change was found Confirmed by Raford Lenis ([id number]) on 10/01/2024 12:21:21 AM  Radiology: DG Chest 1 View Result Date: 09/30/2024 EXAM: 1 VIEW(S) XRAY OF THE CHEST 09/30/2024 07:45:00 PM COMPARISON: 09/15/2024 CLINICAL HISTORY: Chest pain FINDINGS: LUNGS AND PLEURA: No focal pulmonary opacity. No pleural effusion. No pneumothorax. HEART AND MEDIASTINUM: No acute abnormality of the cardiac and mediastinal silhouettes. Atherosclerotic plaque. BONES AND SOFT TISSUES: No acute osseous abnormality. IMPRESSION: 1. No acute findings. 2. Atherosclerotic plaque. Electronically signed by: Dorethia Molt MD 09/30/2024 07:49 PM EST RP Workstation: [id number]     Procedures   Medications Ordered in the ED - No data to display  Medical Decision Making  Right thigh pain and patient with history of DVT concerning for recurrent DVT.  Differential diagnosis also includes sciatica, muscle strain.  Only finding on physical exam was increased calf and thigh circumference, but it is unclear if this is new considering she does have pre-existing DVT.  I have reviewed her laboratory tests, and my interpretation is mild leukocytosis which is nonspecific, elevated random glucose level which will need to be followed as an outpatient, normal troponin, mildly elevated aPTT which is likely secondary to use of anticoagulants, normal INR.  Chest x-ray shows no acute cardiopulmonary process.  Have independently viewed the image, and agree with the radiologist's interpretation.  I have reviewed her electrocardiogram, and my interpretation is left ventricular hypertrophy, left anterior fascicular block, PACs, repolarization changes secondary to left ventricular hypertrophy - unchanged from prior.  Venous vascular ultrasound has been ordered.  If positive for DVT, she will need to be switched to enoxaparin  and be referred to vascular surgery.     Final diagnoses:  Pain in right thigh  Elevated random blood glucose level  Anticoagulated on apixaban     ED Discharge Orders     None          Raford Lenis, MD 10/01/24 0120  "

## 2024-10-04 ENCOUNTER — Telehealth: Payer: Self-pay | Admitting: *Deleted

## 2024-10-04 ENCOUNTER — Telehealth: Payer: Self-pay

## 2024-10-04 NOTE — Telephone Encounter (Signed)
 She is seeing Dr. Artist Huxley for this, so she should ask him

## 2024-10-04 NOTE — Telephone Encounter (Signed)
 There is no need to worry about her glucoses. Her A1c on 09-17-24 was 6.0%. This is consistent with prediabetes but not full diabetes. We will continue to watch this

## 2024-10-04 NOTE — Telephone Encounter (Signed)
 Copied from CRM #[id number]. Topic: Clinical - Medical Advice >> Oct 04, 2024  9:16 AM Aleatha BROCKS wrote: Reason for CRM: Patient went emergency room at [place] for leg pain and patient didn't have a blood cot in leg and emergency room wanted to her to inform Dr Johnny, patient also wanted to know if she should schedule a appointment to come in please give her a call to confirm

## 2024-10-04 NOTE — Telephone Encounter (Signed)
 Spoke with pt states that she went to the ED on 09/30/24 for right leg pain, pt had US  done to check for blood clots, test was negative. Pt states that she is still in pain scale (5) states that she is taking gabapentin  and Tylenol  but they are not helping the pain. Pt wants to know if she needs to come in or she can having something stronger for pain called in to her pharmacy. Please advise

## 2024-10-04 NOTE — Telephone Encounter (Signed)
 Copied from CRM [id number]. Topic: Clinical - Medical Advice >> Oct 04, 2024  3:45 PM [name] wrote: Reason for CRM: PT went to ER last week and they said her Glucose level is high-wanting to know what Dr Johnny should have her do- Asking for Anne [name] to call her when she is able to

## 2024-10-05 ENCOUNTER — Telehealth: Payer: Self-pay | Admitting: *Deleted

## 2024-10-05 NOTE — Telephone Encounter (Signed)
 Pt notified, voiced understanding.

## 2024-10-05 NOTE — Telephone Encounter (Signed)
 Copied from CRM #[id number]. Topic: General - Other >> Oct 05, 2024  8:36 AM Franky GRADE wrote: Reason for CRM: Patient is returning a call she received from the office, advised patient of the message left by Dr.Fry. Patient understood and had no questions.

## 2024-10-05 NOTE — Telephone Encounter (Signed)
 Spoke with pt this morning advised of Dr Johnny recommendation

## 2024-10-05 NOTE — Telephone Encounter (Signed)
 Spoke with pt advised of Dr Alyne Babinski recommendation, pt voiced understanding

## 2024-10-12 ENCOUNTER — Ambulatory Visit: Payer: Self-pay

## 2024-10-12 NOTE — Telephone Encounter (Signed)
 FYI Only or Action Required?: Action required by provider: clinical question for provider.  Patient was last seen in primary care on 09/27/2024 by Johnny Garnette LABOR, MD.  Called Nurse Triage reporting Leg Pain.  Symptoms began several months ago.  Interventions attempted: Nothing.  Symptoms are: stable.  Triage Disposition: Call PCP Within 24 Hours  Patient/caregiver understands and will follow disposition?: Yes              Copied from CRM #[id number]. Topic: Clinical - Red Word Triage >> Oct 12, 2024  1:52 PM Anne [name] wrote: Red Word that prompted transfer to Nurse Triage: Patient having leg pain Reason for Disposition  [1] Follow-up call from patient regarding patient's clinical status AND [2] information NON-URGENT  Answer Assessment - Initial Assessment Questions 1. REASON FOR CALL or QUESTION: What is your reason for calling today? or How can I best     Patient would like Anne [name] opinion: should she wear compression knee high socks. She states wearing regular elastic socks she gets pains in her right leg. She states she has a sporadic pain in the right leg since her DVT in the leg in August. Patient denies chest pain, SOB, swelling. She states the pain today is 4/10 and upper/behind the knee. She also added she is following up with Anne Joane (ortho) on 10/20/24.  2. CALLER: Document the source of call. (e.g., laboratory staff, caregiver or patient).     Patient.  Protocols used: PCP Call - No Triage-A-AH

## 2024-10-12 NOTE — Telephone Encounter (Signed)
 Yes, she should try wearing knee high compression stockings for this

## 2024-10-13 NOTE — Telephone Encounter (Signed)
 Spoke with pt advised of Dr Johnny response voiced understanding

## 2024-10-20 ENCOUNTER — Ambulatory Visit: Admitting: Family Medicine

## 2024-10-20 VITALS — BP 148/80 | HR 57 | Ht 62.0 in | Wt 154.0 lb

## 2024-10-20 DIAGNOSIS — M5416 Radiculopathy, lumbar region: Secondary | ICD-10-CM

## 2024-10-20 NOTE — Progress Notes (Signed)
"       ° °  LILLETTE Ileana Collet, PhD, LAT, ATC acting as a scribe for Artist Lloyd, MD.  Anne [name] is a 79 y.o. female who presents to [facility] today for cont'd R leg pain. Pt was last seen by Dr. Lloyd on 04/28/24 and was advised to cont HEP and PT, completing 14 visits (d/c 11/18).  Today, pt reports she has cont'd to work on HEP, but will still have intermittent pain in her R leg. R leg pain was severe about 3-wks ago, resulting w/ a ED visit on 12/18. Pain is located along the posterior aspect of her R thigh and R groin.   She notes the pain into the right anterior thigh and groin and radiating down her leg have improved quite a bit over the last few weeks.  She does not think it is bad enough right now that she needs medicine or further evaluation..  Dx testing: 10/01/24 LE bilat vasc US  03/03/24 R hip XR   Pertinent review of systems: No fevers or chills  Relevant historical information: Hypertension.  History of pulmonary embolism.   Exam:  BP (!) 148/80   Pulse (!) 57   Ht 5' 2 (1.575 m)   Wt 154 lb (69.9 kg)   SpO2 98%   BMI 28.17 kg/m  General: Well Developed, well nourished, and in no acute distress.   MSK: L-spine decreased lumbar motion normal hip motion.  Intact strength.    Lab and Radiology Results No results found for this or any previous visit (from the past 72 hours). No results found.     Assessment and Plan: 79 y.o. female with chronic low back pain and right anterior hip pain and pain radiating down the right leg.  It is difficult to tell how bad this is.  Right now is not bad at all times has been bad enough to require an emergency room visit.  After discussion plan to give it another month with conservative management.  If not improved consider advanced imaging such as lumbar spine MRI or right hip MRI arthrogram.  Return in 1 month.  Recommend MRI lumbar spine as next up to if not better.   PDMP not reviewed this  encounter. No orders of the defined types were placed in this encounter.  No orders of the defined types were placed in this encounter.    Discussed warning signs or symptoms. Please see discharge instructions. Patient expresses understanding.   The above documentation has been reviewed and is accurate and complete Artist Lloyd, M.D. Total encounter time 30 minutes including face-to-face time with the patient and, reviewing past medical record, and charting on the date of service.     "

## 2024-10-20 NOTE — Patient Instructions (Addendum)
 Thank you for coming in today.   Continue Home Exercise Program.   We can order a MRI if your symptoms don't improve.   See you back in 1 month.

## 2024-10-29 ENCOUNTER — Ambulatory Visit: Payer: Self-pay

## 2024-10-29 NOTE — Telephone Encounter (Signed)
 FYI Only or Action Required?: FYI only for provider: appointment scheduled on 11/02/24.  Patient was last seen in primary care on 09/27/2024 by Johnny Garnette LABOR, MD.  Called Nurse Triage reporting Urinary Frequency.  Symptoms began several days ago.  Interventions attempted: Prescription medications: gabapentin ; tylenol .  Symptoms are: unchanged.  Triage Disposition: See Physician Within 24 Hours  Patient/caregiver understands and will follow disposition?: Yes      Message from Willamina W sent at 10/29/2024  2:54 PM EST  Reason for Triage: worsening frequent urination      Reason for Disposition  Urinating more frequently than usual (i.e., frequency) OR new-onset of the feeling of an urgent need to urinate (i.e., urgency)  Answer Assessment - Initial Assessment Questions Pt called to report increased urination; no burning, no pain, no fever, no increased thirst. PT reports she has been taking gabapentin  for her legs; s/e of increased urination with rx, denies incontinence. Pt denies any signs of holding fluid, no swelling of legs or SOB. Pt would like to monitor over weekend then f/u with PCP. Pt okay not to come if for appt if not needed but wanted to schedule in event symptoms stay over the weekend. Appointment scheduled for evaluation. Patient agrees with plan of care, and will call back if anything changes, or if symptoms worsen.       1. SYMPTOM: What's the main symptom you're concerned about? (e.g., frequency, incontinence)     Frequency   2. ONSET: When did the  symptoms  start?     This week   3. PAIN: Is there any pain? If Yes, ask: How bad is it? (Scale: 1-10; mild, moderate, severe)     No   4. CAUSE: What do you think is causing the symptoms?     Unsure; pt denies increased thirst, no fever, no pain/burning with urination. PT states she started drinking decaf green tea.   5. OTHER SYMPTOMS: Do you have any other symptoms? (e.g., blood in urine,  fever, flank pain, pain with urination)     No  Protocols used: Urinary Symptoms-A-AH

## 2024-11-02 ENCOUNTER — Ambulatory Visit: Admitting: Family Medicine

## 2024-11-02 ENCOUNTER — Encounter: Payer: Self-pay | Admitting: Family Medicine

## 2024-11-02 VITALS — BP 118/70 | HR 57 | Temp 97.9°F | Wt 157.2 lb

## 2024-11-02 DIAGNOSIS — R350 Frequency of micturition: Secondary | ICD-10-CM

## 2024-11-02 LAB — POC URINALSYSI DIPSTICK (AUTOMATED)
Bilirubin, UA: NEGATIVE
Blood, UA: NEGATIVE
Glucose, UA: NEGATIVE
Ketones, UA: NEGATIVE
Leukocytes, UA: NEGATIVE
Nitrite, UA: NEGATIVE
Protein, UA: NEGATIVE
Spec Grav, UA: <=1.005 — AB
Urobilinogen, UA: 0.2 U/dL
pH, UA: 6.5

## 2024-11-02 NOTE — Progress Notes (Signed)
" ° °  Subjective:    Patient ID: Anne [name], female    DOB: 1946-09-22, 79 y.o.   MRN: [id number]  HPI Here for frequent urinations over the past month. She also gets up to urinate several times a night. There is no burning or urgency. She started drinking green tea about the time this started. Her A1c on 09-17-24 was 6.0%.    Review of Systems  Constitutional: Negative.   Respiratory: Negative.    Cardiovascular: Negative.   Gastrointestinal: Negative.   Genitourinary:  Positive for frequency. Negative for dysuria, flank pain, hematuria and urgency.       Objective:   Physical Exam Constitutional:      Appearance: Normal appearance.  Cardiovascular:     Rate and Rhythm: Normal rate and regular rhythm.     Pulses: Normal pulses.     Heart sounds: Normal heart sounds.  Pulmonary:     Effort: Pulmonary effort is normal.     Breath sounds: Normal breath sounds.  Neurological:     Mental Status: She is alert.           Assessment & Plan:  Frequent urinations, likely a side effect of the green tea she has been drinking. She will stop this for a few weeks to see if it stops.  Garnette Olmsted, MD   "

## 2024-11-02 NOTE — Addendum Note (Signed)
 Addended by: LADONNA INOCENTE SAILOR on: 11/02/2024 11:21 AM   Modules accepted: Orders

## 2024-11-03 ENCOUNTER — Ambulatory Visit: Payer: Medicare PPO

## 2024-11-03 VITALS — Ht 62.0 in | Wt 157.0 lb

## 2024-11-03 DIAGNOSIS — Z0000 Encounter for general adult medical examination without abnormal findings: Secondary | ICD-10-CM | POA: Diagnosis not present

## 2024-11-03 NOTE — Progress Notes (Signed)
 "  Chief Complaint  Patient presents with   Medicare Wellness     Subjective:   Anne [name] is a 79 y.o. female who presents for a Medicare Annual Wellness Visit.  Visit info / Clinical Intake: Medicare Wellness Visit Type:: Subsequent Annual Wellness Visit Persons participating in visit and providing information:: patient Medicare Wellness Visit Mode:: Telephone If telephone:: video declined Since this visit was completed virtually, some vitals may be partially provided or unavailable. Missing vitals are due to the limitations of the virtual format.: Documented vitals are patient reported If Telephone or Video please confirm:: I connected with patient using audio/video enable telemedicine. I verified patient identity with two identifiers, discussed telehealth limitations, and patient agreed to proceed. Patient Location:: Home Provider Location:: Office Interpreter Needed?: No Pre-visit prep was completed: yes AWV questionnaire completed by patient prior to visit?: no Living arrangements:: (!) lives alone Patient's Overall Health Status Rating: good Typical amount of pain: some Does pain affect daily life?: (!) yes (Followed by medical attention) Are you currently prescribed opioids?: no  Dietary Habits and Nutritional Risks How many meals a day?: 3 Eats fruit and vegetables daily?: yes Most meals are obtained by: preparing own meals In the last 2 weeks, have you had any of the following?: none Diabetic:: no  Functional Status Activities of Daily Living (to include ambulation/medication): Independent Ambulation: Independent with device- listed below Home Assistive Devices/Equipment: Eyeglasses; Cane Medication Administration: Independent Home Management (perform basic housework or laundry): Independent Manage your own finances?: yes Primary transportation is: driving Concerns about vision?: no *vision screening is required for WTM* Concerns about hearing?: no  Fall  Screening Falls in the past year?: 0 Number of falls in past year: 0 Was there an injury with Fall?: 0 Fall Risk Category Calculator: 0 Patient Fall Risk Level: Low Fall Risk  Fall Risk Patient at Risk for Falls Due to: No Fall Risks Fall risk Follow up: Falls evaluation completed  Home and Transportation Safety: All rugs have non-skid backing?: yes All stairs or steps have railings?: yes Grab bars in the bathtub or shower?: yes Have non-skid surface in bathtub or shower?: yes Good home lighting?: yes Regular seat belt use?: yes Hospital stays in the last year:: (!) yes How many hospital stays:: 1 Reason: Rt leg blood clot  Cognitive Assessment Difficulty concentrating, remembering, or making decisions? : no Will 6CIT or Mini Cog be Completed: yes What year is it?: 0 points What month is it?: 0 points Give patient an address phrase to remember (5 components): [address] [place]  About what time is it?: 0 points Count backwards from 20 to 1: 0 points Say the months of the year in reverse: 0 points Repeat the address phrase from earlier: 0 points 6 CIT Score: 0 points  Advance Directives (For Healthcare) Does Patient Have a Medical Advance Directive?: No Would patient like information on creating a medical advance directive?: No - Patient declined  Reviewed/Updated  Reviewed/Updated: Reviewed All (Medical, Surgical, Family, Medications, Allergies, Care Teams, Patient Goals)    Allergies (verified) Iodine, Latex, and Shellfish allergy   Current Medications (verified) Outpatient Encounter Medications as of 11/03/2024  Medication Sig   amLODipine  (NORVASC ) 10 MG tablet TAKE 1 TABLET (10 MG TOTAL) BY MOUTH DAILY. *APPOINTMENT REQUIRED FOR FUTURE REFILLS   apixaban  (ELIQUIS ) 5 MG TABS tablet Take 1 tablet (5 mg total) by mouth 2 (two) times daily.   benzonatate  (TESSALON ) 200 MG capsule Take 1 capsule (200 mg total) by mouth  3 (three) times daily as needed for  cough.   Calcium -Vitamin D (CALTRATE 600 PLUS-VIT D PO) Take 1 tablet by mouth daily.   ezetimibe  (ZETIA ) 10 MG tablet Take 1 tablet (10 mg total) by mouth daily.   gabapentin  (NEURONTIN ) 100 MG capsule Take 1-3 capsules (100-300 mg total) by mouth at bedtime as needed.   hydrochlorothiazide  (HYDRODIURIL ) 25 MG tablet TAKE 1 TABLET BY MOUTH EVERY DAY   influenza vaccine adjuvanted (FLUAD ) 0.5 ML injection Inject into the muscle.   ketoconazole  (NIZORAL ) 2 % shampoo Apply 1 Application topically 2 (two) times a week.   loratadine  (CLARITIN ) 10 MG tablet Take 1 tablet (10 mg total) by mouth daily.   losartan  (COZAAR ) 50 MG tablet Take 1 tablet (50 mg total) by mouth daily.   meclizine  (ANTIVERT ) 25 MG tablet Take 1 tablet (25 mg total) by mouth every other day as needed for dizziness.   Multiple Vitamins-Minerals (CENTRUM SILVER PO) Take 1 tablet by mouth daily.   ondansetron  (ZOFRAN -ODT) 4 MG disintegrating tablet Take 1 tablet (4 mg total) by mouth every 8 (eight) hours as needed for nausea or vomiting.   potassium chloride  SA (KLOR-CON  M20) 20 MEQ tablet TAKE 1 TABLET BY MOUTH EVERY DAY   rosuvastatin  (CRESTOR ) 10 MG tablet Take 1 tablet (10 mg total) by mouth daily. (Patient taking differently: Take 20 mg by mouth daily.)   vitamin C (ASCORBIC ACID) 500 MG tablet Take 500 mg by mouth daily.   No facility-administered encounter medications on file as of 11/03/2024.    History: Past Medical History:  Diagnosis Date   GERD (gastroesophageal reflux disease) 08/29/2021   Hyperlipidemia    Hypertension    Osteopenia    per DEXA on 07-19-09   Stroke (HCC) 09/14/2007   has mild left leg weakness, saw Dr. Chalice   History reviewed. No pertinent surgical history. Family History  Family history unknown: Yes   Social History   Occupational History   Occupation: part printmaker  Tobacco Use   Smoking status: Never   Smokeless tobacco: Never  Vaping Use   Vaping status: Never Used   Substance and Sexual Activity   Alcohol use: Not Currently    Comment: rare   Drug use: No   Sexual activity: Not Currently   Tobacco Counseling Counseling given: No  SDOH Screenings   Food Insecurity: No Food Insecurity (11/03/2024)  Housing: Low Risk (11/03/2024)  Transportation Needs: No Transportation Needs (11/03/2024)  Utilities: Not At Risk (11/03/2024)  Alcohol Screen: Low Risk (10/29/2023)  Depression (PHQ2-9): Low Risk (11/03/2024)  Financial Resource Strain: Low Risk (10/29/2023)  Physical Activity: Insufficiently Active (11/03/2024)  Social Connections: Moderately Integrated (11/03/2024)  Stress: No Stress Concern Present (11/03/2024)  Tobacco Use: Low Risk (11/03/2024)  Health Literacy: Adequate Health Literacy (11/03/2024)   See flowsheets for full screening details  Depression Screen PHQ 2 & 9 Depression Scale- Over the past 2 weeks, how often have you been bothered by any of the following problems? Little interest or pleasure in doing things: 0 Feeling down, depressed, or hopeless (PHQ Adolescent also includes...irritable): 0 PHQ-2 Total Score: 0     Goals Addressed               This Visit's Progress     Continue physical activity (pt-stated)        Lose weight!             Objective:    Today's Vitals   11/03/24 0920  Weight: 157  lb (71.2 kg)  Height: 5' 2 (1.575 m)   Body mass index is 28.72 kg/m.  Hearing/Vision screen Hearing Screening - Comments:: Denies hearing difficulties   Vision Screening - Comments:: Wears rx glasses - up to date with routine eye exams with [facility]  Immunizations and Health Maintenance Health Maintenance  Topic Date Due   COVID-19 Vaccine (4 - 2025-26 season) 06/14/2024   Zoster Vaccines- Shingrix (1 of 2) 12/16/2024 (Originally 07/15/1965)   Medicare Annual Wellness (AWV)  11/03/2025   DTaP/Tdap/Td (2 - Td or Tdap) 08/02/2032   Pneumococcal Vaccine: 50+ Years  Completed   Influenza Vaccine  Completed    Bone Density Scan  Completed   Hepatitis C Screening  Completed   Meningococcal B Vaccine  Aged Out   Mammogram  Discontinued        Assessment/Plan:  This is a routine wellness examination for [place].  Patient Care Team: Johnny Garnette LABOR, MD as PCP - General (Family Medicine)  I have personally reviewed and noted the following in the patients chart:   Medical and social history Use of alcohol, tobacco or illicit drugs  Current medications and supplements including opioid prescriptions. Functional ability and status Nutritional status Physical activity Advanced directives List of other physicians Hospitalizations, surgeries, and ER visits in previous 12 months Vitals Screenings to include cognitive, depression, and falls Referrals and appointments  No orders of the defined types were placed in this encounter.  In addition, I have reviewed and discussed with patient certain preventive protocols, quality metrics, and best practice recommendations. A written personalized care plan for preventive services as well as general preventive health recommendations were provided to patient.   Rojelio LELON Blush, LPN   8/[id number]/7973   Return in 53 weeks (on 11/09/2025).  After Visit Summary: (MyChart) Due to this being a telephonic visit, the after visit summary with patients personalized plan was offered to patient via MyChart   Nurse Notes: No voiced or noted concerns at this time "

## 2024-11-03 NOTE — Patient Instructions (Addendum)
 Ms. Sprung,  Thank you for taking the time for your Medicare Wellness Visit. I appreciate your continued commitment to your health goals. Please review the care plan we discussed, and feel free to reach out if I can assist you further.  Please note that Annual Wellness Visits do not include a physical exam. Some assessments may be limited, especially if the visit was conducted virtually. If needed, we may recommend an in-person follow-up with your provider.  Ongoing Care Seeing your primary care provider every 3 to 6 months helps us  monitor your health and provide consistent, personalized care.   Referrals If a referral was made during today's visit and you haven't received any updates within two weeks, please contact the referred provider directly to check on the status.  Recommended Screenings:  Health Maintenance  Topic Date Due   COVID-19 Vaccine (4 - 2025-26 season) 06/14/2024   Zoster (Shingles) Vaccine (1 of 2) 12/16/2024*   Medicare Annual Wellness Visit  11/03/2025   DTaP/Tdap/Td vaccine (2 - Td or Tdap) 08/02/2032   Pneumococcal Vaccine for age over 79  Completed   Flu Shot  Completed   Osteoporosis screening with Bone Density Scan  Completed   Hepatitis C Screening  Completed   Meningitis B Vaccine  Aged Out   Breast Cancer Screening  Discontinued  *Topic was postponed. The date shown is not the original due date.       11/03/2024    9:20 AM  Advanced Directives  Does Patient Have a Medical Advance Directive? No  Would patient like information on creating a medical advance directive? No - Patient declined    Vision: Annual vision screenings are recommended for early detection of glaucoma, cataracts, and diabetic retinopathy. These exams can also reveal signs of chronic conditions such as diabetes and high blood pressure.  Dental: Annual dental screenings help detect early signs of oral cancer, gum disease, and other conditions linked to overall health, including  heart disease and diabetes.  Please see the attached documents for additional preventive care recommendations.

## 2024-11-05 ENCOUNTER — Other Ambulatory Visit: Payer: Self-pay | Admitting: Family Medicine

## 2024-11-08 NOTE — Progress Notes (Incomplete)
 " Cardiology Office Note:  .   Date:  11/08/2024  ID:  Anne [name], DOB April 24, 1946, MRN [id number] PCP: Johnny Garnette LABOR, MD  [facility] Providers Cardiologist:  None { Chief Complaint: No chief complaint on file.   History of Present Illness: .    Anne [name] is a 79 y.o. female with a PMH of elevated CAC, moderate AS, prior CVA, HTN, HLD, PE/DVT (on Eliquis ), and prediabetes who presents for follow-up.   Pre-Chart Notes: - Last seen in clinic in 2024.  Discussed the use of AI scribe software for clinical note transcription with the patient, who gave verbal consent to proceed.  History of Present Illness       Studies Reviewed: SABRA    EKG: ***       Cardiac Studies & Procedures   ______________________________________________________________________________________________     ECHOCARDIOGRAM  ECHOCARDIOGRAM COMPLETE 08/30/2024  Narrative ECHOCARDIOGRAM REPORT    Patient Name:   Anne [name] Date of Exam: 08/30/2024 Medical Rec #:  [id number]        Height:       65.0 in Accession #:    [id number]       Weight:       160.5 lb Date of Birth:  Oct 06, 1946        BSA:          1.802 m Patient Age:    78 years         BP:           160/76 mmHg Patient Gender: F                HR:           57 bpm. Exam Location:  [address]  Procedure: 2D Echo, Cardiac Doppler and Color Doppler (Both Spectral and Color Flow Doppler were utilized during procedure).  Indications:    I35.0 Aortic Stenosis  History:        Patient has prior history of Echocardiogram examinations, most recent 02/12/2024. Stroke; Risk Factors:Hypertension and Dyslipidemia.  Sonographer:    Carl Rodgers-Jones RDCS Referring Phys: [id number] PHILIP J NAHSER  IMPRESSIONS   1. Left ventricular ejection fraction, by estimation, is 60 to 65%. The left ventricle has normal function. The left ventricle has no regional wall motion abnormalities. Left ventricular diastolic parameters  are consistent with Grade I diastolic dysfunction (impaired relaxation). 2. Right ventricular systolic function is normal. The right ventricular size is normal. There is mildly elevated pulmonary artery systolic pressure. The estimated right ventricular systolic pressure is 42.6 mmHg. 3. Left atrial size was mildly dilated. 4. Right atrial size was mildly dilated. 5. The mitral valve is normal in structure. Mild mitral valve regurgitation. No evidence of mitral stenosis. 6. Tricuspid valve regurgitation is mild to moderate. 7. The aortic valve is tricuspid. There is moderate calcification of the aortic valve. Aortic valve regurgitation is mild. Moderate aortic valve stenosis. Aortic valve area, by VTI measures 1.30 cm. Aortic valve mean gradient measures 24.0 mmHg. 8. The inferior vena cava is normal in size with <50% respiratory variability, suggesting right atrial pressure of 8 mmHg.  FINDINGS Left Ventricle: Left ventricular ejection fraction, by estimation, is 60 to 65%. The left ventricle has normal function. The left ventricle has no regional wall motion abnormalities. The left ventricular internal cavity size was normal in size. There is no left ventricular hypertrophy. Left ventricular diastolic parameters are consistent with Grade I diastolic dysfunction (impaired relaxation).  Right Ventricle: The  right ventricular size is normal. No increase in right ventricular wall thickness. Right ventricular systolic function is normal. There is mildly elevated pulmonary artery systolic pressure. The tricuspid regurgitant velocity is 2.94 m/s, and with an assumed right atrial pressure of 8 mmHg, the estimated right ventricular systolic pressure is 42.6 mmHg.  Left Atrium: Left atrial size was mildly dilated.  Right Atrium: Right atrial size was mildly dilated.  Pericardium: There is no evidence of pericardial effusion.  Mitral Valve: The mitral valve is normal in structure. Mild mitral annular  calcification. Mild mitral valve regurgitation. No evidence of mitral valve stenosis.  Tricuspid Valve: The tricuspid valve is normal in structure. Tricuspid valve regurgitation is mild to moderate.  The aortic valve is tricuspid. There is moderate calcification of the aortic valve. Aortic valve regurgitation is mild. Moderate aortic stenosis is present. Pulmonic Valve: The pulmonic valve was normal in structure. Pulmonic valve regurgitation is not visualized.  Aorta: The aortic root is normal in size and structure.  Venous: The inferior vena cava is normal in size with less than 50% respiratory variability, suggesting right atrial pressure of 8 mmHg.  IAS/Shunts: No atrial level shunt detected by color flow Doppler.   LEFT VENTRICLE PLAX 2D LVIDd:         3.80 cm   Diastology LVIDs:         2.40 cm   LV e' medial:    4.84 cm/s LV PW:         1.10 cm   LV E/e' medial:  16.5 LV IVS:        1.10 cm   LV e' lateral:   6.30 cm/s LVOT diam:     1.80 cm   LV E/e' lateral: 12.6 LV SV:         79 LV SV Index:   44 LVOT Area:     2.54 cm   RIGHT VENTRICLE             IVC RV Basal diam:  4.20 cm     IVC diam: 1.50 cm RV S prime:     17.80 cm/s TAPSE (M-mode): 2.8 cm  LEFT ATRIUM             Index        RIGHT ATRIUM           Index LA diam:        4.40 cm 2.44 cm/m   RA Area:     12.90 cm LA Vol (A2C):   41.4 ml 22.98 ml/m  RA Volume:   33.10 ml  18.37 ml/m LA Vol (A4C):   54.0 ml 29.97 ml/m LA Biplane Vol: 49.9 ml 27.70 ml/m AORTIC VALVE AV Area (Vmax):    1.39 cm AV Area (Vmean):   1.28 cm AV Area (VTI):     1.30 cm AV Vmax:           272.80 cm/s AV Vmean:          190.800 cm/s AV VTI:            0.611 m AV Peak Grad:      29.8 mmHg AV Mean Grad:      24.0 mmHg LVOT Vmax:         149.00 cm/s LVOT Vmean:        95.650 cm/s LVOT VTI:          0.312 m LVOT/AV VTI ratio: 0.51 AI PHT:  660 msec  AORTA Ao Root diam: 3.20 cm Ao Asc diam:  3.60 cm  MITRAL  VALVE                TRICUSPID VALVE MV Area (PHT): 2.89 cm     TR Peak grad:   34.6 mmHg MV Decel Time: 263 msec     TR Vmax:        294.00 cm/s MV E velocity: 79.70 cm/s MV A velocity: 121.50 cm/s  SHUNTS MV E/A ratio:  0.66         Systemic VTI:  0.31 m Systemic Diam: 1.80 cm  Dalton McleanMD Electronically signed by Ezra Kanner Signature Date/Time: 08/30/2024/2:27:19 PM    Final      CT SCANS  CT CARDIAC SCORING (SELF PAY ONLY) 07/19/2021  Addendum 07/19/2021  3:53 PM ADDENDUM REPORT: 07/19/2021 15:51  CLINICAL DATA:  Cardiovascular Disease Risk stratification  EXAM: Coronary Calcium  Score  TECHNIQUE: A gated, non-contrast computed tomography scan of the heart was performed using 3mm slice thickness. Axial images were analyzed on a dedicated workstation. Calcium  scoring of the coronary arteries was performed using the Agatston method.  FINDINGS: Coronary arteries: Normal origins.  Coronary Calcium  Score:  Left main: 0  Left anterior descending artery: 107  Left circumflex artery: 64.1  Right coronary artery: 5.57  Total: 176  Percentile: 79th  Pericardium: Normal.  Ascending Aorta: Normal caliber.  Aortic atherosclerosis.  Non-cardiac: See separate report from [facility].  Mitral and aortic annular calcification. Mild calcification of the aortic valve leaflet bases.  IMPRESSION: 1. Coronary calcium  score of 176. This was 79th percentile for age-, race-, and sex-matched controls. 2. Mitral and aortic annular calcification. Mild calcification of the aortic valve leaflet bases. 3. Aortic atherosclerosis.  RECOMMENDATIONS: Coronary artery calcium  (CAC) score is a strong predictor of incident coronary heart disease (CHD) and provides predictive information beyond traditional risk factors. CAC scoring is reasonable to use in the decision to withhold, postpone, or initiate statin therapy in intermediate-risk or selected  borderline-risk asymptomatic adults (age 59-75 years and LDL-C >=70 to <190 mg/dL) who do not have diabetes or established atherosclerotic cardiovascular disease (ASCVD).* In intermediate-risk (10-year ASCVD risk >=7.5% to <20%) adults or selected borderline-risk (10-year ASCVD risk >=5% to <7.5%) adults in whom a CAC score is measured for the purpose of making a treatment decision the following recommendations have been made:  If CAC=0, it is reasonable to withhold statin therapy and reassess in 5 to 10 years, as long as higher risk conditions are absent (diabetes mellitus, family history of premature CHD in first degree relatives (males <55 years; females <65 years), cigarette smoking, or LDL >=190 mg/dL).  If CAC is 1 to 99, it is reasonable to initiate statin therapy for patients >=17 years of age.  If CAC is >=100 or >=75th percentile, it is reasonable to initiate statin therapy at any age.  Cardiology referral should be considered for patients with CAC scores >=400 or >=75th percentile.  *2018 AHA/ACC/AACVPR/AAPA/ABC/ACPM/ADA/AGS/APhA/ASPC/NLA/PCNA Guideline on the Management of Blood Cholesterol: A Report of the American College of Cardiology/American Heart Association Task Force on Clinical Practice Guidelines. J Am Coll Cardiol. 2019;73(24):3168-3209.  Vinie Maxcy, MD   Electronically Signed By: Vinie JAYSON Maxcy M.D. On: 07/19/2021 15:51  Narrative EXAM: OVER-READ INTERPRETATION  CT CHEST  The following report is an over-read performed by radiologist Dr. Toribio Aye of [facility] on 07/19/2021. This over-read does not include interpretation of cardiac or coronary anatomy or pathology. The coronary  calcium  score interpretation by the cardiologist is attached.  COMPARISON:  None.  FINDINGS: Atherosclerotic calcifications in the thoracic aorta. Within the visualized portions of the thorax there are no suspicious appearing pulmonary nodules  or masses, there is no acute consolidative airspace disease, no pleural effusions, no pneumothorax and no lymphadenopathy. Visualized portions of the upper abdomen demonstrate a 1 cm low-attenuation lesion in the periphery of segment 8 of the liver, incompletely characterized on today's non-contrast CT examination, but statistically likely to represent a cyst. There are no aggressive appearing lytic or blastic lesions noted in the visualized portions of the skeleton.  IMPRESSION: 1.  Aortic Atherosclerosis (ICD10-I70.0).  Electronically Signed: By: Toribio Aye M.D. On: 07/19/2021 09:10     ______________________________________________________________________________________________      Results   Risk Assessment/Calculations:    {Does this patient have ATRIAL FIBRILLATION?:[phone]} No BP recorded.  {Refresh Note OR Click here to enter BP  :1}***        Physical Exam:    VS:  There were no vitals taken for this visit. ***    Wt Readings from Last 3 Encounters:  11/03/24 157 lb (71.2 kg)  11/02/24 157 lb 3.2 oz (71.3 kg)  10/20/24 154 lb (69.9 kg)     GEN: Well nourished, well developed, in no acute distress NECK: No JVD; No carotid bruits CARDIAC: ***RRR, no murmurs, rubs, gallops RESPIRATORY:  Clear to auscultation without rales, wheezing or rhonchi  ABDOMEN: Soft, non-tender, non-distended, normal bowel sounds EXTREMITIES:  Warm and well perfused, no edema; No deformity, 2+ radial pulses PSYCH: Normal mood and affect   ASSESSMENT AND PLAN: .    #Moderate AS TTE in November  #HLD Lipid panel, lipoprotein a Continue Crestor  20 mg daily Continue Zetia  10 mg daily  #HTN Continue amlodipine  10 mg daily Continue HCTZ 25 mg daily Continue losartan  50 mg daily  #Elevated CAC #Prior CVA       {Are you ordering a CV Procedure (e.g. stress test, cath, DCCV, TEE, etc)?   Press F2        :[id number]}   This note was written with the assistance of a  dictation microphone or AI dictation software. Please excuse any typos or grammatical errors.   Signed, Georganna Archer, MD  11/08/2024 9:33 AM    [facility] "

## 2024-11-09 ENCOUNTER — Other Ambulatory Visit: Payer: Self-pay | Admitting: Physician Assistant

## 2024-11-09 ENCOUNTER — Ambulatory Visit: Admitting: Student in an Organized Health Care Education/Training Program

## 2024-11-12 MED ORDER — LOSARTAN POTASSIUM 50 MG PO TABS: 50.0000 mg | ORAL_TABLET | Freq: Every day | ORAL | 0 refills | Status: AC

## 2024-11-22 ENCOUNTER — Ambulatory Visit: Admitting: Family Medicine

## 2024-12-08 ENCOUNTER — Ambulatory Visit: Admitting: Student in an Organized Health Care Education/Training Program

## 2025-11-09 ENCOUNTER — Ambulatory Visit
